# Patient Record
Sex: Female | Born: 1940 | Race: White | Hispanic: No | Marital: Married | Smoking: Never smoker
Health system: Southern US, Community
[De-identification: ages and names within clinical notes are randomized; demographics above are authoritative.]

## PROBLEM LIST (undated history)

## (undated) DIAGNOSIS — M545 Low back pain, unspecified: Secondary | ICD-10-CM

## (undated) DIAGNOSIS — I10 Essential (primary) hypertension: Secondary | ICD-10-CM

## (undated) DIAGNOSIS — IMO0002 Reserved for concepts with insufficient information to code with codable children: Secondary | ICD-10-CM

## (undated) DIAGNOSIS — E0500 Thyrotoxicosis with diffuse goiter without thyrotoxic crisis or storm: Secondary | ICD-10-CM

## (undated) DIAGNOSIS — IMO0001 Reserved for inherently not codable concepts without codable children: Secondary | ICD-10-CM

## (undated) DIAGNOSIS — M810 Age-related osteoporosis without current pathological fracture: Secondary | ICD-10-CM

## (undated) DIAGNOSIS — E559 Vitamin D deficiency, unspecified: Secondary | ICD-10-CM

## (undated) DIAGNOSIS — C55 Malignant neoplasm of uterus, part unspecified: Secondary | ICD-10-CM

## (undated) DIAGNOSIS — M419 Scoliosis, unspecified: Secondary | ICD-10-CM

## (undated) DIAGNOSIS — I509 Heart failure, unspecified: Secondary | ICD-10-CM

## (undated) HISTORY — DX: Essential (primary) hypertension: I10

## (undated) HISTORY — DX: Low back pain, unspecified: M54.50

## (undated) HISTORY — DX: Vitamin D deficiency, unspecified: E55.9

## (undated) HISTORY — DX: Scoliosis, unspecified: M41.9

## (undated) HISTORY — DX: Thyrotoxicosis with diffuse goiter without thyrotoxic crisis or storm: E05.00

## (undated) HISTORY — PX: BUNIONECTOMY: SHX129

## (undated) HISTORY — PX: JOINT REPLACEMENT: SHX530

## (undated) HISTORY — DX: Age-related osteoporosis without current pathological fracture: M81.0

## (undated) HISTORY — DX: Malignant neoplasm of uterus, part unspecified: C55

## (undated) HISTORY — PX: TOTAL KNEE ARTHROPLASTY: SHX125

## (undated) HISTORY — PX: CATARACT EXTRACTION, BILATERAL: SHX1313

---
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
## 2002-08-03 HISTORY — PX: FOOT SURGERY: SHX648

## 2003-04-14 ENCOUNTER — Encounter: Payer: Self-pay | Admitting: Emergency Medicine

## 2003-04-14 ENCOUNTER — Emergency Department (HOSPITAL_COMMUNITY): Admission: EM | Admit: 2003-04-14 | Discharge: 2003-04-14 | Payer: Self-pay | Admitting: Emergency Medicine

## 2007-07-21 ENCOUNTER — Other Ambulatory Visit: Admission: RE | Admit: 2007-07-21 | Discharge: 2007-07-21 | Payer: Self-pay | Admitting: Internal Medicine

## 2007-08-18 ENCOUNTER — Encounter: Admission: RE | Admit: 2007-08-18 | Discharge: 2007-08-18 | Payer: Self-pay | Admitting: Internal Medicine

## 2007-11-08 ENCOUNTER — Ambulatory Visit (HOSPITAL_COMMUNITY): Admission: RE | Admit: 2007-11-08 | Discharge: 2007-11-08 | Payer: Self-pay | Admitting: Family Medicine

## 2008-02-09 ENCOUNTER — Ambulatory Visit (HOSPITAL_BASED_OUTPATIENT_CLINIC_OR_DEPARTMENT_OTHER): Admission: RE | Admit: 2008-02-09 | Discharge: 2008-02-09 | Payer: Self-pay | Admitting: Orthopedic Surgery

## 2008-09-12 ENCOUNTER — Encounter: Admission: RE | Admit: 2008-09-12 | Discharge: 2008-09-12 | Payer: Self-pay | Admitting: Internal Medicine

## 2009-04-12 ENCOUNTER — Inpatient Hospital Stay (HOSPITAL_COMMUNITY): Admission: RE | Admit: 2009-04-12 | Discharge: 2009-04-15 | Payer: Self-pay | Admitting: Orthopedic Surgery

## 2009-10-01 ENCOUNTER — Encounter: Admission: RE | Admit: 2009-10-01 | Discharge: 2009-10-01 | Payer: Self-pay | Admitting: Internal Medicine

## 2010-08-03 DIAGNOSIS — J189 Pneumonia, unspecified organism: Secondary | ICD-10-CM

## 2010-08-03 HISTORY — DX: Pneumonia, unspecified organism: J18.9

## 2010-11-07 LAB — BASIC METABOLIC PANEL
BUN: 6 mg/dL (ref 6–23)
Calcium: 8.2 mg/dL — ABNORMAL LOW (ref 8.4–10.5)
Calcium: 8.2 mg/dL — ABNORMAL LOW (ref 8.4–10.5)
Creatinine, Ser: 0.77 mg/dL (ref 0.4–1.2)
GFR calc Af Amer: >60 mL/min (ref >60)
GFR calc non Af Amer: >60 mL/min (ref >60)
GFR calc non Af Amer: >60 mL/min (ref >60)
Glucose, Bld: 127 mg/dL — ABNORMAL HIGH (ref 70–99)
Sodium: 136 mEq/L (ref 135–145)

## 2010-11-07 LAB — CBC
HCT: 27.2 % — ABNORMAL LOW (ref 36.0–46.0)
Hemoglobin: 10.5 g/dL — ABNORMAL LOW (ref 12.0–15.0)
MCHC: 33.9 g/dL (ref 30.0–36.0)
MCV: 94.5 fL (ref 78.0–100.0)
Platelets: 159 10*3/uL (ref 150–400)
Platelets: 191 10*3/uL (ref 150–400)
Platelets: 202 10*3/uL (ref 150–400)
RBC: 3.24 MIL/uL — ABNORMAL LOW (ref 3.87–5.11)
RDW: 12.9 % (ref 11.5–15.5)
WBC: 11 10*3/uL — ABNORMAL HIGH (ref 4.0–10.5)
WBC: 11.2 10*3/uL — ABNORMAL HIGH (ref 4.0–10.5)
WBC: 8 10*3/uL (ref 4.0–10.5)

## 2010-11-07 LAB — URINALYSIS, ROUTINE W REFLEX MICROSCOPIC
Hgb urine dipstick: NEGATIVE
Specific Gravity, Urine: 1.01 (ref 1.005–1.030)
Urobilinogen, UA: 0.2 mg/dL (ref 0.0–1.0)

## 2010-11-07 LAB — PROTIME-INR
INR: 1.5 (ref 0.00–1.49)
INR: 1.6 — ABNORMAL HIGH (ref 0.00–1.49)
Prothrombin Time: 14.5 seconds (ref 11.6–15.2)
Prothrombin Time: 19 seconds — ABNORMAL HIGH (ref 11.6–15.2)

## 2010-11-07 LAB — COMPREHENSIVE METABOLIC PANEL
AST: 21 U/L (ref 0–37)
Albumin: 3.6 g/dL (ref 3.5–5.2)
Calcium: 9.5 mg/dL (ref 8.4–10.5)
Chloride: 105 mEq/L (ref 96–112)
Creatinine, Ser: 0.79 mg/dL (ref 0.4–1.2)
GFR calc Af Amer: >60 mL/min (ref >60)
Sodium: 142 mEq/L (ref 135–145)

## 2010-11-07 LAB — APTT: aPTT: 27 seconds (ref 24–37)

## 2010-11-07 LAB — ABO/RH: ABO/RH(D): O POS

## 2010-11-07 LAB — TYPE AND SCREEN: Antibody Screen: NEGATIVE

## 2010-11-07 LAB — URINE MICROSCOPIC-ADD ON

## 2010-11-24 ENCOUNTER — Other Ambulatory Visit: Payer: Self-pay | Admitting: Internal Medicine

## 2010-11-24 DIAGNOSIS — Z1231 Encounter for screening mammogram for malignant neoplasm of breast: Secondary | ICD-10-CM

## 2010-12-03 ENCOUNTER — Ambulatory Visit
Admission: RE | Admit: 2010-12-03 | Discharge: 2010-12-03 | Disposition: A | Discharge status: Home / Self Care | Payer: BC Managed Care – PPO | Source: Ambulatory Visit | Attending: Internal Medicine | Admitting: Internal Medicine

## 2010-12-03 DIAGNOSIS — Z1231 Encounter for screening mammogram for malignant neoplasm of breast: Secondary | ICD-10-CM

## 2010-12-16 NOTE — Op Note (Signed)
NAME:  Terri Ortega, Terri Ortega               ACCOUNT NO.:  [id number]   MEDICAL RECORD NO.:  [id number]          PATIENT TYPE:  AMB   LOCATION:  [facility]                         FACILITY:  [facility]   PHYSICIAN:  Deidre Ala, M.D.    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 Degenerative medial meniscus tear, grade 3-4, unstable with flap      mid substance.  2. Degenerative lateral meniscus tear.  3. 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  4. Tight lateral retinaculum.  5. Medial and lateral plicas.   PROCEDURES:  1. Right knee arthroscopy with subtotal medial meniscectomy.  2. Partial lateral meniscectomy.  3. Abrasion and ablation chondroplasty, tricompartmental.  4. Lateral retinacular release.  5. Medial and lateral plica excision.   SURGEON:  1. Charlesetta Shanks, M.D.   ASSISTANT:  Phineas Semen, P.A.   ANESTHESIA:  General with LMA.   CULTURES:  None.   DRAINS:  None.   BLOOD LOSS:  Minimal.   TOURNIQUET TIME:  47 minutes.   PATHOLOGIC FINDINGS AND HISTORY:  [name] was sent for consultation by  Dr. Althea Charon in our office for chronic right knee pain, some catching  and giving way.  She had evidence of lateral degeneration of the patella  on sunrise view.  The MRI showed a horizontal tear of the anterior horn  lateral meniscus, tear of the posterior horn medial meniscus, and  patellar chondromalacia.  We talked about various options for treatment.  She was having mechanical symptoms, so it was elected to proceed with  surgical intervention with arthroscopy.  At surgery, she had a huge  medial plica.  The entire trochlea was involved with grade 2-3 changes.  The entire patella was grade 3-4.  She had a tight lateral retinaculum.  She had a flipped-in flap of a medial meniscus tear that was multiple  tear configuration stellate from front one-third to back with DJD grade  3-4 on the medial tibial plateau and medial femoral condyle.  All of  this was debrided and smoothed.  The ACL was intact.  The lateral  meniscus had marked degenerative fraying and anterior horn tearing with  some horizontal tearing in the mid substance that we debrided back to a  stable rim from front to back and debrided the lateral compartment using  the ablator on one to smooth.  She had a tight lateral retinaculum with  lateral patellar tilt and track, and this was where a lot of her wear  was in the trochlea and posterior patella.  This released nicely with  improved tilt and track.   DESCRIPTION OF PROCEDURE:  With adequate anesthesia obtained using LMA  technique, 1 gram Ancef given IV prophylaxis, the patient was placed in  the supine position.  The right lower extremity was prepped from the  malleoli to the leg-holder in the standard fashion.  After standard  prepping and draping, Esmarch examination was used.  The tourniquet was  let up to 350 mmHg.  Superior and lateral inflow portals were made.  The  knee was insufflated with normal saline with an arthroscopic pump.  Medial and lateral scope portals were then made and the joint was  thoroughly inspected.  I then shaved out the medial plica back to the  sidewall and lysed the medial band.  I then shaved and smoothed with the  ablator on one the trochlea and most of the posterior patella.  I then  exposed the medial meniscus which I probed, flipped in the flipped-out  tear that was flipped over the meniscus back into the joint, and  completed with basket and shaver from various angles meniscectomy from  the anterior one-third to the far posterior horn, leaving some posterior  horn but excising most of the meniscus in the middle one-third using the  ablator on one to smooth as well as  shaving the medial femoral condyle,  medial tibial plateau, and using the ablator on one to smooth.  The  lateral compartment was then smoothed and shaved with basket and shaver.  We then sealed the inner rim lateral meniscus and the anterior horn and  shaved and smoothed with the ablator over the lateral femoral condyle  and lateral tibial plateau.  I then shaved out the lateral synovitis and  did an arthroscopic lateral retinacular release from the vastus  lateralis to the joint line with improvement in tilt and track with  bleeding points cauterized.  The knee was irrigated through the scope.  0.5% Marcaine with morphine was injected in and about the portals.  The  portals were left open.  A bulky sterile compressive dressing was  applied with lateral foam pad for tamponade and an EZ wrap placed.  The  patient then, having tolerated the procedure well, was awakened and  taken to the recovery room in satisfactory condition to be discharged  per outpatient routine, given Percocet for pain, and told to call the  office for a recheck tomorrow.           ______________________________  V. Charlesetta Shanks, M.D.     VEP/MEDQ  D:  02/09/2008  T:  02/09/2008  Job:  [id number]   cc:   Deidre Ala, M.D.  Fax: [phone]   Otho Darner, M.D.  Fax: [phone]

## 2011-04-30 LAB — POCT HEMOGLOBIN-HEMACUE
Hemoglobin: 13.2
Operator id: 268271

## 2012-01-04 ENCOUNTER — Other Ambulatory Visit: Payer: Self-pay | Admitting: Internal Medicine

## 2012-01-04 DIAGNOSIS — Z1231 Encounter for screening mammogram for malignant neoplasm of breast: Secondary | ICD-10-CM

## 2012-01-18 ENCOUNTER — Ambulatory Visit
Admission: RE | Admit: 2012-01-18 | Discharge: 2012-01-18 | Disposition: A | Discharge status: Home / Self Care | Payer: Medicare Other | Source: Ambulatory Visit | Attending: Internal Medicine | Admitting: Internal Medicine

## 2012-01-18 DIAGNOSIS — Z1231 Encounter for screening mammogram for malignant neoplasm of breast: Secondary | ICD-10-CM

## 2012-01-21 ENCOUNTER — Other Ambulatory Visit: Payer: Self-pay | Admitting: Internal Medicine

## 2012-01-21 DIAGNOSIS — R928 Other abnormal and inconclusive findings on diagnostic imaging of breast: Secondary | ICD-10-CM

## 2013-01-16 ENCOUNTER — Other Ambulatory Visit: Payer: Self-pay

## 2013-01-16 DIAGNOSIS — Z1231 Encounter for screening mammogram for malignant neoplasm of breast: Secondary | ICD-10-CM

## 2013-02-21 ENCOUNTER — Ambulatory Visit
Admission: RE | Admit: 2013-02-21 | Discharge: 2013-02-21 | Disposition: A | Discharge status: Home / Self Care | Payer: Medicare Other | Source: Ambulatory Visit

## 2013-02-21 DIAGNOSIS — Z1231 Encounter for screening mammogram for malignant neoplasm of breast: Secondary | ICD-10-CM

## 2013-12-12 ENCOUNTER — Other Ambulatory Visit (HOSPITAL_COMMUNITY): Payer: Self-pay | Admitting: Internal Medicine

## 2013-12-12 ENCOUNTER — Ambulatory Visit (HOSPITAL_COMMUNITY): Payer: Medicare Other | Attending: Cardiology | Admitting: Radiology

## 2013-12-12 DIAGNOSIS — R0602 Shortness of breath: Secondary | ICD-10-CM

## 2013-12-12 DIAGNOSIS — R0609 Other forms of dyspnea: Secondary | ICD-10-CM | POA: Insufficient documentation

## 2013-12-12 DIAGNOSIS — R0989 Other specified symptoms and signs involving the circulatory and respiratory systems: Principal | ICD-10-CM | POA: Insufficient documentation

## 2013-12-12 NOTE — Progress Notes (Signed)
Echocardiogram performed.  

## 2013-12-13 ENCOUNTER — Encounter: Payer: Self-pay | Admitting: Sports Medicine

## 2013-12-13 ENCOUNTER — Ambulatory Visit (INDEPENDENT_AMBULATORY_CARE_PROVIDER_SITE_OTHER): Payer: Medicare Other | Admitting: Sports Medicine

## 2013-12-13 VITALS — BP 124/81

## 2013-12-13 DIAGNOSIS — M5430 Sciatica, unspecified side: Secondary | ICD-10-CM

## 2013-12-13 DIAGNOSIS — M5137 Other intervertebral disc degeneration, lumbosacral region: Secondary | ICD-10-CM | POA: Insufficient documentation

## 2013-12-13 DIAGNOSIS — M5441 Lumbago with sciatica, right side: Secondary | ICD-10-CM

## 2013-12-13 MED ORDER — TRAMADOL HCL 50 MG PO TABS: 50.0000 mg | ORAL_TABLET | Freq: Two times a day (BID) | ORAL | Status: DC

## 2013-12-13 MED ORDER — GABAPENTIN 100 MG PO CAPS: ORAL_CAPSULE | ORAL | Status: DC

## 2013-12-13 NOTE — Assessment & Plan Note (Signed)
Limited stability w walking Suggested tring a cane  Work on chair rehab stretches  Demonstrated to patient Work on easy back motion  I would like to review MRI and spine films but I do not think this is surgical

## 2013-12-13 NOTE — Assessment & Plan Note (Signed)
We will treat pain with tramadol Trial of low dose gabapentin for sciatic sxs Cautions advised  OK to continue some aleve  Reck p 8 wks

## 2013-12-13 NOTE — Progress Notes (Signed)
Patient ID: Terri Ortega, female   DOB: 1941-02-17, 73 y.o.   MRN: [id number]  Patient with 6 to 8 mos of LBP She comes with her daughter on the advice of Dr. Harrington Challenger a family physician friend Radiation into RT leg ant and post down past knee Hx of RT TKR in 2010 Also arthroscopy She has had some significant evaluations and treatment at orthopedics and neurosurgery  Found to have scoliosis Has seen NS and had back injections First CSI did not help much Second 1 helped for 1 month IM CSI helped minimally  Has daily pain into RT leg More along anterior thigh and into buttocks No Hx of DM or other major medical issues  Takes syndthroid  Exam Pleasant older F in NAD BP 124/81  Walks with obvious limp and weakness on left hip flexion and leg extension Weakness to testing of hip flexion and hip abduction on RT Mils quad weakness Good strenth on PF, inversion, great toe motions  Standing has scoliosis in lower thoracic and lumbar region Lateral bend painful to RT/ tight to left Forward flexion is normal Extension is limited Rotation causes pain on RT  No MM spasm noted in low back

## 2013-12-13 NOTE — Patient Instructions (Signed)
I think you need to take tramadol for pain twice daily Primary side effect with medicine might be constipation so take with plenty of water This is for arthritis pain and over the long run is safer than Aleve  OK to use some aleve  For nerve irritation we will start very low dose gabapentin 100 mgm at night for 1 week 200 mgm at night for 2 week Then 300 mgm at night until you see me  This could make you drowsy or felt drunk so be careful if you get up in night  Exercise  Chair stretches  Gym exercise particularly the stationary bike  See me again in 6 weeks

## 2013-12-26 ENCOUNTER — Other Ambulatory Visit: Payer: Self-pay | Admitting: *Deleted

## 2013-12-26 MED ORDER — GABAPENTIN 100 MG PO CAPS: ORAL_CAPSULE | ORAL | Status: DC

## 2013-12-29 ENCOUNTER — Other Ambulatory Visit: Payer: Self-pay | Admitting: Obstetrics & Gynecology

## 2014-01-09 ENCOUNTER — Encounter: Payer: Self-pay | Admitting: *Deleted

## 2014-01-11 ENCOUNTER — Encounter: Payer: Self-pay | Admitting: Cardiology

## 2014-01-11 ENCOUNTER — Ambulatory Visit (INDEPENDENT_AMBULATORY_CARE_PROVIDER_SITE_OTHER): Payer: Medicare Other | Admitting: Cardiology

## 2014-01-11 VITALS — BP 132/70 | HR 64 | Ht 65.0 in | Wt 178.5 lb

## 2014-01-11 DIAGNOSIS — R0600 Dyspnea, unspecified: Secondary | ICD-10-CM

## 2014-01-11 DIAGNOSIS — I10 Essential (primary) hypertension: Secondary | ICD-10-CM

## 2014-01-11 DIAGNOSIS — R0989 Other specified symptoms and signs involving the circulatory and respiratory systems: Secondary | ICD-10-CM

## 2014-01-11 DIAGNOSIS — R609 Edema, unspecified: Secondary | ICD-10-CM

## 2014-01-11 DIAGNOSIS — R0609 Other forms of dyspnea: Secondary | ICD-10-CM

## 2014-01-11 NOTE — Patient Instructions (Signed)
Your physician wants you to follow-up in: [place] DR. HOCHREIN You will receive a reminder letter in the mail two months in advance. If you don't receive a letter, please call our office to schedule the follow-up appointment.  Your physician recommends that you continue on your current medications as directed. Please refer to the Current Medication list given to you today.

## 2014-01-11 NOTE — Progress Notes (Signed)
HPI The patient presents for evaluation of dyspnea and edema. She has no prior cardiac history. This may she did have some chest discomfort. She is somewhat vague in describing this. There was some shortness of breath. As a bandlike discomfort that was mild. She did not describe associated symptoms such as nausea vomiting or diaphoresis. She's not able to reproduce this. She has had some mild increased dyspnea but she's able to do activities such as vacuuming without bringing this on. She had some mild lower externally swelling. She did get a BNP which was slightly elevated. She was sent for an echocardiogram and I reviewed these results. The EF was well-preserved although there was some evidence mild diastolic dysfunction. There was also mild aortic valve thickening with some aortic insufficiency. She does not describe PND or orthopnea. She did not have neck discomfort or arm discomfort. He does remain active typically achieving greater than 5 METS.  Of note the patient was started on diuretic and she did have improvement in symptoms reduced ankle edema and her BNP normalized.  No Known Allergies  Current Outpatient Prescriptions  Medication Sig Dispense Refill  . furosemide (LASIX) 40 MG tablet Take 20 mg by mouth daily.       Marland Kitchen gabapentin (NEURONTIN) 100 MG capsule Take 1 tablet qhs x 1 week then increase to 2 qhs x 1 week then 3 qhs x 1 week  90 capsule  3  . potassium chloride (K-DUR) 10 MEQ tablet Take 10 mEq by mouth every other day.       . raloxifene (EVISTA) 60 MG tablet       . SYNTHROID 112 MCG tablet       . Vitamin D, Ergocalciferol, (DRISDOL) 50000 UNITS CAPS capsule Take 2,000 Units by mouth daily.        No current facility-administered medications for this visit.    Past Medical History  Diagnosis Date  . HTN (hypertension)   . Hypothyroid   . Graves disease   . Osteoporosis   . Uterine cancer   . Scoliosis     Past Surgical History  Procedure Laterality Date  .  Total knee arthroplasty      right  . Bunionectomy      left    No family history on file.  History   Social History  . Marital Status: Married    Spouse Name: N/A    Number of Children: N/A  . Years of Education: N/A   Occupational History  . Not on file.   Social History Main Topics  . Smoking status: Never Smoker   . Smokeless tobacco: Not on file  . Alcohol Use: Not on file  . Drug Use: Not on file  . Sexual Activity: Not on file   Other Topics Concern  . Not on file   Social History Narrative  . No narrative on file     ROS:   Positive for recent GI bleeding. Positive for joint pains. Positive for ankle swelling and low back pain. Otherwise, as stated in the HPI and negative for all other systems.  PHYSICAL EXAM BP 132/70  Pulse 64  Ht 5\' 5"  (1.651 m)  Wt 178 lb 8 oz (80.967 kg)  BMI 29.70 kg/m2  SpO2 97% GENERAL:  Well appearing HEENT:  Pupils equal round and reactive, fundi not visualized, oral mucosa unremarkable NECK:  No jugular venous distention, waveform within normal limits, carotid upstroke brisk and symmetric, no bruits, no thyromegaly LYMPHATICS:  No  cervical, inguinal adenopathy LUNGS:  Clear to auscultation bilaterally BACK:  No CVA tenderness CHEST:  Unremarkable HEART:  PMI not displaced or sustained,S1 and S2 within normal limits, no S3, no S4, no clicks, no rubs, soft apical systolic murmur, no diastolic murmurs ABD:  Flat, positive bowel sounds normal in frequency in pitch, no bruits, no rebound, no guarding, no midline pulsatile mass, no hepatomegaly, no splenomegaly EXT:  2 plus pulses throughout, no edema, no cyanosis no clubbing SKIN:  No rashes no nodules NEURO:  Cranial nerves II through XII grossly intact, motor grossly intact throughout PSYCH:  Cognitively intact, oriented to person place and time  EKG:  Normal sinus rhythm, rate 71, left axis deviation, left anterior fascicular block, poor anterior R wave progression, premature  ectopic complexes, nonspecific diffuse T wave flattening, QTC slightly prolonged. 01/11/2014  ASSESSMENT AND PLAN  DYSPNEA:  There probably is some element of mild diastolic dysfunction. However, her mild valvular disease does not require further treatment. She needs good blood pressure control and we discussed at length salt restriction. I would not see any evidence of an anginal equivalent.  For now she can continue low-dose diuretic. She may be able to back off on this and should reduce his salt.  HTN:  The blood pressure is at target. No change in medications is indicated. We will continue with therapeutic lifestyle changes (TLC).  AORTIC VALVE DISEASE:  This is mild. I would likely repeat an echocardiogram in one year.

## 2014-01-15 ENCOUNTER — Telehealth: Payer: Self-pay | Admitting: Cardiology

## 2014-01-15 NOTE — Telephone Encounter (Signed)
New Message  Terri Ortega with [facility] called states the Pt will have a hysterectomy on june 30th// She says that there was an ECHO completed on May 29 that was normal. Please confirm cardiac clearance if possible for this pt//

## 2014-01-15 NOTE — Telephone Encounter (Signed)
Will forward to Dr Hochrein for review and clearance 

## 2014-01-17 NOTE — Telephone Encounter (Signed)
The patient has a high functional level.  She has no high risk findings or symptoms.  She is not going for a high risk procedure from a cardiovascular standpoint.  Therefore, based on ACC/AHA guidelines, the patient would be at acceptable risk for the planned procedure without further cardiovascular testing.

## 2014-01-18 NOTE — Telephone Encounter (Signed)
leftmessage for Gillian Scarce to return the call.  I need a fax number to send clearance to.

## 2014-01-24 ENCOUNTER — Encounter: Payer: Self-pay | Admitting: Sports Medicine

## 2014-01-24 ENCOUNTER — Ambulatory Visit (INDEPENDENT_AMBULATORY_CARE_PROVIDER_SITE_OTHER): Payer: Medicare Other | Admitting: Sports Medicine

## 2014-01-24 VITALS — BP 109/67 | Ht 64.0 in | Wt 179.6 lb

## 2014-01-24 DIAGNOSIS — M5441 Lumbago with sciatica, right side: Secondary | ICD-10-CM

## 2014-01-24 DIAGNOSIS — M5430 Sciatica, unspecified side: Secondary | ICD-10-CM

## 2014-01-24 MED ORDER — GABAPENTIN 100 MG PO CAPS: 300.0000 mg | ORAL_CAPSULE | Freq: Every day | ORAL | Status: DC

## 2014-01-24 NOTE — Progress Notes (Signed)
CC: Followup low back pain and right-sided sciatica HPI: Patient returns for followup today. She states that her left leg symptoms are less prominent. She is not really getting any radicular type pain down her right leg anymore she is taking the gabapentin 300 mg each bedtime. She did not tolerate the tramadol do to making her feel very sleepy. She continues to complain of pain right in the middle of her back in about L5-S1. She states that this is worse for the first couple minutes after she gets out of a chair and for about 30 minutes in the morning until she really gets moving. She is doing some stretches that she was given by Dr. Oneida Alar previously.  ROS: As above in the HPI. All other systems are stable or negative.  OBJECTIVE: APPEARANCE:  Patient in no acute distress.The patient appeared well nourished and normally developed. HEENT: No scleral icterus. Conjunctiva non-injected Resp: Non labored Skin: No rash MSK:  Low back: - Tenderness to palpation about L5-S1 - Strength equal and symmetric 5 out of 5 - Sensation intact to light touch with the exception of a small area at the superolateral aspect of the right knee but is normal and symmetric above and beyond this small area - She is noted to have some difficulty getting out of the chair and some gait instability with first getting started - Leg length equal on measurement - With gait evaluation and walking she is noted to have a Trendelenburg gait. Pelvis shifts significantly to the right. Left leg appears short  MSK [place]: Not performed   ASSESSMENT: #1. Low back pain, suspect largely axial and arthritic at this time with improved control of radicular features on the gabapentin   PLAN: #1. Heel lift with medial wedge added to left shoe with improvement in Trendelenburg gait #2. Continue gabapentin 300 mg each bedtime #3. Okay to take Aleve. Monitor blood pressure about 3 times per week. See if there are any alternative blood  pressure medications that might be more compliant with the Aleve. Monitor for stomach irritation #4. Try one half tramadol to see if this is less sedating.  Followup one month.

## 2014-01-24 NOTE — Patient Instructions (Addendum)
Thank you for coming in today  1. Try heel lift in left shoe. Thicker side goes to inside of shoe. 2. Continue gabapentin 300 mg at night 3. Okay to use Aleve. Get a blood pressure cuff and monitor 3x per week. Talk to your doctor about whether you need lasix or there might be an alternative so that you could use aleve. Monitor for stomach irritation. 4. Try 1/2 tramadol if you're hurting.  Followup in 1 month

## 2014-01-25 ENCOUNTER — Telehealth: Payer: Self-pay | Admitting: Cardiology

## 2014-01-25 NOTE — Telephone Encounter (Signed)
Gillian Scarce called back with fax # for surgical clearance [phone].

## 2014-01-26 NOTE — Telephone Encounter (Signed)
Faxed as requested

## 2014-01-29 ENCOUNTER — Telehealth: Payer: Self-pay | Admitting: Cardiology

## 2014-01-29 NOTE — Telephone Encounter (Signed)
Will fax to Dr Alycia Rossetti [phone]

## 2014-01-29 NOTE — Telephone Encounter (Signed)
Patient is inquiring about the letter we were going to fax to Dr. Alycia Rossetti in [facility]  Fax # [phone]-- clearing this patient for surgery.  Surgery is scheduled for tomorrow 01/30/14.  Has this been completed?  Please confirm with patient.

## 2014-01-30 DIAGNOSIS — C541 Malignant neoplasm of endometrium: Secondary | ICD-10-CM | POA: Insufficient documentation

## 2014-01-30 HISTORY — PX: ROBOTIC ASSISTED TOTAL HYSTERECTOMY WITH BILATERAL SALPINGO OOPHERECTOMY: SHX6086

## 2014-02-01 ENCOUNTER — Ambulatory Visit: Payer: Medicare Other | Admitting: Gynecologic Oncology

## 2014-02-13 ENCOUNTER — Other Ambulatory Visit: Payer: Self-pay | Admitting: *Deleted

## 2014-02-13 DIAGNOSIS — N950 Postmenopausal bleeding: Secondary | ICD-10-CM

## 2014-02-23 ENCOUNTER — Encounter: Payer: Self-pay | Admitting: Radiation Oncology

## 2014-02-23 NOTE — Progress Notes (Signed)
GYN Location of Tumor / Histology: Grade 1 endometrial cancer  Terri Ortega presented with symptoms of: post menopausal bleeding  Biopsies revealed:  01/30/14    Past/Anticipated interventions by Gyn/Onc surgery, if any: 01/30/14 - ROBOTIC ASSISTED TOTAL HYSTERECTOMY WITH BILATERAL SALPINGO OOPHERECTOMY by Dr. Alycia Rossetti  Past/Anticipated interventions by medical oncology, if any: no  Weight changes, if any: has lost 2 lbs due to nausea  Bowel/Bladder complaints, if any: no bowel issues, reports urinary frequency especially in the mornings.  Usually gets up once per night to urinate.  Nausea/Vomiting, if any:  Has occasional nausea.  Not taking any medication for it.  Pain issues, if any:  Has occasional shooting pain in her lower abdomin and left breast.  SAFETY ISSUES:  Prior radiation? no  Pacemaker/ICD? no  Possible current pregnancy? no  Is the patient on methotrexate? no  Current Complaints / other details:  Patient is here with her daughter.  She has 2 children - 1 deceased.  She is a retired Web designer.

## 2014-02-28 ENCOUNTER — Ambulatory Visit
Admission: RE | Admit: 2014-02-28 | Discharge: 2014-02-28 | Disposition: A | Discharge status: Home / Self Care | Payer: Medicare Other | Source: Ambulatory Visit | Attending: Radiation Oncology | Admitting: Radiation Oncology

## 2014-02-28 ENCOUNTER — Ambulatory Visit: Payer: Medicare Other | Admitting: Sports Medicine

## 2014-02-28 ENCOUNTER — Encounter: Payer: Self-pay | Admitting: Radiation Oncology

## 2014-02-28 VITALS — BP 118/91 | HR 72 | Temp 97.9°F | Ht 64.0 in | Wt 172.3 lb

## 2014-02-28 DIAGNOSIS — Z96659 Presence of unspecified artificial knee joint: Secondary | ICD-10-CM | POA: Insufficient documentation

## 2014-02-28 DIAGNOSIS — M810 Age-related osteoporosis without current pathological fracture: Secondary | ICD-10-CM | POA: Diagnosis not present

## 2014-02-28 DIAGNOSIS — Z90710 Acquired absence of both cervix and uterus: Secondary | ICD-10-CM | POA: Diagnosis not present

## 2014-02-28 DIAGNOSIS — C549 Malignant neoplasm of corpus uteri, unspecified: Secondary | ICD-10-CM | POA: Insufficient documentation

## 2014-02-28 DIAGNOSIS — I10 Essential (primary) hypertension: Secondary | ICD-10-CM | POA: Diagnosis not present

## 2014-02-28 DIAGNOSIS — C541 Malignant neoplasm of endometrium: Secondary | ICD-10-CM

## 2014-02-28 DIAGNOSIS — Z791 Long term (current) use of non-steroidal anti-inflammatories (NSAID): Secondary | ICD-10-CM | POA: Insufficient documentation

## 2014-02-28 DIAGNOSIS — Z7982 Long term (current) use of aspirin: Secondary | ICD-10-CM | POA: Diagnosis not present

## 2014-02-28 DIAGNOSIS — Z510 Encounter for antineoplastic radiation therapy: Secondary | ICD-10-CM | POA: Diagnosis present

## 2014-02-28 DIAGNOSIS — E039 Hypothyroidism, unspecified: Secondary | ICD-10-CM | POA: Insufficient documentation

## 2014-02-28 DIAGNOSIS — M4120 Other idiopathic scoliosis, site unspecified: Secondary | ICD-10-CM | POA: Diagnosis not present

## 2014-02-28 DIAGNOSIS — Z9079 Acquired absence of other genital organ(s): Secondary | ICD-10-CM | POA: Diagnosis not present

## 2014-02-28 NOTE — Progress Notes (Signed)
Please see the Nurse Progress Note in the MD Initial Consult Encounter for this patient. 

## 2014-02-28 NOTE — Progress Notes (Signed)
Radiation Oncology         [phone]) [phone] ________________________________  Initial outpatient Consultation  Name: Terri Ortega MRN: [id number]  Date: 02/28/2014  DOB: 1941/06/09  DP:OEUMPN,TIRWER D, MD  Polite, Ashby Dawes, MD   REFERRING PHYSICIAN: Polite, Ashby Dawes, MD  DIAGNOSIS: Stage IB, grade 1 endometrioid adenocarcinoma  HISTORY OF PRESENT ILLNESS:Terri Ortega is a 73 y.o. female who is who is seen out courtesy of Dr. Alycia Rossetti for an opinion concerning radiation therapy as part of management of patient's recently diagnosed endometrial cancer. Patient presented earlier this year with postmenopausal vaginal bleeding. She was seen by Dr. Dory Horn and curettage revealed endometrial adenocarcinoma. The patient was referred to Dr. Alycia Rossetti. Patient was felt to be a good candidate for robotic laparoscopy, surgical, with total hysterectomy with lymph node sampling and dissection. Due to to scheduling issues this surgery was performed at [facility] on June 5. The patient was found to have a FIGO grade 1 endometrioid adenocarcinoma, tumor was 3.2 cm in greatest dimension. The tumor did extend into the outer half of the myometrium for penetration of 13 mm and wall thickness 20 mm (65%). There was no cervical involvement or serosal involvement. The surgical margins were clear. No lymphovascular space invasion was noted. A total of 14 lymph nodes from the pelvis and periaortic area showed no evidence of metastasis. Patient is been doing well since her surgery. She was presented at the multidisciplinary gynecological oncology conference at [facility] July 8. Recommendations were for adjuvant vaginal brachytherapy. The patient does live in the [place] area and has been referred to our facility for treatment.  PREVIOUS RADIATION THERAPY: No  PAST MEDICAL HISTORY:  has a past medical history of HTN (hypertension); Hypothyroid; Graves disease; Osteoporosis; Uterine cancer; and Scoliosis.     PAST SURGICAL HISTORY: Past Surgical History  Procedure Laterality Date  . Total knee arthroplasty      right  . Bunionectomy      left  . Robotic assisted total hysterectomy with bilateral salpingo oopherectomy  01/30/14    with lymph node biopsy    FAMILY HISTORY: family history is not on file.  SOCIAL HISTORY:  reports that she has never smoked. She does not have any smokeless tobacco history on file.  ALLERGIES: Review of patient's allergies indicates no known allergies.  MEDICATIONS:  Current Outpatient Prescriptions  Medication Sig Dispense Refill  . aspirin EC 81 MG tablet Take 81 mg by mouth daily.       . Cholecalciferol (VITAMIN D3) 2000 UNITS capsule Take by mouth daily.       . furosemide (LASIX) 40 MG tablet Take 20 mg by mouth daily.       Marland Kitchen levothyroxine (SYNTHROID) 112 MCG tablet Take by mouth daily before breakfast.       . naproxen sodium (ANAPROX) 220 MG tablet Take 220 mg by mouth 2 (two) times daily with a meal.      . Omega-3 1000 MG CAPS Take by mouth.      . potassium chloride (K-DUR) 10 MEQ tablet Take 10 mEq by mouth every other day.       . raloxifene (EVISTA) 60 MG tablet daily.        No current facility-administered medications for this encounter.    REVIEW OF SYSTEMS:  A 15 point review of systems is documented in the electronic medical record. This was obtained by the nursing staff. However, I reviewed this with the patient to discuss relevant  findings and make appropriate changes. The patient presented with vaginal bleeding as above. At times this was heavy. She denies any pelvic pain, urination symptoms or bowel complaints. She denies any new problems since her surgery.   PHYSICAL EXAM:  height is 5\' 4"  (1.626 m) and weight is 172 lb 4.8 oz (78.155 kg). Her oral temperature is 97.9 F (36.6 C). Her blood pressure is 118/91 and her pulse is 72.   BP 118/91  Pulse 72  Temp(Src) 97.9 F (36.6 C) (Oral)  Ht 5\' 4"  (1.626 m)  Wt 172 lb 4.8 oz  (78.155 kg)  BMI 29.56 kg/m2  General Appearance:    Alert, cooperative, no distress, appears stated age, accompanied by a daughter on evaluation today   Head:    Normocephalic, without obvious abnormality, atraumatic  Eyes:    PERRL, conjunctiva/corneas clear, EOM's intact,      Ears:    Normal TM's and external ear canals, both ears  Nose:   Nares normal, septum midline, mucosa normal, no drainage    or sinus tenderness  Throat:   Lips, mucosa, and tongue normal; teeth and gums normal  Neck:   Supple, symmetrical, trachea midline, no adenopathy;    thyroid:  no enlargement/tenderness/nodules; no carotid   bruit or JVD  Back:     Symmetric, no curvature, ROM normal, no CVA tenderness  Lungs:     Clear to auscultation bilaterally, respirations unlabored  Chest Wall:    No tenderness or deformity   Heart:    Regular rate and rhythm, S1 and S2 normal, no murmur, rub   or gallop     Abdomen:     Soft, non-tender, bowel sounds active all four quadrants,    no masses, no organomegaly, small scars from recent laparoscopic surgery   Genitalia:   deferred until simulation and planning day      Extremities:   Extremities normal, atraumatic, no cyanosis or edema  Pulses:   2+ and symmetric all extremities  Skin:   Skin color, texture, turgor normal, no rashes or lesions  Lymph nodes:   Cervical, supraclavicular, and axillary nodes normal  Neurologic:   normal strength, sensation and reflexes    throughout     ECOG = 1    1 - Symptomatic but completely ambulatory (Restricted in physically strenuous activity but ambulatory and able to carry out work of a light or sedentary nature. For example, light housework, office work)   LABORATORY DATA:  Lab Results  Component Value Date   WBC 11.2* 04/15/2009   HGB 9.3* 04/15/2009   HCT 27.2* 04/15/2009   MCV 94.2 04/15/2009   PLT 159 04/15/2009   Lab Results  Component Value Date   NA 136 04/14/2009   K 3.5 04/14/2009   CL 103 04/14/2009   CO2  30 04/14/2009   GLUCOSE 127* 04/14/2009   CREATININE 0.74 04/14/2009   CALCIUM 8.2* 04/14/2009      RADIOGRAPHY: No results found.    IMPRESSION:  Stage IB, grade 1 endometrioid adenocarcinoma.  The patient would be a good candidate for vaginal vault brachytherapy. I discussed the treatment course side effects and potential toxicities of radiation therapy in this situation with the patient and her daughter. Patient appears to understand and wishes to proceed with planned course of treatment. The patient and daughter are quite relieved today as their assumption was that the patient had stage III disease requiring more extensive treatment.  PLAN: Simulation and planning, mid-to-late August. The patient is  scheduled for examination by Dr. Alycia Rossetti in early August to ensure adequate healing of the vaginal cuff.  I spent 60 minutes minutes face to face with the patient and more than 50% of that time was spent in counseling and/or coordination of care.   ------------------------------------------------  Blair Promise, PhD, MD

## 2014-03-05 ENCOUNTER — Other Ambulatory Visit: Payer: Self-pay

## 2014-03-05 DIAGNOSIS — Z1231 Encounter for screening mammogram for malignant neoplasm of breast: Secondary | ICD-10-CM

## 2014-03-06 ENCOUNTER — Telehealth: Payer: Self-pay | Admitting: *Deleted

## 2014-03-06 NOTE — Addendum Note (Signed)
Encounter addended by: Jacqulyn Liner, RN on: 03/06/2014  3:25 PM<BR>     Documentation filed: Visit Diagnoses

## 2014-03-06 NOTE — Telephone Encounter (Signed)
CALLED PATIENT TO INFORM OF HDR CASE ON 03-20-14, SPOKE WITH PATIENT AND SHE IS AWARE OF THESE APPTS.

## 2014-03-08 ENCOUNTER — Ambulatory Visit: Payer: Medicare Other | Attending: Gynecologic Oncology | Admitting: Gynecologic Oncology

## 2014-03-08 ENCOUNTER — Encounter: Payer: Self-pay | Admitting: Gynecologic Oncology

## 2014-03-08 VITALS — BP 152/75 | HR 70 | Temp 98.3°F | Resp 16 | Ht 64.0 in | Wt 173.1 lb

## 2014-03-08 DIAGNOSIS — Z9079 Acquired absence of other genital organ(s): Secondary | ICD-10-CM | POA: Insufficient documentation

## 2014-03-08 DIAGNOSIS — Z90710 Acquired absence of both cervix and uterus: Secondary | ICD-10-CM | POA: Diagnosis not present

## 2014-03-08 DIAGNOSIS — Z8542 Personal history of malignant neoplasm of other parts of uterus: Secondary | ICD-10-CM | POA: Diagnosis present

## 2014-03-08 DIAGNOSIS — C549 Malignant neoplasm of corpus uteri, unspecified: Secondary | ICD-10-CM

## 2014-03-08 DIAGNOSIS — C541 Malignant neoplasm of endometrium: Secondary | ICD-10-CM

## 2014-03-08 DIAGNOSIS — M810 Age-related osteoporosis without current pathological fracture: Secondary | ICD-10-CM | POA: Insufficient documentation

## 2014-03-08 DIAGNOSIS — Z79899 Other long term (current) drug therapy: Secondary | ICD-10-CM | POA: Insufficient documentation

## 2014-03-08 DIAGNOSIS — I10 Essential (primary) hypertension: Secondary | ICD-10-CM | POA: Diagnosis not present

## 2014-03-08 DIAGNOSIS — Z7982 Long term (current) use of aspirin: Secondary | ICD-10-CM | POA: Insufficient documentation

## 2014-03-08 DIAGNOSIS — M4120 Other idiopathic scoliosis, site unspecified: Secondary | ICD-10-CM | POA: Diagnosis not present

## 2014-03-08 DIAGNOSIS — E0500 Thyrotoxicosis with diffuse goiter without thyrotoxic crisis or storm: Secondary | ICD-10-CM | POA: Insufficient documentation

## 2014-03-08 DIAGNOSIS — E039 Hypothyroidism, unspecified: Secondary | ICD-10-CM | POA: Diagnosis not present

## 2014-03-08 NOTE — Progress Notes (Signed)
HPI:  Terri Ortega is a 73 y.o. year old No obstetric history on file. initially seen in consultation in June, 2015 for grade 1 endometrial cancer.  She then underwent a robotic assisted total laparoscopic hysterectomy, bilateral salpingo-oophorectomy, pelvic and para-aortic lymphadenectomy (with SLN mapping) on 01/14/42 without complications.  Her postoperative course was uncomplicated.  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  She is seen today for a postoperative check and to discuss her pathology results and treatment plan.  Since discharge from the hospital, she is feeling well.  She has improving appetite, normal bowel and bladder function, and pain controlled with minimal PO medication. She has no other complaints today.  Interval Hx:  She was seen in consultation by Dr Sondra Come on 02/28/14 for consideration of vaginal brachytherapy for adjuvent therapy for high/intermediate risk factors.    Review of systems: Constitutional:  She has no weight gain or weight loss. She has no fever or chills. Eyes: No blurred vision Ears, Nose, Mouth, Throat: No dizziness, headaches or changes in hearing. No mouth sores. Cardiovascular: No chest pain, palpitations or edema. Respiratory:  No shortness of breath, wheezing or cough Gastrointestinal: She has normal bowel movements without diarrhea or constipation. She denies any nausea or vomiting. She denies blood in her stool or heart burn. Genitourinary:  She denies pelvic pain, pelvic pressure or changes in her urinary function. She has no hematuria, dysuria, or incontinence. She has no irregular vaginal bleeding or vaginal discharge Musculoskeletal: Denies muscle weakness or joint pains.  Skin:  She has no skin changes, rashes or itching Neurological:  Denies dizziness or headaches. No neuropathy, no numbness  or tingling. Psychiatric:  She denies depression or anxiety. Hematologic/Lymphatic:   No easy bruising or bleeding  No Known Allergies   Current Outpatient Prescriptions on File Prior to Visit  Medication Sig Dispense Refill  . aspirin EC 81 MG tablet Take 81 mg by mouth daily.       . Cholecalciferol (VITAMIN D3) 2000 UNITS capsule Take by mouth daily.       . furosemide (LASIX) 40 MG tablet Take 20 mg by mouth daily.       Marland Kitchen levothyroxine (SYNTHROID) 112 MCG tablet Take by mouth daily before breakfast.       . naproxen sodium (ANAPROX) 220 MG tablet Take 220 mg by mouth 2 (two) times daily with a meal.      . Omega-3 1000 MG CAPS Take by mouth.      . potassium chloride (K-DUR) 10 MEQ tablet Take 10 mEq by mouth every other day.       . raloxifene (EVISTA) 60 MG tablet daily.        No current facility-administered medications on file prior to visit.     Past Medical History  Diagnosis Date  . HTN (hypertension)   . Hypothyroid   . Graves disease   . Osteoporosis   . Uterine cancer   . Scoliosis    Past Surgical History  Procedure Laterality Date  . Total knee arthroplasty      right  . Bunionectomy      left  . Robotic assisted total hysterectomy with bilateral salpingo oopherectomy  01/30/14    with lymph node biopsy   History   Social History  . Marital Status: Married    Spouse Name: N/A    Number of  Children: 3  . Years of Education: N/A   Occupational History  . administrative assitant    Social History Main Topics  . Smoking status: Never Smoker   . Smokeless tobacco: Not on file  . Alcohol Use: No  . Drug Use: No  . Sexual Activity: Not Currently   Other Topics Concern  . Not on file   Social History Narrative  . No narrative on file   History reviewed. No pertinent family history.  Oncology History   Stage IB grade 1 endometrioid endometrial cancer treated with robotic staging on 01/30/14. Patient met high/intermed risk factors based on  outer half (65%) myometrial invasion in a woman older than 70. Dispositioned to receive adjuvant vaginal brachytherapy to reduce the risk for recurrence at the vaginal cuff.     Endometrial cancer   12/29/2013 Initial Diagnosis Endometrial cancer. Grade 1. Hysteroscopy D&C   01/30/2014 Surgery robotic hyst, bso, pelvic and PA node dissection at [facility] with Dr Nancy Marus     Physical Exam: Blood pressure 152/75, pulse 70, temperature 98.3 F (36.8 C), resp. rate 16, height '5\' 4"'  (1.626 m), weight 173 lb 1.6 oz (78.518 kg). General: Well dressed, well nourished in no apparent distress.   HEENT:  Normocephalic and atraumatic, no lesions.  Extraocular muscles intact. Sclerae anicteric. Pupils equal, round, reactive. No mouth sores or ulcers. Thyroid is normal size, not nodular, midline. Skin:  No lesions or rashes. Breasts:  Soft, symmetric.  No skin or nipple changes.  No palpable LN or masses. Lungs:  Clear to auscultation bilaterally.  No wheezes. Cardiovascular:  Regular rate and rhythm.  No murmurs or rubs. Abdomen:  Soft, nontender, nondistended.  No palpable masses.  No hepatosplenomegaly.  No ascites. Normal bowel sounds.  No hernias.  Incisions are completely healed. Genitourinary: Normal EGBUS  Vaginal cuff intact.  No bleeding or discharge.  No cul de sac fullness. Extremities: No cyanosis, clubbing or edema.  No calf tenderness or erythema. No palpable cords. Psychiatric: Mood and affect are appropriate. Neurological: Awake, alert and oriented x 3. Sensation is intact, no neuropathy.  Musculoskeletal: No pain, normal strength and range of motion.  Assessment:    73 y.o. 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   S/p robotic hysterectomy, BSO, lymphadenectomy on 01/30/14 in [place]. No LVSI, 65% myometrial invasion, negative pelvic washings and negative lymph nodes.   Plan: 1) Pathology reports reviewed today 2) Treatment counseling -  High/intermediate (12%) for recurrence given age, grade, depth of myometrial invasion and LVSI status. Multidisciplinary tumor board recommendation is for adjuvant vaginal brachytherapy which is associated with a significant reduction in risk for recurrence at the vaginal cuff. From a postoperative healing stand point she is cleared to commence vaginal brachytherapy on August 18th as planned. Following that treatment she will require close surveillance with examinations every 3 months x 2 years, then every 6 months for 3 more years, at which time she can return to annual visits.  Discussed signs and symptoms of recurrence including vaginal bleeding or discharge, leg pain or swelling and changes in bowel or bladder habits. She was given the opportunity to ask questions, which were answered to her satisfaction, and she is agreement with the above mentioned plan of care.  3)  Return to clinic in 4 months to see Dr Alycia Rossetti. Donaciano Eva, MD

## 2014-03-08 NOTE — Patient Instructions (Signed)
Follow up with Dr. Sondra Come Follow up with Dr. Alycia Rossetti after completion of radiation.

## 2014-03-09 ENCOUNTER — Encounter: Payer: Self-pay | Admitting: *Deleted

## 2014-03-09 NOTE — Progress Notes (Signed)
[facility] Psychosocial Distress Screening Clinical Social Work  Clinical Social Work was referred by distress screening protocol.  The patient scored a 5 on the Psychosocial Distress Thermometer which indicates moderate distress. Clinical Social Worker contacted patient by phone to assess for distress and other psychosocial needs. Terri Ortega indicated no needs and reports she is doing well.  CSW encouraged patient to contact CSW if needs arise.  ONCBCN DISTRESS SCREENING 02/28/2014  Screening Type Initial Screening  Mark the number that describes how much distress you have been experiencing in the past week 5  Emotional problem type Adjusting to illness  Information Concerns Type Lack of info about treatment;Lack of info about complementary therapy choices  Physical Problem type Nausea/vomiting  Physician notified of physical symptoms Yes    Clinical Social Worker follow up needed: No.  If yes, follow up plan:

## 2014-03-14 ENCOUNTER — Ambulatory Visit
Admission: RE | Admit: 2014-03-14 | Discharge: 2014-03-14 | Disposition: A | Discharge status: Home / Self Care | Payer: Medicare Other | Source: Ambulatory Visit

## 2014-03-14 DIAGNOSIS — Z1231 Encounter for screening mammogram for malignant neoplasm of breast: Secondary | ICD-10-CM

## 2014-03-15 ENCOUNTER — Ambulatory Visit: Payer: Medicare Other | Admitting: Sports Medicine

## 2014-03-16 ENCOUNTER — Telehealth: Payer: Self-pay | Admitting: *Deleted

## 2014-03-16 NOTE — Telephone Encounter (Signed)
Called patient to inform of appts. On 03-20-14 and her additional treatments on the other days, spoke with patient and she is aware of these appts., she is aware of the appt. Time changes.

## 2014-03-19 NOTE — Progress Notes (Signed)
Editor: Jacqulyn Liner, RN (Registered Nurse)      GYN Location of Tumor / Histology: Grade 1 endometrial cancer   Maida Sale Urenda presented with symptoms of: post menopausal bleeding   Biopsies revealed:  01/30/14    Past/Anticipated interventions by Gyn/Onc surgery, if any: 01/30/14 - ROBOTIC ASSISTED TOTAL HYSTERECTOMY WITH BILATERAL SALPINGO OOPHERECTOMY by Dr. Alycia Rossetti   Past/Anticipated interventions by medical oncology, if any: no   Weight changes, if any: no - reports a good appetitie  Bowel/Bladder complaints, if any: no bowel issues, reports urinary frequency especially in the mornings. Usually gets up once per night to urinate.   Nausea/Vomiting, if any: no  Pain issues, if any: has occasional discomfort in her lower abdomen  SAFETY ISSUES:  Prior radiation? no  Pacemaker/ICD? no  Possible current pregnancy? no  Is the patient on methotrexate? No  Current Complaints / other details:  She has 2 children - 1 deceased. She is a retired Web designer. Patient's heart rate is irregular.  When first taken it was 41, then 72 and 60 manually.  She denies feeling light headed.

## 2014-03-20 ENCOUNTER — Ambulatory Visit
Admission: RE | Admit: 2014-03-20 | Discharge: 2014-03-20 | Disposition: A | Discharge status: Home / Self Care | Payer: Medicare Other | Source: Ambulatory Visit | Attending: Radiation Oncology | Admitting: Radiation Oncology

## 2014-03-20 ENCOUNTER — Encounter: Payer: Self-pay | Admitting: Radiation Oncology

## 2014-03-20 VITALS — BP 152/48 | HR 60 | Temp 98.3°F | Ht 64.0 in | Wt 173.2 lb

## 2014-03-20 VITALS — HR 80

## 2014-03-20 DIAGNOSIS — Z4889 Encounter for other specified surgical aftercare: Secondary | ICD-10-CM | POA: Diagnosis present

## 2014-03-20 DIAGNOSIS — C541 Malignant neoplasm of endometrium: Secondary | ICD-10-CM

## 2014-03-20 DIAGNOSIS — C549 Malignant neoplasm of corpus uteri, unspecified: Secondary | ICD-10-CM | POA: Insufficient documentation

## 2014-03-20 DIAGNOSIS — Z510 Encounter for antineoplastic radiation therapy: Secondary | ICD-10-CM | POA: Diagnosis not present

## 2014-03-20 NOTE — Progress Notes (Signed)
   Department of Radiation Oncology  Phone:  [phone] Fax:        [phone]  High-dose-rate brachytherapy procedure note   After planning was complete the patient was transferred to the high dose rate suite. She was placed in the dorsal lithotomy position. The vaginal cylinder was reinserted without difficulty. This was affixed to the CT/MR stabilization plate to prevent slippage.  Verification simulation  A fiducial marker was placed within the vaginal cylinder. An AP and lateral film was obtained in the treatment position. This was compared to planning films earlier in the day documenting good position of the vaginal cylinder for treatment.   High-dose rate brachytherapy treatment  The vaginal cylinder was affixed to the remote afterloading unit by catheter system. Patient then proceeded to undergo her first high-dose-rate treatment directed at the proximal vagina. Patient was prescribed a dose of 6 grays to be delivered to the mucosal surface. This was achieved using 1 channel with multiple dwell positions. Total treatment time was 313.45 seconds. Patient tolerated the procedure well. After completion of her therapy a radiation survey was performed documenting return of the iridium source into the gamma med safe.  Blair Promise,  M.D.

## 2014-03-20 NOTE — Progress Notes (Signed)
Please see the Nurse Progress Note in the MD Initial Consult Encounter for this patient. 

## 2014-03-20 NOTE — Progress Notes (Signed)
   Department of Radiation Oncology  Phone:  [phone] Fax:        [phone]  Vaginal brachtherapy procedure note  The patient was taken to the nurse's station and placed in the dorsolithotomy position on an examination table. A pelvic exam was performed showing no mucosal lesions within the vaginal vault. The vaginal cuff was well-healed and intact. No pelvic masses were noted on bimanual examination.  The patient proceeded to undergo fitting for her vaginal cylinder. The optimal diameter to distend the vaginal vault without undue discomfort with a 3.0 cm diameter cylinder. The patient tolerated the procedure well.   Simple treatment device note  The patient had construction of her custom vaginal cylinder for her high-dose-rate treatment. She will be treated with three 3.0 diameter rings placed proximally within the vaginal vault. More distally will be three 2.5 cm diameter rings.  Blair Promise, MD

## 2014-03-20 NOTE — Progress Notes (Signed)
  Radiation Oncology         [phone] ________________________________  Name: Terri Ortega MRN: [id number]  Date: 03/20/2014  DOB: 12/02/1940  SIMULATION AND TREATMENT PLANNING NOTE  DIAGNOSIS: Stage IB, grade 1 endometrioid adenocarcinoma  NARRATIVE:  The patient was brought to the [place] suite.  Identity was confirmed.  All relevant records and images related to the planned course of therapy were reviewed.  The patient freely provided informed written consent to proceed with treatment after reviewing the details related to the planned course of therapy. The consent form was witnessed and verified by the simulation staff.  Then, the patient was set-up in a stable reproducible  supine position for radiation therapy.  the patient's custom vaginal cylinder was inserted. This was affixed to the CT/MR stabilization plate to prevent slippage.  CT images were obtained.    The CT images were loaded into the planning software.  Then the target and avoidance structures were contoured.  Treatment planning then occurred.  The radiation prescription was entered and confirmed.   I have requested : This treatment constitutes a Special Treatment Procedure for the following reason: Brachytherapy - High dose Rate  requiring special monitoring for increased toxicities of treatment including daily imaging..  I have ordered:dose calc  PLAN:  The patient will receive 30 Gy in 5 fractions directed at the proximal vagina. The patient's prescription will be to the mucosal surface, 6 gray per fraction. Patient will be treated with iridium 192 as the high-dose-rate source. A 3.0 cm diameter cylinder we used for the patient's treatment. Treatment length of 4 cm will be prescribed.  ________________________________  -----------------------------------  Blair Promise, PhD, MD

## 2014-03-26 ENCOUNTER — Telehealth: Payer: Self-pay | Admitting: *Deleted

## 2014-03-26 NOTE — Telephone Encounter (Signed)
CALLED PATIENT TO REMIND OF HDR TX. FOR 03-27-14, SPOKE WITH PATIENT AND SHE IS AWARE OF THIS APPT.

## 2014-03-27 ENCOUNTER — Ambulatory Visit
Admission: RE | Admit: 2014-03-27 | Discharge: 2014-03-27 | Disposition: A | Discharge status: Home / Self Care | Payer: Medicare Other | Source: Ambulatory Visit | Attending: Radiation Oncology | Admitting: Radiation Oncology

## 2014-03-27 DIAGNOSIS — C541 Malignant neoplasm of endometrium: Secondary | ICD-10-CM

## 2014-03-27 DIAGNOSIS — Z510 Encounter for antineoplastic radiation therapy: Secondary | ICD-10-CM | POA: Diagnosis not present

## 2014-03-27 NOTE — Progress Notes (Signed)
      Department of Radiation Oncology  Phone: [phone]  Fax: [phone]   Vaginal brachtherapy procedure note   The patient was  placed in the dorsolithotomy position on the HDR table. A pelvic exam was performed showing no mucosal lesions within the vaginal vault. The vaginal cuff was well-healed and intact. No pelvic masses were noted on bimanual examination.   The patient proceeded to undergo placement of her vaginal cylinder. The optimal diameter to distend the vaginal vault without undue discomfort with a 3.0 cm diameter cylinder. The patient tolerated the procedure well.   Simple treatment device note   The patient had construction of her custom vaginal cylinder for her high-dose-rate treatment. She will be treated with three 3.0 diameter rings placed proximally within the vaginal vault. More distally will be three 2.5 cm diameter rings.   High-dose-rate brachytherapy procedure note   Verification simulation   A fiducial marker was placed within the vaginal cylinder. An AP and lateral film was obtained in the treatment position. This was compared to planning films earlier  documenting good position of the vaginal cylinder for treatment.   High-dose rate brachytherapy treatment   The vaginal cylinder was affixed to the remote afterloading unit by catheter system. Patient then proceeded to undergo her second high-dose-rate treatment directed at the proximal vagina. Patient was prescribed a dose of 6 grays to be delivered to the mucosal surface. This was achieved using 1 channel with multiple dwell positions. Total treatment time was 334.7 seconds. Patient tolerated the procedure well. After completion of her therapy a radiation survey was performed documenting return of the iridium source into the gamma med safe.   Blair Promise, M.D.

## 2014-03-28 ENCOUNTER — Ambulatory Visit: Payer: Medicare Other | Admitting: Gynecologic Oncology

## 2014-04-03 ENCOUNTER — Encounter: Payer: Medicare Other | Admitting: Radiation Oncology

## 2014-04-03 ENCOUNTER — Ambulatory Visit: Payer: Medicare Other | Admitting: Radiation Oncology

## 2014-04-04 ENCOUNTER — Telehealth: Payer: Self-pay | Admitting: *Deleted

## 2014-04-04 NOTE — Telephone Encounter (Signed)
CALLED PATIENT TO REMIND OF HDR [place] 04-05-14 @ 8 AM, SPOKE WITH PATIENT AND SHE IS AWARE OF THIS Lane.

## 2014-04-05 ENCOUNTER — Ambulatory Visit
Admission: RE | Admit: 2014-04-05 | Discharge: 2014-04-05 | Disposition: A | Discharge status: Home / Self Care | Payer: Medicare Other | Source: Ambulatory Visit | Attending: Radiation Oncology | Admitting: Radiation Oncology

## 2014-04-05 ENCOUNTER — Encounter: Payer: Medicare Other | Admitting: Radiation Oncology

## 2014-04-05 DIAGNOSIS — C541 Malignant neoplasm of endometrium: Secondary | ICD-10-CM

## 2014-04-05 DIAGNOSIS — Z510 Encounter for antineoplastic radiation therapy: Secondary | ICD-10-CM | POA: Diagnosis not present

## 2014-04-05 NOTE — Progress Notes (Signed)
      Department of Radiation Oncology  Phone: [phone]  Fax: [phone]   04/05/2014  Vaginal brachtherapy procedure note   The patient was placed in the dorsolithotomy position on the HDR table. A pelvic exam was performed showing no mucosal lesions within the vaginal vault. The vaginal cuff was well-healed and intact. No pelvic masses were noted on bimanual examination.  The patient proceeded to undergo placement of her vaginal cylinder. The optimal diameter to distend the vaginal vault without undue discomfort with a 3.0 cm diameter cylinder. The patient tolerated the procedure well.   Simple treatment device note   The patient had construction of her custom vaginal cylinder for her high-dose-rate treatment. She will be treated with three 3.0 diameter rings placed proximally within the vaginal vault. More distally will be three 2.5 cm diameter rings.   High-dose-rate brachytherapy procedure note   Verification simulation   A fiducial marker was placed within the vaginal cylinder. An AP and lateral film was obtained in the treatment position. This was compared to planning films earlier documenting good position of the vaginal cylinder for treatment.   High-dose rate brachytherapy treatment   The vaginal cylinder was affixed to the remote afterloading unit by catheter system. Patient then proceeded to undergo her third high-dose-rate treatment directed at the proximal vagina. Patient was prescribed a dose of 6 Gy to be delivered to the mucosal surface. This was achieved using 1 channel with 9 dwell positions. Total treatment time was 364.2 seconds. Patient tolerated the procedure well. After completion of her therapy a radiation survey was performed documenting return of the iridium source into the gamma med safe.   Blair Promise, M.D.

## 2014-04-10 ENCOUNTER — Encounter: Payer: Self-pay | Admitting: Radiation Oncology

## 2014-04-11 ENCOUNTER — Telehealth: Payer: Self-pay | Admitting: *Deleted

## 2014-04-11 NOTE — Telephone Encounter (Signed)
CALLED PATIENT TO REMIND OF HDR TX. FOR 04-12-14 @ 8:30 AM, SPOKE WITH PATIENT AND SHE IS AWARE OF THIS APPT.

## 2014-04-12 ENCOUNTER — Ambulatory Visit
Admission: RE | Admit: 2014-04-12 | Discharge: 2014-04-12 | Disposition: A | Discharge status: Home / Self Care | Payer: Medicare Other | Source: Ambulatory Visit | Attending: Radiation Oncology | Admitting: Radiation Oncology

## 2014-04-12 DIAGNOSIS — C541 Malignant neoplasm of endometrium: Secondary | ICD-10-CM

## 2014-04-12 DIAGNOSIS — Z510 Encounter for antineoplastic radiation therapy: Secondary | ICD-10-CM | POA: Diagnosis not present

## 2014-04-12 NOTE — Progress Notes (Signed)
     Department of Radiation Oncology  Phone: [phone]  Fax: [phone]   04/12/2014   Vaginal brachtherapy procedure note   The patient was placed in the dorsolithotomy position on the HDR table. A pelvic exam was performed showing no mucosal lesions within the vaginal vault. The vaginal cuff was well-healed and intact. No pelvic masses were noted on bimanual examination.  The patient proceeded to undergo placement of her vaginal cylinder. The optimal diameter to distend the vaginal vault without undue discomfort with a 3.0 cm diameter cylinder. The patient tolerated the procedure well.    Simple treatment device note  The patient had construction of her custom vaginal cylinder for her high-dose-rate treatment. She will be treated with three 3.0 diameter rings placed proximally within the vaginal vault. More distally will be three 2.5 cm diameter rings.   High-dose-rate brachytherapy procedure note   Verification simulation   A fiducial marker was placed within the vaginal cylinder. An AP and lateral film was obtained in the treatment position. This was compared to planning films earlier documenting good position of the vaginal cylinder for treatment.   High-dose rate brachytherapy treatment   The vaginal cylinder was affixed to the remote afterloading unit by catheter system. Patient then proceeded to undergo her fourth high-dose-rate treatment directed at the proximal vagina. Patient was prescribed a dose of 6 Gy to be delivered to the mucosal surface. This was achieved using 1 channel with 9 dwell positions. Total treatment time was 388.8 seconds. Patient tolerated the procedure well. After completion of her therapy a radiation survey was performed documenting return of the iridium source into the gamma med safe.

## 2014-04-16 ENCOUNTER — Telehealth: Payer: Self-pay | Admitting: *Deleted

## 2014-04-16 NOTE — Telephone Encounter (Signed)
Called patient to remind of HDR Tx. For 915-15@ 8:30 am, spoke with patient and she is aware of this appt.

## 2014-04-17 ENCOUNTER — Ambulatory Visit
Admission: RE | Admit: 2014-04-17 | Discharge: 2014-04-17 | Disposition: A | Discharge status: Home / Self Care | Payer: Medicare Other | Source: Ambulatory Visit | Attending: Radiation Oncology | Admitting: Radiation Oncology

## 2014-04-17 DIAGNOSIS — C541 Malignant neoplasm of endometrium: Secondary | ICD-10-CM

## 2014-04-17 DIAGNOSIS — Z510 Encounter for antineoplastic radiation therapy: Secondary | ICD-10-CM | POA: Diagnosis not present

## 2014-04-17 NOTE — Progress Notes (Signed)
     Department of Radiation Oncology  Phone: [phone]  Fax: [phone]   04/17/2014   Endometrial cancer - Primary ICD-9-CM: 182.0 ICD-10-CM: C54.1   Vaginal brachtherapy procedure note  The patient was placed in the dorsolithotomy position on the HDR table. A pelvic exam was performed showing no mucosal lesions within the vaginal vault. The vaginal cuff was well-healed and intact. No pelvic masses were noted on bimanual examination.  The patient proceeded to undergo placement of her vaginal cylinder. The optimal diameter to distend the vaginal vault without undue discomfort with a 3.0 cm diameter cylinder. The patient tolerated the procedure well.   Simple treatment device note    The patient had construction of her custom vaginal cylinder for her high-dose-rate treatment. She will be treated with three 3.0 diameter rings placed proximally within the vaginal vault. More distally will be three 2.5 cm diameter rings.   High-dose-rate brachytherapy procedure note   Verification simulation   A fiducial marker was placed within the vaginal cylinder. An AP and lateral film was obtained in the treatment position. This was compared to planning films earlier documenting good position of the vaginal cylinder for treatment.   High-dose rate brachytherapy treatment   The vaginal cylinder was affixed to the remote afterloading unit by catheter system. Patient then proceeded to undergo her fifth high-dose-rate treatment directed at the proximal vagina. Patient was prescribed a dose of 6 Gy to be delivered to the mucosal surface. This was achieved using 1 channel with 9 dwell positions. Total treatment time was 407.8 seconds. Patient tolerated the procedure well. After completion of her therapy a radiation survey was performed documenting return of the iridium source into the gamma med safe.

## 2014-05-07 ENCOUNTER — Other Ambulatory Visit: Payer: Self-pay | Admitting: Neurological Surgery

## 2014-05-07 DIAGNOSIS — M4120 Other idiopathic scoliosis, site unspecified: Secondary | ICD-10-CM

## 2014-05-10 ENCOUNTER — Encounter: Payer: Self-pay | Admitting: Radiation Oncology

## 2014-05-10 NOTE — Progress Notes (Signed)
  Radiation Oncology         ([phone]) [phone] ________________________________  Name: Terri Ortega MRN: [id number]  Date: 05/10/2014  DOB: September 27, 1940  End of Treatment Note   Diagnosis:    Stage IB, grade 1 endometrioid adenocarcinoma     Indication for treatment:  Postop to reduce chances for vaginal cuff recurrence       Radiation treatment dates:   August 18, August 25, September 3, September 10, September 15  Site/dose:   Proximal vagina, 30 gray in 5 fractions  Beams/energy:   The patient was treated with intracavitary brachytherapy treatments using iridium 192 as the high-dose-rate source, a 3 cm diameter cylinder was used to deliver the patient's treatment, a 4 cm treatment length was prescribed. 6 Pearline Cables was prescribed to the vaginal mucosal surface.  Narrative: The patient tolerated radiation treatment relatively well.   She had minimal vaginal discomfort with the treatment.  Plan: The patient has completed radiation treatment. The patient will return to radiation oncology clinic for routine followup in one month. I advised them to call or return sooner if they have any questions or concerns related to their recovery or treatment.  -----------------------------------  Blair Promise, PhD, MD

## 2014-05-16 ENCOUNTER — Ambulatory Visit
Admission: RE | Admit: 2014-05-16 | Discharge: 2014-05-16 | Disposition: A | Discharge status: Home / Self Care | Payer: Medicare Other | Source: Ambulatory Visit | Attending: Neurological Surgery | Admitting: Neurological Surgery

## 2014-05-16 VITALS — BP 133/69 | HR 64

## 2014-05-16 DIAGNOSIS — M4120 Other idiopathic scoliosis, site unspecified: Secondary | ICD-10-CM

## 2014-05-16 DIAGNOSIS — M5441 Lumbago with sciatica, right side: Secondary | ICD-10-CM

## 2014-05-16 DIAGNOSIS — M5137 Other intervertebral disc degeneration, lumbosacral region: Secondary | ICD-10-CM

## 2014-05-16 MED ORDER — DIAZEPAM 5 MG PO TABS
5.0000 mg | ORAL_TABLET | Freq: Once | ORAL | Status: AC
  Administered 2014-05-16: 5 mg via ORAL

## 2014-05-16 MED ORDER — IOHEXOL 180 MG/ML  SOLN
17.0000 mL | Freq: Once | INTRAMUSCULAR | Status: AC | PRN
  Administered 2014-05-16: 17 mL via INTRATHECAL

## 2014-05-16 NOTE — Discharge Instructions (Signed)

## 2014-05-28 ENCOUNTER — Telehealth: Payer: Self-pay | Admitting: *Deleted

## 2014-05-28 ENCOUNTER — Ambulatory Visit
Admission: RE | Admit: 2014-05-28 | Discharge: 2014-05-28 | Disposition: A | Discharge status: Home / Self Care | Payer: Medicare Other | Source: Ambulatory Visit | Attending: Radiation Oncology | Admitting: Radiation Oncology

## 2014-05-28 ENCOUNTER — Encounter: Payer: Self-pay | Admitting: Oncology

## 2014-05-28 VITALS — BP 153/62 | HR 76 | Temp 97.7°F | Resp 20 | Ht 64.0 in | Wt 170.6 lb

## 2014-05-28 DIAGNOSIS — C541 Malignant neoplasm of endometrium: Secondary | ICD-10-CM

## 2014-05-28 HISTORY — DX: Reserved for inherently not codable concepts without codable children: IMO0001

## 2014-05-28 HISTORY — DX: Reserved for concepts with insufficient information to code with codable children: IMO0002

## 2014-05-28 NOTE — Progress Notes (Signed)
  Radiation Oncology         (336) [phone] ________________________________  Name: Terri Ortega MRN: [id number]  Date: 05/28/2014  DOB: 1941-03-01  Follow-Up Visit Note  CC: Kandice Hams, MD  Sherol Dade., MD    ICD-9-CM ICD-10-CM  1. Endometrial cancer 182.0 C54.1    Diagnosis:   Stage IB, grade 1 endometrioid adenocarcinoma   Interval Since Last Radiation:  6  months  Narrative:  The patient returns today for routine follow-up.  She is doing well without complaints. She denies any discomfort or vaginal bleeding after her radiation treatment. The patient denies any urinary symptoms or bowel complaints.                              ALLERGIES:  has No Known Allergies.  Meds: Current Outpatient Prescriptions  Medication Sig Dispense Refill  . aspirin EC 81 MG tablet Take 81 mg by mouth daily.       . Cholecalciferol (VITAMIN D3) 2000 UNITS capsule Take by mouth daily.       . furosemide (LASIX) 40 MG tablet Take 20 mg by mouth daily.       Marland Kitchen levothyroxine (SYNTHROID) 112 MCG tablet Take by mouth daily before breakfast.       . naproxen sodium (ANAPROX) 220 MG tablet Take 220 mg by mouth 2 (two) times daily with a meal.      . Omega-3 1000 MG CAPS Take by mouth.      . potassium chloride (K-DUR) 10 MEQ tablet Take 10 mEq by mouth every other day.       . raloxifene (EVISTA) 60 MG tablet daily.        No current facility-administered medications for this encounter.    Physical Findings: The patient is in no acute distress. Patient is alert and oriented.  height is 5\' 4"  (1.626 m) and weight is 170 lb 9.6 oz (77.384 kg). Her oral temperature is 97.7 F (36.5 C). Her blood pressure is 153/62 and her pulse is 76. Her respiration is 20. .  The lungs are clear. The heart has a regular rhythm and rate. The abdomen is soft and nontender with normal bowel sounds. Pap smear is not performed in light of recent completion of treatment.  Lab Findings: Lab Results  Component  Value Date   WBC 11.2* 04/15/2009   HGB 9.3* 04/15/2009   HCT 27.2* 04/15/2009   MCV 94.2 04/15/2009   PLT 159 04/15/2009      Impression:  The patient is doing well at this time without any side effects after her radiation treatment.  Plan:  Routine followup in April 2016.   Patient will be seen by gynecologic oncology in late December or early January for her first Pap smear and pelvic exam after completion of treatment.  Today the patient was given a vaginal dilator and instructions on its use.  ____________________________________ Blair Promise, MD

## 2014-05-28 NOTE — Progress Notes (Signed)
Terri Ortega here for follow up.  She reports occasional pains in her pelvic area.  She reports urinary frequency in the mornings after taking lasix.  She denies dysuria, constipation, diarrhea and rectal/vaginal bleeding.  She reports fatigue that is improving.  She reports trouble walking/pain in her right knee after sitting for long periods.  She says this is not a new issue.

## 2014-05-28 NOTE — Progress Notes (Signed)
Terri Ortega was given a size medium vaginal dilator and Surgilube.  She was instructed to apply the Surgilube to the dilator and to use it for 10 minutes three times a week (Monday, Wednesday, Friday) and to wash it with soap and water afterwards.  [name] verbalized understanding.  Also called Gyn Oncology regarding an appointment for January.  [name] said she would call [name] for an appointment.

## 2014-05-28 NOTE — Telephone Encounter (Signed)
Santiago Glad, RN in rad onc called stating pt needs to be seen by Dr. Denman George in January and was wondering if we could call pt with appt. Scheduled pt to see Dr. Denman George 08/28/14 at 1:15pm. Pt agreeable to come then for appt.

## 2014-07-19 ENCOUNTER — Telehealth: Payer: Self-pay | Admitting: *Deleted

## 2014-07-19 NOTE — Telephone Encounter (Signed)
Pt scheduled on Tuesday 08/28/14 in error. Pt rescheduled to 08/31/14 at 1:15pm. Pt agreeable to this appt time. Gave her our phone number to call us back if for any reason she cannot make this appt.

## 2014-08-28 ENCOUNTER — Ambulatory Visit: Payer: Medicare Other | Admitting: Gynecologic Oncology

## 2014-08-31 ENCOUNTER — Ambulatory Visit: Payer: Medicare Other | Attending: Gynecologic Oncology | Admitting: Gynecologic Oncology

## 2014-08-31 ENCOUNTER — Encounter: Payer: Self-pay | Admitting: Gynecologic Oncology

## 2014-08-31 VITALS — BP 146/79 | HR 69 | Temp 98.1°F | Resp 18 | Ht 64.0 in | Wt 169.6 lb

## 2014-08-31 DIAGNOSIS — M810 Age-related osteoporosis without current pathological fracture: Secondary | ICD-10-CM | POA: Insufficient documentation

## 2014-08-31 DIAGNOSIS — Z7982 Long term (current) use of aspirin: Secondary | ICD-10-CM | POA: Diagnosis not present

## 2014-08-31 DIAGNOSIS — I10 Essential (primary) hypertension: Secondary | ICD-10-CM | POA: Diagnosis not present

## 2014-08-31 DIAGNOSIS — C541 Malignant neoplasm of endometrium: Secondary | ICD-10-CM | POA: Diagnosis present

## 2014-08-31 DIAGNOSIS — Z923 Personal history of irradiation: Secondary | ICD-10-CM | POA: Diagnosis not present

## 2014-08-31 DIAGNOSIS — Z8542 Personal history of malignant neoplasm of other parts of uterus: Secondary | ICD-10-CM

## 2014-08-31 DIAGNOSIS — Z90710 Acquired absence of both cervix and uterus: Secondary | ICD-10-CM | POA: Insufficient documentation

## 2014-08-31 DIAGNOSIS — Z79899 Other long term (current) drug therapy: Secondary | ICD-10-CM | POA: Diagnosis not present

## 2014-08-31 DIAGNOSIS — E039 Hypothyroidism, unspecified: Secondary | ICD-10-CM | POA: Diagnosis not present

## 2014-08-31 DIAGNOSIS — M419 Scoliosis, unspecified: Secondary | ICD-10-CM | POA: Insufficient documentation

## 2014-08-31 DIAGNOSIS — E0500 Thyrotoxicosis with diffuse goiter without thyrotoxic crisis or storm: Secondary | ICD-10-CM | POA: Insufficient documentation

## 2014-08-31 NOTE — Progress Notes (Signed)
HPI:  Terri Ortega is a 74 y.o. year old woman initially seen in consultation in June, 2015 for grade 1 endometrial cancer.  She then underwent a robotic assisted total laparoscopic hysterectomy, bilateral salpingo-oophorectomy, pelvic and para-aortic lymphadenectomy (with SLN mapping) on 4/74/25 without complications.  Her postoperative course was uncomplicated.  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 She was dispositioned to receive adjuvant vaginal break E therapy which she completed in November of 2015.  Interval Hx:  She is seen today for a followup visit. She denies symptoms concerning for recurrence (including vaginal bleeding, pelvic or abdominal pains, new lower extremity edema, cough, change in bladder or bowel habit, or weight loss). She is sexually active and denies difficulty or discomfort with this. She is continuing to use her vaginal dilator.   Review of systems: Constitutional:  She has no weight gain or weight loss. She has no fever or chills. Eyes: No blurred vision Ears, Nose, Mouth, Throat: No dizziness, headaches or changes in hearing. No mouth sores. Cardiovascular: No chest pain, palpitations or edema. Respiratory:  No shortness of breath, wheezing or cough Gastrointestinal: She has normal bowel movements without diarrhea or constipation. She denies any nausea or vomiting. She denies blood in her stool or heart burn. Genitourinary:  She denies pelvic pain, pelvic pressure or changes in her urinary function. She has no hematuria, dysuria, or incontinence. She has no irregular vaginal bleeding or vaginal discharge Musculoskeletal: Denies muscle weakness or joint pains.  Skin:  She has no skin changes, rashes or itching Neurological:  Denies dizziness or headaches. No neuropathy, no numbness or tingling. Psychiatric:  She  denies depression or anxiety. Hematologic/Lymphatic:   No easy bruising or bleeding  No Known Allergies   Current Outpatient Prescriptions on File Prior to Visit  Medication Sig Dispense Refill  . aspirin EC 81 MG tablet Take 81 mg by mouth daily.     . Cholecalciferol (VITAMIN D3) 2000 UNITS capsule Take by mouth daily.     Marland Kitchen levothyroxine (SYNTHROID) 112 MCG tablet Take by mouth daily before breakfast.     . naproxen sodium (ANAPROX) 220 MG tablet Take 220 mg by mouth 2 (two) times daily with a meal.    . Omega-3 1000 MG CAPS Take by mouth.    . potassium chloride (K-DUR) 10 MEQ tablet Take 10 mEq by mouth every other day.     . raloxifene (EVISTA) 60 MG tablet daily.      No current facility-administered medications on file prior to visit.     Past Medical History  Diagnosis Date  . HTN (hypertension)   . Hypothyroid   . Graves disease   . Osteoporosis   . Uterine cancer   . Scoliosis   . Radiation 03/20/14, 03/27/14, 04/05/14, 04/12/14, 04/17/14    bracytherapy to proximal vagina 30 gray   Past Surgical History  Procedure Laterality Date  . Total knee arthroplasty      right  . Bunionectomy      left  . Robotic assisted total hysterectomy with bilateral salpingo oopherectomy  01/30/14    with lymph node biopsy   History   Social History  . Marital Status: Married    Spouse Name: N/A    Number of Children: 3  . Years of Education: N/A   Occupational History  . administrative assitant    Social History Main  Topics  . Smoking status: Never Smoker   . Smokeless tobacco: Not on file  . Alcohol Use: No  . Drug Use: No  . Sexual Activity: Not Currently   Other Topics Concern  . Not on file   Social History Narrative   History reviewed. No pertinent family history.  Oncology History   Stage IB grade 1 endometrioid endometrial cancer treated with robotic staging on 01/30/14. Patient met high/intermed risk factors based on outer half (65%) myometrial invasion in a  woman older than 70. Dispositioned to receive adjuvant vaginal brachytherapy to reduce the risk for recurrence at the vaginal cuff.     Endometrial cancer   12/29/2013 Initial Diagnosis Endometrial cancer. Grade 1. Hysteroscopy D&C   01/30/2014 Surgery robotic hyst, bso, pelvic and PA node dissection at [facility] with Dr Nancy Marus     Physical Exam: Blood pressure 146/79, pulse 69, temperature 98.1 F (36.7 C), temperature source Oral, resp. rate 18, height '5\' 4"'  (1.626 m), weight 169 lb 9.6 oz (76.93 kg). General: Well dressed, well nourished in no apparent distress.   HEENT:  Normocephalic and atraumatic, no lesions.  Extraocular muscles intact. Sclerae anicteric. Pupils equal, round, reactive. No mouth sores or ulcers. Thyroid is normal size, not nodular, midline. Skin:  No lesions or rashes. Breasts:  Soft, symmetric.  No skin or nipple changes.  No palpable LN or masses. Lungs:  Clear to auscultation bilaterally.  No wheezes. Cardiovascular:  Regular rate and rhythm.  No murmurs or rubs. Abdomen:  Soft, nontender, nondistended.  No palpable masses.  No hepatosplenomegaly.  No ascites. Normal bowel sounds.  No hernias.  Incisions are completely healed. Genitourinary: Normal EGBUS  Vaginal cuff intact.  No bleeding or discharge.  No cul de sac fullness. Mild radiation changes to mucosa. Extremities: No cyanosis, clubbing or edema.  No calf tenderness or erythema. No palpable cords. Psychiatric: Mood and affect are appropriate. Neurological: Awake, alert and oriented x 3. Sensation is intact, no neuropathy.  Musculoskeletal: No pain, normal strength and range of motion.  Assessment:    74 y.o. 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   S/p robotic hysterectomy, BSO, lymphadenectomy on 01/30/14 in [place]. No LVSI, 65% myometrial invasion, negative pelvic washings and negative lymph nodes. S/p adjuvant vaginal brachytherapy in November 2015. No evidence of  recurrence on surveillance examination.   Plan: 1) Disease counseling - We reviewed symptoms concerning for recurrence (including vaginal bleeding, pelvic or abdominal pains, new lower extremity edema, cough, change in bladder or bowel habit, or weight loss) and the patient was informed to contact us if she notes these symptoms in between her scheduled surveillance visits.  3)  Return to clinic in 3 months to see Dr Dwana Curd and in 6 months to see me. Donaciano Eva, MD

## 2014-08-31 NOTE — Patient Instructions (Addendum)
Followup with Dr. Sondra Come in 3 months.  Followup with Dr. Denman George in 6 months (July 2016). Please call us in May to schedule this appointment.

## 2014-09-06 ENCOUNTER — Encounter: Payer: Self-pay | Admitting: Gynecologic Oncology

## 2014-10-17 ENCOUNTER — Other Ambulatory Visit: Payer: Self-pay | Admitting: Gastroenterology

## 2014-10-30 ENCOUNTER — Encounter (HOSPITAL_COMMUNITY): Payer: Self-pay | Admitting: Emergency Medicine

## 2014-10-30 ENCOUNTER — Observation Stay (HOSPITAL_COMMUNITY)
Admission: EM | Admit: 2014-10-30 | Discharge: 2014-10-31 | Disposition: A | Discharge status: Home / Self Care | Payer: Medicare Other | Attending: Internal Medicine | Admitting: Internal Medicine

## 2014-10-30 DIAGNOSIS — Z79899 Other long term (current) drug therapy: Secondary | ICD-10-CM | POA: Insufficient documentation

## 2014-10-30 DIAGNOSIS — I10 Essential (primary) hypertension: Secondary | ICD-10-CM | POA: Diagnosis not present

## 2014-10-30 DIAGNOSIS — I5032 Chronic diastolic (congestive) heart failure: Secondary | ICD-10-CM | POA: Diagnosis not present

## 2014-10-30 DIAGNOSIS — C541 Malignant neoplasm of endometrium: Secondary | ICD-10-CM | POA: Diagnosis not present

## 2014-10-30 DIAGNOSIS — Z923 Personal history of irradiation: Secondary | ICD-10-CM | POA: Diagnosis not present

## 2014-10-30 DIAGNOSIS — M51379 Other intervertebral disc degeneration, lumbosacral region without mention of lumbar back pain or lower extremity pain: Secondary | ICD-10-CM | POA: Diagnosis present

## 2014-10-30 DIAGNOSIS — M5137 Other intervertebral disc degeneration, lumbosacral region: Secondary | ICD-10-CM | POA: Diagnosis not present

## 2014-10-30 DIAGNOSIS — R079 Chest pain, unspecified: Principal | ICD-10-CM | POA: Diagnosis present

## 2014-10-30 DIAGNOSIS — E039 Hypothyroidism, unspecified: Secondary | ICD-10-CM | POA: Diagnosis not present

## 2014-10-30 DIAGNOSIS — D72829 Elevated white blood cell count, unspecified: Secondary | ICD-10-CM | POA: Diagnosis present

## 2014-10-30 DIAGNOSIS — M419 Scoliosis, unspecified: Secondary | ICD-10-CM | POA: Insufficient documentation

## 2014-10-30 DIAGNOSIS — M810 Age-related osteoporosis without current pathological fracture: Secondary | ICD-10-CM | POA: Insufficient documentation

## 2014-10-30 DIAGNOSIS — R059 Cough, unspecified: Secondary | ICD-10-CM

## 2014-10-30 DIAGNOSIS — R05 Cough: Secondary | ICD-10-CM

## 2014-10-30 DIAGNOSIS — Z8542 Personal history of malignant neoplasm of other parts of uterus: Secondary | ICD-10-CM | POA: Diagnosis not present

## 2014-10-30 DIAGNOSIS — K219 Gastro-esophageal reflux disease without esophagitis: Secondary | ICD-10-CM | POA: Diagnosis not present

## 2014-10-30 DIAGNOSIS — R609 Edema, unspecified: Secondary | ICD-10-CM

## 2014-10-30 DIAGNOSIS — R0600 Dyspnea, unspecified: Secondary | ICD-10-CM

## 2014-10-30 DIAGNOSIS — Z7982 Long term (current) use of aspirin: Secondary | ICD-10-CM | POA: Diagnosis not present

## 2014-10-30 DIAGNOSIS — I5030 Unspecified diastolic (congestive) heart failure: Secondary | ICD-10-CM

## 2014-10-30 HISTORY — DX: Heart failure, unspecified: I50.9

## 2014-10-30 LAB — CBC
HEMATOCRIT: 38.1 % (ref 36.0–46.0)
HEMOGLOBIN: 13 g/dL (ref 12.0–15.0)
MCH: 31.3 pg (ref 26.0–34.0)
MCHC: 34.1 g/dL (ref 30.0–36.0)
MCV: 91.6 fL (ref 78.0–100.0)
PLATELETS: 175 10*3/uL (ref 150–400)
RBC: 4.16 MIL/uL (ref 3.87–5.11)
RDW: 12.7 % (ref 11.5–15.5)
WBC: 13.3 10*3/uL — AB (ref 4.0–10.5)

## 2014-10-30 LAB — BASIC METABOLIC PANEL
Anion gap: 11 (ref 5–15)
BUN: 23 mg/dL (ref 6–23)
CALCIUM: 9.2 mg/dL (ref 8.4–10.5)
CO2: 24 mmol/L (ref 19–32)
CREATININE: 0.85 mg/dL (ref 0.50–1.10)
Chloride: 105 mmol/L (ref 96–112)
GFR calc Af Amer: 76 mL/min — ABNORMAL LOW (ref >90)
GFR calc non Af Amer: 66 mL/min — ABNORMAL LOW (ref >90)
Glucose, Bld: 119 mg/dL — ABNORMAL HIGH (ref 70–99)
POTASSIUM: 3.8 mmol/L (ref 3.5–5.1)
SODIUM: 140 mmol/L (ref 135–145)

## 2014-10-30 LAB — BRAIN NATRIURETIC PEPTIDE: B NATRIURETIC PEPTIDE 5: 308.5 pg/mL — AB (ref 0.0–100.0)

## 2014-10-30 LAB — I-STAT TROPONIN, ED: Troponin i, poc: 0 ng/mL (ref 0.00–0.08)

## 2014-10-30 NOTE — ED Provider Notes (Signed)
CSN: [id number]     Arrival date & time 10/30/14  1948 History   First MD Initiated Contact with Patient 10/30/14 2014     Chief Complaint  Patient presents with  . Chest Pain  . Irregular Heart Beat     (Consider location/radiation/quality/duration/timing/severity/associated sxs/prior Treatment) HPI [place] of anterior chest pain immediately inferior to left breast onset 11 AM today. Symptoms accompanied by shortness of breath nausea sweatiness and 2 episodes of vomiting. She is asymptomatic since treatment with aspirin 324 mg, Zofran intravenously and normal saline intravenous bolus administered by EMS. Nothing made symptoms better or worse. She's not had pain similar to this in the past. Patient had prehospital 12-lead ECGs consistent with sinus bradycardia in the 40s Past Medical History  Diagnosis Date  . HTN (hypertension)   . Hypothyroid   . Graves disease   . Osteoporosis   . Uterine cancer   . Scoliosis   . Radiation 03/20/14, 03/27/14, 04/05/14, 04/12/14, 04/17/14    bracytherapy to proximal vagina 30 gray   Past Surgical History  Procedure Laterality Date  . Total knee arthroplasty      right  . Bunionectomy      left  . Robotic assisted total hysterectomy with bilateral salpingo oopherectomy  01/30/14    with lymph node biopsy   History reviewed. No pertinent family history. History  Substance Use Topics  . Smoking status: Never Smoker   . Smokeless tobacco: Not on file  . Alcohol Use: No   OB History    No data available     Review of Systems  Respiratory: Positive for shortness of breath.   Cardiovascular: Positive for chest pain.  Gastrointestinal: Positive for nausea and vomiting.  All other systems reviewed and are negative.     Allergies  Review of patient's allergies indicates no known allergies.  Home Medications   Prior to Admission medications   Medication Sig Start Date End Date Taking? Authorizing Provider  aspirin EC 81 MG tablet Take 81  mg by mouth daily.    Yes Historical Provider, MD  Cholecalciferol (VITAMIN D3) 2000 UNITS capsule Take by mouth daily.    Yes Historical Provider, MD  furosemide (LASIX) 20 MG tablet Take 20 mg by mouth daily.  08/13/14  Yes Historical Provider, MD  levothyroxine (SYNTHROID) 112 MCG tablet Take by mouth daily before breakfast.  10/02/13  Yes Historical Provider, MD  naproxen sodium (ANAPROX) 220 MG tablet Take 220 mg by mouth 2 (two) times daily with a meal.   Yes Historical Provider, MD  Omega-3 1000 MG CAPS Take by mouth.   Yes Historical Provider, MD  potassium chloride (K-DUR) 10 MEQ tablet Take 10 mEq by mouth every other day.  12/08/13  Yes Historical Provider, MD  raloxifene (EVISTA) 60 MG tablet daily.  10/18/13  Yes Historical Provider, MD   BP 116/45 mmHg  Pulse 83  Temp(Src) 97.8 F (36.6 C) (Oral)  Resp 15  Ht 5\' 4"  (1.626 m)  Wt 170 lb (77.111 kg)  BMI 29.17 kg/m2  SpO2 98% Physical Exam  Constitutional: She appears well-developed and well-nourished.  HENT:  Head: Normocephalic and atraumatic.  Eyes: Conjunctivae are normal. Pupils are equal, round, and reactive to light.  Neck: Neck supple. No tracheal deviation present. No thyromegaly present.  Cardiovascular: Normal rate and regular rhythm.   No murmur heard. Pulmonary/Chest: Effort normal and breath sounds normal.  Abdominal: Soft. Bowel sounds are normal. She exhibits no distension. There is no tenderness.  Musculoskeletal:  Normal range of motion. She exhibits no edema or tenderness.  Neurological: She is alert. Coordination normal.  Skin: Skin is warm and dry. No rash noted.  Psychiatric: She has a normal mood and affect.  Nursing note and vitals reviewed.   ED Course  Procedures (including critical care time) Labs Review Labs Reviewed  [place], ED    Imaging Review No results found.   EKG Interpretation None     ED ECG REPORT   Date:  10/30/2014  Rate: 65  Rhythm: normal sinus rhythm  QRS Axis: left  Intervals: normal  ST/T Wave abnormalities: nonspecific T wave changes  Conduction Disutrbances:nonspecific intraventricular conduction delay  Narrative Interpretation:   Old EKG Reviewed: unchanged  I have personally reviewed the EKG tracing and agree with the computerized printout as noted.  11:30 PM patient remains asymptomatic. Results for orders placed or performed during the hospital encounter of 10/30/14  CBC  Result Value Ref Range   WBC 13.3 (H) 4.0 - 10.5 K/uL   RBC 4.16 3.87 - 5.11 MIL/uL   Hemoglobin 13.0 12.0 - 15.0 g/dL   HCT 38.1 36.0 - 46.0 %   MCV 91.6 78.0 - 100.0 fL   MCH 31.3 26.0 - 34.0 pg   MCHC 34.1 30.0 - 36.0 g/dL   RDW 12.7 11.5 - 15.5 %   Platelets 175 150 - 400 K/uL  BNP (order ONLY if patient complains of dyspnea/SOB AND you have documented it for THIS visit)  Result Value Ref Range   B Natriuretic Peptide 308.5 (H) 0.0 - 100.0 pg/mL  Basic metabolic panel  Result Value Ref Range   Sodium 140 135 - 145 mmol/L   Potassium 3.8 3.5 - 5.1 mmol/L   Chloride 105 96 - 112 mmol/L   CO2 24 19 - 32 mmol/L   Glucose, Bld 119 (H) 70 - 99 mg/dL   BUN 23 6 - 23 mg/dL   Creatinine, Ser 0.85 0.50 - 1.10 mg/dL   Calcium 9.2 8.4 - 10.5 mg/dL   GFR calc non Af Amer 66 (L) >90 mL/min   GFR calc Af Amer 76 (L) >90 mL/min   Anion gap 11 5 - 15  I-stat troponin, ED (not at [facility])  Result Value Ref Range   Troponin i, poc 0.00 0.00 - 0.08 ng/mL   Comment 3           No results found.  MDM  Heart score equals 5 based on history, EKG criteria, age, cardiac risk factors Spoke with Dr.NIU plan 23 hour observation telemetry Final diagnoses:  None   Diagnosis chest pain     Orlie Dakin, MD 10/30/14 [id number]

## 2014-10-30 NOTE — ED Notes (Signed)
MD at bedside. 

## 2014-10-30 NOTE — ED Notes (Signed)
Pt arrives via [facility], reports chest discomfort for most of the day that radiates into her back. Upon EMS arrival, pts heart rate was very irregular in the 60's and 70's then dropped to the mid 40's when placed on their stretcher. Pt reports some nausea and sob with 2 episodes of vomiting. Pt given 324 mg asa, 4mg  zofran and 341ml normal saline. Alert, oriented x4.

## 2014-10-30 NOTE — H&P (Signed)
[facility] History and Physical  TREASE BREMNER ZTI:[id number] DOB: Mar 25, 1941 DOA: 10/30/2014  Referring physician: ED physician PCP: Kandice Hams, MD  Specialists:   Chief Complaint: Chest pain   HPI: Terri Ortega is a 74 y.o. female With past medical history of hypertension, hypothyroidism, endometrial cance 2015  (post status of radiation and surgery), diastolic congestive heart failure (EF of 60-65% with grade 2 diastolic dysfunction), who presents with the chest pain.  Patient reports that her chest pain started at about 11 AM. It is located in the substernal area, pressure-like, mild, persistent, nonradiating. It is associated with shortness of breath, diaphoresis and nausea. She vomited twice without blood in vomitus. She reports having chronic mild dry cough, which has not changed. Patient is not taking ACEI. Never smoked. She has bilateral leg edema. Patient denies fever, chills, fatigue, headaches, abdominal pain, diarrhea, constipation, dysuria, urgency, frequency, hematuria, skin rashes. No unilateral weakness, numbness or tingling sensations. No vision change or hearing loss.  In ED, patient was found to have leukocytosis with every BC 13.3, temperature normal, no tachycardia, electrolytes okay, BNP is 308, negative troponin. EKG showed old left axis deviation, occasional PVC and nonspecific T-wave flattening. Patient is admitted to inpatient for further evaluation and treatment.  Review of Systems: As presented in the history of presenting illness, rest negative.  Where does patient live?  At home Can patient participate in ADLs? little  Allergy: No Known Allergies  Past Medical History  Diagnosis Date  . HTN (hypertension)   . Hypothyroid   . Graves disease   . Osteoporosis   . Uterine cancer   . Scoliosis   . Radiation 03/20/14, 03/27/14, 04/05/14, 04/12/14, 04/17/14    bracytherapy to proximal vagina 30 gray  . CHF (congestive heart failure)     Past  Surgical History  Procedure Laterality Date  . Total knee arthroplasty      right  . Bunionectomy      left  . Robotic assisted total hysterectomy with bilateral salpingo oopherectomy  01/30/14    with lymph node biopsy    Social History:  reports that she has never smoked. She does not have any smokeless tobacco history on file. She reports that she does not drink alcohol or use illicit drugs.  Family History:  Family History  Problem Relation Age of Onset  . Heart disease Mother      Prior to Admission medications   Medication Sig Start Date End Date Taking? Authorizing Provider  aspirin EC 81 MG tablet Take 81 mg by mouth daily.    Yes Historical Provider, MD  Cholecalciferol (VITAMIN D3) 2000 UNITS capsule Take by mouth daily.    Yes Historical Provider, MD  furosemide (LASIX) 20 MG tablet Take 20 mg by mouth daily.  08/13/14  Yes Historical Provider, MD  levothyroxine (SYNTHROID) 112 MCG tablet Take by mouth daily before breakfast.  10/02/13  Yes Historical Provider, MD  naproxen sodium (ANAPROX) 220 MG tablet Take 220 mg by mouth 2 (two) times daily with a meal.   Yes Historical Provider, MD  Omega-3 1000 MG CAPS Take by mouth.   Yes Historical Provider, MD  potassium chloride (K-DUR) 10 MEQ tablet Take 10 mEq by mouth every other day.  12/08/13  Yes Historical Provider, MD  raloxifene (EVISTA) 60 MG tablet daily.  10/18/13  Yes Historical Provider, MD    Physical Exam: Filed Vitals:   10/30/14 2300 10/30/14 2315 10/30/14 2330 10/31/14 0106  BP: 151/67 113/91 129/62 136/60  Pulse: 81 77 80 82  Temp:    98.4 F (36.9 C)  TempSrc:    Oral  Resp: 18 11 16 16   Height:      Weight:      SpO2: 97% 95% 94% 96%   General: Not in acute distress HEENT:       Eyes: PERRL, EOMI, no scleral icterus       ENT: No discharge from the ears and nose, no pharynx injection, no tonsillar enlargement.        Neck: No JVD, no bruit, no mass felt. Cardiac: S1/S2, RRR, No murmurs, No gallops  or rubs Pulm: Good air movement bilaterally. Clear to auscultation bilaterally. No rales, wheezing, rhonchi or rubs. Abd: Soft, nondistended, nontender, no rebound pain, no organomegaly, BS present Ext: 1+pitting leg edema bilaterally. 2+DP/PT pulse bilaterally Musculoskeletal: No joint deformities, erythema, or stiffness, ROM full Skin: No rashes.  Neuro: Alert and oriented X3, cranial nerves II-XII grossly intact, muscle strength 5/5 in all extremeties, sensation to light touch intact. B Psych: Patient is not psychotic, no suicidal or hemocidal ideation.  Labs on Admission:  Basic Metabolic Panel:  Recent Labs Lab 10/30/14 2027  NA 140  K 3.8  CL 105  CO2 24  GLUCOSE 119*  BUN 23  CREATININE 0.85  CALCIUM 9.2   Liver Function Tests: No results for input(s): AST, ALT, ALKPHOS, BILITOT, PROT, ALBUMIN in the last 168 hours. No results for input(s): LIPASE, AMYLASE in the last 168 hours. No results for input(s): AMMONIA in the last 168 hours. CBC:  Recent Labs Lab 10/30/14 2027  WBC 13.3*  HGB 13.0  HCT 38.1  MCV 91.6  PLT 175   Cardiac Enzymes: No results for input(s): CKTOTAL, CKMB, CKMBINDEX, TROPONINI in the last 168 hours.  BNP (last 3 results)  Recent Labs  10/30/14 2027  BNP 308.5*    ProBNP (last 3 results) No results for input(s): PROBNP in the last 8760 hours.  CBG: No results for input(s): GLUCAP in the last 168 hours.  Radiological Exams on Admission: No results found.  EKG: Independently reviewed. EKG showed old left axis deviation, occasional PVC and nonspecific T-wave flattening.  Assessment/Plan Principal Problem:   Chest pain Active Problems:   Degeneration of lumbar or lumbosacral intervertebral disc   Endometrial cancer   Diastolic congestive heart failure   Hypothyroidism   Chest pain at rest   Leukocytosis  Chest pain: Patient's chest pain is very atypical. Currently her chest pain has almost resolved. Will admit for chest  pain rule out. Patient is taking raloxifene, which slightly increased risk of DVT or pulmonary embolism, but currently patient does not any shortness of breath and no signs of DVT, making PE unlikely to diagnosis. - will admit to Tele bed  - cycle CE q6 x3 and repeat her EKG in the am  - Nitroglycerin, Morphine, and aspirin, lipitor  - Risk factor stratification: will check FLP,TSH and A1C  - 2d echo  Diastolic congestive heart failure: 2-D echo on 12/12/13 showed EF 62 seat 5% with grade 2 diastolic dysfunction. Patient is on Lasix 20 mg daily at home. She is mildly fluid fluid overloaded with 1+ pitting leg edema on admission. BNP 308. -Will increase Lasix dose from 20 mg to 40 mg daily -Continue aspirin  Hypothyroidism: No TSH on record -Continue Synthroid -Check TSH  Leukocytosis: Unclear etiology. Patient has mild chronic dry cough. -Chest x-ray  -follow-up blood culture and urinalysis  History of endometrial cancer: Post  status of surgery and radiation therapy 2015. Patient has been followed up with Dr. Garnette Scheuermann at [facility]. Last seen 3 months ago.  -follow-up with oncologist.   DVT ppx: SQ Heparin         Code Status: Full code Family Communication:  Yes, patient's   daught    at bed side Disposition Plan: Admit to inpatient   Date of Service 10/31/2014    Ivor Costa [facility] Pager [phone]  If 7PM-7AM, please contact night-coverage [email or web address] Password [email or web address] 10/31/2014, 1:49 AM

## 2014-10-31 ENCOUNTER — Encounter (HOSPITAL_COMMUNITY): Payer: Self-pay | Admitting: Internal Medicine

## 2014-10-31 ENCOUNTER — Observation Stay (HOSPITAL_COMMUNITY): Payer: Medicare Other

## 2014-10-31 DIAGNOSIS — I10 Essential (primary) hypertension: Secondary | ICD-10-CM | POA: Diagnosis not present

## 2014-10-31 DIAGNOSIS — R0600 Dyspnea, unspecified: Secondary | ICD-10-CM | POA: Diagnosis not present

## 2014-10-31 DIAGNOSIS — C541 Malignant neoplasm of endometrium: Secondary | ICD-10-CM

## 2014-10-31 DIAGNOSIS — E038 Other specified hypothyroidism: Secondary | ICD-10-CM

## 2014-10-31 DIAGNOSIS — D72829 Elevated white blood cell count, unspecified: Secondary | ICD-10-CM

## 2014-10-31 DIAGNOSIS — I5032 Chronic diastolic (congestive) heart failure: Secondary | ICD-10-CM

## 2014-10-31 DIAGNOSIS — R609 Edema, unspecified: Secondary | ICD-10-CM | POA: Diagnosis not present

## 2014-10-31 DIAGNOSIS — R079 Chest pain, unspecified: Principal | ICD-10-CM

## 2014-10-31 DIAGNOSIS — K219 Gastro-esophageal reflux disease without esophagitis: Secondary | ICD-10-CM

## 2014-10-31 LAB — COMPREHENSIVE METABOLIC PANEL
ALBUMIN: 2.9 g/dL — AB (ref 3.5–5.2)
ALT: 24 U/L (ref 0–35)
AST: 26 U/L (ref 0–37)
Alkaline Phosphatase: 50 U/L (ref 39–117)
Anion gap: 8 (ref 5–15)
BUN: 17 mg/dL (ref 6–23)
CALCIUM: 8.9 mg/dL (ref 8.4–10.5)
CO2: 27 mmol/L (ref 19–32)
Chloride: 107 mmol/L (ref 96–112)
Creatinine, Ser: 0.73 mg/dL (ref 0.50–1.10)
GFR calc Af Amer: >90 mL/min (ref >90)
GFR calc non Af Amer: 82 mL/min — ABNORMAL LOW (ref >90)
Glucose, Bld: 94 mg/dL (ref 70–99)
POTASSIUM: 3.6 mmol/L (ref 3.5–5.1)
Sodium: 142 mmol/L (ref 135–145)
TOTAL PROTEIN: 5 g/dL — AB (ref 6.0–8.3)
Total Bilirubin: 1.4 mg/dL — ABNORMAL HIGH (ref 0.3–1.2)

## 2014-10-31 LAB — TROPONIN I: Troponin I: <0.03 ng/mL (ref <0.031)

## 2014-10-31 LAB — PROTIME-INR
INR: 1.13 (ref 0.00–1.49)
PROTHROMBIN TIME: 14.6 s (ref 11.6–15.2)

## 2014-10-31 LAB — GLUCOSE, CAPILLARY: GLUCOSE-CAPILLARY: 98 mg/dL (ref 70–99)

## 2014-10-31 LAB — URINALYSIS, ROUTINE W REFLEX MICROSCOPIC
Bilirubin Urine: NEGATIVE
GLUCOSE, UA: NEGATIVE mg/dL
HGB URINE DIPSTICK: NEGATIVE
KETONES UR: NEGATIVE mg/dL
Leukocytes, UA: NEGATIVE
Nitrite: NEGATIVE
PH: 6.5 (ref 5.0–8.0)
PROTEIN: NEGATIVE mg/dL
SPECIFIC GRAVITY, URINE: 1.021 (ref 1.005–1.030)
Urobilinogen, UA: 0.2 mg/dL (ref 0.0–1.0)

## 2014-10-31 LAB — CBC
HEMATOCRIT: 36.4 % (ref 36.0–46.0)
Hemoglobin: 12 g/dL (ref 12.0–15.0)
MCH: 30.5 pg (ref 26.0–34.0)
MCHC: 33 g/dL (ref 30.0–36.0)
MCV: 92.6 fL (ref 78.0–100.0)
PLATELETS: 183 10*3/uL (ref 150–400)
RBC: 3.93 MIL/uL (ref 3.87–5.11)
RDW: 12.7 % (ref 11.5–15.5)
WBC: 8.1 10*3/uL (ref 4.0–10.5)

## 2014-10-31 LAB — D-DIMER, QUANTITATIVE: D-Dimer, Quant: 0.48 ug/mL-FEU (ref 0.00–0.48)

## 2014-10-31 LAB — LIPID PANEL
CHOL/HDL RATIO: 2.8 ratio
CHOLESTEROL: 147 mg/dL (ref 0–200)
HDL: 52 mg/dL (ref >39)
LDL CALC: 87 mg/dL (ref 0–99)
Triglycerides: 38 mg/dL (ref <150)
VLDL: 8 mg/dL (ref 0–40)

## 2014-10-31 LAB — MRSA PCR SCREENING: MRSA by PCR: NEGATIVE

## 2014-10-31 LAB — TSH: TSH: 0.383 u[IU]/mL (ref 0.350–4.500)

## 2014-10-31 LAB — MAGNESIUM: Magnesium: 1.9 mg/dL (ref 1.5–2.5)

## 2014-10-31 MED ORDER — FUROSEMIDE 40 MG PO TABS
40.0000 mg | ORAL_TABLET | Freq: Every day | ORAL | Status: DC
Start: 2014-10-31 — End: 2014-10-31
  Administered 2014-10-31: 40 mg via ORAL
  Filled 2014-10-31: qty 1

## 2014-10-31 MED ORDER — ACETAMINOPHEN 325 MG PO TABS: 650.0000 mg | ORAL_TABLET | Freq: Four times a day (QID) | ORAL | Status: DC | PRN

## 2014-10-31 MED ORDER — DIFLUPREDNATE 0.05 % OP EMUL
1.0000 [drp] | Freq: Four times a day (QID) | OPHTHALMIC | Status: DC
  Administered 2014-10-31 (×3): 1 [drp] via OPHTHALMIC

## 2014-10-31 MED ORDER — NAPROXEN 250 MG PO TABS
220.0000 mg | ORAL_TABLET | Freq: Two times a day (BID) | ORAL | Status: DC
  Filled 2014-10-31 (×4): qty 1

## 2014-10-31 MED ORDER — OMEPRAZOLE MAGNESIUM 20 MG PO TBEC: 20.0000 mg | DELAYED_RELEASE_TABLET | Freq: Every day | ORAL | Status: DC

## 2014-10-31 MED ORDER — DIFLUPREDNATE 0.05 % OP EMUL: 1.0000 [drp] | Freq: Four times a day (QID) | OPHTHALMIC | Status: DC

## 2014-10-31 MED ORDER — LEVOTHYROXINE SODIUM 112 MCG PO TABS
112.0000 ug | ORAL_TABLET | Freq: Every day | ORAL | Status: DC
  Administered 2014-10-31: 112 ug via ORAL
  Filled 2014-10-31 (×2): qty 1

## 2014-10-31 MED ORDER — NITROGLYCERIN 0.4 MG SL SUBL: 0.4000 mg | SUBLINGUAL_TABLET | SUBLINGUAL | Status: DC | PRN

## 2014-10-31 MED ORDER — MOXIFLOXACIN HCL 0.5 % OP SOLN
1.0000 [drp] | Freq: Four times a day (QID) | OPHTHALMIC | Status: DC
  Administered 2014-10-31 (×3): 1 [drp] via OPHTHALMIC

## 2014-10-31 MED ORDER — VITAMIN D 1000 UNITS PO TABS
2000.0000 [IU] | ORAL_TABLET | Freq: Every day | ORAL | Status: DC
  Administered 2014-10-31: 2000 [IU] via ORAL
  Filled 2014-10-31: qty 2

## 2014-10-31 MED ORDER — MORPHINE SULFATE 2 MG/ML IJ SOLN: 2.0000 mg | INTRAMUSCULAR | Status: DC | PRN

## 2014-10-31 MED ORDER — ACETAMINOPHEN 650 MG RE SUPP: 650.0000 mg | Freq: Four times a day (QID) | RECTAL | Status: DC | PRN

## 2014-10-31 MED ORDER — ASPIRIN EC 81 MG PO TBEC
81.0000 mg | DELAYED_RELEASE_TABLET | Freq: Every day | ORAL | Status: DC
  Filled 2014-10-31: qty 1

## 2014-10-31 MED ORDER — ALUM & MAG HYDROXIDE-SIMETH 200-200-20 MG/5ML PO SUSP: 30.0000 mL | Freq: Four times a day (QID) | ORAL | Status: DC | PRN

## 2014-10-31 MED ORDER — OMEGA-3-ACID ETHYL ESTERS 1 G PO CAPS
1.0000 | ORAL_CAPSULE | Freq: Every day | ORAL | Status: DC
  Administered 2014-10-31: 1 g via ORAL
  Filled 2014-10-31: qty 1

## 2014-10-31 MED ORDER — SODIUM CHLORIDE 0.9 % IJ SOLN
3.0000 mL | Freq: Two times a day (BID) | INTRAMUSCULAR | Status: DC
  Administered 2014-10-31 (×2): 3 mL via INTRAVENOUS

## 2014-10-31 MED ORDER — HEPARIN SODIUM (PORCINE) 5000 UNIT/ML IJ SOLN
5000.0000 [IU] | Freq: Three times a day (TID) | INTRAMUSCULAR | Status: DC
  Administered 2014-10-31 (×2): 5000 [IU] via SUBCUTANEOUS
  Filled 2014-10-31 (×4): qty 1

## 2014-10-31 MED ORDER — PANTOPRAZOLE SODIUM 40 MG PO TBEC
40.0000 mg | DELAYED_RELEASE_TABLET | Freq: Every day | ORAL | Status: DC
  Administered 2014-10-31: 40 mg via ORAL
  Filled 2014-10-31: qty 1

## 2014-10-31 MED ORDER — MOXIFLOXACIN HCL 0.5 % OP SOLN
1.0000 [drp] | Freq: Four times a day (QID) | OPHTHALMIC | Status: DC
  Filled 2014-10-31: qty 3

## 2014-10-31 MED ORDER — RALOXIFENE HCL 60 MG PO TABS
60.0000 mg | ORAL_TABLET | Freq: Every day | ORAL | Status: DC
Start: 2014-10-31 — End: 2014-10-31
  Administered 2014-10-31: 60 mg via ORAL
  Filled 2014-10-31: qty 1

## 2014-10-31 MED ORDER — ATORVASTATIN CALCIUM 20 MG PO TABS
20.0000 mg | ORAL_TABLET | Freq: Every day | ORAL | Status: DC
  Filled 2014-10-31: qty 1

## 2014-10-31 NOTE — Progress Notes (Signed)
Bilateral lower extremity venous duplex completed:  No evidence of DVT, superficial thrombosis, or Baker's cyst.   

## 2014-10-31 NOTE — Progress Notes (Signed)
  Echocardiogram 2D Echocardiogram has been performed.  Terri Ortega 10/31/2014, 4:33 PM

## 2014-10-31 NOTE — Progress Notes (Signed)
Pt arrived to unit. VSS. Assessment charted in EPIC. RN will cont to monitor and follow orders.

## 2014-10-31 NOTE — Progress Notes (Signed)
Report received from Chelsea,RN

## 2014-10-31 NOTE — Progress Notes (Signed)
Pt discharge education and instructions completed with pt and spouse at bedside. All voices understanding and denies any questions. Pt IV and telemetry removed. Pt discharge home with spouse to transport her home. Pt to pick up electronically sent prescription from preferred pharmacy on file. Pt transported off unit via wheelchair with spouse and belongings to the side. Francis Gaines Lazer Wollard RN.

## 2014-10-31 NOTE — Progress Notes (Signed)
UR completed 

## 2014-10-31 NOTE — Discharge Summary (Signed)
Discharge Summary  Terri Ortega HUD:[id number] DOB: October 01, 1940  PCP: Kandice Hams, MD  Admit date: 10/30/2014 Discharge date: 10/31/2014  Time spent: >83mins  Recommendations for Outpatient Follow-up:  1. F/u with PMD in one week for post hospital follow up 2. F/u with cardiology in three weeks to discuss outpatient stress test. F/u on echocardiogram result.  Discharge Diagnoses:  Active Hospital Problems   Diagnosis Date Noted  . Chest pain 10/30/2014  . Leukocytosis 10/31/2014  . Diastolic congestive heart failure 10/30/2014  . Hypothyroidism 10/30/2014  . Chest pain at rest 10/30/2014  . Endometrial cancer 01/30/2014  . Degeneration of lumbar or lumbosacral intervertebral disc 12/13/2013    Resolved Hospital Problems   Diagnosis Date Noted Date Resolved  No resolved problems to display.    Discharge Condition: stable  Diet recommendation: heart healthy  Filed Weights   10/30/14 2004 10/31/14 0500  Weight: 77.111 kg (170 lb) 77.111 kg (170 lb)    History of present illness:  Terri Ortega is a 74 y.o. female With past medical history of hypertension, hypothyroidism, endometrial cance 2015 (post status of radiation and surgery), diastolic congestive heart failure (EF of 60-65% with grade 2 diastolic dysfunction), who presents with the chest pain.  Patient reports that her chest pain started at about 11 AM. It is located in the substernal area, pressure-like, mild, persistent, nonradiating. It is associated with shortness of breath, diaphoresis and nausea. She vomited twice without blood in vomitus. She reports having chronic mild dry cough, which has not changed. Patient is not taking ACEI. Never smoked. She has bilateral leg edema. Patient denies fever, chills, fatigue, headaches, abdominal pain, diarrhea, constipation, dysuria, urgency, frequency, hematuria, skin rashes. No unilateral weakness, numbness or tingling sensations. No vision change or hearing  loss.  In ED, patient was found to have leukocytosis with every BC 13.3, temperature normal, no tachycardia, electrolytes okay, BNP is 308, negative troponin. EKG showed old left axis deviation, occasional PVC and nonspecific T-wave flattening. Patient is admitted to inpatient for further evaluation and treatment.  Hospital Course:  Principal Problem:   Chest pain Active Problems:   Degeneration of lumbar or lumbosacral intervertebral disc   Endometrial cancer   Diastolic congestive heart failure   Hypothyroidism   Chest pain at rest   Leukocytosis Chest pain: Patient's chest pain is very atypical. Currently her chest pain has almost resolved. From acid reflux?  -Patient is taking raloxifene, which slightly increased risk of DVT or pulmonary embolism, but currently patient does not any shortness of breath and no signs of DVT, making PE unlikely to diagnosis. D-dimer wnl. Negative lower extremity venous duplex. - Tele sinus rhythm/ occasionally pvc's,  - cycle CE q6 x3 negative repeat her EKG in the am no interval changes -lipid panel /tsh wnl,  - 2d echo result pending, patient would like to be discharged home, instructed patient to close follow up with cardiology, discuss outpatient stress test, she should return to ED if chest pain returns. Patient expressed understanding.  Diastolic congestive heart failure: 2-D echo on 12/12/13 showed EF 62 seat 5% with grade 2 diastolic dysfunction. Patient is on Lasix 20 mg daily at home. She is mildly fluid fluid overloaded with 1+ pitting leg edema on admission. BNP 308. -received higher dose of lasix in the hospital 40 mg daily -Continue aspirin, continue home dose lasix at discharge.  Hypothyroidism:  -Continue Synthroid -TSH wnl.  Leukocytosis:  Resolved, from stress? Mild dehydration?  -Chest x-ray no acute findings, ua unremarkable  History of endometrial cancer: Post status of surgery and radiation therapy 2015. Patient has been  followed up with Dr. Garnette Scheuermann at [facility]. Last seen 3 months ago. -follow-up with oncologist.  Acid reflux, with chronic night time cough and hoarseness. omeprazole trial for two weeks.   Code Status: Full code Family Communication: Yes, patient's daught at bed side  Procedures:  Echocardiogram, result pending  Consultations:  none  Discharge Exam: BP 149/55 mmHg  Pulse 70  Temp(Src) 98.2 F (36.8 C) (Oral)  Resp 18  Ht 5\' 4"  (1.626 m)  Wt 77.111 kg (170 lb)  BMI 29.17 kg/m2  SpO2 97%   General: Not in acute distress HEENT:  Eyes: PERRL, EOMI, no scleral icterus  ENT: No discharge from the ears and nose, no pharynx injection, no tonsillar enlargement.   Neck: No JVD, no bruit, no mass felt. Cardiac: S1/S2, RRR, No murmurs, No gallops or rubs Pulm: Good air movement bilaterally. Clear to auscultation bilaterally. No rales, wheezing, rhonchi or rubs. Abd: Soft, nondistended, nontender, no rebound pain, no organomegaly, BS present Ext: 1+pitting leg edema bilaterally. 2+DP/PT pulse bilaterally Musculoskeletal: No joint deformities, erythema, or stiffness, ROM full Skin: No rashes.  Neuro: Alert and oriented X3, cranial nerves II-XII grossly intact, muscle strength 5/5 in all extremeties, sensation to light touch intact. B Psych: Patient is not psychotic, no suicidal or hemocidal ideation.   Discharge Instructions You were cared for by a hospitalist during your hospital stay. If you have any questions about your discharge medications or the care you received while you were in the hospital after you are discharged, you can call the unit and asked to speak with the hospitalist on call if the hospitalist that took care of you is not available. Once you are discharged, your primary care physician will handle any further medical issues. Please note that NO REFILLS for any discharge medications will be authorized once you are discharged, as it is imperative that  you return to your primary care physician (or establish a relationship with a primary care physician if you do not have one) for your aftercare needs so that they can reassess your need for medications and monitor your lab values.  Discharge Instructions    Diet - low sodium heart healthy    Complete by:  As directed      Increase activity slowly    Complete by:  As directed             Medication List    STOP taking these medications        naproxen sodium 220 MG tablet  Commonly known as:  ANAPROX      TAKE these medications        aspirin EC 81 MG tablet  Take 81 mg by mouth daily.     Difluprednate 0.05 % Emul  Apply 1 drop to eye 4 (four) times daily. Apply 1 drop to right eye four times a day     furosemide 20 MG tablet  Commonly known as:  LASIX  Take 20 mg by mouth daily.     moxifloxacin 0.5 % ophthalmic solution  Commonly known as:  VIGAMOX  Place 1 drop into the right eye 4 (four) times daily.     Omega-3 1000 MG Caps  Take by mouth.     omeprazole 20 MG tablet  Commonly known as:  PRILOSEC OTC  Take 1 tablet (20 mg total) by mouth daily.     potassium chloride 10 MEQ tablet  Commonly known as:  K-DUR  Take 10 mEq by mouth every other day.     raloxifene 60 MG tablet  Commonly known as:  EVISTA  daily.     SYNTHROID 112 MCG tablet  Generic drug:  levothyroxine  Take by mouth daily before breakfast.     Vitamin D3 2000 UNITS capsule  Take by mouth daily.       No Known Allergies     Follow-up Information    Follow up with Minus Breeding, MD In 3 weeks.   Specialty:  Cardiology   Why:  outpatient stress test for chest pain   Contact information:   [address] [address] [phone]       Follow up with Kandice Hams, MD In 1 week.   Specialty:  Internal Medicine   Why:  post hospital follow up, repeat cbc/cmp in 1week.   Contact information:   [address] [place]  [address] [phone]        The results of significant diagnostics from this hospitalization (including imaging, microbiology, ancillary and laboratory) are listed below for reference.    Significant Diagnostic Studies: Dg Chest 2 View  10/31/2014   CLINICAL DATA:  Chest pain and cough with history of CHF, nonsmoker.  EXAM: CHEST  2 VIEW  COMPARISON:  None.  FINDINGS: The lungs are mildly hyperinflated. The interstitial markings are coarse especially in the lower lobes. The cardiac silhouette is mildly enlarged. The pulmonary vascularity is normal. There is mild tortuosity of the descending thoracic aorta. There is no pleural effusion or pneumothorax. The bony thorax exhibits no acute abnormality. There is mild multilevel degenerative disc disease of the thoracic spine.  IMPRESSION: 1. COPD.  Bibasilar subsegmental atelectasis is suspected. 2. Cardiomegaly without evidence of pulmonary edema.   Electronically Signed   By: David  Martinique   On: 10/31/2014 08:04    Microbiology: Recent Results (from the past 240 hour(s))  MRSA PCR Screening     Status: None   Collection Time: 10/31/14  1:38 AM  Result Value Ref Range Status   MRSA by PCR NEGATIVE NEGATIVE Final    Comment:        The GeneXpert MRSA Assay (FDA approved for NASAL specimens only), is one component of a comprehensive MRSA colonization surveillance program. It is not intended to diagnose MRSA infection nor to guide or monitor treatment for MRSA infections.      Labs: Basic Metabolic Panel:  Recent Labs Lab 10/30/14 2027 10/31/14 0659 10/31/14 1416  NA 140 142  --   K 3.8 3.6  --   CL 105 107  --   CO2 24 27  --   GLUCOSE 119* 94  --   BUN 23 17  --   CREATININE 0.85 0.73  --   CALCIUM 9.2 8.9  --   MG  --   --  1.9   Liver Function Tests:  Recent Labs Lab 10/31/14 0659  AST 26  ALT 24  ALKPHOS 50  BILITOT 1.4*  PROT 5.0*  ALBUMIN 2.9*   No results for input(s): LIPASE, AMYLASE in the last 168  hours. No results for input(s): AMMONIA in the last 168 hours. CBC:  Recent Labs Lab 10/30/14 2027 10/31/14 0659  WBC 13.3* 8.1  HGB 13.0 12.0  HCT 38.1 36.4  MCV 91.6 92.6  PLT 175 183   Cardiac Enzymes:  Recent Labs Lab 10/31/14 0235 10/31/14 0659 10/31/14 1416  TROPONINI <  0.03 <0.03 <0.03   BNP: BNP (last 3 results)  Recent Labs  10/30/14 2027  BNP 308.5*    ProBNP (last 3 results) No results for input(s): PROBNP in the last 8760 hours.  CBG:  Recent Labs Lab 10/31/14 0614  GLUCAP 98       Signed:  Jovi Zavadil MD, PhD  [facility] 10/31/2014, 6:38 PM

## 2014-11-01 LAB — HEMOGLOBIN A1C
Hgb A1c MFr Bld: 5.5 % (ref 4.8–5.6)
Mean Plasma Glucose: 111 mg/dL

## 2014-11-06 LAB — CULTURE, BLOOD (ROUTINE X 2)
Culture: NO GROWTH
Culture: NO GROWTH

## 2014-11-08 ENCOUNTER — Other Ambulatory Visit (HOSPITAL_COMMUNITY)
Admission: RE | Admit: 2014-11-08 | Discharge: 2014-11-08 | Disposition: A | Discharge status: Home / Self Care | Payer: Medicare Other | Source: Ambulatory Visit | Attending: Radiation Oncology | Admitting: Radiation Oncology

## 2014-11-08 ENCOUNTER — Encounter: Payer: Self-pay | Admitting: Radiation Oncology

## 2014-11-08 ENCOUNTER — Ambulatory Visit
Admission: RE | Admit: 2014-11-08 | Discharge: 2014-11-08 | Disposition: A | Discharge status: Home / Self Care | Payer: Medicare Other | Source: Ambulatory Visit | Attending: Radiation Oncology | Admitting: Radiation Oncology

## 2014-11-08 VITALS — BP 122/66 | HR 62 | Temp 97.9°F | Resp 20 | Wt 172.9 lb

## 2014-11-08 DIAGNOSIS — C541 Malignant neoplasm of endometrium: Secondary | ICD-10-CM

## 2014-11-08 DIAGNOSIS — Z01411 Encounter for gynecological examination (general) (routine) with abnormal findings: Secondary | ICD-10-CM | POA: Diagnosis present

## 2014-11-08 NOTE — Progress Notes (Signed)
Radiation Oncology         ([phone]) [phone] ________________________________  Name: Terri Ortega MRN: [id number]  Date: 11/08/2014  DOB: January 04, 1941  Follow-Up Visit Note  CC: Kandice Hams, MD  Nancy Marus, MD    ICD-9-CM ICD-10-CM   1. 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 Patient met high/intermed risk factors based on outer half (65%) myometrial invasion in a woman older than 70. Dispositioned to receive adjuvant vaginal brachytherapy to reduce the risk for recurrence at the vaginal cuff.     Endometrial cancer   12/29/2013 Initial Diagnosis Endometrial cancer. Grade 1. Hysteroscopy D&C   01/30/2014 Surgery robotic hyst, bso, pelvic and PA node dissection at [facility] with Dr Nancy Marus    Interval Since Last Radiation:  7  months  Narrative:  The patient returns today for routine follow-up.  Patient is doing well at this time concerning her gynecologic malignancy. She did experience some chest pain a few weeks ago and presented to the emergency room. According to patient she was diagnosed with GERD. Patient will follow-up with her primary care physician the next several days concerning this issue. She denies any further chest pain. Patient denies any vaginal bleeding pelvic pain hematuria or rectal bleeding. She has noticed occasional problems with rectal urgency but no incontinence. Patient continues to be sexually active.  She is using her dilator approximately twice per month                              ALLERGIES:  has No Known Allergies.  Meds: Current Outpatient Prescriptions  Medication Sig Dispense Refill  . aspirin EC 81 MG tablet Take 81 mg by mouth daily.     . Cholecalciferol (VITAMIN D3) 2000 UNITS capsule Take by mouth daily.     . Difluprednate 0.05 % EMUL Apply 1 drop to eye 4 (four)  times daily. Apply 1 drop to right eye four times a day    . furosemide (LASIX) 20 MG tablet Take 20 mg by mouth daily.     Marland Kitchen levothyroxine (SYNTHROID) 112 MCG tablet Take by mouth daily before breakfast.     . moxifloxacin (VIGAMOX) 0.5 % ophthalmic solution Place 1 drop into the right eye 4 (four) times daily.    . naproxen sodium (ANAPROX) 220 MG tablet Take 220 mg by mouth as needed.    . Omega-3 1000 MG CAPS Take 1,000 capsules by mouth daily.     Marland Kitchen omeprazole (PRILOSEC OTC) 20 MG tablet Take 1 tablet (20 mg total) by mouth daily. 14 tablet 0  . potassium chloride (K-DUR) 10 MEQ tablet Take 10 mEq by mouth every other day.     . raloxifene (EVISTA) 60 MG tablet daily.      No current facility-administered medications for this encounter.    Physical Findings: The patient is in no acute distress. Patient is alert and oriented.  weight is 172 lb 14.4 oz (78.427 kg). Her oral temperature is 97.9 F (36.6 C). Her blood pressure is 122/66 and her pulse is 62. Her respiration is 20 and oxygen saturation is 100%. .  No palpable subclavicular or axillary adenopathy. The lungs are clear to auscultation. The heart has a regular rhythm and rate. The abdomen is soft and nontender with normal  bowel sounds. No inguinal adenopathy is appreciated. On pelvic examination the external genitalia are unremarkable. A speculum exam is performed. There are no mucosal lesions noted in the vaginal vault. A Pap smear was obtained of the proximal vagina. On bimanual and rectovaginal examination there no pelvic masses appreciated. Rectal sphincter tone is good    Lab Findings: Lab Results  Component Value Date   WBC 8.1 10/31/2014   HGB 12.0 10/31/2014   HCT 36.4 10/31/2014   MCV 92.6 10/31/2014   PLT 183 10/31/2014    Radiographic Findings: Dg Chest 2 View  10/31/2014   CLINICAL DATA:  Chest pain and cough with history of CHF, nonsmoker.  EXAM: CHEST  2 VIEW  COMPARISON:  None.  FINDINGS: The lungs are  mildly hyperinflated. The interstitial markings are coarse especially in the lower lobes. The cardiac silhouette is mildly enlarged. The pulmonary vascularity is normal. There is mild tortuosity of the descending thoracic aorta. There is no pleural effusion or pneumothorax. The bony thorax exhibits no acute abnormality. There is mild multilevel degenerative disc disease of the thoracic spine.  IMPRESSION: 1. COPD.  Bibasilar subsegmental atelectasis is suspected. 2. Cardiomegaly without evidence of pulmonary edema.   Electronically Signed   By: David  Martinique   On: 10/31/2014 08:04    Impression:  No evidence of recurrence on clinical exam today, Pap smear pending  Plan:  Routine follow-up in radiation oncology in 6 months. In the interim the patient will be seen by Dr. Denman George  ____________________________________ Blair Promise, MD

## 2014-11-08 NOTE — Progress Notes (Signed)
Pap specimen collected by MD and sent specimen to lab, d/c patient home with 6 month f/u appt card 11:36 AM

## 2014-11-08 NOTE — Progress Notes (Signed)
Follow up s/p rad txs endometrial adenocarcinoma 03/20/14; 03/27/14; 04/05/14; 04/12/14; 04/17/14/30Gy proximal vagina  Patient denies any vaginal discharge or bleeding, uses her dilator once or twice a month only stated, last Pap by Dr. Nori Riis at [facility] she believes late April or May 2015 Was hospitalized 10/30/14-10/31/14 for chest pain,c/o low back pain an 8/10 scale , takes Aleve prn, appetite good, energy level  Fair  Regular bowel movements  11:12 AM

## 2014-11-09 DIAGNOSIS — I519 Heart disease, unspecified: Secondary | ICD-10-CM | POA: Insufficient documentation

## 2014-11-12 LAB — CYTOLOGY - PAP

## 2014-11-14 ENCOUNTER — Telehealth: Payer: Self-pay | Admitting: Oncology

## 2014-11-14 NOTE — Telephone Encounter (Signed)
Called Terri Ortega and advised her that her pap smear was good per Dr. Sondra Come.  Terri Ortega verbalized understanding.

## 2014-11-16 ENCOUNTER — Encounter: Payer: Self-pay | Admitting: Cardiology

## 2014-11-16 ENCOUNTER — Ambulatory Visit (INDEPENDENT_AMBULATORY_CARE_PROVIDER_SITE_OTHER): Payer: Medicare Other | Admitting: Cardiology

## 2014-11-16 VITALS — BP 130/70 | HR 60 | Ht 64.0 in | Wt 173.6 lb

## 2014-11-16 DIAGNOSIS — R0602 Shortness of breath: Secondary | ICD-10-CM | POA: Diagnosis not present

## 2014-11-16 NOTE — Patient Instructions (Signed)
Your physician wants you to follow-up in: 6 Months You will receive a reminder letter in the mail two months in advance. If you don't receive a letter, please call our office to schedule the follow-up appointment.  Your physician has requested that you have en exercise stress myoview. For further information please visit [email or web address]. Please follow instruction sheet, as given.

## 2014-11-16 NOTE — Progress Notes (Signed)
HPI The patient presents for evaluation of a mildly reduced ejection fraction. I had seen her in the past for evaluation of dyspnea. She was hospitalized in March and I reviewed these hospital records. She had some pain in her upper back. She had some shortness of breath. The etiology of this was not clear. He was discharged before she had the results of an echocardiogram. I have reviewed this and it demonstrated that her ejection fraction which had been 60-65% was thought to be globally reduced at 45%. Since getting out of the hospital she's felt well. Her medications were changed so she apparently got a slightly higher dose of diuretic in the hospital.   Of note she did have a mildly elevated BNP in the hospital. She's had no new shortness of breath since getting home and has had no PND or orthopnea. She's not having any chest pressure, neck or arm discomfort. She has some rare palpitations at night but no presyncope or syncope.  No Known Allergies  Current Outpatient Prescriptions  Medication Sig Dispense Refill  . aspirin EC 81 MG tablet Take 81 mg by mouth daily.     . Cholecalciferol (VITAMIN D3) 2000 UNITS capsule Take by mouth daily.     . Difluprednate 0.05 % EMUL Apply 1 drop to eye daily. Apply 1 drop to right eye daily    . furosemide (LASIX) 20 MG tablet Take 20 mg by mouth daily.     Marland Kitchen levothyroxine (SYNTHROID) 112 MCG tablet Take by mouth daily before breakfast.     . moxifloxacin (VIGAMOX) 0.5 % ophthalmic solution Place 1 drop into the right eye 4 (four) times daily.    . Omega-3 1000 MG CAPS Take 1,000 capsules by mouth daily.     . potassium chloride (K-DUR) 10 MEQ tablet Take 10 mEq by mouth every other day.     . raloxifene (EVISTA) 60 MG tablet daily.      No current facility-administered medications for this visit.    Past Medical History  Diagnosis Date  . HTN (hypertension)   . Hypothyroid   . Graves disease   . Osteoporosis   . Uterine cancer   . Scoliosis     . Radiation 03/20/14, 03/27/14, 04/05/14, 04/12/14, 04/17/14    bracytherapy to proximal vagina 30 gray  . CHF (congestive heart failure)     Past Surgical History  Procedure Laterality Date  . Total knee arthroplasty      right  . Bunionectomy      left  . Robotic assisted total hysterectomy with bilateral salpingo oopherectomy  01/30/14    with lymph node biopsy    ROS:    Otherwise, as stated in the HPI and negative for all other systems.  PHYSICAL EXAM BP 130/70 mmHg  Pulse 60  Ht 5\' 4"  (1.626 m)  Wt 173 lb 9.6 oz (78.744 kg)  BMI 29.78 kg/m2 GENERAL:  Well appearing HEENT:  Pupils equal round and reactive, fundi not visualized, oral mucosa unremarkable NECK:  No jugular venous distention, waveform within normal limits, carotid upstroke brisk and symmetric, no bruits, no thyromegaly LYMPHATICS:  No cervical, inguinal adenopathy LUNGS:  Clear to auscultation bilaterally BACK:  No CVA tenderness CHEST:  Unremarkable HEART:  PMI not displaced or sustained,S1 and S2 within normal limits, no S3, no S4, no clicks, no rubs, soft apical systolic murmur, no diastolic murmurs ABD:  Flat, positive bowel sounds normal in frequency in pitch, no bruits, no rebound, no guarding, no midline  pulsatile mass, no hepatomegaly, no splenomegaly EXT:  2 plus pulses throughout, no edema, no cyanosis no clubbing SKIN:  No rashes no nodules NEURO:  Cranial nerves II through XII grossly intact, motor grossly intact throughout PSYCH:  Cognitively intact, oriented to person place and time  EKG:  Normal sinus rhythm, rate 71, left axis deviation, left anterior fascicular block, poor anterior R wave progression, premature ectopic complexes, nonspecific diffuse T wave flattening, QTC slightly prolonged. 11/16/2014  ASSESSMENT AND PLAN  CARDIOMYOPATHY:  She had a mildly reduced ejection fraction on recent echo along with chest pain as described. Therefore, I will bring her back for an exercise Myoview to  exclude obstructive coronary disease. She will otherwise remain on the meds as listed.  PALPITATIONS:  She has some occasional palpitations and does have atrial ectopy on EKG but this is not particularly symptomatic. She will continue the meds as listed.  HTN:  The blood pressure is at target. No change in medications is indicated. We will continue with therapeutic lifestyle changes (TLC).  AORTIC VALVE DISEASE:  This is mild on the recent echo.  No change in therapy is indicated.

## 2014-11-27 ENCOUNTER — Encounter (HOSPITAL_COMMUNITY): Payer: Medicare Other

## 2014-12-05 ENCOUNTER — Telehealth (HOSPITAL_COMMUNITY): Payer: Self-pay

## 2014-12-05 NOTE — Telephone Encounter (Signed)
Encounter complete. 

## 2014-12-07 ENCOUNTER — Ambulatory Visit (HOSPITAL_COMMUNITY)
Admission: RE | Admit: 2014-12-07 | Discharge: 2014-12-07 | Disposition: A | Discharge status: Home / Self Care | Payer: Medicare Other | Source: Ambulatory Visit | Attending: Cardiology | Admitting: Cardiology

## 2014-12-07 DIAGNOSIS — R0602 Shortness of breath: Secondary | ICD-10-CM | POA: Diagnosis present

## 2014-12-07 MED ORDER — TECHNETIUM TC 99M SESTAMIBI GENERIC - CARDIOLITE
9.9000 | Freq: Once | INTRAVENOUS | Status: AC | PRN
  Administered 2014-12-07: 10 via INTRAVENOUS

## 2014-12-07 MED ORDER — TECHNETIUM TC 99M SESTAMIBI GENERIC - CARDIOLITE
31.5000 | Freq: Once | INTRAVENOUS | Status: AC | PRN
  Administered 2014-12-07: 32 via INTRAVENOUS

## 2014-12-11 ENCOUNTER — Encounter (HOSPITAL_COMMUNITY): Payer: Self-pay | Admitting: *Deleted

## 2014-12-12 LAB — MYOCARDIAL PERFUSION IMAGING
CHL CUP NUCLEAR SDS: 3
CHL CUP NUCLEAR SSS: 7
CHL CUP STRESS STAGE 1 DBP: 85 mmHg
CHL CUP STRESS STAGE 1 GRADE: 0 %
CHL CUP STRESS STAGE 1 SBP: 146 mmHg
CHL CUP STRESS STAGE 2 SPEED: 1 mph
CHL CUP STRESS STAGE 3 GRADE: 0.1 %
CHL CUP STRESS STAGE 4 GRADE: 10 %
CHL CUP STRESS STAGE 5 SPEED: 2.5 mph
CHL CUP STRESS STAGE 6 SPEED: 2.5 mph
CHL CUP STRESS STAGE 7 DBP: 79 mmHg
CHL CUP STRESS STAGE 7 GRADE: 0 %
CHL CUP STRESS STAGE 7 HR: 114 {beats}/min
CHL CUP STRESS STAGE 7 SPEED: 0 mph
CHL CUP STRESS STAGE 8 DBP: 83 mmHg
CHL CUP STRESS STAGE 8 GRADE: 0 %
CSEPED: 6 min
CSEPHR: 87 %
CSEPPHR: 127 {beats}/min
CSEPPMHR: 86 %
Estimated workload: 7 METS
Exercise duration (sec): 0 s
LV sys vol: 28 mL
LVDIAVOL: 88 mL
MPHR: 146 {beats}/min
NUC STRESS EF: 68 %
RPE: 25600
Rest HR: 55 {beats}/min
SRS: 4
Stage 1 HR: 65 {beats}/min
Stage 1 Speed: 0 mph
Stage 2 Grade: 0 %
Stage 2 HR: 71 {beats}/min
Stage 3 HR: 72 {beats}/min
Stage 3 Speed: 1 mph
Stage 4 HR: 102 {beats}/min
Stage 4 Speed: 1.7 mph
Stage 5 DBP: 93 mmHg
Stage 5 Grade: 12 %
Stage 5 HR: 127 {beats}/min
Stage 5 SBP: 115 mmHg
Stage 6 Grade: 12.1 %
Stage 6 HR: 127 {beats}/min
Stage 7 SBP: 100 mmHg
Stage 8 HR: 76 {beats}/min
Stage 8 SBP: 153 mmHg
Stage 8 Speed: 0 mph
TID: 0.94

## 2014-12-18 ENCOUNTER — Other Ambulatory Visit: Payer: Self-pay | Admitting: Gastroenterology

## 2014-12-19 ENCOUNTER — Encounter (HOSPITAL_COMMUNITY): Payer: Self-pay | Admitting: *Deleted

## 2014-12-20 ENCOUNTER — Ambulatory Visit: Payer: Medicare Other | Admitting: Cardiology

## 2014-12-23 NOTE — Anesthesia Preprocedure Evaluation (Addendum)
Anesthesia Evaluation  Patient identified by MRN, date of birth, ID band Patient awake    Reviewed: Allergy & Precautions, NPO status , Patient's Chart, lab work & pertinent test results, reviewed documented beta blocker date and time   Airway Mallampati: II   Neck ROM: Full    Dental  (+) Teeth Intact, Dental Advisory Given   Pulmonary  breath sounds clear to auscultation        Cardiovascular hypertension, Pt. on medications Rhythm:Regular  ECHO 10/2014 EF 45% diffuse hypokin, Stress 10/2014 EF 60% and no ischemia, EKG with trigeminy and RBB   Neuro/Psych negative neurological ROS  negative psych ROS   GI/Hepatic negative GI ROS, Neg liver ROS,   Endo/Other  Hypothyroidism   Renal/GU negative Renal ROS     Musculoskeletal   Abdominal (+)  Abdomen: soft.    Peds  Hematology   Anesthesia Other Findings   Reproductive/Obstetrics                           Anesthesia Physical Anesthesia Plan  ASA: III  Anesthesia Plan: MAC   Post-op Pain Management:    Induction: Intravenous  Airway Management Planned: Nasal Cannula  Additional Equipment:   Intra-op Plan:   Post-operative Plan:   Informed Consent: I have reviewed the patients History and Physical, chart, labs and discussed the procedure including the risks, benefits and alternatives for the proposed anesthesia with the patient or authorized representative who has indicated his/her understanding and acceptance.     Plan Discussed with:   Anesthesia Plan Comments:         Anesthesia Quick Evaluation

## 2014-12-25 ENCOUNTER — Ambulatory Visit (HOSPITAL_COMMUNITY): Payer: Medicare Other | Admitting: Anesthesiology

## 2014-12-25 ENCOUNTER — Encounter (HOSPITAL_COMMUNITY): Payer: Self-pay | Admitting: *Deleted

## 2014-12-25 ENCOUNTER — Ambulatory Visit (HOSPITAL_COMMUNITY)
Admission: RE | Admit: 2014-12-25 | Discharge: 2014-12-25 | Disposition: A | Discharge status: Home / Self Care | Payer: Medicare Other | Source: Ambulatory Visit | Attending: Gastroenterology | Admitting: Gastroenterology

## 2014-12-25 ENCOUNTER — Encounter (HOSPITAL_COMMUNITY)
Admission: RE | Disposition: A | Discharge status: Home / Self Care | Payer: Self-pay | Source: Ambulatory Visit | Attending: Gastroenterology

## 2014-12-25 DIAGNOSIS — K5730 Diverticulosis of large intestine without perforation or abscess without bleeding: Secondary | ICD-10-CM | POA: Diagnosis not present

## 2014-12-25 DIAGNOSIS — I10 Essential (primary) hypertension: Secondary | ICD-10-CM | POA: Diagnosis not present

## 2014-12-25 DIAGNOSIS — Z1211 Encounter for screening for malignant neoplasm of colon: Secondary | ICD-10-CM | POA: Diagnosis present

## 2014-12-25 DIAGNOSIS — M810 Age-related osteoporosis without current pathological fracture: Secondary | ICD-10-CM | POA: Diagnosis not present

## 2014-12-25 DIAGNOSIS — Z79899 Other long term (current) drug therapy: Secondary | ICD-10-CM | POA: Insufficient documentation

## 2014-12-25 DIAGNOSIS — E039 Hypothyroidism, unspecified: Secondary | ICD-10-CM | POA: Diagnosis not present

## 2014-12-25 DIAGNOSIS — D124 Benign neoplasm of descending colon: Secondary | ICD-10-CM | POA: Insufficient documentation

## 2014-12-25 DIAGNOSIS — Z8542 Personal history of malignant neoplasm of other parts of uterus: Secondary | ICD-10-CM | POA: Diagnosis not present

## 2014-12-25 HISTORY — PX: COLONOSCOPY WITH PROPOFOL: SHX5780

## 2014-12-25 HISTORY — DX: Reserved for inherently not codable concepts without codable children: IMO0001

## 2014-12-25 SURGERY — COLONOSCOPY WITH PROPOFOL
Anesthesia: Monitor Anesthesia Care

## 2014-12-25 MED ORDER — SODIUM CHLORIDE 0.9 % IV SOLN: INTRAVENOUS | Status: DC

## 2014-12-25 MED ORDER — PROPOFOL 10 MG/ML IV BOLUS
INTRAVENOUS | Status: AC
Start: 2014-12-25 — End: 2014-12-25
  Filled 2014-12-25: qty 20

## 2014-12-25 MED ORDER — PROMETHAZINE HCL 25 MG/ML IJ SOLN: 6.2500 mg | INTRAMUSCULAR | Status: DC | PRN

## 2014-12-25 MED ORDER — LACTATED RINGERS IV SOLN
INTRAVENOUS | Status: DC
  Administered 2014-12-25: 1000 mL via INTRAVENOUS

## 2014-12-25 MED ORDER — MEPERIDINE HCL 100 MG/ML IJ SOLN: 6.2500 mg | INTRAMUSCULAR | Status: DC | PRN

## 2014-12-25 MED ORDER — PROPOFOL 10 MG/ML IV BOLUS
INTRAVENOUS | Status: DC | PRN
  Administered 2014-12-25: 50 mg via INTRAVENOUS
  Administered 2014-12-25 (×2): 100 mg via INTRAVENOUS
  Administered 2014-12-25: 50 mg via INTRAVENOUS

## 2014-12-25 MED ORDER — PROPOFOL 10 MG/ML IV BOLUS
INTRAVENOUS | Status: AC
  Filled 2014-12-25: qty 20

## 2014-12-25 SURGICAL SUPPLY — 21 items

## 2014-12-25 NOTE — Discharge Instructions (Signed)

## 2014-12-25 NOTE — H&P (Signed)
  Procedure: Screening colonoscopy. A normal screening colonoscopy performed on 12/26/2004  History: The patient is a 74 year old female born 06-08-1941. She is scheduled to undergo a repeat screening colonoscopy today.  Past medical history: Hypertension. Hypothyroidism. Graves disease post-iodine ablation therapy. Osteoporosis. Hysterectomy for uterine cancer performed in June 2015. Diastolic heart dysfunction. Left foot surgery. Right knee replacement surgery. Total abdominal hysterectomy. Bilateral salpingo-oophorectomy.  Medication allergies: Percocet causes nausea  Exam: The patient is alert and lying comfortably on the endoscopy stretcher. Abdomen is soft and nontender to palpation. Lungs are clear to auscultation. Cardiac exam reveals a regular rhythm.  Plan: Proceed with screening colonoscopy

## 2014-12-25 NOTE — Anesthesia Postprocedure Evaluation (Signed)
  Anesthesia Post-op Note  Patient: Terri Ortega  Procedure(s) Performed: Procedure(s): COLONOSCOPY WITH PROPOFOL (N/A)  Patient Location: PACU  Anesthesia Type:MAC  Level of Consciousness: awake and alert   Airway and Oxygen Therapy: Patient Spontanous Breathing  Post-op Pain: none  Post-op Assessment: Post-op Vital signs reviewed, Patient's Cardiovascular Status Stable, Respiratory Function Stable, Patent Airway and No signs of Nausea or vomiting  Post-op Vital Signs: Reviewed and stable  Last Vitals:  Filed Vitals:   12/25/14 1235  BP: 144/62  Pulse: 61  Temp: 36.1 C  Resp: 17    Complications: No apparent anesthesia complications

## 2014-12-25 NOTE — Op Note (Signed)
Procedure: Screening colonoscopy. Normal screening colonoscopy performed on 12/26/2004  Endoscopist: Earle Gell  Premedication: Propofol administered by anesthesia  Procedure: The patient was placed in the left lateral decubitus position. Anal inspection and digital rectal exam were normal. The Pentax pediatric colonoscope was introduced into the rectum and advanced to the cecum. A normal-appearing appendiceal orifice was identified. A normal-appearing ileocecal valve was intubated and the terminal ileum inspected. Colonic preparation for the exam today was good. Withdrawal time was 13 minutes  Rectum. Normal. Retroflexed view of the distal rectum was normal  Sigmoid colon and descending colon. Left colonic diverticulosis. From the proximal descending colon, a 2 mm sessile polyp was removed with the cold biopsy forceps  Splenic flexure. Normal  Transverse colon. Normal  Hepatic flexure. Normal  Ascending colon. Normal  Cecum and ileocecal valve. Normal  Terminal ileum. Normal  Assessment: A diminutive polyp was removed from the proximal descending colon with the cold biopsy forceps. Otherwise normal colonoscopy.

## 2014-12-25 NOTE — Transfer of Care (Signed)
Immediate Anesthesia Transfer of Care Note  Patient: Terri Ortega  Procedure(s) Performed: Procedure(s): COLONOSCOPY WITH PROPOFOL (N/A)  Patient Location: PACU  Anesthesia Type:MAC  Level of Consciousness:  sedated, patient cooperative and responds to stimulation  Airway & Oxygen Therapy:Patient Spontanous Breathing and Patient connected to face mask oxgen  Post-op Assessment:  Report given to PACU RN and Post -op Vital signs reviewed and stable  Post vital signs:  Reviewed and stable  Last Vitals:  Filed Vitals:   12/25/14 1235  BP: 144/62  Pulse: 61  Temp: 36.1 C  Resp: 17    Complications: No apparent anesthesia complications

## 2014-12-26 ENCOUNTER — Encounter (HOSPITAL_COMMUNITY): Payer: Self-pay | Admitting: Gastroenterology

## 2015-01-11 ENCOUNTER — Ambulatory Visit: Payer: Medicare Other | Admitting: Cardiology

## 2015-02-01 ENCOUNTER — Ambulatory Visit (INDEPENDENT_AMBULATORY_CARE_PROVIDER_SITE_OTHER): Payer: Medicare Other | Admitting: Podiatry

## 2015-02-01 DIAGNOSIS — Q828 Other specified congenital malformations of skin: Secondary | ICD-10-CM

## 2015-02-01 NOTE — Progress Notes (Signed)
Patient ID: Terri Ortega, female   DOB: 1941-05-13, 74 y.o.   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  She returns for continued care of her lesion.Objective: Review of past medical history, medications, social history and allergies were performed.  Vascular: Dorsalis pedis and posterior tibial pulses were palpable B/L, capillary refill was  WNL B/L, temperature gradient was WNL B/L   Skin:  No signs of symptoms of infection or ulcers on both feet.  Porokeratosis left forefoot.  Nails: appear healthy with no signs of mycosis or infections  Sensory: Semmes Weinstein monifilament WNL   Orthopedic: Orthopedic evaluation demonstrates all joints distal t ankle have full ROM without crepitus, muscle power WNL B/LPorokeratosis left foot.  P.  Debridement of porokeratosis

## 2015-03-25 ENCOUNTER — Other Ambulatory Visit: Payer: Self-pay

## 2015-03-25 DIAGNOSIS — Z1231 Encounter for screening mammogram for malignant neoplasm of breast: Secondary | ICD-10-CM

## 2015-04-03 ENCOUNTER — Ambulatory Visit
Admission: RE | Admit: 2015-04-03 | Discharge: 2015-04-03 | Disposition: A | Discharge status: Home / Self Care | Payer: Medicare Other | Source: Ambulatory Visit

## 2015-04-03 DIAGNOSIS — Z1231 Encounter for screening mammogram for malignant neoplasm of breast: Secondary | ICD-10-CM

## 2015-04-05 ENCOUNTER — Encounter: Payer: Self-pay | Admitting: Gynecologic Oncology

## 2015-04-05 ENCOUNTER — Ambulatory Visit: Payer: Medicare Other | Attending: Gynecologic Oncology | Admitting: Gynecologic Oncology

## 2015-04-05 VITALS — BP 141/80 | HR 67 | Temp 97.8°F | Resp 18 | Ht 64.0 in | Wt 173.6 lb

## 2015-04-05 DIAGNOSIS — C541 Malignant neoplasm of endometrium: Secondary | ICD-10-CM | POA: Insufficient documentation

## 2015-04-05 NOTE — Progress Notes (Signed)
GYN ONC FOLLOWUP NOTE  Assessment:    74 y.o. 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   S/p robotic hysterectomy, BSO, lymphadenectomy on 01/30/14 in [place]. No LVSI, 65% myometrial invasion, negative pelvic washings and negative lymph nodes. S/p adjuvant vaginal brachytherapy in November 2015. No evidence of recurrence on surveillance examination.   Plan: 1) Disease counseling - We reviewed symptoms concerning for recurrence (including vaginal bleeding, pelvic or abdominal pains, new lower extremity edema, cough, change in bladder or bowel habit, or weight loss) and the patient was informed to contact us if she notes these symptoms in between her scheduled surveillance visits.  3)  Return to clinic in 3 months to see Dr Dwana Curd and in 6 months to see me.   HPI:  Terri Ortega is a 74 y.o. year old woman initially seen in consultation in June, 2015 for grade 1 endometrial cancer.  She then underwent a robotic assisted total laparoscopic hysterectomy, bilateral salpingo-oophorectomy, pelvic and para-aortic lymphadenectomy (with SLN mapping) on 5/45/62 without complications.  Her postoperative course was uncomplicated.  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 She was dispositioned to receive adjuvant vaginal break E therapy which she completed in November of 2015.  Interval Hx:  She is seen today for a followup visit. She denies symptoms concerning for recurrence (including vaginal bleeding, pelvic or abdominal pains, new lower extremity edema, cough, change in bladder or bowel habit, or weight loss). She is sexually active and denies difficulty or discomfort with this. She is continuing to use her vaginal dilator.  She last saw Dr Sondra Come in April, 2016 with a normal pap at that time, and had a colonoscopy in May, 2016 with  benign polyps removed.   Review of systems: Constitutional:  She has no weight gain or weight loss. She has no fever or chills. Eyes: No blurred vision Ears, Nose, Mouth, Throat: No dizziness, headaches or changes in hearing. No mouth sores. Cardiovascular: No chest pain, palpitations or edema. Respiratory:  No shortness of breath, wheezing or cough Gastrointestinal: She has normal bowel movements without diarrhea or constipation. She denies any nausea or vomiting. She denies blood in her stool or heart burn. Genitourinary:  She denies pelvic pain, pelvic pressure or changes in her urinary function. She has no hematuria, dysuria, or incontinence. She has no irregular vaginal bleeding or vaginal discharge Musculoskeletal: Denies muscle weakness or joint pains.  Skin:  She has no skin changes, rashes or itching Neurological:  Denies dizziness or headaches. No neuropathy, no numbness or tingling. Psychiatric:  She denies depression or anxiety. Hematologic/Lymphatic:   No easy bruising or bleeding  No Known Allergies   Current Outpatient Prescriptions on File Prior to Visit  Medication Sig Dispense Refill  . aspirin EC 81 MG tablet Take 81 mg by mouth at bedtime.     . Cholecalciferol (VITAMIN D3) 2000 UNITS capsule Take 2,000 Units by mouth every morning.     . furosemide (LASIX) 20 MG tablet Take 20 mg by mouth every morning.     Marland Kitchen levothyroxine (SYNTHROID) 112 MCG tablet Take 112 mcg by mouth daily before breakfast.     . naproxen sodium (ANAPROX) 220 MG tablet Take 440 mg by mouth 2 (two) times daily as needed (pain.).    Marland Kitchen Omega-3 1000 MG CAPS Take 2,000 capsules by mouth every morning.     Marland Kitchen  potassium chloride (K-DUR) 10 MEQ tablet Take 10 mEq by mouth every other day.     . raloxifene (EVISTA) 60 MG tablet Take 60 mg by mouth every morning.     . Ranitidine HCl (ZANTAC PO) Take 1 tablet by mouth every morning.     No current facility-administered medications on file prior to visit.      Past Medical History  Diagnosis Date  . HTN (hypertension)   . Hypothyroid   . Graves disease   . Osteoporosis   . Scoliosis   . Radiation 03/20/14, 03/27/14, 04/05/14, 04/12/14, 04/17/14    bracytherapy to proximal vagina 30 gray  . CHF (congestive heart failure)   . Shortness of breath dyspnea     x1 episode -visit [facility] -stress test 12-07-14 negative  . Uterine cancer     surgery and radiation x 5 treatments-last 9'15   Past Surgical History  Procedure Laterality Date  . Total knee arthroplasty      right  . Bunionectomy      left  . Robotic assisted total hysterectomy with bilateral salpingo oopherectomy  01/30/14    with lymph node biopsy  . Cataract extraction, bilateral Bilateral     last done 11-21-14  . Joint replacement      RTKA  . Colonoscopy with propofol N/A 12/25/2014    Procedure: COLONOSCOPY WITH PROPOFOL;  Surgeon: Garlan Fair, MD;  Location: [facility];  Service: Endoscopy;  Laterality: N/A;   Social History   Social History  . Marital Status: Married    Spouse Name: N/A  . Number of Children: 3  . Years of Education: N/A   Occupational History  . administrative assitant    Social History Main Topics  . Smoking status: Never Smoker   . Smokeless tobacco: Not on file  . Alcohol Use: No  . Drug Use: No  . Sexual Activity: Not Currently   Other Topics Concern  . Not on file   Social History Narrative   Family History  Problem Relation Age of Onset  . Heart disease Mother     Oncology History   Stage IB grade 1 endometrioid endometrial cancer treated with robotic staging on 01/30/14. Patient met high/intermed risk factors based on outer half (65%) myometrial invasion in a woman older than 70. Dispositioned to receive adjuvant vaginal brachytherapy to reduce the risk for recurrence at the vaginal cuff.     Endometrial cancer   12/29/2013 Initial Diagnosis Endometrial cancer. Grade 1. Hysteroscopy D&C   01/30/2014 Surgery robotic hyst, bso,  pelvic and PA node dissection at [facility] with Dr Nancy Marus     Physical Exam: Blood pressure 141/80, pulse 67, temperature 97.8 F (36.6 C), temperature source Oral, resp. rate 18, height 5' 4" (1.626 m), weight 173 lb 9.6 oz (78.744 kg), SpO2 98 %. General: Well dressed, well nourished in no apparent distress.   HEENT:  Normocephalic and atraumatic, no lesions.  Extraocular muscles intact. Sclerae anicteric. Pupils equal, round, reactive. No mouth sores or ulcers. Thyroid is normal size, not nodular, midline. Skin:  No lesions or rashes. Breasts:  deferred Lungs:  deferred Cardiovascular:  deferred Abdomen:  Soft, nontender, nondistended.  No palpable masses.  No hepatosplenomegaly.  No ascites. Normal bowel sounds.  No hernias.  Incisions are completely healed. Genitourinary: Normal EGBUS  Vaginal cuff intact.  No bleeding or discharge.  No cul de sac fullness. Mild radiation changes to mucosa. Extremities: No cyanosis, clubbing or edema.  No calf  tenderness or erythema. No palpable cords. Psychiatric: Mood and affect are appropriate. Neurological: Awake, alert and oriented x 3. Sensation is intact, no neuropathy.  Musculoskeletal: No pain, normal strength and range of motion.  Donaciano Eva, MD

## 2015-04-05 NOTE — Patient Instructions (Addendum)
Followup with Dr. Sondra Come in December and Dr. Denman George in March 2017. Please call our office in January to schedule this appointment or sooner with any additional questions or concerns.

## 2015-05-09 ENCOUNTER — Ambulatory Visit: Payer: Medicare Other | Admitting: Radiation Oncology

## 2015-05-16 ENCOUNTER — Telehealth: Payer: Self-pay | Admitting: Oncology

## 2015-05-16 NOTE — Telephone Encounter (Signed)
[name] left a message asking when her next follow up with Dr. Sondra Come will be.  Called her back and notified her that her next appointment is for 07/25/15 at 10 am.

## 2015-05-23 ENCOUNTER — Ambulatory Visit: Payer: Medicare Other | Admitting: Radiation Oncology

## 2015-07-25 ENCOUNTER — Ambulatory Visit: Payer: Medicare Other | Admitting: Radiation Oncology

## 2015-08-21 ENCOUNTER — Ambulatory Visit
Admission: RE | Admit: 2015-08-21 | Discharge: 2015-08-21 | Disposition: A | Discharge status: Home / Self Care | Payer: Medicare Other | Source: Ambulatory Visit | Attending: Radiation Oncology | Admitting: Radiation Oncology

## 2015-08-21 ENCOUNTER — Ambulatory Visit: Payer: Medicare Other | Admitting: Radiation Oncology

## 2015-08-21 ENCOUNTER — Encounter: Payer: Self-pay | Admitting: Radiation Oncology

## 2015-08-21 VITALS — BP 137/60 | HR 73 | Temp 99.0°F | Resp 16 | Ht 64.0 in | Wt 180.0 lb

## 2015-08-21 DIAGNOSIS — C541 Malignant neoplasm of endometrium: Secondary | ICD-10-CM

## 2015-08-21 NOTE — Progress Notes (Addendum)
Terri Ortega here for follow up.  She continues to have pain in her right leg.  She denies having bladder issues other than urinating frequently from lasix.  She reports having occasional urgency with bowel movements.  She denies having any vaginal/rectal bleeding or discharge.  She reports having fatigue.  She reports occasionally using her vaginal dilator.  BP 137/60 mmHg  Pulse 73  Temp(Src) 99 F (37.2 C) (Oral)  Resp 16  Ht 5\' 4"  (1.626 m)  Wt 180 lb (81.647 kg)  BMI 30.88 kg/m2   Wt Readings from Last 3 Encounters:  08/21/15 180 lb (81.647 kg)  04/05/15 173 lb 9.6 oz (78.744 kg)  12/25/14 168 lb (76.204 kg)

## 2015-08-21 NOTE — Progress Notes (Signed)
Radiation Oncology         [phone] ________________________________  Name: Terri Ortega MRN: [id number]  Date: 08/21/2015  DOB: 1940-11-03  Follow-Up Visit Note  CC: Kandice Hams, MD  Nancy Marus, MD    ICD-9-CM ICD-10-CM   1. Endometrial cancer (HCC) 182.0 C54.1     Diagnosis:  Stage IB, grade 1 endometrioid adenocarcinoma   Oncology History   Stage IB grade 1 endometrioid endometrial cancer treated with robotic staging on 01/30/14. Patient met high/intermed risk factors based on outer half (65%) myometrial invasion in a woman older than 70. Dispositioned to receive adjuvant vaginal brachytherapy to reduce the risk for recurrence at the vaginal cuff.     Endometrial cancer ([place])   12/29/2013 Initial Diagnosis Endometrial cancer. Grade 1. Hysteroscopy D&C   01/30/2014 Surgery robotic hyst, bso, pelvic and PA node dissection at [facility] with Dr Nancy Marus      Interval Since Last Radiation:  One year and 4 months  Narrative:  The patient returns today for routine follow-up.  She is doing well and without complaints. She has noticed some bowel urgency since completion of her vaginal brachytherapy but no other issues. He continues to use her vaginal dilator although infrequently. She is sexually active and denies any pain or vaginal bleeding after intercourse. She denies any diarrhea or rectal bleeding. She denies any hematuria.                              ALLERGIES:  has No Known Allergies.  Meds: Current Outpatient Prescriptions  Medication Sig Dispense Refill  . aspirin EC 81 MG tablet Take 81 mg by mouth at bedtime.     . Cholecalciferol (VITAMIN D3) 2000 UNITS capsule Take 2,000 Units by mouth every morning.     . furosemide (LASIX) 20 MG tablet Take 20 mg by mouth every morning.     Marland Kitchen levothyroxine (SYNTHROID) 112 MCG tablet Take 112 mcg by mouth daily before breakfast.     . naproxen sodium (ANAPROX) 220 MG tablet Take 440 mg by mouth 2 (two) times  daily as needed (pain.).    Marland Kitchen Omega-3 1000 MG CAPS Take 2,000 capsules by mouth every morning.     . potassium chloride (K-DUR) 10 MEQ tablet Take 10 mEq by mouth every other day.     . raloxifene (EVISTA) 60 MG tablet Take 60 mg by mouth every morning.     . Ranitidine HCl (ZANTAC PO) Take 1 tablet by mouth every morning.     No current facility-administered medications for this encounter.    Physical Findings: The patient is in no acute distress. Patient is alert and oriented.  height is '5\' 4"'  (1.626 m) and weight is 180 lb (81.647 kg). Her oral temperature is 99 F (37.2 C). Her blood pressure is 137/60 and her pulse is 73. Her respiration is 16. Marland Kitchen  No palpable subclavicular or axillary adenopathy. The lungs are clear to auscultation. The heart has a regular rhythm and rate. The abdomen is soft and nontender with normal bowel sounds. No inguinal adenopathy is appreciated. On pelvic examination the external genitalia are unremarkable. Speculum exam is performed. No mucosal lesions are noted in the vaginal vault. On bimanual and rectovaginal examination there no pelvic masses appreciated  Lab Findings: Lab Results  Component Value Date   WBC 8.1 10/31/2014   HGB 12.0 10/31/2014   HCT 36.4 10/31/2014   MCV 92.6  10/31/2014   PLT 183 10/31/2014    Radiographic Findings: No results found.  Impression:  No evidence of recurrence on clinical exam today  Plan:  Routine follow-up in 6 months. In the interim the patient will be seen by Dr. Denman George  ____________________________________ Gery Pray, MD

## 2015-09-19 NOTE — Progress Notes (Signed)
HPI The patient presents for evaluation of a mildly reduced ejection fraction. I had seen her in the past for evaluation of dyspnea. She was hospitalized in March of last year and I reviewed these hospital records. She had some pain in her upper back. She had some shortness of breath. The etiology of this was not clear. She was discharged before she had the results of an echocardiogram. I have reviewed this and it demonstrated that her ejection fraction which had been 60-65% previously was thought to be globally reduced at 45%.  However follow-up stress perfusion study demonstrated no ischemia in the EF was said to be 65%. She returns for follow-up. Since then she has been somewhat limited because of back discomfort. However, she still does some exercising at the [facility]. The patient denies any new symptoms such as chest discomfort, neck or arm discomfort. There has been no new shortness of breath, PND or orthopnea. There have been no reported palpitations, presyncope or syncope.  No Known Allergies  Current Outpatient Prescriptions  Medication Sig Dispense Refill  . aspirin EC 81 MG tablet Take 81 mg by mouth at bedtime.     . Cholecalciferol (VITAMIN D3) 2000 UNITS capsule Take 2,000 Units by mouth every morning.     . furosemide (LASIX) 20 MG tablet Take 20 mg by mouth every morning.     Marland Kitchen levothyroxine (SYNTHROID) 112 MCG tablet Take 112 mcg by mouth daily before breakfast.     . Omega-3 1000 MG CAPS Take 2,000 capsules by mouth every morning.     . potassium chloride (K-DUR) 10 MEQ tablet Take 10 mEq by mouth every other day.     . raloxifene (EVISTA) 60 MG tablet Take 60 mg by mouth every morning.     . Ranitidine HCl (ZANTAC PO) Take 1 tablet by mouth every morning.     No current facility-administered medications for this visit.    Past Medical History  Diagnosis Date  . HTN (hypertension)   . Hypothyroid   . Graves disease   . Osteoporosis   . Scoliosis   . Radiation 03/20/14,  03/27/14, 04/05/14, 04/12/14, 04/17/14    bracytherapy to proximal vagina 30 gray  . CHF (congestive heart failure) ([place])   . Shortness of breath dyspnea     x1 episode -visit ERD -stress test 12-07-14 negative  . Uterine cancer ([place])     surgery and radiation x 5 treatments-last 9'15    Past Surgical History  Procedure Laterality Date  . Total knee arthroplasty      right  . Bunionectomy      left  . Robotic assisted total hysterectomy with bilateral salpingo oopherectomy  01/30/14    with lymph node biopsy  . Cataract extraction, bilateral Bilateral     last done 11-21-14  . Joint replacement      RTKA  . Colonoscopy with propofol N/A 12/25/2014    Procedure: COLONOSCOPY WITH PROPOFOL;  Surgeon: Garlan Fair, MD;  Location: [facility];  Service: Endoscopy;  Laterality: N/A;    ROS:    Otherwise, as stated in the HPI and negative for all other systems.  PHYSICAL EXAM BP 120/72 mmHg  Pulse 66  Ht 5\' 4"  (1.626 m)  Wt 180 lb 3 oz (81.733 kg)  BMI 30.91 kg/m2 GENERAL:  Well appearing HEENT:  Pupils equal round and reactive, fundi not visualized, oral mucosa unremarkable NECK:  No jugular venous distention, waveform within normal limits, carotid upstroke brisk and symmetric, no  bruits, no thyromegaly LYMPHATICS:  No cervical, inguinal adenopathy LUNGS:  Clear to auscultation bilaterally BACK:  No CVA tenderness CHEST:  Unremarkable HEART:  PMI not displaced or sustained,S1 and S2 within normal limits, no S3, no S4, no clicks, no rubs, soft apical systolic murmur, no diastolic murmurs ABD:  Flat, positive bowel sounds normal in frequency in pitch, no bruits, no rebound, no guarding, no midline pulsatile mass, no hepatomegaly, no splenomegaly EXT:  2 plus pulses throughout, no edema, no cyanosis no clubbing SKIN:  No rashes no nodules NEURO:  Cranial nerves II through XII grossly intact, motor grossly intact throughout PSYCH:  Cognitively intact, oriented to person place and  time  EKG:  Normal sinus rhythm, rate 66, left axis deviation, left anterior fascicular block, poor anterior R wave progression, premature ectopic complexes, inferolateralT wave inversions slightly more pronounced than previous. 09-29-2015  ASSESSMENT AND PLAN  CARDIOMYOPATHY:  She had a mildly reduced ejection fraction on echo last year with a slightly abnormal EKG.  She had a negative perfusion study with normal EF however.  I am going to repeat an echocardiogram.   She has no symptoms suggestive of ischemia.  PALPITATIONS:  She is no longer bothered by this.  No change in therapy is indicated.  HTN:  The blood pressure is at target. No change in medications is indicated. We will continue with therapeutic lifestyle changes (TLC).  AORTIC VALVE DISEASE:  This is mild on the recent echo.  This will be followed with the echo as above.    Coronary artery was a follow-up

## 2015-09-20 ENCOUNTER — Encounter: Payer: Self-pay | Admitting: Cardiology

## 2015-09-20 ENCOUNTER — Ambulatory Visit (INDEPENDENT_AMBULATORY_CARE_PROVIDER_SITE_OTHER): Payer: Medicare Other | Admitting: Cardiology

## 2015-09-20 VITALS — BP 120/72 | HR 66 | Ht 64.0 in | Wt 180.2 lb

## 2015-09-20 DIAGNOSIS — I429 Cardiomyopathy, unspecified: Secondary | ICD-10-CM | POA: Diagnosis not present

## 2015-09-20 NOTE — Patient Instructions (Signed)
Medication Instructions:   NO CHANGE  Testing/Procedures:  Your physician has requested that you have an echocardiogram. Echocardiography is a painless test that uses sound waves to create images of your heart. It provides your doctor with information about the size and shape of your heart and how well your heart's chambers and valves are working. This procedure takes approximately one hour. There are no restrictions for this procedure.    Follow-Up:  Your physician wants you to follow-up in: [place] will receive a reminder letter in the mail two months in advance. If you don't receive a letter, please call our office to schedule the follow-up appointment.   If you need a refill on your cardiac medications before your next appointment, please call your pharmacy.

## 2015-10-02 ENCOUNTER — Encounter: Payer: Self-pay | Admitting: Podiatry

## 2015-10-02 ENCOUNTER — Ambulatory Visit (INDEPENDENT_AMBULATORY_CARE_PROVIDER_SITE_OTHER): Payer: Medicare Other | Admitting: Podiatry

## 2015-10-02 DIAGNOSIS — Q828 Other specified congenital malformations of skin: Secondary | ICD-10-CM | POA: Diagnosis not present

## 2015-10-02 NOTE — Progress Notes (Signed)
Patient ID: GAL CHMIEL, female   DOB: [birth year], 75 y.o.   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  She returns for continued care of her lesion.Objective: Review of past medical history, medications, social history and allergies were performed.  Vascular: Dorsalis pedis and posterior tibial pulses were palpable B/L, capillary refill was  WNL B/L, temperature gradient was WNL B/L   Skin:  No signs of symptoms of infection or ulcers on both feet.  Porokeratosis left forefoot.  Nails: appear healthy with no signs of mycosis or infections  Sensory: Semmes Weinstein monifilament WNL   Orthopedic: Orthopedic evaluation demonstrates all joints distal t ankle have full ROM without crepitus, muscle power WNL B/LPorokeratosis left foot.  P.  Debridement of porokeratosis.  RTC prn   Gardiner Barefoot DPM

## 2015-10-24 ENCOUNTER — Ambulatory Visit (HOSPITAL_COMMUNITY): Payer: Medicare Other | Attending: Cardiology

## 2015-10-24 ENCOUNTER — Other Ambulatory Visit: Payer: Self-pay

## 2015-10-24 DIAGNOSIS — I071 Rheumatic tricuspid insufficiency: Secondary | ICD-10-CM | POA: Diagnosis not present

## 2015-10-24 DIAGNOSIS — Z8249 Family history of ischemic heart disease and other diseases of the circulatory system: Secondary | ICD-10-CM | POA: Diagnosis not present

## 2015-10-24 DIAGNOSIS — I110 Hypertensive heart disease with heart failure: Secondary | ICD-10-CM | POA: Diagnosis not present

## 2015-10-24 DIAGNOSIS — E0500 Thyrotoxicosis with diffuse goiter without thyrotoxic crisis or storm: Secondary | ICD-10-CM | POA: Diagnosis not present

## 2015-10-24 DIAGNOSIS — I509 Heart failure, unspecified: Secondary | ICD-10-CM | POA: Diagnosis not present

## 2015-10-24 DIAGNOSIS — I351 Nonrheumatic aortic (valve) insufficiency: Secondary | ICD-10-CM | POA: Insufficient documentation

## 2015-10-24 DIAGNOSIS — I429 Cardiomyopathy, unspecified: Secondary | ICD-10-CM | POA: Insufficient documentation

## 2015-11-22 ENCOUNTER — Encounter: Payer: Self-pay | Admitting: Gynecologic Oncology

## 2015-11-22 ENCOUNTER — Ambulatory Visit: Payer: Medicare Other | Attending: Gynecologic Oncology | Admitting: Gynecologic Oncology

## 2015-11-22 VITALS — BP 147/61 | HR 71 | Temp 98.5°F | Resp 18 | Wt 183.6 lb

## 2015-11-22 DIAGNOSIS — R0602 Shortness of breath: Secondary | ICD-10-CM | POA: Insufficient documentation

## 2015-11-22 DIAGNOSIS — Z9889 Other specified postprocedural states: Secondary | ICD-10-CM | POA: Diagnosis not present

## 2015-11-22 DIAGNOSIS — I509 Heart failure, unspecified: Secondary | ICD-10-CM | POA: Diagnosis not present

## 2015-11-22 DIAGNOSIS — E039 Hypothyroidism, unspecified: Secondary | ICD-10-CM | POA: Diagnosis not present

## 2015-11-22 DIAGNOSIS — Z7982 Long term (current) use of aspirin: Secondary | ICD-10-CM | POA: Insufficient documentation

## 2015-11-22 DIAGNOSIS — Z90710 Acquired absence of both cervix and uterus: Secondary | ICD-10-CM | POA: Diagnosis not present

## 2015-11-22 DIAGNOSIS — M419 Scoliosis, unspecified: Secondary | ICD-10-CM | POA: Diagnosis not present

## 2015-11-22 DIAGNOSIS — I10 Essential (primary) hypertension: Secondary | ICD-10-CM | POA: Diagnosis not present

## 2015-11-22 DIAGNOSIS — Z96659 Presence of unspecified artificial knee joint: Secondary | ICD-10-CM | POA: Insufficient documentation

## 2015-11-22 DIAGNOSIS — C541 Malignant neoplasm of endometrium: Secondary | ICD-10-CM

## 2015-11-22 DIAGNOSIS — Z8542 Personal history of malignant neoplasm of other parts of uterus: Secondary | ICD-10-CM | POA: Diagnosis not present

## 2015-11-22 DIAGNOSIS — Z79899 Other long term (current) drug therapy: Secondary | ICD-10-CM | POA: Diagnosis not present

## 2015-11-22 DIAGNOSIS — E0500 Thyrotoxicosis with diffuse goiter without thyrotoxic crisis or storm: Secondary | ICD-10-CM | POA: Insufficient documentation

## 2015-11-22 NOTE — Patient Instructions (Signed)
Plan to follow up in six months or sooner if needed.  You will be seeing Dr. Sondra Come in July.  Please call for any questions or concerns.

## 2015-11-22 NOTE — Progress Notes (Signed)
GYN ONC FOLLOWUP NOTE  Assessment:    75 y.o. 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   S/p robotic hysterectomy, BSO, lymphadenectomy on 01/30/14 in [place]. No LVSI, 65% myometrial invasion, negative pelvic washings and negative lymph nodes. S/p adjuvant vaginal brachytherapy in November 2015. No evidence of recurrence on surveillance examination.  Plan: 1) Disease counseling - We reviewed symptoms concerning for recurrence (including vaginal bleeding, pelvic or abdominal pains, new lower extremity edema, cough, change in bladder or bowel habit, or weight loss) and the patient was informed to contact us if she notes these symptoms in between her scheduled surveillance visits.  2)  Return to clinic in 3 months to see Dr Dwana Curd and in 6 months to see me.   HPI:  Terri Ortega is a 75 y.o. year old woman initially seen in consultation in June, 2015 for grade 1 endometrial cancer.  She then underwent a robotic assisted total laparoscopic hysterectomy, bilateral salpingo-oophorectomy, pelvic and para-aortic lymphadenectomy (with SLN mapping) on 8/41/32 without complications.  Her postoperative course was uncomplicated.  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 She was dispositioned to receive adjuvant vaginal break E therapy which she completed in November of 2015.  Interval Hx:  She is seen today for a followup visit. She denies symptoms concerning for recurrence (including vaginal bleeding, pelvic or abdominal pains, new lower extremity edema, cough, change in bladder or bowel habit, or weight loss). She is sexually active and denies difficulty or discomfort with this. She is continuing to use her vaginal dilator.  She had a colonoscopy in May, 2016 with benign polyps removed.   Review of systems: Constitutional:  She has  no weight gain or weight loss. She has no fever or chills. Eyes: No blurred vision Ears, Nose, Mouth, Throat: + allergy symptoms bothersome and refractory to claritin Cardiovascular: No chest pain, palpitations or edema. Respiratory:  No shortness of breath, wheezing or cough Gastrointestinal: She has normal bowel movements without diarrhea or constipation. She denies any nausea or vomiting. She denies blood in her stool or heart burn. Genitourinary:  She denies pelvic pain, pelvic pressure or changes in her urinary function. She has no hematuria, dysuria, or incontinence. She has no irregular vaginal bleeding or vaginal discharge Musculoskeletal: Denies muscle weakness or joint pains.  Skin:  She has no skin changes, rashes or itching Neurological:  Denies dizziness or headaches. No neuropathy, no numbness or tingling. Psychiatric:  She denies depression or anxiety. Hematologic/Lymphatic:   No easy bruising or bleeding  No Known Allergies   Current Outpatient Prescriptions on File Prior to Visit  Medication Sig Dispense Refill  . aspirin EC 81 MG tablet Take 81 mg by mouth at bedtime.     . Cholecalciferol (VITAMIN D3) 2000 UNITS capsule Take 2,000 Units by mouth every morning.     . furosemide (LASIX) 20 MG tablet Take 20 mg by mouth every morning.     Marland Kitchen levothyroxine (SYNTHROID) 112 MCG tablet Take 112 mcg by mouth daily before breakfast.     . Omega-3 1000 MG CAPS Take 2,000 capsules by mouth every morning.     . potassium chloride (K-DUR) 10 MEQ tablet Take 10 mEq by mouth every other day.     . raloxifene (EVISTA) 60 MG tablet Take 60 mg by mouth every morning.     . Ranitidine  HCl (ZANTAC PO) Take 1 tablet by mouth every morning.     No current facility-administered medications on file prior to visit.     Past Medical History  Diagnosis Date  . HTN (hypertension)   . Hypothyroid   . Graves disease   . Osteoporosis   . Scoliosis   . Radiation 03/20/14, 03/27/14, 04/05/14,  04/12/14, 04/17/14    bracytherapy to proximal vagina 30 gray  . CHF (congestive heart failure) ([place])   . Shortness of breath dyspnea     x1 episode -visit ERD -stress test 12-07-14 negative  . Uterine cancer ([place])     surgery and radiation x 5 treatments-last 9'15   Past Surgical History  Procedure Laterality Date  . Total knee arthroplasty      right  . Bunionectomy      left  . Robotic assisted total hysterectomy with bilateral salpingo oopherectomy  01/30/14    with lymph node biopsy  . Cataract extraction, bilateral Bilateral     last done 11-21-14  . Joint replacement      RTKA  . Colonoscopy with propofol N/A 12/25/2014    Procedure: COLONOSCOPY WITH PROPOFOL;  Surgeon: Garlan Fair, MD;  Location: [facility];  Service: Endoscopy;  Laterality: N/A;   Social History   Social History  . Marital Status: Married    Spouse Name: N/A  . Number of Children: 3  . Years of Education: N/A   Occupational History  . administrative assitant    Social History Main Topics  . Smoking status: Never Smoker   . Smokeless tobacco: Not on file  . Alcohol Use: No  . Drug Use: No  . Sexual Activity: Not Currently   Other Topics Concern  . Not on file   Social History Narrative   Family History  Problem Relation Age of Onset  . Heart disease Mother     Oncology History   Stage IB grade 1 endometrioid endometrial cancer treated with robotic staging on 01/30/14. Patient met high/intermed risk factors based on outer half (65%) myometrial invasion in a woman older than 70. Dispositioned to receive adjuvant vaginal brachytherapy to reduce the risk for recurrence at the vaginal cuff.     Endometrial cancer ([place])   12/29/2013 Initial Diagnosis Endometrial cancer. Grade 1. Hysteroscopy D&C   01/30/2014 Surgery robotic hyst, bso, pelvic and PA node dissection at [facility] with Dr Nancy Marus     Physical Exam: Blood pressure 147/61, pulse 71, temperature 98.5 F (36.9 C),  temperature source Oral, resp. rate 18, weight 183 lb 9.6 oz (83.28 kg), SpO2 98 %. General: Well dressed, well nourished in no apparent distress.   HEENT:  Normocephalic and atraumatic, no lesions.  Extraocular muscles intact. Sclerae anicteric. Pupils equal, round, reactive. No mouth sores or ulcers. Thyroid is normal size, not nodular, midline. Skin:  No lesions or rashes. Breasts:  deferred Lungs:  deferred Cardiovascular:  deferred Abdomen:  Soft, nontender, nondistended.  No palpable masses.  No hepatosplenomegaly.  No ascites. Normal bowel sounds.  No hernias.  Incisions are completely healed. Genitourinary: Normal EGBUS  Vaginal cuff intact.  No bleeding or discharge.  No cul de sac fullness. Mild radiation changes to mucosa. Extremities: No cyanosis, clubbing or edema.  No calf tenderness or erythema. No palpable cords. Psychiatric: Mood and affect are appropriate. Neurological: Awake, alert and oriented x 3. Sensation is intact, no neuropathy.  Musculoskeletal: No pain, normal strength and range of motion.  Donaciano Eva, MD

## 2016-02-13 ENCOUNTER — Ambulatory Visit: Payer: Medicare Other | Admitting: Radiation Oncology

## 2016-02-20 ENCOUNTER — Ambulatory Visit: Payer: Medicare Other | Admitting: Radiation Oncology

## 2016-03-05 ENCOUNTER — Other Ambulatory Visit (HOSPITAL_COMMUNITY)
Admission: RE | Admit: 2016-03-05 | Discharge: 2016-03-05 | Disposition: A | Discharge status: Home / Self Care | Payer: Medicare Other | Source: Ambulatory Visit | Attending: Radiation Oncology | Admitting: Radiation Oncology

## 2016-03-05 ENCOUNTER — Encounter: Payer: Self-pay | Admitting: Radiation Oncology

## 2016-03-05 ENCOUNTER — Ambulatory Visit
Admission: RE | Admit: 2016-03-05 | Discharge: 2016-03-05 | Disposition: A | Discharge status: Home / Self Care | Payer: Medicare Other | Source: Ambulatory Visit | Attending: Radiation Oncology | Admitting: Radiation Oncology

## 2016-03-05 VITALS — BP 145/66 | HR 73 | Temp 98.0°F | Ht 64.0 in | Wt 178.8 lb

## 2016-03-05 DIAGNOSIS — Z7982 Long term (current) use of aspirin: Secondary | ICD-10-CM | POA: Insufficient documentation

## 2016-03-05 DIAGNOSIS — C541 Malignant neoplasm of endometrium: Secondary | ICD-10-CM | POA: Insufficient documentation

## 2016-03-05 DIAGNOSIS — Z124 Encounter for screening for malignant neoplasm of cervix: Secondary | ICD-10-CM | POA: Insufficient documentation

## 2016-03-05 DIAGNOSIS — Z79899 Other long term (current) drug therapy: Secondary | ICD-10-CM | POA: Diagnosis not present

## 2016-03-05 NOTE — Progress Notes (Signed)
Terri Ortega here for follow up.  She denies pain.  She does report having problems with her lower back that is affecting her gait.  She is very unsteady when walking.  She reports having urinary incontinence which she thinks is due to taking lasix.  She denies having bowel issues or vaginal/rectal bleeding.  She reports her energy level is low.  She reports she is using her vaginal dilator occasionally.  BP (!) 145/66 (BP Location: Right Arm, Patient Position: Sitting)   Pulse 73   Temp 98 F (36.7 C) (Oral)   Ht 5\' 4"  (1.626 m)   Wt 178 lb 12.8 oz (81.1 kg)   SpO2 99%   BMI 30.69 kg/m    Wt Readings from Last 3 Encounters:  03/05/16 178 lb 12.8 oz (81.1 kg)  11/22/15 183 lb 9.6 oz (83.3 kg)  09/20/15 180 lb 3 oz (81.7 kg)

## 2016-03-05 NOTE — Progress Notes (Signed)
Radiation Oncology         [phone] ________________________________  Name: Terri Ortega MRN: [id number]  Date: 03/05/2016  DOB: [birth year]  Follow-Up Visit Note  CC: Kandice Hams, MD  Nancy Marus, MD    ICD-9-CM ICD-10-CM   1. Endometrial cancer ([place]) 182.0 C54.1 Cytology - PAP    Diagnosis:  Stage IB, grade 1 endometrioid adenocarcinoma   Oncology History   Stage IB grade 1 endometrioid endometrial cancer treated with robotic staging on 01/30/14. Patient met high/intermed risk factors based on outer half (65%) myometrial invasion in a woman older than 70. Dispositioned to receive adjuvant vaginal brachytherapy to reduce the risk for recurrence at the vaginal cuff.     Endometrial cancer ([place])   12/29/2013 Initial Diagnosis    Endometrial cancer. Grade 1. Hysteroscopy D&C     01/30/2014 Surgery    robotic hyst, bso, pelvic and PA node dissection at [facility] with Dr Nancy Marus     Interval Since Last Radiation:  One year and 11 months  August 18, August 25, September 3, September 10, April 17, 2014: Proximal vagina, 30 gray in 5 fractions. The patient was treated with intracavitary brachytherapy treatments using iridium 192 as the high-dose-rate source, a 3 cm diameter cylinder was used to deliver the patient's treatment, a 4 cm treatment length was prescribed. 6 Pearline Cables was prescribed to the vaginal mucosal surface.  Narrative:  The patient returns today for routine follow-up. She saw Dr. Denman George on 11/22/15 who noted no evidence of recurrence on clinical exam. The patient's last Pap smear was on 11/08/14; negative for intraepithelial lesions or malignancy.  She denies pain.  She does report having problems with her lower back that is affecting her gait. She is very unsteady when walking. She denies falling. She saw Dr. Ronnald Ramp, neurosurgery, for this. She reports having urinary incontinence which she thinks is due to taking lasix.  She denies having bowel issues or  vaginal/rectal bleeding or discharge.  She reports her energy level is low.  She reports she is using her vaginal dilator occasionally.  ALLERGIES:  has No Known Allergies.  Meds: Current Outpatient Prescriptions  Medication Sig Dispense Refill  . aspirin EC 81 MG tablet Take 81 mg by mouth at bedtime.     . Cholecalciferol (VITAMIN D3) 2000 UNITS capsule Take 2,000 Units by mouth every morning.     . furosemide (LASIX) 20 MG tablet Take 20 mg by mouth every morning.     Marland Kitchen levothyroxine (SYNTHROID) 112 MCG tablet Take 112 mcg by mouth daily before breakfast.     . Omega-3 1000 MG CAPS Take 2,000 capsules by mouth every morning.     . potassium chloride (K-DUR) 10 MEQ tablet Take 10 mEq by mouth every other day.     . raloxifene (EVISTA) 60 MG tablet Take 60 mg by mouth every morning.     . Ranitidine HCl (ZANTAC PO) Take 1 tablet by mouth every morning.     No current facility-administered medications for this encounter.     Physical Findings: The patient is in no acute distress. Patient is alert and oriented.  height is _0  (1.626 m) and weight is 178 lb 12.8 oz (81.1 kg). Her oral temperature is 98 F (36.7 C). Her blood pressure is 145/66 (abnormal) and her pulse is 73. Her oxygen saturation is 99%. . No palpable subclavicular or axillary adenopathy. The lungs are clear to auscultation. The heart has a regular rhythm and rate.  The abdomen is soft and nontender with normal bowel sounds. No inguinal adenopathy is appreciated.  On pelvic examination the external genitalia were unremarkable. A speculum exam was performed. There are no mucosal lesions noted in the vaginal vault. A Pap smear was obtained of the proximal vagina. On bimanual and rectovaginal examination there were no pelvic masses appreciated.  Lab Findings: Lab Results  Component Value Date   WBC 8.1 10/31/2014   HGB 12.0 10/31/2014   HCT 36.4 10/31/2014   MCV 92.6 10/31/2014   PLT 183 10/31/2014    Radiographic  Findings: No results found.  Impression:  No evidence of recurrence on clinical exam today. Pap smear pending.  Plan: The patient is scheduled to see Dr. Denman George on 05/25/16 and the patient will follow up with radiation oncology 6 months from now.  ____________________________________ -----------------------------------  Blair Promise, PhD, MD  This document serves as a record of services personally performed by Gery Pray, MD. It was created on his behalf by Darcus Austin, a trained medical scribe. The creation of this record is based on the scribe's personal observations and the provider's statements to them. This document has been checked and approved by the attending provider.

## 2016-03-09 LAB — CYTOLOGY - PAP

## 2016-03-26 ENCOUNTER — Ambulatory Visit (INDEPENDENT_AMBULATORY_CARE_PROVIDER_SITE_OTHER): Payer: Medicare Other | Admitting: Podiatry

## 2016-03-26 ENCOUNTER — Encounter: Payer: Self-pay | Admitting: Podiatry

## 2016-03-26 VITALS — BP 153/74 | HR 70 | Resp 14

## 2016-03-26 DIAGNOSIS — M722 Plantar fascial fibromatosis: Secondary | ICD-10-CM

## 2016-03-26 NOTE — Progress Notes (Signed)
This patient presents the office to discuss the orthotics that she purchased through a. She says she has foot pain with ankle swelling as well as knee and hip problems. She was reading one of the magazines that she received from the doctor's office, which showed a pair of orthotics that can be worn. The magazines also stated all the benefits that she would receive from these orthotics. She says that she has worn the units orthotics and her back pain is severe and she is preparing to be evaluated by the orthopedic doctor. She presents questioning why the orthotics were ineffective   GENERAL APPEARANCE: Alert, conversant. Appropriately groomed. No acute distress.  VASCULAR: Pedal pulses are  palpable at  [facility] and PT bilateral.  Capillary refill time is immediate to all digits,  Normal temperature gradient.    NEUROLOGIC: sensation is normal to 5.07 monofilament at 5/5 sites bilateral.  Light touch is intact bilateral, Muscle strength normal.  MUSCULOSKELETAL: acceptable muscle strength, tone and stability bilateral.  Intrinsic muscluature intact bilateral.  Rectus appearance of foot and digits noted left foot.  HAV 1st MPJ  Right foot.  Arthritic midfoot left foot. History of pain through arch.  DERMATOLOGIC: skin color, texture, and turgor are within normal limits.  No preulcerative lesions or ulcers  are seen, no interdigital maceration noted.  No open lesions present.  Digital nails are asymptomatic. No drainage noted.  Plantar fascitis.  ROV.  Patient brought with her 2 pairs of orthotics for me to evaluate.   Th e bronze pair  I found to be very ineffective. The black pair does maintain its arch but is very thin. I was not pleased with either but I did tell her that the smaller black one would be beneficial in dress shoes.  I did tell her that she might benefit from power steps that we have in the office in the proceeded to show her the power steps and she is going to wear these in her shoes. Return to  the office when necessary   Gardiner Barefoot DPM   Plantar fascitis.

## 2016-04-14 NOTE — Addendum Note (Signed)
Encounter addended by: Karen R Hess, RN on: 04/14/2016  4:34 PM<BR>    Actions taken: Charge Capture section accepted

## 2016-05-25 ENCOUNTER — Encounter: Payer: Self-pay | Admitting: Gynecologic Oncology

## 2016-05-25 ENCOUNTER — Ambulatory Visit: Payer: Medicare Other | Attending: Gynecologic Oncology | Admitting: Gynecologic Oncology

## 2016-05-25 DIAGNOSIS — Z9841 Cataract extraction status, right eye: Secondary | ICD-10-CM | POA: Insufficient documentation

## 2016-05-25 DIAGNOSIS — I509 Heart failure, unspecified: Secondary | ICD-10-CM | POA: Insufficient documentation

## 2016-05-25 DIAGNOSIS — Z90710 Acquired absence of both cervix and uterus: Secondary | ICD-10-CM | POA: Insufficient documentation

## 2016-05-25 DIAGNOSIS — Z90722 Acquired absence of ovaries, bilateral: Secondary | ICD-10-CM | POA: Insufficient documentation

## 2016-05-25 DIAGNOSIS — E0500 Thyrotoxicosis with diffuse goiter without thyrotoxic crisis or storm: Secondary | ICD-10-CM | POA: Insufficient documentation

## 2016-05-25 DIAGNOSIS — I110 Hypertensive heart disease with heart failure: Secondary | ICD-10-CM | POA: Insufficient documentation

## 2016-05-25 DIAGNOSIS — Z8542 Personal history of malignant neoplasm of other parts of uterus: Secondary | ICD-10-CM

## 2016-05-25 DIAGNOSIS — E039 Hypothyroidism, unspecified: Secondary | ICD-10-CM | POA: Insufficient documentation

## 2016-05-25 DIAGNOSIS — Z96651 Presence of right artificial knee joint: Secondary | ICD-10-CM | POA: Diagnosis not present

## 2016-05-25 DIAGNOSIS — M419 Scoliosis, unspecified: Secondary | ICD-10-CM | POA: Insufficient documentation

## 2016-05-25 DIAGNOSIS — Z7982 Long term (current) use of aspirin: Secondary | ICD-10-CM | POA: Diagnosis not present

## 2016-05-25 DIAGNOSIS — Z8249 Family history of ischemic heart disease and other diseases of the circulatory system: Secondary | ICD-10-CM | POA: Insufficient documentation

## 2016-05-25 DIAGNOSIS — M810 Age-related osteoporosis without current pathological fracture: Secondary | ICD-10-CM | POA: Insufficient documentation

## 2016-05-25 DIAGNOSIS — C541 Malignant neoplasm of endometrium: Secondary | ICD-10-CM

## 2016-05-25 DIAGNOSIS — Z9842 Cataract extraction status, left eye: Secondary | ICD-10-CM | POA: Insufficient documentation

## 2016-05-25 NOTE — Progress Notes (Signed)
GYN ONC FOLLOWUP NOTE  Assessment:    75 y.o. 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   S/p robotic hysterectomy, BSO, lymphadenectomy on 01/30/14 in [place]. No LVSI, 65% myometrial invasion, negative pelvic washings and negative lymph nodes. S/p adjuvant vaginal brachytherapy in November 2015. No evidence of recurrence on surveillance examination.  Plan: 1) Disease counseling - We reviewed symptoms concerning for recurrence (including vaginal bleeding, pelvic or abdominal pains, new lower extremity edema, cough, change in bladder or bowel habit, or weight loss) and the patient was informed to contact us if she notes these symptoms in between her scheduled surveillance visits.  2)  Return to clinic in 3 months to see Dr Dwana Curd and in 9 months to see me (August 2018).   HPI:  Terri Ortega is a 75 y.o. year old woman initially seen in consultation in June, 2015 for grade 1 endometrial cancer.  She then underwent a robotic assisted total laparoscopic hysterectomy, bilateral salpingo-oophorectomy, pelvic and para-aortic lymphadenectomy (with SLN mapping) on 08/04/70 without complications.  Her postoperative course was uncomplicated.  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 She was dispositioned to receive adjuvant vaginal break E therapy which she completed in November of 2015.  Interval Hx:  She is seen today for a followup visit. She denies symptoms concerning for recurrence (including vaginal bleeding, pelvic or abdominal pains, new lower extremity edema, cough, change in bladder or bowel habit, or weight loss). She is sexually active and denies difficulty or discomfort with this. She is continuing to use her vaginal dilator.  She had a colonoscopy in May, 2016 with benign polyps removed.  She reports an episode of SOB,  back pain, chest discomfort and emesis last night. This has completely resolved but was unexplained.   Review of systems: Constitutional:  She has no weight gain or weight loss. She has no fever or chills. Eyes: No blurred vision Ears, Nose, Mouth, Throat: no nasal drip Cardiovascular: + chest and upper back pain and SOB Respiratory:  + shortness of breath, No wheezing or cough Gastrointestinal: She has normal bowel movements without diarrhea or constipation. She denies any nausea or vomiting. She denies blood in her stool or heart burn. Genitourinary:  She denies pelvic pain, pelvic pressure or changes in her urinary function. She has no hematuria, dysuria, or incontinence. She has no irregular vaginal bleeding or vaginal discharge Musculoskeletal: Denies muscle weakness or joint pains.  Skin:  She has no skin changes, rashes or itching Neurological:  Denies dizziness or headaches. No neuropathy, no numbness or tingling. Psychiatric:  She denies depression or anxiety. Hematologic/Lymphatic:   No easy bruising or bleeding  No Known Allergies   Current Outpatient Prescriptions on File Prior to Visit  Medication Sig Dispense Refill  . aspirin EC 81 MG tablet Take 81 mg by mouth at bedtime.     . Cholecalciferol (VITAMIN D3) 2000 UNITS capsule Take 2,000 Units by mouth every morning.     . furosemide (LASIX) 20 MG tablet Take 20 mg by mouth every morning.     Marland Kitchen levothyroxine (SYNTHROID) 112 MCG tablet Take 112 mcg by mouth daily before breakfast.     . Omega-3 1000 MG CAPS Take 2,000 capsules by mouth every morning.     . potassium chloride (K-DUR) 10 MEQ tablet Take 10 mEq by mouth every other day.     Marland Kitchen  raloxifene (EVISTA) 60 MG tablet Take 60 mg by mouth every morning.     . Ranitidine HCl (ZANTAC PO) Take 1 tablet by mouth every morning.     No current facility-administered medications on file prior to visit.      Past Medical History:  Diagnosis Date  . CHF (congestive heart  failure) ([place])   . Graves disease   . HTN (hypertension)   . Hypothyroid   . Osteoporosis   . Radiation 03/20/14, 03/27/14, 04/05/14, 04/12/14, 04/17/14   bracytherapy to proximal vagina 30 gray  . Scoliosis   . Shortness of breath dyspnea    x1 episode -visit ERD -stress test 12-07-14 negative  . Uterine cancer ([place])    surgery and radiation x 5 treatments-last 9'15   Past Surgical History:  Procedure Laterality Date  . BUNIONECTOMY     left  . CATARACT EXTRACTION, BILATERAL Bilateral    last done 11-21-14  . COLONOSCOPY WITH PROPOFOL N/A 12/25/2014   Procedure: COLONOSCOPY WITH PROPOFOL;  Surgeon: Garlan Fair, MD;  Location: [facility] END[facility]COPY;  Service: Endoscopy;  Laterality: N/A;  . JOINT REPLACEMENT     RTKA  . ROBOTIC ASSISTED TOTAL HYSTERECTOMY WITH BILATERAL SALPINGO OOPHERECTOMY  01/30/14   with lymph node biopsy  . TOTAL KNEE ARTHROPLASTY     right   Social History   Social History  . Marital status: Married    Spouse name: N/A  . Number of children: 3  . Years of education: N/A   Occupational History  . administrative assitant    Social History Main Topics  . Smoking status: Never Smoker  . Smokeless tobacco: Never Used  . Alcohol use No  . Drug use: No  . Sexual activity: Not Currently   Other Topics Concern  . Not on file   Social History Narrative  . No narrative on file   Family History  Problem Relation Age of Onset  . Heart disease Mother     Oncology History   Stage IB grade 1 endometrioid endometrial cancer treated with robotic staging on 01/30/14. Patient met high/intermed risk factors based on outer half (65%) myometrial invasion in a woman older than 70. Dispositioned to receive adjuvant vaginal brachytherapy to reduce the risk for recurrence at the vaginal cuff.     Endometrial cancer ([place])   12/29/2013 Initial Diagnosis    Endometrial cancer. Grade 1. Hysteroscopy D&C      01/30/2014 Surgery    robotic hyst, bso, pelvic and PA node  dissection at [facility] with Dr Nancy Marus        Physical Exam: There were no vitals taken for this visit. General: Well dressed, well nourished in no apparent distress.   HEENT:  Normocephalic and atraumatic, no lesions.  Extraocular muscles intact. Sclerae anicteric. Pupils equal, round, reactive. No mouth sores or ulcers. Thyroid is normal size, not nodular, midline. Skin:  No lesions or rashes. Breasts:  deferred Lungs:  deferred Cardiovascular:  deferred Abdomen:  Soft, nontender, nondistended.  No palpable masses.  No hepatosplenomegaly.  No ascites. Normal bowel sounds.  No hernias.  Incisions are completely healed. Genitourinary: Normal EGBUS  Vaginal cuff intact.  No bleeding or discharge.  No cul de sac fullness. Mild radiation changes to mucosa. Extremities: No cyanosis, clubbing or edema.  No calf tenderness or erythema. No palpable cords. Psychiatric: Mood and affect are appropriate. Neurological: Awake, alert and oriented x 3. Sensation is intact, no neuropathy.  Musculoskeletal: No pain, normal strength and  range of motion.  Donaciano Eva, MD

## 2016-05-25 NOTE — Patient Instructions (Signed)
Plan to follow up in August 2018 or sooner if needed.  Please seek medical care for your current symptoms.  Please call our office for any questions or concerns.

## 2016-06-05 ENCOUNTER — Telehealth: Payer: Self-pay | Admitting: Cardiology

## 2016-06-05 NOTE — Telephone Encounter (Signed)
Closed encounter °

## 2016-06-16 ENCOUNTER — Other Ambulatory Visit: Payer: Self-pay | Admitting: Internal Medicine

## 2016-06-16 DIAGNOSIS — Z1231 Encounter for screening mammogram for malignant neoplasm of breast: Secondary | ICD-10-CM

## 2016-06-28 NOTE — Progress Notes (Signed)
HPI The patient presents for evaluation of a mildly reduced ejection fraction. I had seen her in the past for evaluation of dyspnea. She was hospitalized in March of last year with back pain and SOB.  Echocardiogram demonstrated that her ejection fraction which had been 60-65% previously was thought to be globally reduced at 45%.  However follow-up stress perfusion study demonstrated no ischemia in the EF was said to be 65%.  I repeated an echo when I saw her in March.  It was essentially normal.   She returns for follow up.  She did have an episode about 6-8 weeks ago of chest burning. This became back pain and eventually she had nausea and vomiting. This is similar to what she had in March of last year when she had her negative stress perfusion study. Since then she's had no other symptoms. She's been exercising 5 days a week going to the [facility]. She's lost about 14 pounds. The patient denies any new symptoms such as chest discomfort, neck or arm discomfort. There has been no new shortness of breath, PND or orthopnea. There have been no reported palpitations, presyncope or syncope.  No Known Allergies  Current Outpatient Prescriptions  Medication Sig Dispense Refill  . aspirin EC 81 MG tablet Take 81 mg by mouth at bedtime.     . Cholecalciferol (VITAMIN D3) 2000 UNITS capsule Take 2,000 Units by mouth every morning.     . furosemide (LASIX) 20 MG tablet Take 20 mg by mouth every morning.     Marland Kitchen levothyroxine (SYNTHROID) 112 MCG tablet Take 112 mcg by mouth daily before breakfast.     . Omega-3 1000 MG CAPS Take 2,000 capsules by mouth every morning.     . potassium chloride (K-DUR) 10 MEQ tablet Take 10 mEq by mouth every other day.     . raloxifene (EVISTA) 60 MG tablet Take 60 mg by mouth every morning.     . Ranitidine HCl (ZANTAC PO) Take 1 tablet by mouth every morning.     No current facility-administered medications for this visit.     Past Medical History:  Diagnosis Date  . CHF  (congestive heart failure) ([place])   . Graves disease   . HTN (hypertension)   . Hypothyroid   . Osteoporosis   . Radiation 03/20/14, 03/27/14, 04/05/14, 04/12/14, 04/17/14   bracytherapy to proximal vagina 30 gray  . Scoliosis   . Shortness of breath dyspnea    x1 episode -visit ERD -stress test 12-07-14 negative  . Uterine cancer ([place])    surgery and radiation x 5 treatments-last 9'15    Past Surgical History:  Procedure Laterality Date  . BUNIONECTOMY     left  . CATARACT EXTRACTION, BILATERAL Bilateral    last done 11-21-14  . COLONOSCOPY WITH PROPOFOL N/A 12/25/2014   Procedure: COLONOSCOPY WITH PROPOFOL;  Surgeon: Garlan Fair, MD;  Location: [facility];  Service: Endoscopy;  Laterality: N/A;  . JOINT REPLACEMENT     RTKA  . ROBOTIC ASSISTED TOTAL HYSTERECTOMY WITH BILATERAL SALPINGO OOPHERECTOMY  01/30/14   with lymph node biopsy  . TOTAL KNEE ARTHROPLASTY     right    ROS:     Otherwise, as stated in the HPI and negative for all other systems.  PHYSICAL EXAM BP (!) 142/70   Pulse 62   Ht 5\' 4"  (1.626 m)   Wt 165 lb 6.4 oz (75 kg)   BMI 28.39 kg/m  GENERAL:  Well appearing NECK:  No jugular venous distention, waveform within normal limits, carotid upstroke brisk and symmetric, no bruits, no thyromegaly LUNGS:  Clear to auscultation bilaterally BACK:  No CVA tenderness CHEST:  Unremarkable HEART:  PMI not displaced or sustained,S1 and S2 within normal limits, no S3, no S4, no clicks, no rubs, soft apical systolic murmur, no diastolic murmurs ABD:  Flat, positive bowel sounds normal in frequency in pitch, no bruits, no rebound, no guarding, no midline pulsatile mass, no hepatomegaly, no splenomegaly EXT:  2 plus pulses throughout, no edema, no cyanosis no clubbing  ASSESSMENT AND PLAN  CARDIOMYOPATHY:  her ejection fraction is normal. No further cardiac workup is suggested.   PALPITATIONS:  She is no longer bothered by this.  No change in therapy is  indicated.  HTN:  The blood pressure is Mildly elevated. No change in medicShe had one episode of this. Marland Kitchenations is indicated. We will continue with therapeutic lifestyle changes (TLC).  AORTIC VALVE DISEASE:  This is mild on the recent echo.  This will be followed with the echo as above.      CHEST PAIN:  We discussed this at length. This was similar to the symptoms she had last year when she had a negative cardiac workup. This may well be related to something GI. She was told she had GERD in the past. She's had some shortness of breath and wheezing that could be reflux. I've encouraged her to see a gastroenterologist

## 2016-06-29 ENCOUNTER — Ambulatory Visit (INDEPENDENT_AMBULATORY_CARE_PROVIDER_SITE_OTHER): Payer: Medicare Other | Admitting: Cardiology

## 2016-06-29 ENCOUNTER — Encounter: Payer: Self-pay | Admitting: Cardiology

## 2016-06-29 VITALS — BP 142/70 | HR 62 | Ht 64.0 in | Wt 165.4 lb

## 2016-06-29 DIAGNOSIS — R079 Chest pain, unspecified: Secondary | ICD-10-CM

## 2016-06-29 DIAGNOSIS — R05 Cough: Secondary | ICD-10-CM

## 2016-06-29 DIAGNOSIS — R059 Cough, unspecified: Secondary | ICD-10-CM

## 2016-06-29 NOTE — Patient Instructions (Signed)
Medication Instructions:  Continue current medications  Labwork: None Ordered  Testing/Procedures: None Ordered  Follow-Up: Your physician wants you to follow-up in: 1 Year. You will receive a reminder letter in the mail two months in advance. If you don't receive a letter, please call our office to schedule the follow-up appointment.   Any Other Special Instructions Will Be Listed Below (If Applicable).        Happy Holidays   If you need a refill on your cardiac medications before your next appointment, please call your pharmacy.

## 2016-07-21 ENCOUNTER — Ambulatory Visit
Admission: RE | Admit: 2016-07-21 | Discharge: 2016-07-21 | Disposition: A | Discharge status: Home / Self Care | Payer: Medicare Other | Source: Ambulatory Visit | Attending: Internal Medicine | Admitting: Internal Medicine

## 2016-07-21 DIAGNOSIS — Z1231 Encounter for screening mammogram for malignant neoplasm of breast: Secondary | ICD-10-CM

## 2016-09-10 ENCOUNTER — Encounter: Payer: Self-pay | Admitting: Radiation Oncology

## 2016-09-10 ENCOUNTER — Telehealth: Payer: Self-pay | Admitting: Oncology

## 2016-09-10 ENCOUNTER — Ambulatory Visit
Admission: RE | Admit: 2016-09-10 | Discharge: 2016-09-10 | Disposition: A | Discharge status: Home / Self Care | Payer: Medicare Other | Source: Ambulatory Visit | Attending: Radiation Oncology | Admitting: Radiation Oncology

## 2016-09-10 VITALS — BP 130/64 | HR 63 | Temp 98.1°F | Ht 64.0 in | Wt 163.6 lb

## 2016-09-10 DIAGNOSIS — Z90710 Acquired absence of both cervix and uterus: Secondary | ICD-10-CM | POA: Insufficient documentation

## 2016-09-10 DIAGNOSIS — Z90722 Acquired absence of ovaries, bilateral: Secondary | ICD-10-CM | POA: Insufficient documentation

## 2016-09-10 DIAGNOSIS — Z79899 Other long term (current) drug therapy: Secondary | ICD-10-CM | POA: Insufficient documentation

## 2016-09-10 DIAGNOSIS — Z79810 Long term (current) use of selective estrogen receptor modulators (SERMs): Secondary | ICD-10-CM | POA: Diagnosis not present

## 2016-09-10 DIAGNOSIS — Z923 Personal history of irradiation: Secondary | ICD-10-CM | POA: Insufficient documentation

## 2016-09-10 DIAGNOSIS — Z7982 Long term (current) use of aspirin: Secondary | ICD-10-CM | POA: Insufficient documentation

## 2016-09-10 DIAGNOSIS — Z8542 Personal history of malignant neoplasm of other parts of uterus: Secondary | ICD-10-CM | POA: Diagnosis not present

## 2016-09-10 DIAGNOSIS — C541 Malignant neoplasm of endometrium: Secondary | ICD-10-CM

## 2016-09-10 DIAGNOSIS — Z08 Encounter for follow-up examination after completed treatment for malignant neoplasm: Secondary | ICD-10-CM | POA: Insufficient documentation

## 2016-09-10 NOTE — Progress Notes (Signed)
Terri Ortega is here for follow up.  She denies having pain.  She reports having a stomach virus with vomiting and diarrhea since Tuesday.  She denies having any bladder issues or vaginal bleeding.  She reports her energy level is good.  She is using a vaginal dilator.  BP 130/64 (BP Location: Right Arm, Patient Position: Sitting)   Pulse 63   Temp 98.1 F (36.7 C) (Oral)   Ht 5\' 4"  (1.626 m)   Wt 163 lb 9.6 oz (74.2 kg)   SpO2 100%   BMI 28.08 kg/m    Wt Readings from Last 3 Encounters:  09/10/16 163 lb 9.6 oz (74.2 kg)  06/29/16 165 lb 6.4 oz (75 kg)  03/05/16 178 lb 12.8 oz (81.1 kg)

## 2016-09-10 NOTE — Telephone Encounter (Signed)
Called and left a message regarding patient's 9 am follow up with Dr. Sondra Come.  Requested a return call.

## 2016-09-10 NOTE — Progress Notes (Signed)
Radiation Oncology         [phone] ________________________________  Name: Terri Ortega MRN: [id number]  Date: 09/10/2016  DOB: Feb 26, 1941  Follow-Up Visit Note  CC: Kandice Hams, MD  Nancy Marus, MD    ICD-9-CM ICD-10-CM   1. Endometrial cancer (HCC) 182.0 C54.1     Diagnosis:  Stage IB, grade 1 endometrioid adenocarcinoma   Oncology History   Stage IB grade 1 endometrioid endometrial cancer treated with robotic staging on 01/30/14. Patient met high/intermed risk factors based on outer half (65%) myometrial invasion in a woman older than 70. Dispositioned to receive adjuvant vaginal brachytherapy to reduce the risk for recurrence at the vaginal cuff.     Endometrial cancer ([place])   12/29/2013 Initial Diagnosis    Endometrial cancer. Grade 1. Hysteroscopy D&C      01/30/2014 Surgery    robotic hyst, bso, pelvic and PA node dissection at [facility] with Dr Nancy Marus      Interval Since Last Radiation:  2 years 5 months  03/20/14 - 04/17/14 : Proximal vagina treated to 30 Gy in 5 fractions. The patient was treated with intracavitary brachytherapy treatments using iridium 192 as the high-dose-rate source, a 3 cm diameter cylinder was used to deliver the patient's treatment, a 4 cm treatment length was prescribed. 6 Gy was prescribed to the vaginal mucosal surface.  Narrative:  The patient returns today for routine follow-up.   On review of systems, the patient denies having any pain. She reports having a stomach virus with vomiting and diarrhea since Tuesday 09/08/16. She denies any bowel.bladder issues at this time. She denies vaginal bleeding. The patient reports her energy level is good. She is using a vaginal dilator as directed at this time.  ALLERGIES:  has No Known Allergies.  Meds: Current Outpatient Prescriptions  Medication Sig Dispense Refill  . aspirin EC 81 MG tablet Take 81 mg by mouth at bedtime.     . Cholecalciferol (VITAMIN D3) 2000 UNITS  capsule Take 2,000 Units by mouth every morning.     . furosemide (LASIX) 20 MG tablet Take 20 mg by mouth every morning.     Marland Kitchen levothyroxine (SYNTHROID) 112 MCG tablet Take 112 mcg by mouth daily before breakfast.     . Multiple Vitamins-Minerals (WOMENS MULTIVITAMIN) TABS Take by mouth.    . Omega-3 1000 MG CAPS Take 2,000 capsules by mouth every morning.     . potassium chloride (K-DUR) 10 MEQ tablet Take 10 mEq by mouth every other day.     . raloxifene (EVISTA) 60 MG tablet Take 60 mg by mouth every morning.     . Ranitidine HCl (ZANTAC PO) Take 1 tablet by mouth every morning.     No current facility-administered medications for this encounter.     Physical Findings: The patient is in no acute distress. Patient is alert and oriented.  height is '5\' 4"'  (1.626 m) and weight is 163 lb 9.6 oz (74.2 kg). Her oral temperature is 98.1 F (36.7 C). Her blood pressure is 130/64 and her pulse is 63. Her oxygen saturation is 100%.  No palpable subclavicular or axillary adenopathy. The lungs are clear to auscultation. The heart has a regular rhythm and rate. The abdomen is soft and nontender with normal bowel sounds. No inguinal adenopathy is appreciated.  On pelvic examination the external genitalia were unremarkable. A speculum exam was performed. There are no mucosal lesions noted in the vaginal vault. On bimanual and rectovaginal examination there  were no pelvic masses appreciated.  Lab Findings: Lab Results  Component Value Date   WBC 8.1 10/31/2014   HGB 12.0 10/31/2014   HCT 36.4 10/31/2014   MCV 92.6 10/31/2014   PLT 183 10/31/2014    Radiographic Findings: No results found.  Impression:  No evidence of recurrence on clinical exam today.   Plan: The patient will follow up with Dr. Denman George in 6 months and will follow up with me in 1 year.  ____________________________________   Blair Promise, PhD, MD  This document serves as a record of services personally performed by Gery Pray, MD. It was created on his behalf by Maryla Morrow, a trained medical scribe. The creation of this record is based on the scribe's personal observations and the provider's statements to them. This document has been checked and approved by the attending provider.

## 2016-09-18 NOTE — Addendum Note (Signed)
Encounter addended by: Jacqulyn Liner, RN on: 09/18/2016 10:24 AM<BR>    Actions taken: Charge Capture section accepted

## 2016-11-20 ENCOUNTER — Encounter: Payer: Self-pay | Admitting: Podiatry

## 2016-11-20 ENCOUNTER — Ambulatory Visit (INDEPENDENT_AMBULATORY_CARE_PROVIDER_SITE_OTHER): Payer: Medicare Other | Admitting: Podiatry

## 2016-11-20 DIAGNOSIS — M722 Plantar fascial fibromatosis: Secondary | ICD-10-CM

## 2016-11-20 DIAGNOSIS — Q828 Other specified congenital malformations of skin: Secondary | ICD-10-CM | POA: Diagnosis not present

## 2016-11-20 NOTE — Progress Notes (Signed)
This patient presents to the office for painful calluses left forefoot.  She has been diagnosed and treated for porokeratosis.  She says her foot is painful walking and wearing her shoes.  She also hgas chronic plantar fascitis. She presents for preventative foot care services.   GENERAL APPEARANCE: Alert, conversant. Appropriately groomed. No acute distress.  VASCULAR: Pedal pulses are  palpable at  [facility] and PT bilateral.  Capillary refill time is immediate to all digits,  Normal temperature gradient.    NEUROLOGIC: sensation is normal to 5.07 monofilament at 5/5 sites bilateral.  Light touch is intact bilateral, Muscle strength normal.  MUSCULOSKELETAL: acceptable muscle strength, tone and stability bilateral.  Intrinsic muscluature intact bilateral.  Rectus appearance of foot and digits noted left foot.  HAV 1st MPJ  Right foot.  Arthritic midfoot left foot. History of pain through arch.  DERMATOLOGIC: skin color, texture, and turgor are within normal limits.  Porokeratosis sub 2,3 left foot.  Porokeratosis left foot.  Plantar fascitis B/L  ROV  Debride porokeratosis  Discussed future orthotic therapy.     Gardiner Barefoot DPM

## 2017-03-17 ENCOUNTER — Ambulatory Visit: Payer: Medicare Other | Attending: Gynecologic Oncology | Admitting: Gynecologic Oncology

## 2017-03-17 ENCOUNTER — Encounter: Payer: Self-pay | Admitting: Gynecologic Oncology

## 2017-03-17 VITALS — BP 163/64 | HR 67 | Temp 98.2°F | Resp 18 | Wt 168.9 lb

## 2017-03-17 DIAGNOSIS — M810 Age-related osteoporosis without current pathological fracture: Secondary | ICD-10-CM | POA: Diagnosis not present

## 2017-03-17 DIAGNOSIS — C541 Malignant neoplasm of endometrium: Secondary | ICD-10-CM

## 2017-03-17 DIAGNOSIS — E0500 Thyrotoxicosis with diffuse goiter without thyrotoxic crisis or storm: Secondary | ICD-10-CM | POA: Insufficient documentation

## 2017-03-17 DIAGNOSIS — Z8542 Personal history of malignant neoplasm of other parts of uterus: Secondary | ICD-10-CM | POA: Insufficient documentation

## 2017-03-17 DIAGNOSIS — I509 Heart failure, unspecified: Secondary | ICD-10-CM | POA: Insufficient documentation

## 2017-03-17 DIAGNOSIS — Z90710 Acquired absence of both cervix and uterus: Secondary | ICD-10-CM | POA: Diagnosis not present

## 2017-03-17 DIAGNOSIS — Z08 Encounter for follow-up examination after completed treatment for malignant neoplasm: Secondary | ICD-10-CM | POA: Diagnosis not present

## 2017-03-17 DIAGNOSIS — E039 Hypothyroidism, unspecified: Secondary | ICD-10-CM | POA: Insufficient documentation

## 2017-03-17 DIAGNOSIS — I110 Hypertensive heart disease with heart failure: Secondary | ICD-10-CM | POA: Diagnosis not present

## 2017-03-17 DIAGNOSIS — M419 Scoliosis, unspecified: Secondary | ICD-10-CM | POA: Diagnosis not present

## 2017-03-17 NOTE — Progress Notes (Signed)
GYN ONC FOLLOWUP NOTE  Assessment:    76 y.o. 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   S/p robotic hysterectomy, BSO, lymphadenectomy on 01/30/14 in [place]. No LVSI, 65% myometrial invasion, negative pelvic washings and negative lymph nodes. S/p adjuvant vaginal brachytherapy in November 2015. No evidence of recurrence on surveillance examination.  Plan: 1) Disease counseling - We reviewed symptoms concerning for recurrence (including vaginal bleeding, pelvic or abdominal pains, new lower extremity edema, cough, change in bladder or bowel habit, or weight loss) and the patient was informed to contact us if she notes these symptoms in between her scheduled surveillance visits.  2)  Return to clinic in 3 months to see Dr Dwana Curd and in 9 months to see me (August 2019).   HPI:  Terri Ortega is a 76 y.o. year old woman initially seen in consultation in June, 2015 for grade 1 endometrial cancer.  She then underwent a robotic assisted total laparoscopic hysterectomy, bilateral salpingo-oophorectomy, pelvic and para-aortic lymphadenectomy (with SLN mapping) on 0/99/83 without complications.  Her postoperative course was uncomplicated.  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 She was dispositioned to receive adjuvant vaginal break E therapy which she completed in November of 2015.  Interval Hx:  She is seen today for a followup visit. She denies symptoms concerning for recurrence (including vaginal bleeding, pelvic or abdominal pains, new lower extremity edema, cough, change in bladder or bowel habit, or weight loss). She is sexually active and denies difficulty or discomfort with this. She is continuing to use her vaginal dilator.  She had a colonoscopy in May, 2016 with benign polyps removed.  No complaints or concerns. She is  sexually active.  Review of systems: Constitutional:  She has no weight gain or weight loss. She has no fever or chills. Eyes: No blurred vision Ears, Nose, Mouth, Throat: no nasal drip Cardiovascular: + chest and upper back pain and SOB Respiratory:  + shortness of breath, No wheezing or cough Gastrointestinal: She has normal bowel movements without diarrhea or constipation. She denies any nausea or vomiting. She denies blood in her stool or heart burn. Genitourinary:  She denies pelvic pain, pelvic pressure or changes in her urinary function. She has no hematuria, dysuria, or incontinence. She has no irregular vaginal bleeding or vaginal discharge Musculoskeletal: Denies muscle weakness or joint pains.  Skin:  She has no skin changes, rashes or itching Neurological:  Denies dizziness or headaches. No neuropathy, no numbness or tingling. Psychiatric:  She denies depression or anxiety. Hematologic/Lymphatic:   No easy bruising or bleeding  No Known Allergies   Current Outpatient Prescriptions on File Prior to Visit  Medication Sig Dispense Refill  . aspirin EC 81 MG tablet Take 81 mg by mouth at bedtime.     . Cholecalciferol (VITAMIN D3) 2000 UNITS capsule Take 2,000 Units by mouth every morning.     . furosemide (LASIX) 20 MG tablet Take 20 mg by mouth every morning.     Marland Kitchen levothyroxine (SYNTHROID) 112 MCG tablet Take 112 mcg by mouth daily before breakfast.     . Multiple Vitamins-Minerals (WOMENS MULTIVITAMIN) TABS Take by mouth.    . Omega-3 1000 MG CAPS Take 2,000 capsules by mouth every morning.     . potassium chloride (K-DUR) 10 MEQ tablet Take 10 mEq by mouth every other day.     Marland Kitchen  raloxifene (EVISTA) 60 MG tablet Take 60 mg by mouth every morning.     . Ranitidine HCl (ZANTAC PO) Take 1 tablet by mouth every morning.     No current facility-administered medications on file prior to visit.      Past Medical History:  Diagnosis Date  . CHF (congestive heart failure)  ([place])   . Graves disease   . HTN (hypertension)   . Hypothyroid   . Osteoporosis   . Radiation 03/20/14, 03/27/14, 04/05/14, 04/12/14, 04/17/14   bracytherapy to proximal vagina 30 gray  . Scoliosis   . Shortness of breath dyspnea    x1 episode -visit ERD -stress test 12-07-14 negative  . Uterine cancer ([place])    surgery and radiation x 5 treatments-last 9'15   Past Surgical History:  Procedure Laterality Date  . BUNIONECTOMY     left  . CATARACT EXTRACTION, BILATERAL Bilateral    last done 11-21-14  . COLONOSCOPY WITH PROPOFOL N/A 12/25/2014   Procedure: COLONOSCOPY WITH PROPOFOL;  Surgeon: Garlan Fair, MD;  Location: [facility];  Service: Endoscopy;  Laterality: N/A;  . JOINT REPLACEMENT     RTKA  . ROBOTIC ASSISTED TOTAL HYSTERECTOMY WITH BILATERAL SALPINGO OOPHERECTOMY  01/30/14   with lymph node biopsy  . TOTAL KNEE ARTHROPLASTY     right   Social History   Social History  . Marital status: Married    Spouse name: N/A  . Number of children: 3  . Years of education: N/A   Occupational History  . administrative assitant    Social History Main Topics  . Smoking status: Never Smoker  . Smokeless tobacco: Never Used  . Alcohol use No  . Drug use: No  . Sexual activity: Not Currently   Other Topics Concern  . Not on file   Social History Narrative  . No narrative on file   Family History  Problem Relation Age of Onset  . Heart disease Mother     Oncology History   Stage IB grade 1 endometrioid endometrial cancer treated with robotic staging on 01/30/14. Patient met high/intermed risk factors based on outer half (65%) myometrial invasion in a woman older than 70. Dispositioned to receive adjuvant vaginal brachytherapy to reduce the risk for recurrence at the vaginal cuff.     Endometrial cancer ([place])   12/29/2013 Initial Diagnosis    Endometrial cancer. Grade 1. Hysteroscopy D&C      01/30/2014 Surgery    robotic hyst, bso, pelvic and PA node dissection at  [facility] with Dr Nancy Marus        Physical Exam: Blood pressure (!) 163/64, pulse 67, temperature 98.2 F (36.8 C), resp. rate 18, weight 168 lb 14.4 oz (76.6 kg), SpO2 100 %. General: Well dressed, well nourished in no apparent distress.   HEENT:  Normocephalic and atraumatic, no lesions.  Extraocular muscles intact. Sclerae anicteric. Pupils equal, round, reactive. No mouth sores or ulcers. Thyroid is normal size, not nodular, midline. Skin:  No lesions or rashes. Breasts:  deferred Lungs:  deferred Cardiovascular:  deferred Abdomen:  Soft, nontender, nondistended.  No palpable masses.  No hepatosplenomegaly.  No ascites. Normal bowel sounds.  No hernias.  Incisions are completely healed. Genitourinary: Normal EGBUS  Vaginal cuff intact.  No bleeding or discharge.  No cul de sac fullness. Mild radiation changes to mucosa. Extremities: No cyanosis, clubbing or edema.  No calf tenderness or erythema. No palpable cords. Psychiatric: Mood and affect are appropriate. Neurological: Awake, alert  and oriented x 3. Sensation is intact, no neuropathy.  Musculoskeletal: No pain, normal strength and range of motion.  Donaciano Eva, MD

## 2017-03-17 NOTE — Patient Instructions (Signed)
Please call our office in May 2019 to schedule your next follow up with Dr. Denman George, which will be due in August 2019.

## 2017-07-22 ENCOUNTER — Ambulatory Visit: Payer: Medicare Other | Admitting: Podiatry

## 2017-07-22 ENCOUNTER — Encounter: Payer: Self-pay | Admitting: Podiatry

## 2017-07-22 DIAGNOSIS — Q828 Other specified congenital malformations of skin: Secondary | ICD-10-CM

## 2017-07-22 DIAGNOSIS — M2042 Other hammer toe(s) (acquired), left foot: Secondary | ICD-10-CM

## 2017-09-07 ENCOUNTER — Other Ambulatory Visit: Payer: Self-pay | Admitting: Internal Medicine

## 2017-09-07 DIAGNOSIS — Z139 Encounter for screening, unspecified: Secondary | ICD-10-CM

## 2017-09-16 ENCOUNTER — Ambulatory Visit: Payer: Self-pay | Admitting: Radiation Oncology

## 2017-09-23 ENCOUNTER — Other Ambulatory Visit: Payer: Self-pay

## 2017-09-23 ENCOUNTER — Encounter: Payer: Self-pay | Admitting: Radiation Oncology

## 2017-09-23 ENCOUNTER — Ambulatory Visit
Admission: RE | Admit: 2017-09-23 | Discharge: 2017-09-23 | Disposition: A | Discharge status: Home / Self Care | Payer: Medicare Other | Source: Ambulatory Visit | Attending: Radiation Oncology | Admitting: Radiation Oncology

## 2017-09-23 VITALS — BP 153/76 | HR 68 | Temp 98.5°F | Ht 64.0 in | Wt 173.2 lb

## 2017-09-23 DIAGNOSIS — Z79899 Other long term (current) drug therapy: Secondary | ICD-10-CM | POA: Insufficient documentation

## 2017-09-23 DIAGNOSIS — Z7982 Long term (current) use of aspirin: Secondary | ICD-10-CM | POA: Insufficient documentation

## 2017-09-23 DIAGNOSIS — Z79890 Hormone replacement therapy (postmenopausal): Secondary | ICD-10-CM | POA: Insufficient documentation

## 2017-09-23 DIAGNOSIS — C541 Malignant neoplasm of endometrium: Secondary | ICD-10-CM | POA: Diagnosis present

## 2017-09-23 NOTE — Progress Notes (Signed)
Radiation Oncology         [phone] ________________________________  Name: Terri Ortega MRN: [id number]  Date: 09/23/2017  DOB: 01/08/1941  Follow-Up Visit Note  CC: Polite, Ronald, MD  Gehrig, Paola, MD    ICD-10-CM   1. Endometrial cancer (HCC) C54.1     Diagnosis:  Stage IB, grade 1 endometrioid adenocarcinoma   Interval Since Last Radiation:  3 years 5 months  03/20/14 - 04/17/14 : Proximal vagina treated to 30 Gy in 5 fractions. The patient was treated with intracavitary brachytherapy treatments using iridium 192 as the high-dose-rate source, a 3 cm diameter cylinder was used to deliver the patient's treatment, a 4 cm treatment length was prescribed. 6 Gy was prescribed to the vaginal mucosal surface.  Oncology History   Stage IB grade 1 endometrioid endometrial cancer treated with robotic staging on 01/30/14. Patient met high/intermed risk factors based on outer half (65%) myometrial invasion in a woman older than 70. Dispositioned to receive adjuvant vaginal brachytherapy to reduce the risk for recurrence at the vaginal cuff.     Endometrial cancer (HCC)   12/29/2013 Initial Diagnosis    Endometrial cancer. Grade 1. Hysteroscopy D&C      01/30/2014 Surgery    robotic hyst, bso, pelvic and PA node dissection at [facility] with Dr Paola Gehrig       Narrative:  The patient returns today for routine follow-up. She is doing well overall. She is scheduled for a routine screening mammogram on tomorrow, 09/24/2017. She is still using the vaginal dilator every 2-3 months and she is still sexually active at this time.   On review of systems, the patient denies cough, abdominal pain, pelvic pain, BLE swelling, vaginal bleeding, post-coital bleeding, rectal bleeding, and any other symptoms.    ALLERGIES:  has No Known Allergies.  Meds: Current Outpatient Medications  Medication Sig Dispense Refill  . Cholecalciferol (VITAMIN D3) 2000 UNITS capsule Take 2,000 Units  by mouth every morning.     . furosemide (LASIX) 20 MG tablet Take 20 mg by mouth every morning.     . levothyroxine (SYNTHROID) 112 MCG tablet Take 112 mcg by mouth daily before breakfast.     . Multiple Vitamins-Minerals (WOMENS MULTIVITAMIN) TABS Take by mouth.    . Omega-3 1000 MG CAPS Take 2,000 capsules by mouth every morning.     . potassium chloride (K-DUR) 10 MEQ tablet Take 10 mEq by mouth every other day.     . raloxifene (EVISTA) 60 MG tablet Take 60 mg by mouth every morning.     . Ranitidine HCl (ZANTAC PO) Take 1 tablet by mouth every morning.    . aspirin EC 81 MG tablet Take 81 mg by mouth at bedtime.      No current facility-administered medications for this encounter.     Physical Findings: The patient is in no acute distress. Patient is alert and oriented.  height is 5' 4" (1.626 m) and weight is 173 lb 3.2 oz (78.6 kg). Her oral temperature is 98.5 F (36.9 C). Her blood pressure is 153/76 (abnormal) and her pulse is 68. Her oxygen saturation is 97%.   Lungs are clear to auscultation bilaterally. Heart has regular rate and rhythm. No palpable cervical, supraclavicular, or axillary adenopathy. Abdomen soft, non-tender, normal bowel sounds.   On pelvic examination the external genitalia were unremarkable. A speculum exam was performed. There are no mucosal lesions noted in the vaginal vault. Mild radiation changes noted in the proximal   vagina. On bimanual and rectovaginal examination there were no pelvic masses appreciated.   Lab Findings: Lab Results  Component Value Date   WBC 8.1 10/31/2014   HGB 12.0 10/31/2014   HCT 36.4 10/31/2014   MCV 92.6 10/31/2014   PLT 183 10/31/2014    Radiographic Findings: No results found.  Impression:  No evidence of recurrence on clinical exam today.   Plan: Routine follow up in Radiation Oncology in 1 year. The patient will follow up with Dr. Rossi in 6 months.   ____________________________________    D. , PhD,  MD  This document serves as a record of services personally performed by  , MD. It was created on his behalf by Soijett Blue, a trained medical scribe. The creation of this record is based on the scribe's personal observations and the provider's statements to them. This document has been checked and approved by the attending provider.  

## 2017-09-23 NOTE — Progress Notes (Signed)
Terri Ortega is here for follow up.  She denies having any pain.  She reports having incontinence of urine which she thinks is from taking Lasix.  She denies having any bowel issues or vaginal bleeding or discharge.  She reports having a poor energy level.  She is using a vaginal dilator occasionally.  BP (!) 153/76 (BP Location: Right Arm, Patient Position: Sitting)   Pulse 68   Temp 98.5 F (36.9 C) (Oral)   Ht 5\' 4"  (1.626 m)   Wt 173 lb 3.2 oz (78.6 kg)   SpO2 97%   BMI 29.73 kg/m    Wt Readings from Last 3 Encounters:  09/23/17 173 lb 3.2 oz (78.6 kg)  03/17/17 168 lb 14.4 oz (76.6 kg)  09/10/16 163 lb 9.6 oz (74.2 kg)

## 2017-09-24 ENCOUNTER — Ambulatory Visit
Admission: RE | Admit: 2017-09-24 | Discharge: 2017-09-24 | Disposition: A | Discharge status: Home / Self Care | Payer: Medicare Other | Source: Ambulatory Visit | Attending: Internal Medicine | Admitting: Internal Medicine

## 2017-09-24 DIAGNOSIS — Z139 Encounter for screening, unspecified: Secondary | ICD-10-CM

## 2018-02-17 ENCOUNTER — Telehealth: Payer: Self-pay | Admitting: *Deleted

## 2018-02-17 NOTE — Telephone Encounter (Signed)
Called and spoke with the patient. Scheduled the follow up appt for September 13th at 11am. Patient to arrive at 10:45am

## 2018-02-18 ENCOUNTER — Telehealth: Payer: Self-pay | Admitting: *Deleted

## 2018-02-18 NOTE — Telephone Encounter (Signed)
Returned patient's call about her appointment with Dr. Denman George. I left patient a message that her appointment with Dr. Denman George is scheduled for September 13th, 2019 at 11am.

## 2018-03-28 ENCOUNTER — Ambulatory Visit: Payer: Medicare Other | Admitting: Podiatry

## 2018-03-28 ENCOUNTER — Encounter: Payer: Self-pay | Admitting: Podiatry

## 2018-03-28 DIAGNOSIS — M25559 Pain in unspecified hip: Secondary | ICD-10-CM | POA: Insufficient documentation

## 2018-03-28 DIAGNOSIS — E663 Overweight: Secondary | ICD-10-CM | POA: Insufficient documentation

## 2018-03-28 DIAGNOSIS — Q828 Other specified congenital malformations of skin: Secondary | ICD-10-CM

## 2018-03-28 DIAGNOSIS — C55 Malignant neoplasm of uterus, part unspecified: Secondary | ICD-10-CM | POA: Insufficient documentation

## 2018-03-28 DIAGNOSIS — I10 Essential (primary) hypertension: Secondary | ICD-10-CM | POA: Insufficient documentation

## 2018-03-30 NOTE — Progress Notes (Signed)
Subjective:   Patient ID: Terri Ortega, female   DOB: 77 y.o.   MRN: [id number]   HPI Patient presents with inflamed area underneath the left foot with no history of injury to this and not sure what may have caused it but it feels like she is walking on a rock   ROS      Objective:  Physical Exam  Neurovascular status intact with several month history of inflammation pain plantar aspect left foot     Assessment:  Inflammatory capsulitis plantar left localized in nature     Plan:  H&P x-ray reviewed and careful injection administered 3 mg Kenalog 5 mg Xylocaine and debrided plantar lesion to reduce pressure.  Reappoint as symptoms indicate  X-rays were negative for fracture or bony impingement injury

## 2018-04-15 ENCOUNTER — Inpatient Hospital Stay: Payer: Medicare Other | Attending: Gynecologic Oncology | Admitting: Gynecologic Oncology

## 2018-04-15 ENCOUNTER — Encounter: Payer: Self-pay | Admitting: Gynecologic Oncology

## 2018-04-15 VITALS — BP 162/59 | HR 62 | Temp 98.0°F | Resp 18 | Ht 64.0 in | Wt 176.3 lb

## 2018-04-15 DIAGNOSIS — Z8542 Personal history of malignant neoplasm of other parts of uterus: Secondary | ICD-10-CM | POA: Diagnosis not present

## 2018-04-15 DIAGNOSIS — Z90722 Acquired absence of ovaries, bilateral: Secondary | ICD-10-CM | POA: Diagnosis not present

## 2018-04-15 DIAGNOSIS — Z08 Encounter for follow-up examination after completed treatment for malignant neoplasm: Secondary | ICD-10-CM | POA: Diagnosis present

## 2018-04-15 DIAGNOSIS — Z923 Personal history of irradiation: Secondary | ICD-10-CM | POA: Diagnosis not present

## 2018-04-15 DIAGNOSIS — Z90710 Acquired absence of both cervix and uterus: Secondary | ICD-10-CM | POA: Diagnosis not present

## 2018-04-15 DIAGNOSIS — C541 Malignant neoplasm of endometrium: Secondary | ICD-10-CM

## 2018-04-15 NOTE — Patient Instructions (Signed)
Please notify Dr Denman George at phone number [phone] if you notice vaginal bleeding, new pelvic or abdominal pains, bloating, feeling full easy, or a change in bladder or bowel function.   Please return to see Dr Sondra Come in March, 2020.  Please contact Dr Serita Grit office in the summer of 2020 at the above phone number and request to see her in September.

## 2018-04-15 NOTE — Progress Notes (Signed)
GYN ONC FOLLOWUP NOTE  Assessment:    77 y.o. 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   S/p robotic hysterectomy, BSO, lymphadenectomy on 01/30/14 in [place]. No LVSI, 65% myometrial invasion, negative pelvic washings and negative lymph nodes. S/p adjuvant vaginal brachytherapy in November 2015. No evidence of recurrence on surveillance examination.  Plan: 1) Disease counseling - We reviewed symptoms concerning for recurrence (including vaginal bleeding, pelvic or abdominal pains, new lower extremity edema, cough, change in bladder or bowel habit, or weight loss) and the patient was informed to contact us if she notes these symptoms in between her scheduled surveillance visits.  2)  Return to clinic in 6 months to see Dr Dwana Curd and in 12 months to see me (September 2020).   HPI:  Terri Ortega is a 77 y.o. year old woman initially seen in consultation in June, 2015 for grade 1 endometrial cancer.  She then underwent a robotic assisted total laparoscopic hysterectomy, bilateral salpingo-oophorectomy, pelvic and para-aortic lymphadenectomy (with SLN mapping) on 1/61/09 without complications.  Her postoperative course was uncomplicated.  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 She was dispositioned to receive adjuvant vaginal break E therapy which she completed in November of 2015.  Interval Hx:  She is seen today for a followup visit. She denies symptoms concerning for recurrence (including vaginal bleeding, pelvic or abdominal pains, new lower extremity edema, cough, change in bladder or bowel habit, or weight loss). She is sexually active and denies difficulty or discomfort with this. She is continuing to use her vaginal dilator.  She had a colonoscopy in May, 2016 with benign polyps removed.  No complaints or  concerns. She is sexually active.  Review of systems: Constitutional:  She has no weight gain or weight loss. She has no fever or chills. Eyes: No blurred vision Ears, Nose, Mouth, Throat: no nasal drip Cardiovascular: stable chest and upper back pain and SOB Respiratory:  No shortness of breath, No wheezing or cough Gastrointestinal: She has normal bowel movements without diarrhea or constipation. She denies any nausea or vomiting. She denies blood in her stool or heart burn. Genitourinary:  She denies pelvic pain, pelvic pressure or changes in her urinary function. She has no hematuria, dysuria, or incontinence. She has no irregular vaginal bleeding or vaginal discharge Musculoskeletal: Denies muscle weakness or joint pains.  Skin:  She has no skin changes, rashes or itching Neurological:  Denies dizziness or headaches. No neuropathy, no numbness or tingling. Psychiatric:  She denies depression or anxiety. Hematologic/Lymphatic:   No easy bruising or bleeding  No Known Allergies   Current Outpatient Medications on File Prior to Visit  Medication Sig Dispense Refill  . Cholecalciferol (VITAMIN D3) 2000 UNITS capsule Take 2,000 Units by mouth every morning.     . furosemide (LASIX) 20 MG tablet Take 20 mg by mouth every morning.     Marland Kitchen levothyroxine (SYNTHROID) 88 MCG tablet Take by mouth daily before breakfast.     . Multiple Vitamins-Minerals (WOMENS MULTIVITAMIN) TABS Take by mouth.    . Omega-3 1000 MG CAPS Take 2,000 capsules by mouth every morning.     . potassium chloride (K-DUR) 10 MEQ tablet Take 10 mEq by mouth every other day.     . raloxifene (EVISTA) 60 MG tablet Take 60 mg by mouth every morning.     Marland Kitchen  Ranitidine HCl (ZANTAC PO) Take 1 tablet by mouth every morning.     No current facility-administered medications on file prior to visit.      Past Medical History:  Diagnosis Date  . CHF (congestive heart failure) ([place])   . Graves disease   . HTN (hypertension)   .  Hypothyroid   . Osteoporosis   . Radiation 03/20/14, 03/27/14, 04/05/14, 04/12/14, 04/17/14   bracytherapy to proximal vagina 30 gray  . Scoliosis   . Shortness of breath dyspnea    x1 episode -visit ERD -stress test 12-07-14 negative  . Uterine cancer ([place])    surgery and radiation x 5 treatments-last 9'15   Past Surgical History:  Procedure Laterality Date  . BUNIONECTOMY     left  . CATARACT EXTRACTION, BILATERAL Bilateral    last done 11-21-14  . COLONOSCOPY WITH PROPOFOL N/A 12/25/2014   Procedure: COLONOSCOPY WITH PROPOFOL;  Surgeon: Garlan Fair, MD;  Location: [facility];  Service: Endoscopy;  Laterality: N/A;  . JOINT REPLACEMENT     RTKA  . ROBOTIC ASSISTED TOTAL HYSTERECTOMY WITH BILATERAL SALPINGO OOPHERECTOMY  01/30/14   with lymph node biopsy  . TOTAL KNEE ARTHROPLASTY     right   Social History   Socioeconomic History  . Marital status: Married    Spouse name: Not on file  . Number of children: 3  . Years of education: Not on file  . Highest education level: Not on file  Occupational History  . Occupation: Chartered certified accountant  Social Needs  . Financial resource strain: Not on file  . Food insecurity:    Worry: Not on file    Inability: Not on file  . Transportation needs:    Medical: Not on file    Non-medical: Not on file  Tobacco Use  . Smoking status: Never Smoker  . Smokeless tobacco: Never Used  Substance and Sexual Activity  . Alcohol use: No  . Drug use: No  . Sexual activity: Not Currently  Lifestyle  . Physical activity:    Days per week: Not on file    Minutes per session: Not on file  . Stress: Not on file  Relationships  . Social connections:    Talks on phone: Not on file    Gets together: Not on file    Attends religious service: Not on file    Active member of club or organization: Not on file    Attends meetings of clubs or organizations: Not on file    Relationship status: Not on file  . Intimate partner violence:    Fear  of current or ex partner: Not on file    Emotionally abused: Not on file    Physically abused: Not on file    Forced sexual activity: Not on file  Other Topics Concern  . Not on file  Social History Narrative  . Not on file   Family History  Problem Relation Age of Onset  . Heart disease Mother     Oncology History   Stage IB grade 1 endometrioid endometrial cancer treated with robotic staging on 01/30/14. Patient met high/intermed risk factors based on outer half (65%) myometrial invasion in a woman older than 70. Dispositioned to receive adjuvant vaginal brachytherapy to reduce the risk for recurrence at the vaginal cuff.     Endometrial cancer ([place])   12/29/2013 Initial Diagnosis    Endometrial cancer. Grade 1. Hysteroscopy D&C    01/30/2014 Surgery    robotic hyst, bso,  pelvic and PA node dissection at [facility] with Dr Nancy Marus      Physical Exam: Blood pressure (!) 162/59, pulse 62, temperature 98 F (36.7 C), temperature source Oral, resp. rate 18, height '5\' 4"'  (1.626 m), weight 176 lb 4.8 oz (80 kg), SpO2 98 %. General: Well dressed, well nourished in no apparent distress.   HEENT:  Normocephalic and atraumatic, no lesions.  Extraocular muscles intact. Sclerae anicteric. Pupils equal, round, reactive. No mouth sores or ulcers. Thyroid is normal size, not nodular, midline. Skin:  No lesions or rashes. Breasts:  deferred Lungs:  deferred Cardiovascular:  deferred Abdomen:  Soft, nontender, nondistended.  No palpable masses.  No hepatosplenomegaly.  No ascites. Normal bowel sounds.  No hernias.  Incisions are completely healed. Genitourinary: Normal EGBUS  Vaginal cuff intact.  No bleeding or discharge.  No cul de sac fullness. Mild radiation changes to mucosa. Extremities: No cyanosis, clubbing or edema.  No calf tenderness or erythema. No palpable cords. Psychiatric: Mood and affect are appropriate. Neurological: Awake, alert and oriented x 3. Sensation is  intact, no neuropathy.  Musculoskeletal: No pain, normal strength and range of motion.  Thereasa Solo, MD

## 2018-09-16 ENCOUNTER — Other Ambulatory Visit (HOSPITAL_COMMUNITY): Payer: Self-pay | Admitting: Orthopedic Surgery

## 2018-09-16 ENCOUNTER — Other Ambulatory Visit: Payer: Self-pay | Admitting: Orthopedic Surgery

## 2018-09-16 DIAGNOSIS — Z96651 Presence of right artificial knee joint: Secondary | ICD-10-CM

## 2018-09-22 ENCOUNTER — Encounter: Payer: Self-pay | Admitting: Radiation Oncology

## 2018-09-22 ENCOUNTER — Other Ambulatory Visit: Payer: Self-pay

## 2018-09-22 ENCOUNTER — Ambulatory Visit
Admission: RE | Admit: 2018-09-22 | Discharge: 2018-09-22 | Disposition: A | Discharge status: Home / Self Care | Payer: Medicare Other | Source: Ambulatory Visit | Attending: Radiation Oncology | Admitting: Radiation Oncology

## 2018-09-22 VITALS — BP 169/56 | HR 67 | Temp 98.6°F | Resp 20 | Ht 64.0 in | Wt 174.5 lb

## 2018-09-22 DIAGNOSIS — Z923 Personal history of irradiation: Secondary | ICD-10-CM | POA: Diagnosis not present

## 2018-09-22 DIAGNOSIS — Z79899 Other long term (current) drug therapy: Secondary | ICD-10-CM | POA: Insufficient documentation

## 2018-09-22 DIAGNOSIS — Z7982 Long term (current) use of aspirin: Secondary | ICD-10-CM | POA: Diagnosis not present

## 2018-09-22 DIAGNOSIS — C541 Malignant neoplasm of endometrium: Secondary | ICD-10-CM | POA: Diagnosis present

## 2018-09-22 NOTE — Progress Notes (Signed)
Radiation Oncology         [phone] ________________________________  Name: Terri Ortega MRN: [id number]  Date: 09/22/2018  DOB: 1940-12-28  Follow-Up Visit Note  CC: [name], MD  Nancy Marus, MD    ICD-10-CM   1. Endometrial cancer [facility]) C54.1     Diagnosis:   78 y.o. female with Stage IB, grade 1 endometrioid adenocarcinoma  Interval Since Last Radiation:  4 years 4 months  Radiation treatment dates:   August 18, August 25, September 3, September 10, September 15 of 2015 Site/dose:   Proximal vagina / 30 gray in 5 fractions  Narrative:  The patient returns today for routine follow-up. She denies any new medical issues. She denies any pain. She denies dysuria or hematuria. She denies vaginal bleeding or discharge. She denies rectal bleeding, diarrhea, or constipation. She reports rhinorrhea today.         ALLERGIES:  has No Known Allergies.  Meds: Current Outpatient Medications  Medication Sig Dispense Refill  . amoxicillin (AMOXIL) 500 MG capsule For dental appts    . aspirin-sod bicarb-citric acid (ALKA-SELTZER) 325 MG TBEF tablet Take 325 mg by mouth at bedtime as needed.    . Cholecalciferol (VITAMIN D3) 2000 UNITS capsule Take 2,000 Units by mouth every morning.     . furosemide (LASIX) 20 MG tablet Take 20 mg by mouth every morning.     Marland Kitchen levothyroxine (SYNTHROID) 88 MCG tablet Take by mouth daily before breakfast.     . Multiple Vitamins-Minerals (WOMENS MULTIVITAMIN) TABS Take by mouth.    . Omega-3 1000 MG CAPS Take 2,000 capsules by mouth every morning.     . potassium chloride (K-DUR) 10 MEQ tablet Take 10 mEq by mouth every other day.     . raloxifene (EVISTA) 60 MG tablet Take 60 mg by mouth every morning.      No current facility-administered medications for this encounter.     Physical Findings: The patient is in no acute distress. Patient is alert and oriented.  height is 5\' 4"  (1.626 m) and weight is 174 lb 8 oz (79.2 kg). Her oral  temperature is 98.6 F (37 C). Her blood pressure is 169/56 (abnormal) and her pulse is 67. Her respiration is 20 and oxygen saturation is 98%.   Lungs are clear to auscultation bilaterally. Heart has regular rate and rhythm. No palpable cervical, supraclavicular, or axillary adenopathy. Abdomen soft, non-tender, normal bowel sounds.  On pelvic examination the external genitalia were unremarkable. A speculum exam was performed. There are no mucosal lesions noted in the vaginal vault. Vaginal cuff intact.  On bimanual examination there were no pelvic masses appreciated.  Lab Findings: Lab Results  Component Value Date   WBC 8.1 10/31/2014   HGB 12.0 10/31/2014   HCT 36.4 10/31/2014   MCV 92.6 10/31/2014   PLT 183 10/31/2014    Radiographic Findings: No results found.  Impression:  Stage IB, grade 1 endometrioid adenocarcinoma. No evidence of recurrence on clinical exam.  Plan:  PRN follow-up in radiation oncology. Patient will follow up with Dr. Denman George in the fall which will be 5 years out from her treatment. The patient will then likely return to her gynecologist, Dr. Dory Horn, or other staff in his office if he is retired for routine GYN follow-up.  ____________________________________  Blair Promise, PhD, MD  This document serves as a record of services personally performed by Gery Pray, MD. It was created on his behalf by Rae Lips, a  trained medical scribe. The creation of this record is based on the scribe's personal observations and the provider's statements to them. This document has been checked and approved by the attending provider.

## 2018-09-22 NOTE — Progress Notes (Signed)
Pt presents today for f/u with Dr. Sondra Come. Pt is unaccompanied. Pt denies c/o pain. Pt denies dysuria/hematuria. Pt denies vaginal bleeding/discharge. Pt denies rectal bleeding, diarrhea/constipation.   BP (!) 169/56 (BP Location: Left Arm, Patient Position: Sitting)   Pulse 67   Temp 98.6 F (37 C) (Oral)   Resp 20   Ht 5\' 4"  (1.626 m)   Wt 174 lb 8 oz (79.2 kg)   SpO2 98%   BMI 29.95 kg/m   Wt Readings from Last 3 Encounters:  09/22/18 174 lb 8 oz (79.2 kg)  04/15/18 176 lb 4.8 oz (80 kg)  09/23/17 173 lb 3.2 oz (78.6 kg)   Loma Sousa, RN BSN

## 2018-09-26 ENCOUNTER — Encounter (HOSPITAL_COMMUNITY)
Admission: RE | Admit: 2018-09-26 | Discharge: 2018-09-26 | Disposition: A | Discharge status: Home / Self Care | Payer: Medicare Other | Source: Ambulatory Visit | Attending: Orthopedic Surgery | Admitting: Orthopedic Surgery

## 2018-09-26 ENCOUNTER — Ambulatory Visit (HOSPITAL_COMMUNITY)
Admission: RE | Admit: 2018-09-26 | Discharge: 2018-09-26 | Disposition: A | Discharge status: Home / Self Care | Payer: Medicare Other | Source: Ambulatory Visit | Attending: Orthopedic Surgery | Admitting: Orthopedic Surgery

## 2018-09-26 DIAGNOSIS — Z96651 Presence of right artificial knee joint: Secondary | ICD-10-CM | POA: Diagnosis present

## 2018-09-26 MED ORDER — TECHNETIUM TC 99M MEDRONATE IV KIT
20.2000 | PACK | Freq: Once | INTRAVENOUS | Status: AC | PRN
  Administered 2018-09-26: 20.2 via INTRAVENOUS

## 2018-10-10 ENCOUNTER — Other Ambulatory Visit: Payer: Self-pay | Admitting: Internal Medicine

## 2018-10-10 DIAGNOSIS — Z1231 Encounter for screening mammogram for malignant neoplasm of breast: Secondary | ICD-10-CM

## 2018-10-11 ENCOUNTER — Telehealth: Payer: Self-pay

## 2018-10-11 NOTE — Telephone Encounter (Signed)
Spoke with pt and schedule her an appt with Jory Sims, NP on 11/07/18 at 10 am. Pt verbalized understanding and thanked me for the call. Will fax over clearance notes to requesting surgeon's office.

## 2018-10-11 NOTE — Telephone Encounter (Signed)
   [facility] Pre-operative Risk Assessment    Request for surgical clearance:  1. What type of surgery is being performed? Right total knee revision  2. When is this surgery scheduled? 01/04/19  3. What type of clearance is required (medical clearance vs. Pharmacy clearance to hold med vs. Both)? Both  4. Are there any medications that need to be held prior to surgery and how long? Aspirin  5. Practice name and name of physician performing surgery? [facility]  Dr.Aluisio   6. What is your office phone number [phone]   7.   What is your office fax number [phone]  8.   Anesthesia type Choice   Kathyrn Lass 10/11/2018, 8:41 AM  _________________________________________________________________   (provider comments below)

## 2018-10-11 NOTE — Telephone Encounter (Signed)
   Primary Cardiologist:James Hochrein, MD  Chart reviewed as part of pre-operative protocol coverage. Because of Terri Ortega's past medical history and time since last visit, he/she will require a follow-up visit in order to better assess preoperative cardiovascular risk.  Pt has not been seen since 06/2016.  Pre-op covering staff: - Please schedule appointment and call patient to inform them. - Please contact requesting surgeon's office via preferred method (i.e, phone, fax) to inform them of need for appointment prior to surgery.  If applicable, this message will also be routed to pharmacy pool and/or primary cardiologist for input on holding anticoagulant/antiplatelet agent as requested below so that this information is available at time of patient's appointment.   Cecilie Kicks, NP  10/11/2018, 11:12 AM

## 2018-11-02 ENCOUNTER — Telehealth: Payer: Self-pay

## 2018-11-02 NOTE — Telephone Encounter (Signed)
Tried to call pt no VM- pt has appt next week this needs to be rescheduled procedure was rescheduled to June.

## 2018-11-02 NOTE — Telephone Encounter (Signed)
PT WOULD LIKE TO RESCHEDULE D/T CORONA VIRUS STATES NO NEW ISSUES NO COVID ISSUES/TRAVELING

## 2018-11-07 ENCOUNTER — Ambulatory Visit: Payer: Medicare Other | Admitting: Adult Health

## 2018-11-09 ENCOUNTER — Ambulatory Visit: Payer: Medicare Other

## 2018-11-09 NOTE — Telephone Encounter (Signed)
BP NO  scale  YES  MyChart  email [email or web address]  TRIED TO CALL PT I DID NOT NOTICE THAT THIS WAS FOR PRE-OP CLEARANCE FOR HER KNEE SURGERY I DO NOT SEE THAT CLEARANCE WAS REQUESTED BY DR ALUSIO's OFFICE. THIS APPT NEEDS TO BE SCHEDULED CLOSER TO June 3,2020 TO SEE IF WE ARE DOING PRE-OP APPTS AT THAT TIME.

## 2018-11-25 NOTE — Telephone Encounter (Signed)
Spoke w/ pt and scheduled her w/ Dr Percival Spanish on May 19th at 1:40 pm.  She needs extensive knee surgery, scheduled for 06/03 w/ Dr Maureen Ralphs.   Will route this to [place], she was not able to write down the appt, unable to leave voice mail.   **She needs a letter sent w/ appt info or another call to make sure she writes down the date/time.  Rosaria Ferries, PA-C 11/25/2018 4:54 PM Beeper [phone]

## 2018-11-30 NOTE — Telephone Encounter (Signed)
Pt is aware of appt for May 19th, info send to her Mychart, pt is aware we will call her 15 mins before appt time

## 2018-12-13 NOTE — H&P (Signed)
TOTAL KNEE REVISION ADMISSION H&P  Patient is being admitted for right revision total knee arthroplasty.  Subjective:  Chief Complaint:right knee pain.  HPI: Terri Ortega, 78 y.o. female, has a history of pain and functional disability in the right knee(s) due to arthritis and patient has failed non-surgical conservative treatments for greater than 12 weeks to include supervised PT with diminished ADL's post treatment and activity modification. The indications for the revision of the total knee arthroplasty are loosening of one or more components. Onset of symptoms was gradual starting 5 years ago with gradually worsening course since that time.  Prior procedures on the right knee(s) include arthroplasty.  Patient currently rates pain in the right knee(s) at 8 out of 10 with activity. There is worsening of pain with activity and weight bearing, pain that interferes with activities of daily living and instability.  Bone scan of the right knee shows increased uptake along the femoral and tibial aspects of the medial part of the knee. This condition presents safety issues increasing the risk of falls. There is no current active infection.  Patient Active Problem List   Diagnosis Date Noted  . Uterine cancer ([place]) 03/28/2018  . Arthralgia of hip or thigh 03/28/2018  . Benign essential HTN 03/28/2018  . Overweight 03/28/2018  . Heart disease 11/09/2014  . Leukocytosis 10/31/2014  . Diastolic congestive heart failure ([place]) 10/30/2014  . Hypothyroidism 10/30/2014  . Chest pain at rest 10/30/2014  . Chest pain 10/30/2014  . Endometrial cancer ([place]) 01/30/2014  . Dyspnea 01/11/2014  . Edema 01/11/2014  . Low back pain with right-sided sciatica 12/13/2013  . Degeneration of lumbar or lumbosacral intervertebral disc 12/13/2013  . Sciatica 12/13/2013   Past Medical History:  Diagnosis Date  . CHF (congestive heart failure) ([place])   . Graves disease   . HTN (hypertension)   . Osteoporosis   .  Radiation 03/20/14, 03/27/14, 04/05/14, 04/12/14, 04/17/14   bracytherapy to proximal vagina 30 gray  . Scoliosis   . Shortness of breath dyspnea    x1 episode -visit [facility] -stress test 12-07-14 negative  . Uterine cancer ([place])    surgery and radiation x 5 treatments-last 9'15    Past Surgical History:  Procedure Laterality Date  . BUNIONECTOMY     left  . CATARACT EXTRACTION, BILATERAL Bilateral    last done 11-21-14  . COLONOSCOPY WITH PROPOFOL N/A 12/25/2014   Procedure: COLONOSCOPY WITH PROPOFOL;  Surgeon: Garlan Fair, MD;  Location: [facility];  Service: Endoscopy;  Laterality: N/A;  . JOINT REPLACEMENT     RTKA  . ROBOTIC ASSISTED TOTAL HYSTERECTOMY WITH BILATERAL SALPINGO OOPHERECTOMY  01/30/14   with lymph node biopsy  . TOTAL KNEE ARTHROPLASTY     right    No current facility-administered medications for this encounter.    Current Outpatient Medications  Medication Sig Dispense Refill Last Dose  . amoxicillin (AMOXIL) 500 MG capsule For dental appts   Taking  . aspirin-sod bicarb-citric acid (ALKA-SELTZER) 325 MG TBEF tablet Take 325 mg by mouth at bedtime as needed.   Taking  . Cholecalciferol (VITAMIN D3) 2000 UNITS capsule Take 2,000 Units by mouth every morning.    Taking  . furosemide (LASIX) 20 MG tablet Take 20 mg by mouth every morning.    Taking  . levothyroxine (SYNTHROID) 88 MCG tablet Take by mouth daily before breakfast.    Taking  . Multiple Vitamins-Minerals (WOMENS MULTIVITAMIN) TABS Take by mouth.   Taking  . Omega-3 1000 MG  CAPS Take 2,000 capsules by mouth every morning.    Taking  . potassium chloride (K-DUR) 10 MEQ tablet Take 10 mEq by mouth every other day.    Taking  . raloxifene (EVISTA) 60 MG tablet Take 60 mg by mouth every morning.    Taking   No Known Allergies  Social History   Tobacco Use  . Smoking status: Never Smoker  . Smokeless tobacco: Never Used  Substance Use Topics  . Alcohol use: No    Family History  Problem Relation Age of  Onset  . Heart disease Mother       Review of Systems  Constitutional: Negative for chills and fever.  HENT: Negative for congestion, sore throat and tinnitus.   Eyes: Negative for double vision, photophobia and pain.  Respiratory: Negative for cough, shortness of breath and wheezing.   Cardiovascular: Negative for chest pain, palpitations and orthopnea.  Gastrointestinal: Negative for heartburn, nausea and vomiting.  Genitourinary: Negative for dysuria, frequency and urgency.  Musculoskeletal: Positive for joint pain.  Neurological: Negative for dizziness, weakness and headaches.     Objective:  Physical Exam  Well nourished and well developed.  General: Alert and oriented x3, cooperative and pleasant, no acute distress.  Head: normocephalic, atraumatic, neck supple.  Eyes: EOMI.  Respiratory: breath sounds clear in all fields, no wheezing, rales, or rhonchi. Cardiovascular: Regular rate and rhythm, no murmurs, gallops or rubs.  Abdomen: non-tender to palpation and soft, normoactive bowel sounds. Musculoskeletal:  Right Knee Exam:  No effusion.  Slight varus alignment while standing - this was noticed on her last visit, but there has been no significant change from her initial post-op alignment. Range of motion is 0-125 degrees.  Slight pseudolaxity when correcting her to neutral alignment - no gross instability.  Calves soft and nontender. Motor function intact in LE. Strength 5/5 LE bilaterally. Neuro: Distal pulses 2+. Sensation to light touch intact in LE.  Vital signs in last 24 hours: Blood pressure: 156/94 mmHg Pulse: 64 bpm  Labs:  Estimated body mass index is 29.95 kg/m as calculated from the following:   Height as of 09/22/18: 5\' 4"  (1.626 m).   Weight as of 09/22/18: 79.2 kg.  Imaging Review Bone scan of the right knee shows increased uptake along the femoral and tibial aspects of the medial part of the knee   Assessment/Plan:  End stage arthritis,  right knee(s) with failed previous arthroplasty.   The patient history, physical examination, clinical judgment of the provider and imaging studies are consistent with end stage degenerative joint disease of the right knee(s), previous total knee arthroplasty. Revision total knee arthroplasty is deemed medically necessary. The treatment options including medical management, injection therapy, arthroscopy and revision arthroplasty were discussed at length. The risks and benefits of revision total knee arthroplasty were presented and reviewed. The risks due to aseptic loosening, infection, stiffness, patella tracking problems, thromboembolic complications and other imponderables were discussed. The patient acknowledged the explanation, agreed to proceed with the plan and consent was signed. Patient is being admitted for inpatient treatment for surgery, pain control, PT, OT, prophylactic antibiotics, VTE prophylaxis, progressive ambulation and ADL's and discharge planning.The patient is planning to be discharged home.   Therapy Plans: outpatient therapy at [facility] Disposition: Home with daughter and husband Planned DVT Prophylaxis: Xarelto 10 mg daily (hx uterine CA) DME needed: [facility] PCP: Dr. Delfina Redwood TXA: IV Allergies: NKDA Anesthesia Concerns: None BMI: 29.1  - Patient was instructed on what medications to stop prior to  surgery. - Follow-up visit in 2 weeks with Dr. Wynelle Link - Begin physical therapy following surgery - Pre-operative lab work as pre-surgical testing - Prescriptions will be provided in hospital at time of discharge  Theresa Duty, PA-C Orthopedic Surgery [facility] [place]

## 2018-12-19 ENCOUNTER — Telehealth: Payer: Self-pay | Admitting: Cardiology

## 2018-12-19 NOTE — Progress Notes (Signed)
Virtual Visit via Video Note   This visit type was conducted due to national recommendations for restrictions regarding the COVID-19 Pandemic (e.g. social distancing) in an effort to limit this patient's exposure and mitigate transmission in our community.  Due to her co-morbid illnesses, this patient is at least at moderate risk for complications without adequate follow up.  This format is felt to be most appropriate for this patient at this time.  All issues noted in this document were discussed and addressed.  A limited physical exam was performed with this format.  Please refer to the patient's chart for her consent to telehealth for [facility].   Date:  12/20/2018   ID:  Terri Ortega, DOB Oct 02, 1940, MRN [id number]  Patient Location: Home Provider Location: Home  PCP:  [name], MD  Cardiologist:  Minus Breeding, MD  Electrophysiologist:  None   Evaluation Performed:  Follow-Up Visit  Chief Complaint:  Chest pain  History of Present Illness:    Terri Ortega is a 78 y.o. female who presents for follow up of a mildly reduced ejection fraction. I had seen her in the past for evaluation of dyspnea. She was hospitalized in March of  2016 with back pain and SOB.  Echocardiogram demonstrated that her ejection fraction which had been 60-65% previously was thought to be globally reduced at 45%.  However follow-up stress perfusion study demonstrated no ischemia in the EF was said to be 65%.  I repeated an echo when I saw her in March 2017.  It was essentially normal.     She is going to have a knee replaced.  Because of this she is referred for preop clearance.  She continues to get episodic chest discomfort and on careful discussion it was very similar to what she has had previously.  She will occasionally have a burning kind of misery in her upper abdomen.  The last one was about 2 to 3 weeks ago.  She will take an Alka-Seltzer and it will go away.  She feels a heaviness and  has to undo her bra.  She does not describe associated nausea vomiting or diaphoresis.  She does not have any shortness of breath, PND or orthopnea.  I evaluated this couple of times in the past and is a very similar description to what she had when she had a negative stress test in the past.  She is not describing any weight gain or swelling.  She is limited by knee pain but she is able to do walking through the grocery store and other chores and not bring on any of the symptoms.   The patient does not have symptoms concerning for COVID-19 infection (fever, chills, cough, or new shortness of breath).    Past Medical History:  Diagnosis Date  . Graves disease   . HTN (hypertension)   . Osteoporosis   . Radiation 03/20/14, 03/27/14, 04/05/14, 04/12/14, 04/17/14   bracytherapy to proximal vagina 30 gray  . Scoliosis   . Uterine cancer ([place])    surgery and radiation x 5 treatments-last 9'15   Past Surgical History:  Procedure Laterality Date  . BUNIONECTOMY     left  . CATARACT EXTRACTION, BILATERAL Bilateral    last done 11-21-14  . COLONOSCOPY WITH PROPOFOL N/A 12/25/2014   Procedure: COLONOSCOPY WITH PROPOFOL;  Surgeon: Garlan Fair, MD;  Location: [facility];  Service: Endoscopy;  Laterality: N/A;  . JOINT REPLACEMENT     RTKA  . ROBOTIC ASSISTED  TOTAL HYSTERECTOMY WITH BILATERAL SALPINGO OOPHERECTOMY  01/30/14   with lymph node biopsy  . TOTAL KNEE ARTHROPLASTY     right     Current Meds  Medication Sig  . amoxicillin (AMOXIL) 500 MG capsule For dental appts  . BIOTIN PO Take 5,000 mcg by mouth daily.  . Cholecalciferol (VITAMIN D3) 2000 UNITS capsule Take 2,000 Units by mouth every morning.   . furosemide (LASIX) 20 MG tablet Take 20 mg by mouth every morning.   Marland Kitchen levothyroxine (SYNTHROID) 88 MCG tablet Take by mouth daily before breakfast.   . Multiple Vitamins-Minerals (WOMENS MULTIVITAMIN) TABS Take by mouth.  Marland Kitchen omeprazole (PRILOSEC) 20 MG capsule Take 20 mg by mouth  daily.  . potassium chloride (K-DUR) 10 MEQ tablet Take 10 mEq by mouth every other day.   . raloxifene (EVISTA) 60 MG tablet Take 60 mg by mouth every morning.   Marland Kitchen SODIUM BICARBONATE PO Take 1,100 mg by mouth daily.  . [DISCONTINUED] aspirin-sod bicarb-citric acid (ALKA-SELTZER) 325 MG TBEF tablet Take 325 mg by mouth at bedtime as needed.     Allergies:   Patient has no known allergies.   Social History   Tobacco Use  . Smoking status: Never Smoker  . Smokeless tobacco: Never Used  Substance Use Topics  . Alcohol use: No  . Drug use: No     Family Hx: The patient's family history includes Heart disease (age of onset: 13) in her mother.  ROS:   Please see the history of present illness.    As stated in the HPI and negative for all other systems.   Prior CV studies:   The following studies were reviewed today:  Echo  Labs/Other Tests and Data Reviewed:    EKG:  No ECG reviewed.  Recent Labs: No results found for requested labs within last 8760 hours.   Recent Lipid Panel Lab Results  Component Value Date/Time   CHOL 147 10/31/2014 02:41 AM   TRIG 38 10/31/2014 02:41 AM   HDL 52 10/31/2014 02:41 AM   CHOLHDL 2.8 10/31/2014 02:41 AM   LDLCALC 87 10/31/2014 02:41 AM    Wt Readings from Last 3 Encounters:  12/20/18 175 lb 3.2 oz (79.5 kg)  09/22/18 174 lb 8 oz (79.2 kg)  04/15/18 176 lb 4.8 oz (80 kg)     Objective:    Vital Signs:  BP 133/75   Pulse 70   Ht 5\' 4"  (1.626 m)   Wt 175 lb 3.2 oz (79.5 kg)   BMI 30.07 kg/m    VITAL SIGNS:  reviewed GEN:  no acute distress RESPIRATORY:  normal respiratory effort, symmetric expansion NEURO:  alert and oriented x 3, no obvious focal deficit PSYCH:  normal affect      PREOP:    I need to look at her EKG when she goes to preop.  But pending this result and if it is unremarkable I would not suggest that she would need further cardiac work-up.  Chest pain is very atypical.  It is unchanged from previous.   CARDIOMYOPATHY:   Her ejection fraction was improved at the last visit and there would be no reason historically to think it was worse.  HTN:  The blood pressure is controlled.  She will continue the meds as listed.  AORTIC VALVE DISEASE:  This is mild.  This can be followed clinically.  CHEST PAIN:     We had another long discussion about this.  This is atypical.  It is  the same as what she had previously.  It is felt to be related to reflux.  No change in therapy or further testing is indicated.  COVID-19 Education: The signs and symptoms of COVID-19 were discussed with the patient and how to seek care for testing (follow up with PCP or arrange E-visit).  The importance of social distancing was discussed today.  Time:   Today, I have spent 27 minutes with the patient with telehealth technology discussing the above problems.     Medication Adjustments/Labs and Tests Ordered: Current medicines are reviewed at length with the patient today.  Concerns regarding medicines are outlined above.   Tests Ordered: No orders of the defined types were placed in this encounter.   Medication Changes: No orders of the defined types were placed in this encounter.   Disposition:  Follow up with me in one year.   Signed, Minus Breeding, MD  12/20/2018 2:51 PM    [facility]

## 2018-12-20 ENCOUNTER — Encounter: Payer: Self-pay | Admitting: Cardiology

## 2018-12-20 ENCOUNTER — Telehealth (INDEPENDENT_AMBULATORY_CARE_PROVIDER_SITE_OTHER): Payer: Medicare Other | Admitting: Cardiology

## 2018-12-20 VITALS — BP 133/75 | HR 70 | Ht 64.0 in | Wt 175.2 lb

## 2018-12-20 DIAGNOSIS — Z01810 Encounter for preprocedural cardiovascular examination: Secondary | ICD-10-CM | POA: Diagnosis not present

## 2018-12-20 DIAGNOSIS — I350 Nonrheumatic aortic (valve) stenosis: Secondary | ICD-10-CM

## 2018-12-20 DIAGNOSIS — I10 Essential (primary) hypertension: Secondary | ICD-10-CM

## 2018-12-20 DIAGNOSIS — R072 Precordial pain: Secondary | ICD-10-CM

## 2018-12-20 NOTE — Patient Instructions (Signed)

## 2018-12-22 ENCOUNTER — Ambulatory Visit
Admission: RE | Admit: 2018-12-22 | Discharge: 2018-12-22 | Disposition: A | Discharge status: Home / Self Care | Payer: Medicare Other | Source: Ambulatory Visit | Attending: Internal Medicine | Admitting: Internal Medicine

## 2018-12-22 ENCOUNTER — Other Ambulatory Visit: Payer: Self-pay

## 2018-12-22 DIAGNOSIS — Z1231 Encounter for screening mammogram for malignant neoplasm of breast: Secondary | ICD-10-CM

## 2018-12-29 NOTE — Progress Notes (Signed)
SPOKE W/Pt _     SCREENING SYMPTOMS OF COVID 19:   COUGH--No  RUNNY NOSE--- No  SORE THROAT---No  NASAL CONGESTION----No  SNEEZING----No  SHORTNESS OF BREATH---No  DIFFICULTY BREATHING---No  TEMP >100.0 -----No  UNEXPLAINED BODY ACHES------No  CHILLS --------No   HEADACHES ---------No  LOSS OF SMELL/ TASTE --------No    HAVE YOU OR ANY FAMILY MEMBER TRAVELLED PAST 14 DAYS OUT OF THE   COUNTY---No STATE----No COUNTRY---No-  HAVE YOU OR ANY FAMILY MEMBER BEEN EXPOSED TO ANYONE WITH COVID 19? No    

## 2018-12-29 NOTE — Progress Notes (Signed)
12-20-18 (Epic) Televisit with Cardiologist

## 2018-12-29 NOTE — Patient Instructions (Addendum)
Terri Ortega  12/29/2018   Your procedure is scheduled on: 01-04-19    Report to [facility] Main  Entrance    Report to Admitting at 7:30 AM   YOU NEED TO HAVE A COVID 19 TEST ON 01-02-19 at 10:00AM , THIS TEST MUST BE DONE BEFORE SURGERY, COME TO [place] ENTRANCE BETWEEN THE HOURS OF 900 AM AND 300 PM ON YOUR COVID TEST DATE. ONCE YOU COMPLETE THE COVID 19 TEST, YOUR QUARANTINE WILL BEGIN PER YOUR HANDOUT INSTRUCTIONS   Call this number if you have problems the morning of surgery [phone]    Remember: Do not eat food or drink liquids :After Midnight.    BRUSH YOUR TEETH MORNING OF SURGERY AND RINSE YOUR MOUTH OUT, NO CHEWING GUM CANDY OR MINTS.     Take these medicines the morning of surgery with A SIP OF WATER: Levothyroxine (Synthroid), Omeprazole-Sodium Bicarbonate (Zegrid)                                You may not have any metal on your body including hair pins and              piercings     Do not wear jewelry, make-up, lotions, powders or perfumes, deodorant              Do not wear nail polish.  Do not shave  48 hours prior to surgery.     Do not bring valuables to the hospital. [place].  Contacts, dentures or bridgework may not be worn into surgery.    Special Instructions: N/A              Please read over the following fact sheets you were given: _____________________________________________________________________             [facility] - Preparing for Surgery Before surgery, you can play an important role.  Because skin is not sterile, your skin needs to be as free of germs as possible.  You can reduce the number of germs on your skin by washing with CHG (chlorahexidine gluconate) soap before surgery.  CHG is an antiseptic cleaner which kills germs and bonds with the skin to continue killing germs even after washing. Please DO NOT use if you have an  allergy to CHG or antibacterial soaps.  If your skin becomes reddened/irritated stop using the CHG and inform your nurse when you arrive at Short Stay. Do not shave (including legs and underarms) for at least 48 hours prior to the first CHG shower.  You may shave your face/neck. Please follow these instructions carefully:  1.  Shower with CHG Soap the night before surgery and the  morning of Surgery.  2.  If you choose to wash your hair, wash your hair first as usual with your  normal  shampoo.  3.  After you shampoo, rinse your hair and body thoroughly to remove the  shampoo.                           4.  Use CHG as you would any other liquid soap.  You can apply chg directly  to the skin and wash  Gently with a scrungie or clean washcloth.  5.  Apply the CHG Soap to your body ONLY FROM THE NECK DOWN.   Do not use on face/ open                           Wound or open sores. Avoid contact with eyes, ears mouth and genitals (private parts).                       Wash face,  Genitals (private parts) with your normal soap.             6.  Wash thoroughly, paying special attention to the area where your surgery  will be performed.  7.  Thoroughly rinse your body with warm water from the neck down.  8.  DO NOT shower/wash with your normal soap after using and rinsing off  the CHG Soap.                9.  Pat yourself dry with a clean towel.            10.  Wear clean pajamas.            11.  Place clean sheets on your bed the night of your first shower and do not  sleep with pets. Day of Surgery : Do not apply any lotions/deodorants the morning of surgery.  Please wear clean clothes to the hospital/surgery center.  FAILURE TO FOLLOW THESE INSTRUCTIONS MAY RESULT IN THE CANCELLATION OF YOUR SURGERY PATIENT SIGNATURE_________________________________  NURSE  SIGNATURE__________________________________  ________________________________________________________________________   Adam Phenix  An incentive spirometer is a tool that can help keep your lungs clear and active. This tool measures how well you are filling your lungs with each breath. Taking long deep breaths may help reverse or decrease the chance of developing breathing (pulmonary) problems (especially infection) following:  A long period of time when you are unable to move or be active. BEFORE THE PROCEDURE   If the spirometer includes an indicator to show your best effort, your nurse or respiratory therapist will set it to a desired goal.  If possible, sit up straight or lean slightly forward. Try not to slouch.  Hold the incentive spirometer in an upright position. INSTRUCTIONS FOR USE  1. Sit on the edge of your bed if possible, or sit up as far as you can in bed or on a chair. 2. Hold the incentive spirometer in an upright position. 3. Breathe out normally. 4. Place the mouthpiece in your mouth and seal your lips tightly around it. 5. Breathe in slowly and as deeply as possible, raising the piston or the ball toward the top of the column. 6. Hold your breath for 3-5 seconds or for as long as possible. Allow the piston or ball to fall to the bottom of the column. 7. Remove the mouthpiece from your mouth and breathe out normally. 8. Rest for a few seconds and repeat Steps 1 through 7 at least 10 times every 1-2 hours when you are awake. Take your time and take a few normal breaths between deep breaths. 9. The spirometer may include an indicator to show your best effort. Use the indicator as a goal to work toward during each repetition. 10. After each set of 10 deep breaths, practice coughing to be sure your lungs are clear. If you have an incision (the cut made at the time of surgery),  support your incision when coughing by placing a pillow or rolled up towels firmly  against it. Once you are able to get out of bed, walk around indoors and cough well. You may stop using the incentive spirometer when instructed by your caregiver.  RISKS AND COMPLICATIONS  Take your time so you do not get dizzy or light-headed.  If you are in pain, you may need to take or ask for pain medication before doing incentive spirometry. It is harder to take a deep breath if you are having pain. AFTER USE  Rest and breathe slowly and easily.  It can be helpful to keep track of a log of your progress. Your caregiver can provide you with a simple table to help with this. If you are using the spirometer at home, follow these instructions: [place] IF:   You are having difficultly using the spirometer.  You have trouble using the spirometer as often as instructed.  Your pain medication is not giving enough relief while using the spirometer.  You develop fever of 100.5 F (38.1 C) or higher. SEEK IMMEDIATE MEDICAL CARE IF:   You cough up bloody sputum that had not been present before.  You develop fever of 102 F (38.9 C) or greater.  You develop worsening pain at or near the incision site. MAKE SURE YOU:   Understand these instructions.  Will watch your condition.  Will get help right away if you are not doing well or get worse. Document Released: 11/30/2006 Document Revised: 10/12/2011 Document Reviewed: 01/31/2007 ExitCare Patient Information 2014 ExitCare, [place].   ________________________________________________________________________  WHAT IS A BLOOD TRANSFUSION? Blood Transfusion Information  A transfusion is the replacement of blood or some of its parts. Blood is made up of multiple cells which provide different functions.  Red blood cells carry oxygen and are used for blood loss replacement.  White blood cells fight against infection.  Platelets control bleeding.  Plasma helps clot blood.  Other blood products are available for  specialized needs, such as hemophilia or other clotting disorders. BEFORE THE TRANSFUSION  Who gives blood for transfusions?   Healthy volunteers who are fully evaluated to make sure their blood is safe. This is blood bank blood. Transfusion therapy is the safest it has ever been in the practice of medicine. Before blood is taken from a donor, a complete history is taken to make sure that person has no history of diseases nor engages in risky social behavior (examples are intravenous drug use or sexual activity with multiple partners). The donor's travel history is screened to minimize risk of transmitting infections, such as malaria. The donated blood is tested for signs of infectious diseases, such as HIV and hepatitis. The blood is then tested to be sure it is compatible with you in order to minimize the chance of a transfusion reaction. If you or a relative donates blood, this is often done in anticipation of surgery and is not appropriate for emergency situations. It takes many days to process the donated blood. RISKS AND COMPLICATIONS Although transfusion therapy is very safe and saves many lives, the main dangers of transfusion include:   Getting an infectious disease.  Developing a transfusion reaction. This is an allergic reaction to something in the blood you were given. Every precaution is taken to prevent this. The decision to have a blood transfusion has been considered carefully by your caregiver before blood is given. Blood is not given unless the benefits outweigh the risks. AFTER THE TRANSFUSION  Right after receiving a blood transfusion, you will usually feel much better and more energetic. This is especially true if your red blood cells have gotten low (anemic). The transfusion raises the level of the red blood cells which carry oxygen, and this usually causes an energy increase.  The nurse administering the transfusion will monitor you carefully for complications. HOME CARE  INSTRUCTIONS  No special instructions are needed after a transfusion. You may find your energy is better. Speak with your caregiver about any limitations on activity for underlying diseases you may have. SEEK MEDICAL CARE IF:   Your condition is not improving after your transfusion.  You develop redness or irritation at the intravenous (IV) site. SEEK IMMEDIATE MEDICAL CARE IF:  Any of the following symptoms occur over the next 12 hours:  Shaking chills.  You have a temperature by mouth above 102 F (38.9 C), not controlled by medicine.  Chest, back, or muscle pain.  People around you feel you are not acting correctly or are confused.  Shortness of breath or difficulty breathing.  Dizziness and fainting.  You get a rash or develop hives.  You have a decrease in urine output.  Your urine turns a dark color or changes to pink, red, or brown. Any of the following symptoms occur over the next 10 days:  You have a temperature by mouth above 102 F (38.9 C), not controlled by medicine.  Shortness of breath.  Weakness after normal activity.  The white part of the eye turns yellow (jaundice).  You have a decrease in the amount of urine or are urinating less often.  Your urine turns a dark color or changes to pink, red, or brown. Document Released: 07/17/2000 Document Revised: 10/12/2011 Document Reviewed: 03/05/2008 [facility] Patient Information 2014 [place], [place].  _______________________________________________________________________

## 2018-12-30 ENCOUNTER — Ambulatory Visit: Payer: Medicare Other

## 2018-12-30 ENCOUNTER — Other Ambulatory Visit: Payer: Self-pay

## 2018-12-30 ENCOUNTER — Encounter (HOSPITAL_COMMUNITY)
Admission: RE | Admit: 2018-12-30 | Discharge: 2018-12-30 | Disposition: A | Discharge status: Home / Self Care | Payer: Medicare Other | Source: Ambulatory Visit | Attending: Orthopedic Surgery | Admitting: Orthopedic Surgery

## 2018-12-30 ENCOUNTER — Encounter (HOSPITAL_COMMUNITY): Payer: Self-pay

## 2018-12-30 DIAGNOSIS — Z01818 Encounter for other preprocedural examination: Secondary | ICD-10-CM | POA: Insufficient documentation

## 2018-12-30 LAB — PROTIME-INR
INR: 1.1 (ref 0.8–1.2)
Prothrombin Time: 14 seconds (ref 11.4–15.2)

## 2018-12-30 LAB — COMPREHENSIVE METABOLIC PANEL
ALT: 15 U/L (ref 0–44)
AST: 22 U/L (ref 15–41)
Albumin: 3.8 g/dL (ref 3.5–5.0)
Alkaline Phosphatase: 53 U/L (ref 38–126)
Anion gap: 5 (ref 5–15)
BUN: 23 mg/dL (ref 8–23)
CO2: 28 mmol/L (ref 22–32)
Calcium: 9 mg/dL (ref 8.9–10.3)
Chloride: 107 mmol/L (ref 98–111)
Creatinine, Ser: 0.81 mg/dL (ref 0.44–1.00)
GFR calc Af Amer: >60 mL/min (ref >60)
GFR calc non Af Amer: >60 mL/min (ref >60)
Glucose, Bld: 102 mg/dL — ABNORMAL HIGH (ref 70–99)
Potassium: 3.7 mmol/L (ref 3.5–5.1)
Sodium: 140 mmol/L (ref 135–145)
Total Bilirubin: 0.9 mg/dL (ref 0.3–1.2)
Total Protein: 7 g/dL (ref 6.5–8.1)

## 2018-12-30 LAB — CBC
HCT: 40.7 % (ref 36.0–46.0)
Hemoglobin: 13.3 g/dL (ref 12.0–15.0)
MCH: 32 pg (ref 26.0–34.0)
MCHC: 32.7 g/dL (ref 30.0–36.0)
MCV: 97.8 fL (ref 80.0–100.0)
Platelets: 200 10*3/uL (ref 150–400)
RBC: 4.16 MIL/uL (ref 3.87–5.11)
RDW: 12.7 % (ref 11.5–15.5)
WBC: 7.7 10*3/uL (ref 4.0–10.5)
nRBC: 0 % (ref 0.0–0.2)

## 2018-12-30 LAB — SURGICAL PCR SCREEN
MRSA, PCR: NEGATIVE
Staphylococcus aureus: NEGATIVE

## 2018-12-30 LAB — APTT: aPTT: 33 seconds (ref 24–36)

## 2019-01-02 ENCOUNTER — Other Ambulatory Visit (HOSPITAL_COMMUNITY)
Admission: RE | Admit: 2019-01-02 | Discharge: 2019-01-02 | Disposition: A | Discharge status: Home / Self Care | Payer: Medicare Other | Source: Ambulatory Visit | Attending: Orthopedic Surgery | Admitting: Orthopedic Surgery

## 2019-01-02 DIAGNOSIS — Z01818 Encounter for other preprocedural examination: Secondary | ICD-10-CM | POA: Diagnosis not present

## 2019-01-02 NOTE — Progress Notes (Signed)
Anesthesia Chart Review   Case:  [id number] Date/Time:  01/04/19 0945   Procedure:  TOTAL KNEE REVISION (Right ) - 148min   Anesthesia type:  Choice   Pre-op diagnosis:  failed right total knee arthroplasty   Location:  [facility] ROOM 09 / [facility]S   Surgeon:  Gaynelle Arabian, MD      DISCUSSION: 78 yo never smoker with h/o HTN, Graves disease, uterine cancer s/p hysterectomy and radiation 2015, failed right total knee arthroplasty scheduled for above procedure 01/04/2019 with Dr. Gaynelle Arabian.   Pt last seen by cardiologist, Dr. Minus Breeding, 12/20/18 via telemedicine.  Per OV note, "I need to look at her EKG when she goes to preop.  But pending this result and if it is unremarkable I would not suggest that she would need further cardiac work-up.  Chest pain is very atypical.  It is unchanged from previous." No acute findings on EKG.   Anticipate pt can proceed with planned procedure barring acute status change.  VS: BP (!) 162/60   Pulse 66   Temp 36.8 C (Oral)   Resp 18   Ht 5\' 4"  (1.626 m)   Wt 78 kg   SpO2 98%   BMI 29.52 kg/m   PROVIDERS: [name], MD is PCP   Minus Breeding, MD is Cardiologist  LABS: Labs reviewed: Acceptable for surgery. (all labs ordered are listed, but only abnormal results are displayed)  Labs Reviewed  COMPREHENSIVE METABOLIC PANEL - Abnormal; Notable for the following components:      Result Value   Glucose, Bld 102 (*)    All other components within normal limits  SURGICAL PCR SCREEN  APTT  CBC  PROTIME-INR  TYPE AND SCREEN     IMAGES:   EKG: 12/30/2018 Rate 63 bpm Normal sinus rhythm Left anterior hemiblock Left ventricular hypertrophy ST & T wave abnormality, consider lateral ischemia Since previous tracing 10/31/14 First degree heart block and Premature ventricular complexes NO LONGER PRESENT  CV: Echo 10/24/2015 Study Conclusions  - Left ventricle: The cavity size was normal. Wall thickness was   normal. Systolic function  was normal. The estimated ejection   fraction was in the range of 55% to 60%. Wall motion was normal;   there were no regional wall motion abnormalities. Doppler   parameters are consistent with abnormal left ventricular   relaxation (grade 1 diastolic dysfunction). - Aortic valve: There was mild regurgitation. - Pulmonary arteries: Systolic pressure was mildly increased. PA   peak pressure: 39 mm Hg (S).  Impressions:  - Normal LV systolic function; grade 1 diastolic dysfunction; mild   AI and TR; mildly elevated pulmonary pressure.  Myocardial Perfusion 12/07/2014  LOW RISK Study with Normal EF and small apical thinning artifact.  Hypertensive Response to Exercise with mild-moderately reduced exercise tolerance. Past Medical History:  Diagnosis Date  . Graves disease   . HTN (hypertension)   . Osteoporosis   . Radiation 03/20/14, 03/27/14, 04/05/14, 04/12/14, 04/17/14   bracytherapy to proximal vagina 30 gray  . Scoliosis   . Uterine cancer ([place])    surgery and radiation x 5 treatments-last 9'15    Past Surgical History:  Procedure Laterality Date  . BUNIONECTOMY     left  . CATARACT EXTRACTION, BILATERAL Bilateral    last done 11-21-14  . COLONOSCOPY WITH PROPOFOL N/A 12/25/2014   Procedure: COLONOSCOPY WITH PROPOFOL;  Surgeon: Garlan Fair, MD;  Location: [facility] ENDOSCOPY;  Service: Endoscopy;  Laterality: N/A;  . JOINT REPLACEMENT  RTKA  . ROBOTIC ASSISTED TOTAL HYSTERECTOMY WITH BILATERAL SALPINGO OOPHERECTOMY  01/30/14   with lymph node biopsy  . TOTAL KNEE ARTHROPLASTY     right    MEDICATIONS: . amoxicillin (AMOXIL) 500 MG capsule  . Biotin 5 MG CAPS  . Cholecalciferol (VITAMIN D3) 2000 UNITS capsule  . diphenhydrAMINE (BENADRYL) 25 MG tablet  . furosemide (LASIX) 20 MG tablet  . levothyroxine (SYNTHROID) 88 MCG tablet  . Multiple Vitamins-Minerals (WOMENS MULTIVITAMIN) TABS  . naproxen sodium (ALEVE) 220 MG tablet  . omeprazole (PRILOSEC) 20 MG capsule  .  Omeprazole-Sodium Bicarbonate (ZEGERID) 20-1100 MG CAPS capsule  . potassium chloride (K-DUR) 10 MEQ tablet  . raloxifene (EVISTA) 60 MG tablet   No current facility-administered medications for this encounter.    Maia Plan [facility] Pre-Surgical Testing [phone] 01/02/19 2:25 PM

## 2019-01-03 MED ORDER — BUPIVACAINE LIPOSOME 1.3 % IJ SUSP
20.0000 mL | Freq: Once | INTRAMUSCULAR | Status: DC
  Filled 2019-01-03: qty 20

## 2019-01-03 NOTE — Progress Notes (Signed)
SPOKE W/  Patient      SCREENING SYMPTOMS OF COVID 19:   COUGH--NO  RUNNY NOSE---NO   SORE THROAT---NO  NASAL CONGESTION----NO  SNEEZING----NO  SHORTNESS OF BREATH---NO  DIFFICULTY BREATHING---NO  TEMP >100.0 -----NO  UNEXPLAINED BODY ACHES------NO  CHILLS -------- NO  HEADACHES ---------NO  LOSS OF SMELL/ TASTE --------NO    HAVE YOU OR ANY FAMILY MEMBER TRAVELLED PAST 14 DAYS OUT OF THE   COUNTY---NO STATE----NO COUNTRY----NO  HAVE YOU OR ANY FAMILY MEMBER BEEN EXPOSED TO ANYONE WITH COVID 19? NO

## 2019-01-03 NOTE — Anesthesia Preprocedure Evaluation (Addendum)
Anesthesia Evaluation  Patient identified by MRN, date of birth, ID band Patient awake    Reviewed: Allergy & Precautions, NPO status , Patient's Chart, lab work & pertinent test results  History of Anesthesia Complications Negative for: history of anesthetic complications  Airway Mallampati: II  TM Distance: >3 FB Neck ROM: Full    Dental no notable dental hx.    Pulmonary neg pulmonary ROS,    Pulmonary exam normal        Cardiovascular hypertension, Pt. on medications +CHF  Normal cardiovascular exam  TTE 2017: EF 11-91%, grade 1 diastolic dysfunction, mild AVR, PASP 73mmHg   Neuro/Psych Levoscoliosis    GI/Hepatic Neg liver ROS, GERD  Controlled,  Endo/Other  Hypothyroidism   Renal/GU negative Renal ROS     Musculoskeletal  (+) Arthritis ,   Abdominal   Peds  Hematology negative hematology ROS (+)   Anesthesia Other Findings Day of surgery medications reviewed with the patient.  Reproductive/Obstetrics                            Anesthesia Physical Anesthesia Plan  ASA: II  Anesthesia Plan: Spinal   Post-op Pain Management:  Regional for Post-op pain   Induction:   PONV Risk Score and Plan: 3 and Treatment may vary due to age or medical condition, Ondansetron, Propofol infusion and Midazolam  Airway Management Planned: Natural Airway and Simple Face Mask  Additional Equipment:   Intra-op Plan:   Post-operative Plan:   Informed Consent: I have reviewed the patients History and Physical, chart, labs and discussed the procedure including the risks, benefits and alternatives for the proposed anesthesia with the patient or authorized representative who has indicated his/her understanding and acceptance.     Dental advisory given  Plan Discussed with: CRNA  Anesthesia Plan Comments:        Anesthesia Quick Evaluation

## 2019-01-04 ENCOUNTER — Encounter (HOSPITAL_COMMUNITY): Payer: Self-pay | Admitting: General Practice

## 2019-01-04 ENCOUNTER — Other Ambulatory Visit: Payer: Self-pay

## 2019-01-04 ENCOUNTER — Inpatient Hospital Stay (HOSPITAL_COMMUNITY): Payer: Medicare Other | Admitting: Anesthesiology

## 2019-01-04 ENCOUNTER — Inpatient Hospital Stay (HOSPITAL_COMMUNITY): Payer: Medicare Other | Admitting: Physician Assistant

## 2019-01-04 ENCOUNTER — Inpatient Hospital Stay (HOSPITAL_COMMUNITY)
Admission: RE | Admit: 2019-01-04 | Discharge: 2019-01-06 | DRG: 467 | Disposition: A | Discharge status: Home / Self Care | Payer: Medicare Other | Source: Other Acute Inpatient Hospital | Attending: Orthopedic Surgery | Admitting: Orthopedic Surgery

## 2019-01-04 ENCOUNTER — Encounter (HOSPITAL_COMMUNITY)
Admission: RE | Disposition: A | Discharge status: Home / Self Care | Payer: Self-pay | Source: Other Acute Inpatient Hospital | Attending: Orthopedic Surgery

## 2019-01-04 DIAGNOSIS — Z20828 Contact with and (suspected) exposure to other viral communicable diseases: Secondary | ICD-10-CM | POA: Diagnosis present

## 2019-01-04 DIAGNOSIS — Z8249 Family history of ischemic heart disease and other diseases of the circulatory system: Secondary | ICD-10-CM

## 2019-01-04 DIAGNOSIS — Z90710 Acquired absence of both cervix and uterus: Secondary | ICD-10-CM

## 2019-01-04 DIAGNOSIS — T84018A Broken internal joint prosthesis, other site, initial encounter: Secondary | ICD-10-CM

## 2019-01-04 DIAGNOSIS — K219 Gastro-esophageal reflux disease without esophagitis: Secondary | ICD-10-CM | POA: Diagnosis present

## 2019-01-04 DIAGNOSIS — T84032A Mechanical loosening of internal right knee prosthetic joint, initial encounter: Principal | ICD-10-CM | POA: Diagnosis present

## 2019-01-04 DIAGNOSIS — I5032 Chronic diastolic (congestive) heart failure: Secondary | ICD-10-CM | POA: Diagnosis present

## 2019-01-04 DIAGNOSIS — Z79899 Other long term (current) drug therapy: Secondary | ICD-10-CM

## 2019-01-04 DIAGNOSIS — I110 Hypertensive heart disease with heart failure: Secondary | ICD-10-CM | POA: Diagnosis present

## 2019-01-04 DIAGNOSIS — Y792 Prosthetic and other implants, materials and accessory orthopedic devices associated with adverse incidents: Secondary | ICD-10-CM | POA: Diagnosis present

## 2019-01-04 DIAGNOSIS — Z923 Personal history of irradiation: Secondary | ICD-10-CM | POA: Diagnosis not present

## 2019-01-04 DIAGNOSIS — Z96659 Presence of unspecified artificial knee joint: Secondary | ICD-10-CM

## 2019-01-04 DIAGNOSIS — E039 Hypothyroidism, unspecified: Secondary | ICD-10-CM | POA: Diagnosis present

## 2019-01-04 DIAGNOSIS — Z8542 Personal history of malignant neoplasm of other parts of uterus: Secondary | ICD-10-CM | POA: Diagnosis not present

## 2019-01-04 DIAGNOSIS — M810 Age-related osteoporosis without current pathological fracture: Secondary | ICD-10-CM | POA: Diagnosis present

## 2019-01-04 DIAGNOSIS — Z79890 Hormone replacement therapy (postmenopausal): Secondary | ICD-10-CM | POA: Diagnosis not present

## 2019-01-04 DIAGNOSIS — T84012A Broken internal right knee prosthesis, initial encounter: Secondary | ICD-10-CM

## 2019-01-04 DIAGNOSIS — E663 Overweight: Secondary | ICD-10-CM | POA: Diagnosis present

## 2019-01-04 DIAGNOSIS — Z6829 Body mass index (BMI) 29.0-29.9, adult: Secondary | ICD-10-CM | POA: Diagnosis not present

## 2019-01-04 HISTORY — PX: TOTAL KNEE REVISION: SHX996

## 2019-01-04 LAB — TYPE AND SCREEN
ABO/RH(D): O POS
Antibody Screen: NEGATIVE

## 2019-01-04 LAB — NOVEL CORONAVIRUS, NAA (HOSP ORDER, SEND-OUT TO REF LAB; TAT 18-24 HRS): SARS-CoV-2, NAA: NOT DETECTED

## 2019-01-04 SURGERY — TOTAL KNEE REVISION
Anesthesia: Spinal | Site: Knee | Laterality: Right

## 2019-01-04 MED ORDER — MEPERIDINE HCL 50 MG/ML IJ SOLN
6.2500 mg | INTRAMUSCULAR | Status: DC | PRN
  Administered 2019-01-04: 6.25 mg via INTRAVENOUS

## 2019-01-04 MED ORDER — SODIUM CHLORIDE (PF) 0.9 % IJ SOLN
INTRAMUSCULAR | Status: AC
  Filled 2019-01-04: qty 50

## 2019-01-04 MED ORDER — METHOCARBAMOL 500 MG IVPB - SIMPLE MED
500.0000 mg | Freq: Four times a day (QID) | INTRAVENOUS | Status: DC | PRN
  Filled 2019-01-04: qty 50

## 2019-01-04 MED ORDER — FENTANYL CITRATE (PF) 100 MCG/2ML IJ SOLN
50.0000 ug | INTRAMUSCULAR | Status: DC
  Administered 2019-01-04: 50 ug via INTRAVENOUS
  Filled 2019-01-04: qty 2

## 2019-01-04 MED ORDER — LACTATED RINGERS IV SOLN
INTRAVENOUS | Status: DC
  Administered 2019-01-04 (×2): via INTRAVENOUS

## 2019-01-04 MED ORDER — ACETAMINOPHEN 10 MG/ML IV SOLN: 1000.0000 mg | Freq: Once | INTRAVENOUS | Status: DC | PRN

## 2019-01-04 MED ORDER — RIVAROXABAN 10 MG PO TABS
10.0000 mg | ORAL_TABLET | Freq: Every day | ORAL | Status: DC
  Administered 2019-01-05 – 2019-01-06 (×2): 10 mg via ORAL
  Filled 2019-01-04 (×2): qty 1

## 2019-01-04 MED ORDER — LEVOTHYROXINE SODIUM 88 MCG PO TABS
88.0000 ug | ORAL_TABLET | Freq: Every day | ORAL | Status: DC
  Administered 2019-01-05 – 2019-01-06 (×2): 88 ug via ORAL
  Filled 2019-01-04 (×2): qty 1

## 2019-01-04 MED ORDER — BUPIVACAINE LIPOSOME 1.3 % IJ SUSP
INTRAMUSCULAR | Status: DC | PRN
  Administered 2019-01-04: 20 mL

## 2019-01-04 MED ORDER — PANTOPRAZOLE SODIUM 40 MG PO TBEC
40.0000 mg | DELAYED_RELEASE_TABLET | Freq: Every day | ORAL | Status: DC
  Administered 2019-01-05 – 2019-01-06 (×2): 40 mg via ORAL
  Filled 2019-01-04 (×2): qty 1

## 2019-01-04 MED ORDER — FUROSEMIDE 20 MG PO TABS
20.0000 mg | ORAL_TABLET | Freq: Every morning | ORAL | Status: DC
  Administered 2019-01-05 – 2019-01-06 (×2): 20 mg via ORAL
  Filled 2019-01-04 (×2): qty 1

## 2019-01-04 MED ORDER — CEFAZOLIN SODIUM-DEXTROSE 2-4 GM/100ML-% IV SOLN
2.0000 g | INTRAVENOUS | Status: AC
  Administered 2019-01-04: 2 g via INTRAVENOUS
  Filled 2019-01-04: qty 100

## 2019-01-04 MED ORDER — ONDANSETRON HCL 4 MG/2ML IJ SOLN
INTRAMUSCULAR | Status: AC
  Filled 2019-01-04: qty 2

## 2019-01-04 MED ORDER — DEXAMETHASONE SODIUM PHOSPHATE 10 MG/ML IJ SOLN
INTRAMUSCULAR | Status: AC
  Filled 2019-01-04: qty 1

## 2019-01-04 MED ORDER — PROMETHAZINE HCL 25 MG/ML IJ SOLN: 6.2500 mg | INTRAMUSCULAR | Status: DC | PRN

## 2019-01-04 MED ORDER — MORPHINE SULFATE (PF) 2 MG/ML IV SOLN: 1.0000 mg | INTRAVENOUS | Status: DC | PRN

## 2019-01-04 MED ORDER — PHENOL 1.4 % MT LIQD: 1.0000 | OROMUCOSAL | Status: DC | PRN

## 2019-01-04 MED ORDER — DEXAMETHASONE SODIUM PHOSPHATE 10 MG/ML IJ SOLN
8.0000 mg | Freq: Once | INTRAMUSCULAR | Status: AC
  Administered 2019-01-04: 10 mg via INTRAVENOUS

## 2019-01-04 MED ORDER — MIDAZOLAM HCL 2 MG/2ML IJ SOLN
1.0000 mg | INTRAMUSCULAR | Status: DC
  Administered 2019-01-04: 1 mg via INTRAVENOUS
  Filled 2019-01-04: qty 2

## 2019-01-04 MED ORDER — PROPOFOL 10 MG/ML IV BOLUS
INTRAVENOUS | Status: DC | PRN
  Administered 2019-01-04 (×2): 20 mg via INTRAVENOUS

## 2019-01-04 MED ORDER — ONDANSETRON HCL 4 MG PO TABS
4.0000 mg | ORAL_TABLET | Freq: Four times a day (QID) | ORAL | Status: DC | PRN
  Administered 2019-01-05: 4 mg via ORAL
  Filled 2019-01-04: qty 1

## 2019-01-04 MED ORDER — METOCLOPRAMIDE HCL 5 MG/ML IJ SOLN: 5.0000 mg | Freq: Three times a day (TID) | INTRAMUSCULAR | Status: DC | PRN

## 2019-01-04 MED ORDER — ACETAMINOPHEN 500 MG PO TABS
1000.0000 mg | ORAL_TABLET | Freq: Four times a day (QID) | ORAL | Status: AC
  Administered 2019-01-04 – 2019-01-05 (×3): 1000 mg via ORAL
  Filled 2019-01-04 (×4): qty 2

## 2019-01-04 MED ORDER — SODIUM CHLORIDE 0.9 % IV SOLN
INTRAVENOUS | Status: DC
  Administered 2019-01-04 – 2019-01-05 (×2): via INTRAVENOUS

## 2019-01-04 MED ORDER — METHOCARBAMOL 500 MG PO TABS
500.0000 mg | ORAL_TABLET | Freq: Four times a day (QID) | ORAL | Status: DC | PRN
  Administered 2019-01-05 – 2019-01-06 (×4): 500 mg via ORAL
  Filled 2019-01-04 (×4): qty 1

## 2019-01-04 MED ORDER — LIDOCAINE 2% (20 MG/ML) 5 ML SYRINGE
INTRAMUSCULAR | Status: AC
  Filled 2019-01-04: qty 5

## 2019-01-04 MED ORDER — MEPERIDINE HCL 50 MG/ML IJ SOLN
INTRAMUSCULAR | Status: AC
  Filled 2019-01-04: qty 1

## 2019-01-04 MED ORDER — SODIUM CHLORIDE (PF) 0.9 % IJ SOLN
INTRAMUSCULAR | Status: DC | PRN
  Administered 2019-01-04: 60 mL

## 2019-01-04 MED ORDER — BUPIVACAINE-EPINEPHRINE (PF) 0.5% -1:200000 IJ SOLN
INTRAMUSCULAR | Status: DC | PRN
  Administered 2019-01-04: 15 mL via PERINEURAL

## 2019-01-04 MED ORDER — OXYCODONE HCL 5 MG/5ML PO SOLN: 5.0000 mg | Freq: Once | ORAL | Status: DC | PRN

## 2019-01-04 MED ORDER — CHLORHEXIDINE GLUCONATE 4 % EX LIQD: 60.0000 mL | Freq: Once | CUTANEOUS | Status: DC

## 2019-01-04 MED ORDER — FENTANYL CITRATE (PF) 100 MCG/2ML IJ SOLN
25.0000 ug | INTRAMUSCULAR | Status: DC | PRN
  Administered 2019-01-04 (×2): 50 ug via INTRAVENOUS

## 2019-01-04 MED ORDER — DOCUSATE SODIUM 100 MG PO CAPS
100.0000 mg | ORAL_CAPSULE | Freq: Two times a day (BID) | ORAL | Status: DC
  Administered 2019-01-04 – 2019-01-06 (×4): 100 mg via ORAL
  Filled 2019-01-04 (×4): qty 1

## 2019-01-04 MED ORDER — 0.9 % SODIUM CHLORIDE (POUR BTL) OPTIME
TOPICAL | Status: DC | PRN
  Administered 2019-01-04: 1000 mL

## 2019-01-04 MED ORDER — STERILE WATER FOR IRRIGATION IR SOLN
Status: DC | PRN
  Administered 2019-01-04: 2000 mL

## 2019-01-04 MED ORDER — CEFAZOLIN SODIUM-DEXTROSE 2-4 GM/100ML-% IV SOLN
2.0000 g | Freq: Four times a day (QID) | INTRAVENOUS | Status: AC
  Administered 2019-01-04 (×2): 2 g via INTRAVENOUS
  Filled 2019-01-04 (×2): qty 100

## 2019-01-04 MED ORDER — PROPOFOL 500 MG/50ML IV EMUL
INTRAVENOUS | Status: DC | PRN
  Administered 2019-01-04: 75 ug/kg/min via INTRAVENOUS

## 2019-01-04 MED ORDER — TRAMADOL HCL 50 MG PO TABS
50.0000 mg | ORAL_TABLET | Freq: Four times a day (QID) | ORAL | Status: DC | PRN
  Administered 2019-01-05: 50 mg via ORAL
  Administered 2019-01-05 (×2): 100 mg via ORAL
  Administered 2019-01-05: 50 mg via ORAL
  Administered 2019-01-06: 100 mg via ORAL
  Filled 2019-01-04: qty 2
  Filled 2019-01-04: qty 1
  Filled 2019-01-04 (×2): qty 2
  Filled 2019-01-04: qty 1
  Filled 2019-01-04: qty 2

## 2019-01-04 MED ORDER — PROPOFOL 10 MG/ML IV BOLUS
INTRAVENOUS | Status: AC
  Filled 2019-01-04: qty 60

## 2019-01-04 MED ORDER — TRANEXAMIC ACID-NACL 1000-0.7 MG/100ML-% IV SOLN
1000.0000 mg | Freq: Once | INTRAVENOUS | Status: AC
  Administered 2019-01-04: 16:00:00 1000 mg via INTRAVENOUS
  Filled 2019-01-04: qty 100

## 2019-01-04 MED ORDER — OXYCODONE HCL 5 MG PO TABS
5.0000 mg | ORAL_TABLET | ORAL | Status: DC | PRN
  Administered 2019-01-04 – 2019-01-05 (×2): 5 mg via ORAL
  Filled 2019-01-04 (×3): qty 1

## 2019-01-04 MED ORDER — POLYETHYLENE GLYCOL 3350 17 G PO PACK: 17.0000 g | PACK | Freq: Every day | ORAL | Status: DC | PRN

## 2019-01-04 MED ORDER — SODIUM CHLORIDE (PF) 0.9 % IJ SOLN
INTRAMUSCULAR | Status: AC
  Filled 2019-01-04: qty 10

## 2019-01-04 MED ORDER — LIDOCAINE HCL (CARDIAC) PF 100 MG/5ML IV SOSY
PREFILLED_SYRINGE | INTRAVENOUS | Status: DC | PRN
  Administered 2019-01-04: 80 mg via INTRAVENOUS

## 2019-01-04 MED ORDER — DIPHENHYDRAMINE HCL 12.5 MG/5ML PO ELIX: 12.5000 mg | ORAL_SOLUTION | ORAL | Status: DC | PRN

## 2019-01-04 MED ORDER — METOCLOPRAMIDE HCL 5 MG PO TABS: 5.0000 mg | ORAL_TABLET | Freq: Three times a day (TID) | ORAL | Status: DC | PRN

## 2019-01-04 MED ORDER — SODIUM CHLORIDE 0.9 % IR SOLN
Status: DC | PRN
  Administered 2019-01-04: 1000 mL

## 2019-01-04 MED ORDER — FENTANYL CITRATE (PF) 100 MCG/2ML IJ SOLN
INTRAMUSCULAR | Status: AC
  Filled 2019-01-04: qty 2

## 2019-01-04 MED ORDER — ONDANSETRON HCL 4 MG/2ML IJ SOLN
INTRAMUSCULAR | Status: DC | PRN
  Administered 2019-01-04: 4 mg via INTRAVENOUS

## 2019-01-04 MED ORDER — OXYCODONE HCL 5 MG PO TABS: 5.0000 mg | ORAL_TABLET | Freq: Once | ORAL | Status: DC | PRN

## 2019-01-04 MED ORDER — ONDANSETRON HCL 4 MG/2ML IJ SOLN
4.0000 mg | Freq: Four times a day (QID) | INTRAMUSCULAR | Status: DC | PRN
  Administered 2019-01-04: 4 mg via INTRAVENOUS
  Filled 2019-01-04: qty 2

## 2019-01-04 MED ORDER — TRANEXAMIC ACID-NACL 1000-0.7 MG/100ML-% IV SOLN
1000.0000 mg | INTRAVENOUS | Status: AC
  Administered 2019-01-04: 1000 mg via INTRAVENOUS
  Filled 2019-01-04: qty 100

## 2019-01-04 MED ORDER — PROPOFOL 10 MG/ML IV BOLUS
INTRAVENOUS | Status: AC
  Filled 2019-01-04: qty 20

## 2019-01-04 MED ORDER — FLEET ENEMA 7-19 GM/118ML RE ENEM: 1.0000 | ENEMA | Freq: Once | RECTAL | Status: DC | PRN

## 2019-01-04 MED ORDER — BUPIVACAINE IN DEXTROSE 0.75-8.25 % IT SOLN
INTRATHECAL | Status: DC | PRN
  Administered 2019-01-04: 1.6 mL via INTRATHECAL

## 2019-01-04 MED ORDER — MENTHOL 3 MG MT LOZG: 1.0000 | LOZENGE | OROMUCOSAL | Status: DC | PRN

## 2019-01-04 MED ORDER — ACETAMINOPHEN 10 MG/ML IV SOLN
1000.0000 mg | Freq: Four times a day (QID) | INTRAVENOUS | Status: DC
  Administered 2019-01-04: 1000 mg via INTRAVENOUS
  Filled 2019-01-04: qty 100

## 2019-01-04 MED ORDER — DEXAMETHASONE SODIUM PHOSPHATE 10 MG/ML IJ SOLN
10.0000 mg | Freq: Once | INTRAMUSCULAR | Status: DC
  Filled 2019-01-04: qty 1

## 2019-01-04 MED ORDER — RALOXIFENE HCL 60 MG PO TABS
60.0000 mg | ORAL_TABLET | Freq: Every day | ORAL | Status: DC
  Administered 2019-01-05 – 2019-01-06 (×2): 60 mg via ORAL
  Filled 2019-01-04 (×2): qty 1

## 2019-01-04 MED ORDER — BISACODYL 10 MG RE SUPP: 10.0000 mg | Freq: Every day | RECTAL | Status: DC | PRN

## 2019-01-04 SURGICAL SUPPLY — 67 items
ADAPTER BOLT FEMORAL +2/-2 (Knees) ×2 IMPLANT
BAG DECANTER FOR FLEXI CONT (MISCELLANEOUS) IMPLANT
BAG ZIPLOCK 12X15 (MISCELLANEOUS) IMPLANT
BANDAGE ACE 6X5 VEL STRL LF (GAUZE/BANDAGES/DRESSINGS) ×4 IMPLANT
BLADE SAG 18X100X1.27 (BLADE) ×2 IMPLANT
BLADE SAW SGTL 11.0X1.19X90.0M (BLADE) ×2 IMPLANT
BLADE SURG SZ10 CARB STEEL (BLADE) ×4 IMPLANT
BONE CEMENT GENTAMICIN (Cement) ×6 IMPLANT
CEMENT BONE GENTAMICIN 40 (Cement) ×3 IMPLANT
CEMENT RESTRICTOR DEPUY SZ 4 (Cement) ×2 IMPLANT
CLOTH BEACON ORANGE TIMEOUT ST (SAFETY) IMPLANT
COMP FEM CEM RT SZ3 (Orthopedic Implant) ×2 IMPLANT
COMPONENT FEM CEM RT SZ3 (Orthopedic Implant) ×1 IMPLANT
COVER SURGICAL LIGHT HANDLE (MISCELLANEOUS) ×2 IMPLANT
COVER WAND RF STERILE (DRAPES) IMPLANT
CUFF TOURN SGL QUICK 34 (TOURNIQUET CUFF) ×1
CUFF TRNQT CYL 34X4.125X (TOURNIQUET CUFF) ×1 IMPLANT
DECANTER SPIKE VIAL GLASS SM (MISCELLANEOUS) ×2 IMPLANT
DISTAL WEDGE PFC 4MM RIGHT (Knees) ×2 IMPLANT
DRAPE U-SHAPE 47X51 STRL (DRAPES) ×2 IMPLANT
DRSG ADAPTIC 3X8 NADH LF (GAUZE/BANDAGES/DRESSINGS) ×2 IMPLANT
DRSG PAD ABDOMINAL 8X10 ST (GAUZE/BANDAGES/DRESSINGS) ×2 IMPLANT
DURAPREP 26ML APPLICATOR (WOUND CARE) ×2 IMPLANT
ELECT REM PT RETURN 15FT ADLT (MISCELLANEOUS) ×2 IMPLANT
EVACUATOR 1/8 PVC DRAIN (DRAIN) ×2 IMPLANT
FEMORAL ADAPTER (Orthopedic Implant) ×2 IMPLANT
GAUZE SPONGE 4X4 12PLY STRL (GAUZE/BANDAGES/DRESSINGS) ×2 IMPLANT
GLOVE BIO SURGEON STRL SZ7 (GLOVE) ×4 IMPLANT
GLOVE BIO SURGEON STRL SZ8 (GLOVE) ×2 IMPLANT
GLOVE BIOGEL PI IND STRL 7.0 (GLOVE) ×2 IMPLANT
GLOVE BIOGEL PI IND STRL 8 (GLOVE) ×1 IMPLANT
GLOVE BIOGEL PI INDICATOR 7.0 (GLOVE) ×2
GLOVE BIOGEL PI INDICATOR 8 (GLOVE) ×1
GOWN STRL REUS W/TWL LRG LVL3 (GOWN DISPOSABLE) ×6 IMPLANT
HANDPIECE INTERPULSE COAX TIP (DISPOSABLE) ×1
HOLDER FOLEY CATH W/STRAP (MISCELLANEOUS) ×2 IMPLANT
IMMOBILIZER KNEE 20 (SOFTGOODS) ×4
IMMOBILIZER KNEE 20 THIGH 36 (SOFTGOODS) ×2 IMPLANT
INSERT TIBIAL TC3 15.0 (Knees) ×2 IMPLANT
KIT TURNOVER KIT A (KITS) IMPLANT
MANIFOLD NEPTUNE II (INSTRUMENTS) ×2 IMPLANT
NS IRRIG 1000ML POUR BTL (IV SOLUTION) ×2 IMPLANT
PACK TOTAL KNEE CUSTOM (KITS) ×2 IMPLANT
PADDING CAST COTTON 6X4 STRL (CAST SUPPLIES) ×4 IMPLANT
PIN STEINMAN FIXATION KNEE (PIN) ×2 IMPLANT
PROTECTOR NERVE ULNAR (MISCELLANEOUS) ×2 IMPLANT
SET HNDPC FAN SPRY TIP SCT (DISPOSABLE) ×1 IMPLANT
STEM FLUTED UNIV REV 75X20 (Stem) ×2 IMPLANT
STEM TIBIA PFC 13X30MM (Stem) ×2 IMPLANT
STRIP CLOSURE SKIN 1/2X4 (GAUZE/BANDAGES/DRESSINGS) ×4 IMPLANT
SUT MNCRL AB 4-0 PS2 18 (SUTURE) ×2 IMPLANT
SUT STRATAFIX 0 PDS 27 VIOLET (SUTURE) ×2
SUT VIC AB 2-0 CT1 27 (SUTURE) ×3
SUT VIC AB 2-0 CT1 TAPERPNT 27 (SUTURE) ×3 IMPLANT
SUTURE STRATFX 0 PDS 27 VIOLET (SUTURE) ×1 IMPLANT
SWAB COLLECTION DEVICE MRSA (MISCELLANEOUS) IMPLANT
SWAB CULTURE ESWAB REG 1ML (MISCELLANEOUS) IMPLANT
SYR 50ML LL SCALE MARK (SYRINGE) ×4 IMPLANT
TOWER CARTRIDGE SMART MIX (DISPOSABLE) ×2 IMPLANT
TRAY FOLEY MTR SLVR 16FR STAT (SET/KITS/TRAYS/PACK) IMPLANT
TRAY REVISION SZ 2.5 (Knees) ×2 IMPLANT
TRAY SLEEVE CEM ML (Knees) ×2 IMPLANT
TUBE KAMVAC SUCTION (TUBING) IMPLANT
WATER STERILE IRR 1000ML POUR (IV SOLUTION) ×2 IMPLANT
WEDGE DISTAL PFC RIGHT 4MM (Knees) ×1 IMPLANT
WEDGE STEP SZ.5 5MM (Knees) ×4 IMPLANT
WRAP KNEE MAXI GEL POST OP (GAUZE/BANDAGES/DRESSINGS) ×2 IMPLANT

## 2019-01-04 NOTE — Interval H&P Note (Signed)
History and Physical Interval Note:  01/04/2019 8:15 AM  Terri Ortega  has presented today for surgery, with the diagnosis of failed right total knee arthroplasty.  The various methods of treatment have been discussed with the patient and family. After consideration of risks, benefits and other options for treatment, the patient has consented to  Procedure(s) with comments: TOTAL KNEE REVISION (Right) - 129min as a surgical intervention.  The patient's history has been reviewed, patient examined, no change in status, stable for surgery.  I have reviewed the patient's chart and labs.  Questions were answered to the patient's satisfaction.     Pilar Plate Josean Lycan

## 2019-01-04 NOTE — Progress Notes (Signed)
Assisted Dr. Doloris Hall with Right Knee adductor canal block. Side rails up, monitors on throughout procedure. See vital signs in flow sheet. Tolerated Procedure well.

## 2019-01-04 NOTE — Evaluation (Signed)
Physical Therapy Evaluation Patient Details Name: Terri Ortega MRN: [id number] DOB: 1940-12-02 Today's Date: 01/04/2019   History of Present Illness  R TKA revision; PMH of HTN, endometrial cancer  Clinical Impression  Pt is s/p TKA revision resulting in the deficits listed below (see PT Problem List). Supine to sit with mod assist, +2 mod assist sit to stand, pt took a few lateral steps with RW at edge of bed. Activity tolerance limited by nausea and lethargy, pt assisted back to supine.  Pt will benefit from skilled PT to increase their independence and safety with mobility to allow discharge to the venue listed below.      Follow Up Recommendations Follow surgeon's recommendation for DC plan and follow-up therapies    Equipment Recommendations  Rolling walker with 5" wheels;3in1 (PT)    Recommendations for Other Services       Precautions / Restrictions Precautions Precautions: Fall;Knee Restrictions Weight Bearing Restrictions: No Other Position/Activity Restrictions: WBAT      Mobility  Bed Mobility Overal bed mobility: Needs Assistance Bed Mobility: Supine to Sit;Sit to Supine     Supine to sit: Mod assist Sit to supine: +2 for physical assistance;Mod assist   General bed mobility comments: assist to advance/support RLE and to raise trunk; assist for trunk and LEs into bed  Transfers Overall transfer level: Needs assistance Equipment used: Rolling walker (2 wheeled) Transfers: Sit to/from Stand Sit to Stand: Mod assist;+2 physical assistance;+2 safety/equipment;From elevated surface         General transfer comment: VCs hand placement, mod A to rise/steady, pt took several sidesteps towards head of bed with RW; pt nauseous and lethargic, so assisted back to bed. BP 181/95, HR 87, SaO2 92%, RN aware.  Ambulation/Gait                Stairs            Wheelchair Mobility    Modified Rankin (Stroke Patients Only)       Balance Overall  balance assessment: Needs assistance   Sitting balance-Leahy Scale: Fair     Standing balance support: Bilateral upper extremity supported Standing balance-Leahy Scale: Poor                               Pertinent Vitals/Pain Pain Assessment: 0-10 Pain Score: 6  Pain Location: R knee Pain Descriptors / Indicators: Sore Pain Intervention(s): Limited activity within patient's tolerance;Monitored during session;Ice applied(pt was not premedicated 2* lethargy)    Home Living Family/patient expects to be discharged to:: (P) Private residence Living Arrangements: (P) Spouse/significant other Available Help at Discharge: (P) Family;Available 24 hours/day   Home Access: (P) Stairs to enter Entrance Stairs-Rails: (P) Right;Left;Can reach both Entrance Stairs-Number of Steps: (P) 4 Home Layout: (P) One level Home Equipment: (P) Cane - single point      Prior Function Level of Independence: (P) Independent               Hand Dominance        Extremity/Trunk Assessment   Upper Extremity Assessment Upper Extremity Assessment: Overall WFL for tasks assessed    Lower Extremity Assessment Lower Extremity Assessment: RLE deficits/detail RLE Deficits / Details: SLR -3/5, knee AAROM 5-35* limited by pain RLE Sensation: WNL    Cervical / Trunk Assessment Cervical / Trunk Assessment: Normal  Communication   Communication: (P) No difficulties  Cognition Arousal/Alertness: Lethargic;Suspect due to medications Behavior During Therapy: [facility] for  tasks assessed/performed Overall Cognitive Status: Within Functional Limits for tasks assessed                                        General Comments      Exercises Total Joint Exercises Ankle Circles/Pumps: AROM;10 reps;Supine Heel Slides: AAROM;Right;5 reps;Supine Straight Leg Raises: AAROM;Right;5 reps;Supine   Assessment/Plan    PT Assessment Patient needs continued PT services  PT Problem List  Decreased range of motion;Decreased activity tolerance;Decreased mobility;Pain       PT Treatment Interventions Gait training;Functional mobility training;Therapeutic activities;Therapeutic exercise;Patient/family education    PT Goals (Current goals can be found in the Care Plan section)  Acute Rehab PT Goals Patient Stated Goal: to be able to walk, play with grandkid PT Goal Formulation: With patient Time For Goal Achievement: 01/11/19 Potential to Achieve Goals: Good    Frequency 7X/week   Barriers to discharge        Co-evaluation               AM-PAC PT "6 Clicks" Mobility  Outcome Measure Help needed turning from your back to your side while in a flat bed without using bedrails?: A Lot Help needed moving from lying on your back to sitting on the side of a flat bed without using bedrails?: A Lot Help needed moving to and from a bed to a chair (including a wheelchair)?: A Lot Help needed standing up from a chair using your arms (e.g., wheelchair or bedside chair)?: A Lot Help needed to walk in hospital room?: Total Help needed climbing 3-5 steps with a railing? : Total 6 Click Score: 10    End of Session Equipment Utilized During Treatment: Gait belt Activity Tolerance: Patient tolerated treatment well Patient left: in bed;with call bell/phone within reach;with bed alarm set Nurse Communication: Mobility status PT Visit Diagnosis: Difficulty in walking, not elsewhere classified (R26.2);Pain;Muscle weakness (generalized) (M62.81) Pain - Right/Left: Right Pain - part of body: Knee    Time: 4818-5631 PT Time Calculation (min) (ACUTE ONLY): 31 min   Charges:   PT Evaluation $PT Eval Moderate Complexity: 1 Mod PT Treatments $Therapeutic Activity: 8-22 mins        Blondell Reveal Kistler PT 01/04/2019  Acute Rehabilitation Services Pager [phone] Office [phone]

## 2019-01-04 NOTE — Progress Notes (Signed)
Dr Lessie Dings aware of SBP in pacu in the 160"s and higher.  No orders received.  May tx to room

## 2019-01-04 NOTE — Anesthesia Procedure Notes (Signed)
Spinal  Patient location during procedure: OR End time: 01/04/2019 9:44 AM Staffing Resident/CRNA: Caryl Pina T, CRNA Performed: resident/CRNA  Preanesthetic Checklist Completed: patient identified, site marked, surgical consent, pre-op evaluation, timeout performed, IV checked, risks and benefits discussed and monitors and equipment checked Spinal Block Patient position: sitting Prep: DuraPrep Patient monitoring: heart rate, cardiac monitor, continuous pulse ox and blood pressure Approach: midline Location: L4-5 Injection technique: single-shot Needle Needle type: [place]  Needle gauge: 22 G Needle length: 9 cm Assessment Sensory level: T6 Additional Notes Expiration date of kit checked and confirmed. Patient tolerated procedure well, without complications.

## 2019-01-04 NOTE — Transfer of Care (Signed)
Immediate Anesthesia Transfer of Care Note  Patient: Terri Ortega  Procedure(s) Performed: TOTAL KNEE REVISION (Right Knee)  Patient Location: PACU  Anesthesia Type:Spinal  Level of Consciousness: awake, alert , oriented and patient cooperative  Airway & Oxygen Therapy: Patient Spontanous Breathing and Patient connected to face mask oxygen  Post-op Assessment: Report given to RN, Post -op Vital signs reviewed and stable and Patient moving all extremities  Post vital signs: Reviewed and stable  Last Vitals:  Vitals Value Taken Time  BP    Temp    Pulse    Resp    SpO2      Last Pain:  Vitals:   01/04/19 0804  TempSrc:   PainSc: 0-No pain         Complications: No apparent anesthesia complications

## 2019-01-04 NOTE — Anesthesia Postprocedure Evaluation (Signed)
Anesthesia Post Note  Patient: Terri Ortega  Procedure(s) Performed: TOTAL KNEE REVISION (Right Knee)     Patient location during evaluation: PACU Anesthesia Type: Spinal Level of consciousness: awake and alert Pain management: pain level controlled Vital Signs Assessment: post-procedure vital signs reviewed and stable Respiratory status: spontaneous breathing, nonlabored ventilation and respiratory function stable Cardiovascular status: blood pressure returned to baseline and stable Postop Assessment: no apparent nausea or vomiting and spinal receding Anesthetic complications: no    Last Vitals:  Vitals:   01/04/19 1330 01/04/19 1345  BP: (!) 168/90 (!) 166/84  Pulse: 75 78  Resp: (!) 25 13  Temp:    SpO2: 100% 100%    Last Pain:  Vitals:   01/04/19 1300  TempSrc:   PainSc: [name]

## 2019-01-04 NOTE — Brief Op Note (Signed)
01/04/2019  11:19 AM  PATIENT:  Terri Ortega  78 y.o. female  PRE-OPERATIVE DIAGNOSIS:  failed right total knee arthroplasty  POST-OPERATIVE DIAGNOSIS:  failed right total knee arthroplasty  PROCEDURE:  Procedure(s) with comments: TOTAL KNEE REVISION (Right) - 150min with block  SURGEON:  Surgeon(s) and Role:    Gaynelle Arabian, MD - Primary  PHYSICIAN ASSISTANT:   ASSISTANTS: Theresa Duty, PA-C   ANESTHESIA:   Adductor canal block and Spinal  EBL:  100 mL   BLOOD ADMINISTERED:none  DRAINS: (Medium) Hemovact drain(s) in the right knee with  Suction Open   LOCAL MEDICATIONS USED:  OTHER Exparel  COUNTS:  YES  TOURNIQUET:   Total Tourniquet Time Documented: Thigh (Right) - 64 minutes Total: Thigh (Right) - 64 minutes   DICTATION: .Other Dictation: Dictation Number [id number]  PLAN OF CARE: Admit to inpatient   PATIENT DISPOSITION:  PACU - hemodynamically stable.

## 2019-01-04 NOTE — Anesthesia Procedure Notes (Signed)
Anesthesia Regional Block: Adductor canal block   Pre-Anesthetic Checklist: ,, timeout performed, Correct Patient, Correct Site, Correct Laterality, Correct Procedure, Correct Position, site marked, Risks and benefits discussed, pre-op evaluation,  At surgeon's request and post-op pain management  Laterality: Right  Prep: Maximum Sterile Barrier Precautions used, chloraprep       Needles:  Injection technique: Single-shot  Needle Type: Echogenic Stimulator Needle     Needle Length: 9cm  Needle Gauge: 22     Additional Needles:   Procedures:,,,, ultrasound used (permanent image in chart),,,,  Narrative:  Start time: 01/04/2019 9:09 AM End time: 01/04/2019 9:11 AM Injection made incrementally with aspirations every 5 mL.  Performed by: Personally  Anesthesiologist: Brennan Bailey, MD  Additional Notes: Risks, benefits, and alternative discussed. Patient gave consent for procedure. Patient prepped and draped in sterile fashion. Sedation administered, patient remains easily responsive to voice. Relevant anatomy identified with ultrasound guidance. Local anesthetic given in 5cc increments with no signs or symptoms of intravascular injection. No pain or paraesthesias with injection. Patient monitored throughout procedure with signs of LAST or immediate complications. Tolerated well. Ultrasound image placed in chart.  Tawny Asal, MD

## 2019-01-04 NOTE — Discharge Instructions (Addendum)
Dr. Gaynelle Arabian Total Joint Specialist [facility] [address], [place], [place] [address] [phone]  TOTAL KNEE REVISION POSTOPERATIVE DIRECTIONS  Knee Rehabilitation, Guidelines Following Surgery  Results after knee surgery are often greatly improved when you follow the exercise, range of motion and muscle strengthening exercises prescribed by your doctor. Safety measures are also important to protect the knee from further injury. Any time any of these exercises cause you to have increased pain or swelling in your knee joint, decrease the amount until you are comfortable again and slowly increase them. If you have problems or questions, call your caregiver or physical therapist for advice.   HOME CARE INSTRUCTIONS   Remove items at home which could result in a fall. This includes throw rugs or furniture in walking pathways.   ICE to the affected knee every three hours for 30 minutes at a time and then as needed for pain and swelling.  Continue to use ice on the knee for pain and swelling from surgery. You may notice swelling that will progress down to the foot and ankle.  This is normal after surgery.  Elevate the leg when you are not up walking on it.    Continue to use the breathing machine which will help keep your temperature down.  It is common for your temperature to cycle up and down following surgery, especially at night when you are not up moving around and exerting yourself.  The breathing machine keeps your lungs expanded and your temperature down.  Do not place pillow under knee, focus on keeping the knee straight while resting  DIET You may resume your previous home diet once your are discharged from the hospital.  DRESSING / WOUND CARE / SHOWERING You may change your dressing 3-5 days after surgery.  Then change the dressing every day with sterile gauze.  Please use good hand washing techniques before changing the dressing.  Do not use any lotions or  creams on the incision until instructed by your surgeon. You may start showering once you are discharged home but do not submerge the incision under water. Just pat the incision dry and apply a dry gauze dressing on daily. Change the surgical dressing daily and reapply a dry dressing each time.  ACTIVITY Walk with your walker as instructed. Use walker as long as suggested by your caregivers. Avoid periods of inactivity such as sitting longer than an hour when not asleep. This helps prevent blood clots.  You may resume a sexual relationship in one month or when given the OK by your doctor.  You may return to work once you are cleared by your doctor.  Do not drive a car for 6 weeks or until released by you surgeon.  Do not drive while taking narcotics.  WEIGHT BEARING Weight bearing as tolerated with assist device (walker, cane, etc) as directed, use it as long as suggested by your surgeon or therapist, typically at least 4-6 weeks.  POSTOPERATIVE CONSTIPATION PROTOCOL Constipation - defined medically as fewer than three stools per week and severe constipation as less than one stool per week.  One of the most common issues patients have following surgery is constipation.  Even if you have a regular bowel pattern at home, your normal regimen is likely to be disrupted due to multiple reasons following surgery.  Combination of anesthesia, postoperative narcotics, change in appetite and fluid intake all can affect your bowels.  In order to avoid complications following surgery, here are some  recommendations in order to help you during your recovery period. ° °Colace (docusate) - Pick up an over-the-counter form of Colace or another stool softener and take twice a day as long as you are requiring postoperative pain medications.  Take with a full glass of water daily.  If you experience loose stools or diarrhea, hold the colace until you stool forms back up.  If your symptoms do not get better within 1  week or if they get worse, check with your doctor. ° °Dulcolax (bisacodyl) - Pick up over-the-counter and take as directed by the product packaging as needed to assist with the movement of your bowels.  Take with a full glass of water.  Use this product as needed if not relieved by Colace only.  ° °MiraLax (polyethylene glycol) - Pick up over-the-counter to have on hand.  MiraLax is a solution that will increase the amount of water in your bowels to assist with bowel movements.  Take as directed and can mix with a glass of water, juice, soda, coffee, or tea.  Take if you go more than two days without a movement. °Do not use MiraLax more than once per day. Call your doctor if you are still constipated or irregular after using this medication for 7 days in a row. ° °If you continue to have problems with postoperative constipation, please contact the office for further assistance and recommendations.  If you experience "the worst abdominal pain ever" or develop nausea or vomiting, please contact the office immediatly for further recommendations for treatment. ° °ITCHING °If you experience itching with your medications, try taking only a single pain pill, or even half a pain pill at a time.  You can also use Benadryl over the counter for itching or also to help with sleep.  ° °TED HOSE STOCKINGS °Wear the elastic stockings on both legs for three weeks following surgery during the day but you may remove then at night for sleeping. ° °MEDICATIONS °See your medication summary on the “After Visit Summary” that the nursing staff will review with you prior to discharge.  You may have some home medications which will be placed on hold until you complete the course of blood thinner medication.  It is important for you to complete the blood thinner medication as prescribed by your surgeon.  Continue your approved medications as instructed at time of discharge. ° °PRECAUTIONS °If you experience chest pain or shortness of breath -  call 911 immediately for transfer to the hospital emergency department.  °If you develop a fever greater that 101 F, purulent drainage from wound, increased redness or drainage from wound, foul odor from the wound/dressing, or calf pain - CONTACT YOUR SURGEON.   °                                                °FOLLOW-UP APPOINTMENTS °Make sure you keep all of your appointments after your operation with your surgeon and caregivers. You should call the office at the above phone number and make an appointment for approximately two weeks after the date of your surgery or on the date instructed by your surgeon outlined in the "After Visit Summary". ° °RANGE OF MOTION AND STRENGTHENING EXERCISES  °Rehabilitation of the knee is important following a knee injury or an operation. After just a few days of immobilization, the muscles of   the thigh which control the knee become weakened and shrink (atrophy). Knee exercises are designed to build up the tone and strength of the thigh muscles and to improve knee motion. Often times heat used for twenty to thirty minutes before working out will loosen up your tissues and help with improving the range of motion but do not use heat for the first two weeks following surgery. These exercises can be done on a training (exercise) mat, on the floor, on a table or on a bed. Use what ever works the best and is most comfortable for you Knee exercises include:   Leg Lifts - While your knee is still immobilized in a splint or cast, you can do straight leg raises. Lift the leg to 60 degrees, hold for 3 sec, and slowly lower the leg. Repeat 10-20 times 2-3 times daily. Perform this exercise against resistance later as your knee gets better.   Quad and Hamstring Sets - Tighten up the muscle on the front of the thigh (Quad) and hold for 5-10 sec. Repeat this 10-20 times hourly. Hamstring sets are done by pushing the foot backward against an object and holding for 5-10 sec. Repeat as with quad  sets.   Leg Slides: Lying on your back, slowly slide your foot toward your buttocks, bending your knee up off the floor (only go as far as is comfortable). Then slowly slide your foot back down until your leg is flat on the floor again.  Angel Wings: Lying on your back spread your legs to the side as far apart as you can without causing discomfort.  A rehabilitation program following serious knee injuries can speed recovery and prevent re-injury in the future due to weakened muscles. Contact your doctor or a physical therapist for more information on knee rehabilitation.   IF YOU ARE TRANSFERRED TO A SKILLED REHAB FACILITY If the patient is transferred to a skilled rehab facility following release from the hospital, a list of the current medications will be sent to the facility for the patient to continue.  When discharged from the skilled rehab facility, please have the facility set up the patient's [place] prior to being released. Also, the skilled facility will be responsible for providing the patient with their medications at time of release from the facility to include their pain medication, the muscle relaxants, and their blood thinner medication. If the patient is still at the rehab facility at time of the two week follow up appointment, the skilled rehab facility will also need to assist the patient in arranging follow up appointment in our office and any transportation needs.  MAKE SURE YOU:   Understand these instructions.   Get help right away if you are not doing well or get worse.    Pick up stool softner and laxative for home use following surgery while on pain medications. Do not submerge incision under water. Please use good hand washing techniques while changing dressing each day. May shower starting three days after surgery. Please use a clean towel to pat the incision dry following showers. Continue to use ice for pain and swelling after surgery. Do not  use any lotions or creams on the incision until instructed by your surgeon.   Information on my medicine - XARELTO (Rivaroxaban)  Why was Xarelto prescribed for you? Xarelto was prescribed for you to reduce the risk of blood clots forming after orthopedic surgery. The medical term for these abnormal blood clots is venous thromboembolism (VTE).  What do you need to know about xarelto® ? °Take your Xarelto® ONCE DAILY at the same time every day. °You may take it either with or without food. ° °If you have difficulty swallowing the tablet whole, you may crush it and mix in applesauce just prior to taking your dose. ° °Take Xarelto® exactly as prescribed by your doctor and DO NOT stop taking Xarelto® without talking to the doctor who prescribed the medication.  Stopping without other VTE prevention medication to take the place of Xarelto® may increase your risk of developing a clot. ° °After discharge, you should have regular check-up appointments with your healthcare provider that is prescribing your Xarelto®.   ° °What do you do if you miss a dose? °If you miss a dose, take it as soon as you remember on the same day then continue your regularly scheduled once daily regimen the next day. Do not take two doses of Xarelto® on the same day.  ° °Important Safety Information °A possible side effect of Xarelto® is bleeding. You should call your healthcare provider right away if you experience any of the following: °? Bleeding from an injury or your nose that does not stop. °? Unusual colored urine (red or dark brown) or unusual colored stools (red or black). °? Unusual bruising for unknown reasons. °? A serious fall or if you hit your head (even if there is no bleeding). ° °Some medicines may interact with Xarelto® and might increase your risk of bleeding while on Xarelto®. To help avoid this, consult your healthcare provider or pharmacist prior to using any new prescription or non-prescription medications,  including herbals, vitamins, non-steroidal anti-inflammatory drugs (NSAIDs) and supplements. ° °This website has more information on Xarelto®: www.xarelto.com. ° ° °

## 2019-01-05 ENCOUNTER — Encounter (HOSPITAL_COMMUNITY): Payer: Self-pay | Admitting: Orthopedic Surgery

## 2019-01-05 LAB — CBC
HCT: 33.1 % — ABNORMAL LOW (ref 36.0–46.0)
Hemoglobin: 10.6 g/dL — ABNORMAL LOW (ref 12.0–15.0)
MCH: 31 pg (ref 26.0–34.0)
MCHC: 32 g/dL (ref 30.0–36.0)
MCV: 96.8 fL (ref 80.0–100.0)
Platelets: 160 10*3/uL (ref 150–400)
RBC: 3.42 MIL/uL — ABNORMAL LOW (ref 3.87–5.11)
RDW: 12.4 % (ref 11.5–15.5)
WBC: 14 10*3/uL — ABNORMAL HIGH (ref 4.0–10.5)
nRBC: 0 % (ref 0.0–0.2)

## 2019-01-05 LAB — BASIC METABOLIC PANEL
Anion gap: 6 (ref 5–15)
BUN: 16 mg/dL (ref 8–23)
CO2: 24 mmol/L (ref 22–32)
Calcium: 8.5 mg/dL — ABNORMAL LOW (ref 8.9–10.3)
Chloride: 106 mmol/L (ref 98–111)
Creatinine, Ser: 0.81 mg/dL (ref 0.44–1.00)
GFR calc Af Amer: >60 mL/min (ref >60)
GFR calc non Af Amer: >60 mL/min (ref >60)
Glucose, Bld: 156 mg/dL — ABNORMAL HIGH (ref 70–99)
Potassium: 4.3 mmol/L (ref 3.5–5.1)
Sodium: 136 mmol/L (ref 135–145)

## 2019-01-05 MED ORDER — RIVAROXABAN 10 MG PO TABS: 10.0000 mg | ORAL_TABLET | Freq: Every day | ORAL | 0 refills | Status: DC

## 2019-01-05 MED ORDER — TRAMADOL HCL 50 MG PO TABS: 50.0000 mg | ORAL_TABLET | Freq: Four times a day (QID) | ORAL | 0 refills | Status: DC | PRN

## 2019-01-05 MED ORDER — OXYCODONE HCL 5 MG PO TABS: 5.0000 mg | ORAL_TABLET | Freq: Four times a day (QID) | ORAL | 0 refills | Status: DC | PRN

## 2019-01-05 MED ORDER — METHOCARBAMOL 500 MG PO TABS: 500.0000 mg | ORAL_TABLET | Freq: Four times a day (QID) | ORAL | 0 refills | Status: DC | PRN

## 2019-01-05 NOTE — Progress Notes (Signed)
This evening, patient requested to walk. During my shift assessment, I assessed how she tolerated changing positions/sitting on the edge of bed. She dangled/ sat on edge of bed for about 10 mins. She tolerated this fairly. She had an urge to have a BM. Staff assisted me with transferring her from bed to [facility].  Gave her an Oxycodone prior to the transfer and she got nauseous/vomited. IV Zofran given. BP has been monitored/running high (her baseline). Repositioned pt back to bed. BP was checked and it was 143/70. Pain went down, but it was still there. Assessed patient's nausea. No nausea. Gave patient a tramadol. Pt tolerated this well. Pt currently sleeping/resting. Continuing to monitor patient.

## 2019-01-05 NOTE — Op Note (Signed)
NAME: Terri Ortega, Terri Ortega MEDICAL RECORD DX:[id number] ACCOUNT [id number] DATE OF BIRTH:20-Nov-1940 FACILITY: [facility] LOCATION: [facility] PHYSICIAN:Onis Markoff Zella Ball, MD  OPERATIVE REPORT  DATE OF PROCEDURE:  01/04/2019  PREOPERATIVE DIAGNOSIS:  Failed right total knee arthroplasty.  POSTOPERATIVE DIAGNOSIS:  Failed right total knee arthroplasty.  PROCEDURE:  Right total knee arthroplasty revision.  SURGEON:  Gaynelle Arabian, MD  ASSISTANT:  Theresa Duty, PA-C  ANESTHESIA:  Adductor canal block and spinal.  ESTIMATED BLOOD LOSS:  100 mL.  DRAINS:  Hemovac x1.  TOURNIQUET TIME:  Up 34 minutes at 300 mmHg, down 8 minutes, up additional 21 minutes at 300 mmHg.  COMPLICATIONS:  None.  CONDITION:  Stable to recovery.  BRIEF CLINICAL NOTE:  The patient is a 78 year old female who had a right total knee arthroplasty done over 10 years ago.  She had been doing well until recently when she noticed increased pain and an x-ray showed that the femoral component kicked into  some varus consistent with potential loosening of the femoral component.  Bone scan confirmed that there was loosening.  She presents now for a total knee arthroplasty revision.  Preop infection workup was negative.  PROCEDURE IN DETAIL:  After successful administration of adductor canal block and spinal anesthetic, a tourniquet was placed on the right thigh, and right lower extremity was prepped and draped in the usual sterile fashion.  Extremity was wrapped in an  Esmarch, knee flexed, and tourniquet inflated to 300 mmHg.  A midline incision was made with a 10 blade through subcutaneous tissue to the level of the extensor mechanism.  A fresh blade was used to make a medial parapatellar arthrotomy.  We did not  encounter any fluid or any inflamed-appearing synovium upon entering the joint.  Soft tissue over the proximal medial tibia subperiosteally.  Elevated the joint line with a knife and into the semimembranosus bursa with a  Cobb elevator.  Soft tissue  laterally was elevated with attention being paid to avoid the patellar tendon on the tibial tubercle.  Patella was everted and knee flexed 90 degrees.  I was able to dislocate the tibia from the femur and remove the tibial polyethylene.  Circumferential  retractors were then placed around the tibia.  An oscillating saw was used to disrupt the interface between the tibial component and bone, and the tibial component was easily removed with no bone loss.  Extramedullary tibial alignment guide was placed  and referencing proximally at the medial aspect of the tibial tubercle and distally along the second metatarsal axis and tibial crest.  Block was pinned to remove about 2 mm off the current resection level.  Resection was made with an oscillating saw.   The remainder of the cement was removed from the tibial canal.  The tibia was reamed up to 13 mm for placement of a 13 mm stem.  Proximally, a size 2.5 was the most appropriate tibia size based on the 2.5 that was removed.  We then prepared proximally  with the modular drill, then modular drill and stem extension for the size 2.5 MBT revision tibia.  We then did the keel punch.  We then prepared for a 29 mm sleeve with the broach.  The femur was then addressed.  Osteotomes were used to disrupt the interface between the femoral component and bone, and it was easily removed with no bone loss.  Femoral canal was then accessed and thoroughly irrigated.  Reaming was performed to 20 mm,  which had an excellent press fit.  A 20 mm reamer was left in place to serve as our intramedullary cutting alignment guide.  The distal femoral cutting block was then placed to remove about 2 mm off the distal femur.  Laterally, we were able to get the  bone off medially.  I had to go up 4 more mm in order to get any bony contact, and thus I used a 4 mm augment distal medial.  Size 3 was the most appropriate femoral component.  The AP cutting block was  then placed and the rotation was marked off the  epicondylar axis and confirmed by placing a spacer block in flexion, which gave [place] a stable rectangular flexion gap with that femoral rotation.  The block was pinned in this rotation.  Anteriorly, we did not remove any bone.  There was minimal chamfer  removed and posteriorly minimal bone removed.  The intercondylar block was placed and the intercondylar cut made for the TC3.  Trials were then placed.  On the tibial side, it was a size 2.5 MBT revision tray with 5 mm augments medial and lateral, a 29  sleeve, and a 13 x 30 stem extension.  On the femoral side, to a size TC3 femur with a 4 mm distal medial augment.  There is a stem on it, which is 75 x 20.  It is in the +2 position in 5 degrees of valgus.  With the trials in place, a 15 mm insert was  necessary in order to allow for full extension with excellent varus/valgus and anterior/posterior balance throughout full range of motion.  The patella was cleared of soft tissue; thus, patelloplasty was performed.  The patellar components were in  excellent position and tracked normally; thus, I did not need to revise the patellar component.  We then let the tourniquet down after a total time up of 34 minutes.  I took the tourniquet down for 8 minutes while the components were assembled on the  back table.  There was minimal bleeding noted.  The components were assembled on the back table, and after 8 minutes the leg was rewrapped in Esmarch and tourniquet reinflated to 300 mmHg.  The cut bone surface was then prepared with pulsatile lavage and  cement was mixed.  It was 3 batches of gentamicin impregnated cement.  Once ready for implantation, it was injected into the tibial canal.  A cement restrictor had been placed prior to this.  The tibial component was cemented into place and all extruded  cement removed.  On the femoral side, we cemented distally but had a press-fit in the canal with that stem.  It was  impacted and all extruded cement was removed.  A 15 mm insert was placed, knee held in full extension, and all extruded cement removed.   As the cement hardened, the knee permanent size 3, 15 mm TC3 rotating platform insert was placed into the tibial tray.  The knee was reduced with outstanding stability throughout full range of motion.  The wound was copiously irrigated with saline  solution and the arthrotomy closed over a Hemovac drain with a running 0 Stratafix suture.  Prior to closure, I had injected the extensor mechanism subcu tissues, the posterior capsule, and the periosteum of the femur with a total of 20 mL of Exparel  mixed with 60 mL of saline.  After the arthrotomy was closed, the tourniquet was released for the second time at 21 minutes.  A total of 55 minutes between the 1st and  2nd tourniquet times.  Then a second limb of the Hemovac drain was placed subcu and  then subcutaneous tissue closed with interrupted 2-0 Vicryl and subcuticular running 4-0 Monocryl.  Incision was cleaned and dried, and Steri-Strips and a bulky sterile dressing were applied.  She was placed into a knee immobilizer, awakened and  transported to recovery in stable condition.  LN/NUANCE  D:01/04/2019 T:01/04/2019 JOB:[id number]

## 2019-01-05 NOTE — Progress Notes (Signed)
   Subjective: 1 Day Post-Op Procedure(s) (LRB): TOTAL KNEE REVISION (Right) Patient reports pain as moderate.   Patient seen in rounds by Dr. Wynelle Link. Patient is well, and has had no acute complaints or problems other than pain in the right knee. Was having issues with nausea earlier, feeling better this AM. Denies chest pain, SOB, or calf pain. Foley catheter removed this AM. We will continue therapy today.   Objective: Vital signs in last 24 hours: Temp:  [97.5 F (36.4 C)-98.2 F (36.8 C)] 98 F (36.7 C) (06/04 0522) Pulse Rate:  [53-87] 65 (06/04 0522) Resp:  [10-25] 20 (06/04 0522) BP: (143-189)/(56-105) 148/87 (06/04 0522) SpO2:  [92 %-100 %] 98 % (06/04 0522) Weight:  [78 kg] 78 kg (06/03 0804)  Intake/Output from previous day:  Intake/Output Summary (Last 24 hours) at 01/05/2019 0722 Last data filed at 01/05/2019 3810 Gross per 24 hour  Intake 3155 ml  Output 2955 ml  Net 200 ml    Labs: Recent Labs    01/05/19 0435  HGB 10.6*   Recent Labs    01/05/19 0435  WBC 14.0*  RBC 3.42*  HCT 33.1*  PLT 160   Recent Labs    01/05/19 0435  NA 136  K 4.3  CL 106  CO2 24  BUN 16  CREATININE 0.81  GLUCOSE 156*  CALCIUM 8.5*   Exam: General - Patient is Alert and Oriented Extremity - Neurologically intact Neurovascular intact Sensation intact distally Dorsiflexion/Plantar flexion intact Dressing - dressing C/D/I Motor Function - intact, moving foot and toes well on exam.   Past Medical History:  Diagnosis Date  . Graves disease   . HTN (hypertension)   . Osteoporosis   . Radiation 03/20/14, 03/27/14, 04/05/14, 04/12/14, 04/17/14   bracytherapy to proximal vagina 30 gray  . Scoliosis   . Uterine cancer (HCC)    surgery and radiation x 5 treatments-last 9'15    Assessment/Plan: 1 Day Post-Op Procedure(s) (LRB): TOTAL KNEE REVISION (Right) Principal Problem:   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HCC) Active Problems:   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  (HCC)  Estimated body mass index is 29.52 kg/m as calculated from the following:   Height as of this encounter: 5\' 4"  (1.626 m).   Weight as of this encounter: 78 kg. Advance diet Up with therapy D/C IV fluids  DVT Prophylaxis - Xarelto Weight bearing as tolerated. D/C O2 and pulse ox and try on room air. Hemovac pulled without difficulty, will continue therapy.  Plan is to go Home after hospital stay.  If she progresses significantly with therapy today and is meeting her goals by this afternoon, can discharge today with outpatient PT at [facility]. Otherwise will stay overnight and plan for d/c tomorrow. Follow-up in the office in 2 weeks.   Theresa Duty, PA-C Orthopedic Surgery 01/05/2019, 7:22 AM

## 2019-01-05 NOTE — Progress Notes (Signed)
Physical Therapy Treatment Patient Details Name: Terri Ortega MRN: [id number] DOB: February 18, 1941 Today's Date: 01/05/2019    History of Present Illness R TKA revision; PMH of HTN, endometrial cancer    PT Comments    Pt moving slowly and requiring increased time but with marked improvement in activity tolerance vs yesterday.   Follow Up Recommendations  Follow surgeon's recommendation for DC plan and follow-up therapies     Equipment Recommendations  Rolling walker with 5" wheels;3in1 (PT)    Recommendations for Other Services       Precautions / Restrictions Precautions Precautions: Fall;Knee Restrictions Weight Bearing Restrictions: No Other Position/Activity Restrictions: WBAT    Mobility  Bed Mobility Overal bed mobility: Needs Assistance Bed Mobility: Sit to Supine       Sit to supine: Min assist   General bed mobility comments: cues for sequence with assist to manage R LE  Transfers Overall transfer level: Needs assistance Equipment used: Rolling walker (2 wheeled) Transfers: Sit to/from Stand Sit to Stand: Min assist         General transfer comment: cues for LE management and use of UEs to self assist  Ambulation/Gait Ambulation/Gait assistance: Min assist Gait Distance (Feet): 45 Feet Assistive device: Rolling walker (2 wheeled) Gait Pattern/deviations: Step-to pattern;Decreased step length - right;Decreased step length - left;Shuffle;Trunk flexed Gait velocity: decr   General Gait Details: cues for posture, sequence and position from [employer] Mobility    Modified Rankin (Stroke Patients Only)       Balance Overall balance assessment: Needs assistance Sitting-balance support: Feet supported;No upper extremity supported Sitting balance-Leahy Scale: Good     Standing balance support: Bilateral upper extremity supported Standing balance-Leahy Scale: Poor                               Cognition Arousal/Alertness: Awake/alert Behavior During Therapy: WFL for tasks assessed/performed Overall Cognitive Status: Within Functional Limits for tasks assessed                                        Exercises Total Joint Exercises Ankle Circles/Pumps: AROM;Supine;15 reps Quad Sets: AROM;Both;10 reps;Supine Heel Slides: AAROM;Right;Supine;15 reps Straight Leg Raises: AAROM;Right;Supine;10 reps Goniometric ROM: AAROM R knee -8 - 75    General Comments        Pertinent Vitals/Pain Pain Assessment: 0-10 Pain Score: 5  Pain Location: R knee Pain Descriptors / Indicators: Sore Pain Intervention(s): Limited activity within patient's tolerance;Monitored during session;Premedicated before session;Ice applied    Home Living                      Prior Function            PT Goals (current goals can now be found in the care plan section) Acute Rehab PT Goals Patient Stated Goal: to be able to walk, play with grandkid PT Goal Formulation: With patient Time For Goal Achievement: 01/11/19 Potential to Achieve Goals: Good Progress towards PT goals: Progressing toward goals    Frequency    7X/week      PT Plan Current plan remains appropriate    Co-evaluation              AM-PAC PT "6 Clicks" Mobility   Outcome Measure  Help needed turning from your back to your side while in a flat bed without using bedrails?: A Little Help needed moving from lying on your back to sitting on the side of a flat bed without using bedrails?: A Little Help needed moving to and from a bed to a chair (including a wheelchair)?: A Little Help needed standing up from a chair using your arms (e.g., wheelchair or bedside chair)?: A Little Help needed to walk in hospital room?: A Little Help needed climbing 3-5 steps with a railing? : A Lot 6 Click Score: 17    End of Session Equipment Utilized During Treatment: Gait belt Activity Tolerance: Patient  tolerated treatment well Patient left: in bed;with call bell/phone within reach;with bed alarm set Nurse Communication: Mobility status PT Visit Diagnosis: Difficulty in walking, not elsewhere classified (R26.2);Pain;Muscle weakness (generalized) (M62.81) Pain - Right/Left: Right Pain - part of body: Knee     Time: 9892-1194 PT Time Calculation (min) (ACUTE ONLY): 28 min  Charges:  $Gait Training: 8-22 mins $Therapeutic Exercise: 8-22 mins                     [place] Pager [phone] Office [phone]    Terri Ortega 01/05/2019, 12:47 PM

## 2019-01-05 NOTE — Progress Notes (Signed)
Physical Therapy Treatment Patient Details Name: Terri Ortega MRN: [id number] DOB: 1940-08-09 Today's Date: 01/05/2019    History of Present Illness R TKA revision; PMH of HTN, endometrial cancer    PT Comments    Pt continues cooperative but fatigued and requiring increased time for all tasks.    Follow Up Recommendations  Follow surgeon's recommendation for DC plan and follow-up therapies     Equipment Recommendations  Rolling walker with 5" wheels;3in1 (PT)    Recommendations for Other Services       Precautions / Restrictions Precautions Precautions: Fall;Knee Restrictions Weight Bearing Restrictions: No Other Position/Activity Restrictions: WBAT    Mobility  Bed Mobility Overal bed mobility: Needs Assistance Bed Mobility: Supine to Sit;Sit to Supine     Supine to sit: Mod assist Sit to supine: Min assist   General bed mobility comments: cues for sequence with assist to manage R LE and to bring trunk to upright  Transfers Overall transfer level: Needs assistance Equipment used: Rolling walker (2 wheeled) Transfers: Sit to/from Stand Sit to Stand: Min assist         General transfer comment: cues for LE management and use of UEs to self assist  Ambulation/Gait Ambulation/Gait assistance: Min assist Gait Distance (Feet): 74 Feet Assistive device: Rolling walker (2 wheeled) Gait Pattern/deviations: Step-to pattern;Decreased step length - right;Decreased step length - left;Shuffle;Trunk flexed Gait velocity: decr   General Gait Details: Increased time with cues for posture, sequence and position from [employer] Mobility    Modified Rankin (Stroke Patients Only)       Balance Overall balance assessment: Needs assistance Sitting-balance support: Feet supported;No upper extremity supported Sitting balance-Leahy Scale: Good     Standing balance support: Bilateral upper extremity supported Standing balance-Leahy  Scale: Poor                              Cognition Arousal/Alertness: Awake/alert Behavior During Therapy: WFL for tasks assessed/performed Overall Cognitive Status: Within Functional Limits for tasks assessed                                        Exercises Total Joint Exercises Ankle Circles/Pumps: AROM;Supine;15 reps Quad Sets: AROM;Both;10 reps;Supine Heel Slides: AAROM;Right;Supine;15 reps Straight Leg Raises: AAROM;Right;Supine;10 reps Goniometric ROM: AAROM R knee -8 - 75    General Comments        Pertinent Vitals/Pain Pain Assessment: 0-10 Pain Score: 5  Pain Location: R knee Pain Descriptors / Indicators: Sore Pain Intervention(s): Limited activity within patient's tolerance;Monitored during session;Premedicated before session;Ice applied    Home Living                      Prior Function            PT Goals (current goals can now be found in the care plan section) Acute Rehab PT Goals Patient Stated Goal: to be able to walk, play with grandkid PT Goal Formulation: With patient Time For Goal Achievement: 01/11/19 Potential to Achieve Goals: Good Progress towards PT goals: Progressing toward goals    Frequency    7X/week      PT Plan Current plan remains appropriate    Co-evaluation  AM-PAC PT "6 Clicks" Mobility   Outcome Measure  Help needed turning from your back to your side while in a flat bed without using bedrails?: A Little Help needed moving from lying on your back to sitting on the side of a flat bed without using bedrails?: A Little Help needed moving to and from a bed to a chair (including a wheelchair)?: A Little Help needed standing up from a chair using your arms (e.g., wheelchair or bedside chair)?: A Little Help needed to walk in hospital room?: A Little Help needed climbing 3-5 steps with a railing? : A Lot 6 Click Score: 17    End of Session Equipment Utilized During  Treatment: Gait belt Activity Tolerance: Patient tolerated treatment well Patient left: in bed;with call bell/phone within reach;with bed alarm set Nurse Communication: Mobility status PT Visit Diagnosis: Difficulty in walking, not elsewhere classified (R26.2);Pain;Muscle weakness (generalized) (M62.81) Pain - Right/Left: Right Pain - part of body: Knee     Time: 9390-3009 PT Time Calculation (min) (ACUTE ONLY): 26 min  Charges:  $Gait Training: 23-37 mins $Therapeutic Exercise: 8-22 mins                     [place] Pager [phone] Office [phone]    Jiovanny Burdell 01/05/2019, 3:13 PM

## 2019-01-06 LAB — BASIC METABOLIC PANEL
Anion gap: 4 — ABNORMAL LOW (ref 5–15)
BUN: 16 mg/dL (ref 8–23)
CO2: 27 mmol/L (ref 22–32)
Calcium: 8.3 mg/dL — ABNORMAL LOW (ref 8.9–10.3)
Chloride: 105 mmol/L (ref 98–111)
Creatinine, Ser: 0.72 mg/dL (ref 0.44–1.00)
GFR calc Af Amer: >60 mL/min (ref >60)
GFR calc non Af Amer: >60 mL/min (ref >60)
Glucose, Bld: 136 mg/dL — ABNORMAL HIGH (ref 70–99)
Potassium: 3.5 mmol/L (ref 3.5–5.1)
Sodium: 136 mmol/L (ref 135–145)

## 2019-01-06 LAB — CBC
HCT: 31.6 % — ABNORMAL LOW (ref 36.0–46.0)
Hemoglobin: 10 g/dL — ABNORMAL LOW (ref 12.0–15.0)
MCH: 31 pg (ref 26.0–34.0)
MCHC: 31.6 g/dL (ref 30.0–36.0)
MCV: 97.8 fL (ref 80.0–100.0)
Platelets: 157 10*3/uL (ref 150–400)
RBC: 3.23 MIL/uL — ABNORMAL LOW (ref 3.87–5.11)
RDW: 12.6 % (ref 11.5–15.5)
WBC: 15.1 10*3/uL — ABNORMAL HIGH (ref 4.0–10.5)
nRBC: 0 % (ref 0.0–0.2)

## 2019-01-06 NOTE — Progress Notes (Signed)
   Subjective: 2 Days Post-Op Procedure(s) (LRB): TOTAL KNEE REVISION (Right) Patient reports pain as moderate.   Patient seen in rounds with Dr. Wynelle Link. Patient is well, and has had no acute complaints or problems other than discomfort in the right knee. Denies SOB and chest pain. Positive flatus. Voiding well.  Plan is to go Home after hospital stay.  Objective: Vital signs in last 24 hours: Temp:  [97.7 F (36.5 C)-98.3 F (36.8 C)] 98.1 F (36.7 C) (06/05 0440) Pulse Rate:  [73-88] 73 (06/05 0440) Resp:  [16-18] 16 (06/05 0440) BP: (133-176)/(63-71) 161/63 (06/05 0440) SpO2:  [92 %-99 %] 93 % (06/05 0440)  Intake/Output from previous day:  Intake/Output Summary (Last 24 hours) at 01/06/2019 0745 Last data filed at 01/06/2019 0440 Gross per 24 hour  Intake 1647.5 ml  Output 1700 ml  Net -52.5 ml     Labs: Recent Labs    01/05/19 0435 01/06/19 0418  HGB 10.6* 10.0*   Recent Labs    01/05/19 0435 01/06/19 0418  WBC 14.0* 15.1*  RBC 3.42* 3.23*  HCT 33.1* 31.6*  PLT 160 157   Recent Labs    01/05/19 0435 01/06/19 0418  NA 136 136  K 4.3 3.5  CL 106 105  CO2 24 27  BUN 16 16  CREATININE 0.81 0.72  GLUCOSE 156* 136*  CALCIUM 8.5* 8.3*    EXAM General - Patient is Alert and Oriented Extremity - Neurologically intact Intact pulses distally Dorsiflexion/Plantar flexion intact No cellulitis present Compartment soft Dressing/Incision - clean, dry, no drainage Motor Function - intact, moving foot and toes well on exam.   Past Medical History:  Diagnosis Date  . Graves disease   . HTN (hypertension)   . Osteoporosis   . Radiation 03/20/14, 03/27/14, 04/05/14, 04/12/14, 04/17/14   bracytherapy to proximal vagina 30 gray  . Scoliosis   . Uterine cancer (HCC)    surgery and radiation x 5 treatments-last 9'15    Assessment/Plan: 2 Days Post-Op Procedure(s) (LRB): TOTAL KNEE REVISION (Right) Principal Problem:   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HCC)  Active Problems:   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 (HCC)  Estimated body mass index is 29.52 kg/m as calculated from the following:   Height as of this encounter: 5\' 4"  (1.626 m).   Weight as of this encounter: 78 kg. Advance diet Up with therapy  DVT Prophylaxis - Xarelto Weight-Bearing as tolerated   Continue PT this morning. If meeting goals, DC home today. Start outpatient PT Monday. Discharge instructions given. Follow up in office in two weeks.   Ardeen Jourdain, PA-C Orthopaedic Surgery 01/06/2019, 7:45 AM

## 2019-01-06 NOTE — Progress Notes (Signed)
Physical Therapy Treatment Patient Details Name: Terri Ortega MRN: [id number] DOB: April 27, 1941 Today's Date: 01/06/2019    History of Present Illness R TKA revision; PMH of HTN, endometrial cancer    PT Comments    Pt progressing steadily with mobility and eager to return home.  Pt reviewed stairs and home therex program with written instruction provided.   Follow Up Recommendations  Follow surgeon's recommendation for DC plan and follow-up therapies     Equipment Recommendations  Rolling walker with 5" wheels;3in1 (PT)    Recommendations for Other Services       Precautions / Restrictions Precautions Precautions: Fall;Knee Restrictions Weight Bearing Restrictions: No Other Position/Activity Restrictions: WBAT    Mobility  Bed Mobility Overal bed mobility: Needs Assistance Bed Mobility: Supine to Sit     Supine to sit: Min assist     General bed mobility comments: cues for sequence with assist to manage R LE and to bring trunk to upright  Transfers Overall transfer level: Needs assistance Equipment used: Rolling walker (2 wheeled) Transfers: Sit to/from Stand Sit to Stand: Min assist;Min guard         General transfer comment: cues for LE management and use of UEs to self assist  Ambulation/Gait Ambulation/Gait assistance: Min guard Gait Distance (Feet): 75 Feet Assistive device: Rolling walker (2 wheeled) Gait Pattern/deviations: Step-to pattern;Decreased step length - right;Decreased step length - left;Shuffle;Trunk flexed Gait velocity: decr   General Gait Details: Increased time with cues for posture, sequence and position from RW   Stairs Stairs: Yes Stairs assistance: Min assist Stair Management: Two rails;Step to pattern;Forwards Number of Stairs: 3 General stair comments: cues for sequence    Wheelchair Mobility    Modified Rankin (Stroke Patients Only)       Balance Overall balance assessment: Needs assistance Sitting-balance  support: Feet supported;No upper extremity supported Sitting balance-Leahy Scale: Good     Standing balance support: Bilateral upper extremity supported Standing balance-Leahy Scale: Fair                              Cognition Arousal/Alertness: Awake/alert Behavior During Therapy: WFL for tasks assessed/performed Overall Cognitive Status: Within Functional Limits for tasks assessed                                        Exercises Total Joint Exercises Ankle Circles/Pumps: AROM;Supine;15 reps Quad Sets: AROM;Both;10 reps;Supine Heel Slides: AAROM;Right;Supine;15 reps Straight Leg Raises: AAROM;Right;Supine;20 reps Knee Flexion: AAROM;Right;5 reps;Seated    General Comments        Pertinent Vitals/Pain Pain Assessment: 0-10 Pain Score: 5  Pain Location: R knee Pain Descriptors / Indicators: Sore Pain Intervention(s): Limited activity within patient's tolerance;Monitored during session;Premedicated before session;Ice applied    Home Living                      Prior Function            PT Goals (current goals can now be found in the care plan section) Acute Rehab PT Goals Patient Stated Goal: to be able to walk, play with grandkid PT Goal Formulation: With patient Time For Goal Achievement: 01/11/19 Potential to Achieve Goals: Good Progress towards PT goals: Progressing toward goals    Frequency    7X/week      PT Plan Current plan remains appropriate  Co-evaluation              AM-PAC PT "6 Clicks" Mobility   Outcome Measure  Help needed turning from your back to your side while in a flat bed without using bedrails?: A Little Help needed moving from lying on your back to sitting on the side of a flat bed without using bedrails?: A Little Help needed moving to and from a bed to a chair (including a wheelchair)?: A Little Help needed standing up from a chair using your arms (e.g., wheelchair or bedside  chair)?: A Little Help needed to walk in hospital room?: A Little Help needed climbing 3-5 steps with a railing? : A Little 6 Click Score: 18    End of Session Equipment Utilized During Treatment: Gait belt Activity Tolerance: Patient tolerated treatment well Patient left: in chair;with call bell/phone within reach;with chair alarm set Nurse Communication: Mobility status PT Visit Diagnosis: Difficulty in walking, not elsewhere classified (R26.2);Pain;Muscle weakness (generalized) (M62.81) Pain - Right/Left: Right Pain - part of body: Knee     Time: 0812-0908 PT Time Calculation (min) (ACUTE ONLY): 56 min  Charges:  $Gait Training: 8-22 mins $Therapeutic Exercise: 8-22 mins $Therapeutic Activity: 8-22 mins                     Debe Coder PT Acute Rehabilitation Services Pager [phone] Office [phone]    Dory Verdun 01/06/2019, 1:11 PM

## 2019-01-06 NOTE — Care Management Important Message (Signed)
Important Message  Patient Details IM Letter given to Velva Harman RN to present to the Patient Name: Terri Ortega MRN: [id number] Date of Birth: 1941-07-13   Medicare Important Message Given:  Yes    Kerin Salen 01/06/2019, 10:25 AM

## 2019-01-09 NOTE — Discharge Summary (Signed)
Physician Discharge Summary   Patient ID: Terri Ortega MRN: [id number] DOB/AGE: [birth year] 78 y.o.  Admit date: 01/04/2019 Discharge date: 01/06/2019  Primary Diagnosis: Failed right total knee replacement   Admission Diagnoses:  Past Medical History:  Diagnosis Date   Graves disease    HTN (hypertension)    Osteoporosis    Radiation 03/20/14, 03/27/14, 04/05/14, 04/12/14, 04/17/14   bracytherapy to proximal vagina 30 gray   Scoliosis    Uterine cancer ([place])    surgery and radiation x 5 treatments-last 9'15   Discharge Diagnoses:   Principal Problem:   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[place]) Active Problems:   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 ([place])  Estimated body mass index is 29.52 kg/m as calculated from the following:   Height as of this encounter: 5\' 4"  (1.626 m).   Weight as of this encounter: 78 kg.  Procedure:  Procedure(s) (LRB): TOTAL KNEE REVISION (Right)   Consults: None  HPI: Terri Ortega, 78 y.o. female, has a history of pain and functional disability in the right knee(s) due to arthritis and patient has failed non-surgical conservative treatments for greater than 12 weeks to include supervised PT with diminished ADL's post treatment and activity modification. The indications for the revision of the total knee arthroplasty are loosening of one or more components. Onset of symptoms was gradual starting 5 years ago with gradually worsening course since that time.  Prior procedures on the right knee(s) include arthroplasty. Patient currently rates pain in the right knee(s) at 8 out of 10 with activity. There is worsening of pain with activity and weight bearing, pain that interferes with activities of daily living and instability.  Bone scan of the right knee shows increased uptake along the femoral and tibial aspects of the medial part of the knee. This condition presents safety issues increasing the risk of falls. There is no current active  infection.  Laboratory Data: Admission on 01/04/2019, Discharged on 01/06/2019  Component Date Value Ref Range Status   WBC 01/05/2019 14.0* 4.0 - 10.5 K/uL Final   RBC 01/05/2019 3.42* 3.87 - 5.11 MIL/uL Final   Hemoglobin 01/05/2019 10.6* 12.0 - 15.0 g/dL Final   HCT 01/05/2019 33.1* 36.0 - 46.0 % Final   MCV 01/05/2019 96.8  80.0 - 100.0 fL Final   MCH 01/05/2019 31.0  26.0 - 34.0 pg Final   MCHC 01/05/2019 32.0  30.0 - 36.0 g/dL Final   RDW 01/05/2019 12.4  11.5 - 15.5 % Final   Platelets 01/05/2019 160  150 - 400 K/uL Final   nRBC 01/05/2019 0.0  0.0 - 0.2 % Final   Performed at [facility], [place] [address], [place], [place] [address]   Sodium 01/05/2019 136  135 - 145 mmol/L Final   Potassium 01/05/2019 4.3  3.5 - 5.1 mmol/L Final   Chloride 01/05/2019 106  98 - 111 mmol/L Final   CO2 01/05/2019 24  22 - 32 mmol/L Final   Glucose, Bld 01/05/2019 156* 70 - 99 mg/dL Final   BUN 01/05/2019 16  8 - 23 mg/dL Final   Creatinine, Ser 01/05/2019 0.81  0.44 - 1.00 mg/dL Final   Calcium 01/05/2019 8.5* 8.9 - 10.3 mg/dL Final   GFR calc non Af Amer 01/05/2019 >60  >60 mL/min Final   GFR calc Af Amer 01/05/2019 >60  >60 mL/min Final   Anion gap 01/05/2019 6  5 - 15 Final   Performed at [facility], [place] [address] [place],  [place] [address]   WBC 01/06/2019 15.1* 4.0 - 10.5 K/uL Final   RBC 01/06/2019 3.23* 3.87 - 5.11 MIL/uL Final   Hemoglobin 01/06/2019 10.0* 12.0 - 15.0 g/dL Final   HCT 01/06/2019 31.6* 36.0 - 46.0 % Final   MCV 01/06/2019 97.8  80.0 - 100.0 fL Final   MCH 01/06/2019 31.0  26.0 - 34.0 pg Final   MCHC 01/06/2019 31.6  30.0 - 36.0 g/dL Final   RDW 01/06/2019 12.6  11.5 - 15.5 % Final   Platelets 01/06/2019 157  150 - 400 K/uL Final   nRBC 01/06/2019 0.0  0.0 - 0.2 % Final   Performed at [facility], [place] [address], [place], [place] [address]   Sodium 01/06/2019 136  135 - 145  mmol/L Final   Potassium 01/06/2019 3.5  3.5 - 5.1 mmol/L Final   DELTA CHECK NOTED   Chloride 01/06/2019 105  98 - 111 mmol/L Final   CO2 01/06/2019 27  22 - 32 mmol/L Final   Glucose, Bld 01/06/2019 136* 70 - 99 mg/dL Final   BUN 01/06/2019 16  8 - 23 mg/dL Final   Creatinine, Ser 01/06/2019 0.72  0.44 - 1.00 mg/dL Final   Calcium 01/06/2019 8.3* 8.9 - 10.3 mg/dL Final   GFR calc non Af Amer 01/06/2019 >60  >60 mL/min Final   GFR calc Af Amer 01/06/2019 >60  >60 mL/min Final   Anion gap 01/06/2019 4* 5 - 15 Final   Performed at [facility], [place] [address], [place], [place] [address]  Hospital Outpatient Visit on 01/02/2019  Component Date Value Ref Range Status   SARS-CoV-2, NAA 01/02/2019 NOT DETECTED  NOT DETECTED Final   Comment: (NOTE) This test was developed and its performance characteristics determined by [employer]. This test has not been FDA cleared or approved. This test has been authorized by FDA under an Emergency Use Authorization (EUA). This test is only authorized for the duration of time the declaration that circumstances exist justifying the authorization of the emergency use of in vitro diagnostic tests for detection of SARS-CoV-2 virus and/or diagnosis of COVID-19 infection under section 564(b)(1) of the Act, 21 U.S.C. 151VOH-6(W)(7), unless the authorization is terminated or revoked sooner. When diagnostic testing is negative, the possibility of a false negative result should be considered in the context of a patient's recent exposures and the presence of clinical signs and symptoms consistent with COVID-19. An individual without symptoms of COVID-19 and who is not shedding SARS-CoV-2 virus would expect to have a negative (not detected) result in this assay. Performed                           At: [facility] [place], [place] [id number] Rush Farmer MD WN:[id number]    Coronavirus Source  01/02/2019 NASOPHARYNGEAL   Final   Performed at [facility], [place] [address] [place], [place] [address]  Hospital Outpatient Visit on 12/30/2018  Component Date Value Ref Range Status   aPTT 12/30/2018 33  24 - 36 seconds Final   Performed at [facility], [place] [address] [place], [place] [address]   WBC 12/30/2018 7.7  4.0 - 10.5 K/uL Final   RBC 12/30/2018 4.16  3.87 - 5.11 MIL/uL Final   Hemoglobin 12/30/2018 13.3  12.0 - 15.0 g/dL Final   HCT 12/30/2018 40.7  36.0 - 46.0 % Final   MCV 12/30/2018 97.8  80.0 -  100.0 fL Final   MCH 12/30/2018 32.0  26.0 - 34.0 pg Final   MCHC 12/30/2018 32.7  30.0 - 36.0 g/dL Final   RDW 12/30/2018 12.7  11.5 - 15.5 % Final   Platelets 12/30/2018 200  150 - 400 K/uL Final   nRBC 12/30/2018 0.0  0.0 - 0.2 % Final   Performed at [facility], [place] [address] [place], [place] [address]   Sodium 12/30/2018 140  135 - 145 mmol/L Final   Potassium 12/30/2018 3.7  3.5 - 5.1 mmol/L Final   Chloride 12/30/2018 107  98 - 111 mmol/L Final   CO2 12/30/2018 28  22 - 32 mmol/L Final   Glucose, Bld 12/30/2018 102* 70 - 99 mg/dL Final   BUN 12/30/2018 23  8 - 23 mg/dL Final   Creatinine, Ser 12/30/2018 0.81  0.44 - 1.00 mg/dL Final   Calcium 12/30/2018 9.0  8.9 - 10.3 mg/dL Final   Total Protein 12/30/2018 7.0  6.5 - 8.1 g/dL Final   Albumin 12/30/2018 3.8  3.5 - 5.0 g/dL Final   AST 12/30/2018 22  15 - 41 U/L Final   ALT 12/30/2018 15  0 - 44 U/L Final   Alkaline Phosphatase 12/30/2018 53  38 - 126 U/L Final   Total Bilirubin 12/30/2018 0.9  0.3 - 1.2 mg/dL Final   GFR calc non Af Amer 12/30/2018 >60  >60 mL/min Final   GFR calc Af Amer 12/30/2018 >60  >60 mL/min Final   Anion gap 12/30/2018 5  5 - 15 Final   Performed at [facility], [place] [address], [place], [place] [address]   Prothrombin Time 12/30/2018 14.0  11.4 - 15.2 seconds Final   INR 12/30/2018 1.1  0.8 -  1.2 Final   Comment: (NOTE) INR goal varies based on device and disease states. Performed at [facility], [place] [address], [place], [place] [address]    ABO/RH(D) 12/30/2018 O POS   Final   Antibody Screen 12/30/2018 NEG   Final   Sample Expiration 12/30/2018 01/07/2019,2359   Final   Extend sample reason 12/30/2018    Final                   Value:NO TRANSFUSIONS OR PREGNANCY IN THE PAST 3 MONTHS Performed at [place] [address], [place], [place] [address]    MRSA, PCR 12/30/2018 NEGATIVE  NEGATIVE Final   Staphylococcus aureus 12/30/2018 NEGATIVE  NEGATIVE Final   Comment: (NOTE) The Xpert SA Assay (FDA approved for NASAL specimens in patients 8 years of age and older), is one component of a comprehensive surveillance program. It is not intended to diagnose infection nor to guide or monitor treatment. Performed at [facility], [place] [address] [place], [place] [address]      X-Rays:Mm 3d Screen Breast Bilateral  Result Date: 12/22/2018 CLINICAL DATA:  Screening. EXAM: DIGITAL SCREENING BILATERAL MAMMOGRAM WITH TOMO AND CAD COMPARISON:  Previous exam(s). ACR Breast Density Category b: There are scattered areas of fibroglandular density. FINDINGS: There are no findings suspicious for malignancy. Images were processed with CAD. IMPRESSION: No mammographic evidence of malignancy. A result letter of this screening mammogram will be mailed directly to the patient. RECOMMENDATION: Screening mammogram in one year. (Code:[id number]-B-01Y) BI-RADS CATEGORY  1: Negative. Electronically Signed   By: Curlene Dolphin M.D.   On: 12/22/2018 16:53     Hospital Course: Terri Ortega is a 78 y.o. who was  admitted to [facility]. They were brought to the operating room on 01/04/2019 and underwent Procedure(s): TOTAL KNEE REVISION.  Patient tolerated the procedure well and was later transferred to the recovery room and then to the  orthopaedic floor for postoperative care.  They were given PO and IV analgesics for pain control following their surgery.  They were given 24 hours of postoperative antibiotics of  Anti-infectives (From admission, onward)   Start     Dose/Rate Route Frequency Ordered Stop   01/04/19 1600  ceFAZolin (ANCEF) IVPB 2g/100 mL premix     2 g 200 mL/hr over 30 Minutes Intravenous Every 6 hours 01/04/19 1426 01/04/19 2211   01/04/19 0745  ceFAZolin (ANCEF) IVPB 2g/100 mL premix     2 g 200 mL/hr over 30 Minutes Intravenous On call to O.R. 01/04/19 2595 01/04/19 0946     and started on DVT prophylaxis in the form of Xarelto.   PT and OT were ordered for total joint protocol.  Discharge planning consulted to help with postop disposition and equipment needs.  Patient had a fair night on the evening of surgery.  They started to get up OOB with therapy on day one. Hemovac drain was pulled without difficulty.  Continued to work with therapy into day two.  Dressing was changed on day two and the incision was clean and dry.  The patient had progressed with therapy and meeting their goals.  Incision was healing well.  Patient was seen in rounds and was ready to go home.   Diet: Cardiac diet Activity:WBAT Follow-up:in 2 weeks Disposition - Home Discharged Condition: stable   Discharge Instructions    Call MD / Call 911   Complete by:  As directed    If you experience chest pain or shortness of breath, CALL 911 and be transported to the hospital emergency room.  If you develope a fever above 101 F, pus (white drainage) or increased drainage or redness at the wound, or calf pain, call your surgeon's office.   Change dressing   Complete by:  As directed    Change dressing on Friday, then change the dressing daily with sterile 4 x 4 inch gauze dressing and apply TED hose.   Constipation Prevention   Complete by:  As directed    Drink plenty of fluids.  Prune juice may be helpful.  You may use a stool  softener, such as Colace (over the counter) 100 mg twice a day.  Use MiraLax (over the counter) for constipation as needed.   Diet - low sodium heart healthy   Complete by:  As directed    Discharge instructions   Complete by:  As directed    Dr. Gaynelle Arabian Total Joint Specialist [facility] [address] [place],  [address] [phone]  TOTAL KNEE REPLACEMENT POSTOPERATIVE DIRECTIONS  Knee Rehabilitation, Guidelines Following Surgery  Results after knee surgery are often greatly improved when you follow the exercise, range of motion and muscle strengthening exercises prescribed by your doctor. Safety measures are also important to protect the knee from further injury. Any time any of these exercises cause you to have increased pain or swelling in your knee joint, decrease the amount until you are comfortable again and slowly increase them. If you have problems or questions, call your caregiver or physical therapist for advice.   HOME CARE INSTRUCTIONS  Remove items at home which could result in a fall. This includes throw rugs or furniture in walking pathways.  ICE to the affected knee every three hours for 30 minutes at a time and then as needed for pain and swelling.  Continue to use ice on the knee for pain and swelling from surgery. You may notice swelling that will progress down to the foot and ankle.  This is normal after surgery.  Elevate the leg when you are not up walking on it.   Continue to use the breathing machine which will help keep your temperature down.  It is common for your temperature to cycle up and down following surgery, especially at night when you are not up moving around and exerting yourself.  The breathing machine keeps your lungs expanded and your temperature down. Do not place pillow under knee, focus on keeping the knee straight while resting   DIET You may resume your previous home diet once your are discharged from the  hospital.  DRESSING / WOUND CARE / SHOWERING You may change your dressing 3-5 days after surgery.  Then change the dressing every day with sterile gauze.  Please use good hand washing techniques before changing the dressing.  Do not use any lotions or creams on the incision until instructed by your surgeon. You may start showering once you are discharged home but do not submerge the incision under water. Just pat the incision dry and apply a dry gauze dressing on daily. Change the surgical dressing daily and reapply a dry dressing each time.  ACTIVITY Walk with your walker as instructed. Use walker as long as suggested by your caregivers. Avoid periods of inactivity such as sitting longer than an hour when not asleep. This helps prevent blood clots.  You may resume a sexual relationship in one month or when given the OK by your doctor.  You may return to work once you are cleared by your doctor.  Do not drive a car for 6 weeks or until released by you surgeon.  Do not drive while taking narcotics.  WEIGHT BEARING Weight bearing as tolerated with assist device (walker, cane, etc) as directed, use it as long as suggested by your surgeon or therapist, typically at least 4-6 weeks.  POSTOPERATIVE CONSTIPATION PROTOCOL Constipation - defined medically as fewer than three stools per week and severe constipation as less than one stool per week.  One of the most common issues patients have following surgery is constipation.  Even if you have a regular bowel pattern at home, your normal regimen is likely to be disrupted due to multiple reasons following surgery.  Combination of anesthesia, postoperative narcotics, change in appetite and fluid intake all can affect your bowels.  In order to avoid complications following surgery, here are some recommendations in order to help you during your recovery period.  Colace (docusate) - Pick up an over-the-counter form of Colace or another stool softener and  take twice a day as long as you are requiring postoperative pain medications.  Take with a full glass of water daily.  If you experience loose stools or diarrhea, hold the colace until you stool forms back up.  If your symptoms do not get better within 1 week or if they get worse, check with your doctor.  Dulcolax (bisacodyl) - Pick up over-the-counter and take as directed by the product packaging as needed to assist with the movement of your bowels.  Take with a full glass of water.  Use this product as needed if not relieved by Colace only.   MiraLax (polyethylene glycol) - Pick up over-the-counter to  have on hand.  MiraLax is a solution that will increase the amount of water in your bowels to assist with bowel movements.  Take as directed and can mix with a glass of water, juice, soda, coffee, or tea.  Take if you go more than two days without a movement. Do not use MiraLax more than once per day. Call your doctor if you are still constipated or irregular after using this medication for 7 days in a row.  If you continue to have problems with postoperative constipation, please contact the office for further assistance and recommendations.  If you experience "the worst abdominal pain ever" or develop nausea or vomiting, please contact the office immediatly for further recommendations for treatment.  ITCHING  If you experience itching with your medications, try taking only a single pain pill, or even half a pain pill at a time.  You can also use Benadryl over the counter for itching or also to help with sleep.   TED HOSE STOCKINGS Wear the elastic stockings on both legs for three weeks following surgery during the day but you may remove then at night for sleeping.  MEDICATIONS See your medication summary on the "After Visit Summary" that the nursing staff will review with you prior to discharge.  You may have some home medications which will be placed on hold until you complete the course of blood  thinner medication.  It is important for you to complete the blood thinner medication as prescribed by your surgeon.  Continue your approved medications as instructed at time of discharge.  PRECAUTIONS If you experience chest pain or shortness of breath - call 911 immediately for transfer to the hospital emergency department.  If you develop a fever greater that 101 F, purulent drainage from wound, increased redness or drainage from wound, foul odor from the wound/dressing, or calf pain - CONTACT YOUR SURGEON.                                                   FOLLOW-UP APPOINTMENTS Make sure you keep all of your appointments after your operation with your surgeon and caregivers. You should call the office at the above phone number and make an appointment for approximately two weeks after the date of your surgery or on the date instructed by your surgeon outlined in the "After Visit Summary".   RANGE OF MOTION AND STRENGTHENING EXERCISES  Rehabilitation of the knee is important following a knee injury or an operation. After just a few days of immobilization, the muscles of the thigh which control the knee become weakened and shrink (atrophy). Knee exercises are designed to build up the tone and strength of the thigh muscles and to improve knee motion. Often times heat used for twenty to thirty minutes before working out will loosen up your tissues and help with improving the range of motion but do not use heat for the first two weeks following surgery. These exercises can be done on a training (exercise) mat, on the floor, on a table or on a bed. Use what ever works the best and is most comfortable for you Knee exercises include:  Leg Lifts - While your knee is still immobilized in a splint or cast, you can do straight leg raises. Lift the leg to 60 degrees, hold for 3 sec, and slowly lower the leg.  Repeat 10-20 times 2-3 times daily. Perform this exercise against resistance later as your knee gets  better.  Quad and Hamstring Sets - Tighten up the muscle on the front of the thigh (Quad) and hold for 5-10 sec. Repeat this 10-20 times hourly. Hamstring sets are done by pushing the foot backward against an object and holding for 5-10 sec. Repeat as with quad sets.  Leg Slides: Lying on your back, slowly slide your foot toward your buttocks, bending your knee up off the floor (only go as far as is comfortable). Then slowly slide your foot back down until your leg is flat on the floor again. Angel Wings: Lying on your back spread your legs to the side as far apart as you can without causing discomfort.  A rehabilitation program following serious knee injuries can speed recovery and prevent re-injury in the future due to weakened muscles. Contact your doctor or a physical therapist for more information on knee rehabilitation.   IF YOU ARE TRANSFERRED TO A SKILLED REHAB FACILITY If the patient is transferred to a skilled rehab facility following release from the hospital, a list of the current medications will be sent to the facility for the patient to continue.  When discharged from the skilled rehab facility, please have the facility set up the patient's [place] prior to being released. Also, the skilled facility will be responsible for providing the patient with their medications at time of release from the facility to include their pain medication, the muscle relaxants, and their blood thinner medication. If the patient is still at the rehab facility at time of the two week follow up appointment, the skilled rehab facility will also need to assist the patient in arranging follow up appointment in our office and any transportation needs.  MAKE SURE YOU:  Understand these instructions.  Get help right away if you are not doing well or get worse.    Pick up stool softner and laxative for home use following surgery while on pain medications. Do not submerge incision under  water. Please use good hand washing techniques while changing dressing each day. May shower starting three days after surgery. Please use a clean towel to pat the incision dry following showers. Continue to use ice for pain and swelling after surgery. Do not use any lotions or creams on the incision until instructed by your surgeon.   Do not put a pillow under the knee. Place it under the heel.   Complete by:  As directed    Driving restrictions   Complete by:  As directed    No driving for two weeks   TED hose   Complete by:  As directed    Use stockings (TED hose) for three weeks on both leg(s).  You may remove them at night for sleeping.   Weight bearing as tolerated   Complete by:  As directed      Allergies as of 01/06/2019   No Known Allergies     Medication List    STOP taking these medications   Biotin 5 MG Caps   naproxen sodium 220 MG tablet Commonly known as:  ALEVE   Vitamin D3 50 MCG (2000 UT) capsule   Womens Multivitamin Tabs     TAKE these medications   amoxicillin 500 MG capsule Commonly known as:  AMOXIL Take 2,000 mg by mouth See admin instructions. Take 2000 mg 1 hour prior to dental work   diphenhydrAMINE 25 MG tablet Commonly known  as:  BENADRYL Take 25 mg by mouth daily as needed for itching.   furosemide 20 MG tablet Commonly known as:  LASIX Take 20 mg by mouth every morning.   methocarbamol 500 MG tablet Commonly known as:  ROBAXIN Take 1 tablet (500 mg total) by mouth every 6 (six) hours as needed for muscle spasms.   omeprazole 20 MG capsule Commonly known as:  PRILOSEC Take 20 mg by mouth daily.   Omeprazole-Sodium Bicarbonate 20-1100 MG Caps capsule Commonly known as:  ZEGERID Take 1 capsule by mouth daily before breakfast.   oxyCODONE 5 MG immediate release tablet Commonly known as:  Oxy IR/ROXICODONE Take 1-2 tablets (5-10 mg total) by mouth every 6 (six) hours as needed for severe pain.   potassium chloride 10 MEQ  tablet Commonly known as:  K-DUR Take 10 mEq by mouth every other day.   raloxifene 60 MG tablet Commonly known as:  EVISTA Take 60 mg by mouth daily with breakfast.   rivaroxaban 10 MG Tabs tablet Commonly known as:  XARELTO Take 1 tablet (10 mg total) by mouth daily with breakfast for 20 days. Then take one 81 mg aspirin once a day for three weeks. Then discontinue aspirin.   Synthroid 88 MCG tablet Generic drug:  levothyroxine Take 88 mcg by mouth daily before breakfast.   traMADol 50 MG tablet Commonly known as:  ULTRAM Take 1-2 tablets (50-100 mg total) by mouth every 6 (six) hours as needed for moderate pain.            Discharge Care Instructions  (From admission, onward)         Start     Ordered   01/05/19 0000  Weight bearing as tolerated     01/05/19 0727   01/05/19 0000  Change dressing    Comments:  Change dressing on Friday, then change the dressing daily with sterile 4 x 4 inch gauze dressing and apply TED hose.   01/05/19 1027         Follow-up Information    Gaynelle Arabian, MD. Schedule an appointment as soon as possible for a visit on 01/17/2019.   Specialty:  Orthopedic Surgery Contact information: [address] [place] [address] [phone]           Signed: Ardeen Jourdain, PA-C Orthopaedic Surgery 01/09/2019, 8:32 AM

## 2019-01-31 NOTE — Telephone Encounter (Signed)
Opened in error

## 2019-06-28 ENCOUNTER — Encounter: Payer: Self-pay | Admitting: Gynecologic Oncology

## 2019-06-28 ENCOUNTER — Other Ambulatory Visit: Payer: Self-pay

## 2019-06-28 ENCOUNTER — Inpatient Hospital Stay: Payer: Medicare Other | Attending: Gynecologic Oncology | Admitting: Gynecologic Oncology

## 2019-06-28 VITALS — BP 160/61 | HR 66 | Temp 98.3°F | Resp 20 | Ht 66.0 in | Wt 170.4 lb

## 2019-06-28 DIAGNOSIS — M810 Age-related osteoporosis without current pathological fracture: Secondary | ICD-10-CM | POA: Insufficient documentation

## 2019-06-28 DIAGNOSIS — Z08 Encounter for follow-up examination after completed treatment for malignant neoplasm: Secondary | ICD-10-CM

## 2019-06-28 DIAGNOSIS — C541 Malignant neoplasm of endometrium: Secondary | ICD-10-CM

## 2019-06-28 DIAGNOSIS — E0500 Thyrotoxicosis with diffuse goiter without thyrotoxic crisis or storm: Secondary | ICD-10-CM | POA: Insufficient documentation

## 2019-06-28 DIAGNOSIS — Z923 Personal history of irradiation: Secondary | ICD-10-CM | POA: Diagnosis not present

## 2019-06-28 DIAGNOSIS — I10 Essential (primary) hypertension: Secondary | ICD-10-CM | POA: Insufficient documentation

## 2019-06-28 DIAGNOSIS — Z8542 Personal history of malignant neoplasm of other parts of uterus: Secondary | ICD-10-CM | POA: Insufficient documentation

## 2019-06-28 DIAGNOSIS — Z90710 Acquired absence of both cervix and uterus: Secondary | ICD-10-CM

## 2019-06-28 DIAGNOSIS — Z90722 Acquired absence of ovaries, bilateral: Secondary | ICD-10-CM | POA: Insufficient documentation

## 2019-06-28 DIAGNOSIS — M419 Scoliosis, unspecified: Secondary | ICD-10-CM | POA: Diagnosis not present

## 2019-06-28 NOTE — Patient Instructions (Signed)
You have now completed your surveillance period for endometrial cancer with no evidence of recurrence.  Your cancer was diagnosed in June 2015. You underwent staging for the cancer with a robotic total hysterectomy with removal of both tubes and ovaries, and lymphadenectomy performed on January 30, 2014 at [facility] in [place].  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  Due to the invasion of the muscle wall of 65% a recommendation was made for radiation to the vagina.  You completed 5 treatments of vaginal radiation in November 2015.  Since that time there had been no evidence of recurrence of your cancer on examination.  Should you have any questions concerning your cancer diagnosis or any concerning symptoms for recurrence, particularly pelvic pains, lower extremity swelling, or bleeding, please contact Dr. Serita Grit office at [phone].

## 2019-06-28 NOTE — Progress Notes (Signed)
GYN ONC FOLLOWUP NOTE  Assessment:    78 y.o. 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   S/p robotic hysterectomy, BSO, lymphadenectomy on 01/30/14 in [place]. No LVSI, 65% myometrial invasion, negative pelvic washings and negative lymph nodes. S/p adjuvant vaginal brachytherapy in November 2015. No evidence of recurrence on surveillance examination.  Plan: She has completed surveillance now that she is 5 years from completion of treatment.  The recommendation is for no further cancer-specific follow-up. She was notified to return to see me if she has new concerning symptoms.    HPI:  Terri Ortega is a 78 y.o. year old woman initially seen in consultation in June, 2015 for grade 1 endometrial cancer.  She then underwent a robotic assisted total laparoscopic hysterectomy, bilateral salpingo-oophorectomy, pelvic and para-aortic lymphadenectomy (with SLN mapping) on 7/91/50 without complications.  Her postoperative course was uncomplicated.  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 She was dispositioned to receive adjuvant vaginal break E therapy which she completed in November of 2015.  Interval Hx:  She is seen today for a followup visit. She denies symptoms concerning for recurrence (including vaginal bleeding, pelvic or abdominal pains, new lower extremity edema, cough, change in bladder or bowel habit, or weight loss). She is sexually active and denies difficulty or discomfort with this. She is continuing to use her vaginal dilator.  She had a colonoscopy in May, 2016 with benign polyps removed.  She had a knee replacement in June, 2020 and is continuing to have difficulty ambulating with this.   No complaints or concerns. She is sexually active.  Review of systems: Constitutional:  She has no weight gain or weight  loss. She has no fever or chills. Eyes: No blurred vision Ears, Nose, Mouth, Throat: no nasal drip Cardiovascular: stable chest and upper back pain and SOB Respiratory:  No shortness of breath, No wheezing or cough Gastrointestinal: She has normal bowel movements without diarrhea or constipation. She denies any nausea or vomiting. She denies blood in her stool or heart burn. Genitourinary:  She denies pelvic pain, pelvic pressure or changes in her urinary function. She has no hematuria, dysuria, or incontinence. She has no irregular vaginal bleeding or vaginal discharge Musculoskeletal: Denies muscle weakness or joint pains.  Skin:  She has no skin changes, rashes or itching Neurological:  Denies dizziness or headaches. No neuropathy, no numbness or tingling. Psychiatric:  She denies depression or anxiety. Hematologic/Lymphatic:   No easy bruising or bleeding  No Known Allergies   Current Outpatient Medications on File Prior to Visit  Medication Sig Dispense Refill  . amoxicillin (AMOXIL) 500 MG capsule Take 2,000 mg by mouth See admin instructions. Take 2000 mg 1 hour prior to dental work    . diphenhydrAMINE (BENADRYL) 25 MG tablet Take 25 mg by mouth daily as needed for itching.    . furosemide (LASIX) 20 MG tablet Take 20 mg by mouth every morning.     Marland Kitchen levothyroxine (SYNTHROID) 88 MCG tablet Take 88 mcg by mouth daily before breakfast.     . methocarbamol (ROBAXIN) 500 MG tablet Take 1 tablet (500 mg total) by mouth every 6 (six) hours as needed for muscle spasms. 40 tablet 0  . omeprazole (PRILOSEC) 20 MG capsule Take 20 mg by mouth daily.    Marland Kitchen oxyCODONE (OXY IR/ROXICODONE) 5 MG immediate release tablet  Take 1-2 tablets (5-10 mg total) by mouth every 6 (six) hours as needed for severe pain. 56 tablet 0  . potassium chloride (K-DUR) 10 MEQ tablet Take 10 mEq by mouth every other day.     . raloxifene (EVISTA) 60 MG tablet Take 60 mg by mouth daily with breakfast.     . traMADol  (ULTRAM) 50 MG tablet Take 1-2 tablets (50-100 mg total) by mouth every 6 (six) hours as needed for moderate pain. 40 tablet 0   No current facility-administered medications on file prior to visit.      Past Medical History:  Diagnosis Date  . Graves disease   . HTN (hypertension)   . Osteoporosis   . Radiation 03/20/14, 03/27/14, 04/05/14, 04/12/14, 04/17/14   bracytherapy to proximal vagina 30 gray  . Scoliosis   . Uterine cancer ([place])    surgery and radiation x 5 treatments-last 9'15   Past Surgical History:  Procedure Laterality Date  . BUNIONECTOMY     left  . CATARACT EXTRACTION, BILATERAL Bilateral    last done 11-21-14  . COLONOSCOPY WITH PROPOFOL N/A 12/25/2014   Procedure: COLONOSCOPY WITH PROPOFOL;  Surgeon: Garlan Fair, MD;  Location: [facility];  Service: Endoscopy;  Laterality: N/A;  . JOINT REPLACEMENT     RTKA  . ROBOTIC ASSISTED TOTAL HYSTERECTOMY WITH BILATERAL SALPINGO OOPHERECTOMY  01/30/14   with lymph node biopsy  . TOTAL KNEE ARTHROPLASTY     right  . TOTAL KNEE REVISION Right 01/04/2019   Procedure: TOTAL KNEE REVISION;  Surgeon: Gaynelle Arabian, MD;  Location: [facility];  Service: Orthopedics;  Laterality: Right;  117mn with block   Social History   Socioeconomic History  . Marital status: Married    Spouse name: Not on file  . Number of children: 3  . Years of education: Not on file  . Highest education level: Not on file  Occupational History  . Occupation: aChartered certified accountant Social Needs  . Financial resource strain: Not on file  . Food insecurity    Worry: Not on file    Inability: Not on file  . Transportation needs    Medical: Not on file    Non-medical: Not on file  Tobacco Use  . Smoking status: Never Smoker  . Smokeless tobacco: Never Used  Substance and Sexual Activity  . Alcohol use: No  . Drug use: No  . Sexual activity: Not Currently  Lifestyle  . Physical activity    Days per week: Not on file    Minutes per  session: Not on file  . Stress: Not on file  Relationships  . Social c[place] phone: Not on file    Gets together: Not on file    Attends religious service: Not on file    Active member of club or organization: Not on file    Attends meetings of clubs or organizations: Not on file    Relationship status: Not on file  . Intimate partner violence    Fear of current or ex partner: Not on file    Emotionally abused: Not on file    Physically abused: Not on file    Forced sexual activity: Not on file  Other Topics Concern  . Not on file  Social History Narrative   Lives at home with husband.  Four grand and four great grands.     Family History  Problem Relation Age of Onset  . Heart disease Mother 651  No details    Oncology History Overview Note  Stage IB grade 1 endometrioid endometrial cancer treated with robotic staging on 01/30/14. Patient met high/intermed risk factors based on outer half (65%) myometrial invasion in a woman older than 70. Dispositioned to receive adjuvant vaginal brachytherapy to reduce the risk for recurrence at the vaginal cuff.   Endometrial cancer ([place])  12/29/2013 Initial Diagnosis   Endometrial cancer. Grade 1. Hysteroscopy D&C   01/30/2014 Surgery   robotic hyst, bso, pelvic and PA node dissection at [facility] with Dr Nancy Marus      Physical Exam: Blood pressure (!) 160/61, pulse 66, temperature 98.3 F (36.8 C), temperature source Temporal, resp. rate 20, height '5\' 6"'  (1.676 m), weight 170 lb 6 oz (77.3 kg), SpO2 99 %. General: Well dressed, well nourished in no apparent distress.   HEENT:  Normocephalic and atraumatic, no lesions.  Extraocular muscles intact. Sclerae anicteric. Pupils equal, round, reactive. No mouth sores or ulcers. Thyroid is normal size, not nodular, midline. Skin:  No lesions or rashes. Breasts:  deferred Lungs:  deferred Cardiovascular:  deferred Abdomen:  Soft, nontender, nondistended.  No  palpable masses.  No hepatosplenomegaly.  No ascites. Normal bowel sounds.  No hernias.  Incisions are completely healed. Genitourinary: Normal EGBUS  Vaginal cuff intact.  No bleeding or discharge.  No cul de sac fullness. Mild radiation changes to mucosa. Extremities: No cyanosis, clubbing or edema.  No calf tenderness or erythema. No palpable cords. Psychiatric: Mood and affect are appropriate. Neurological: Awake, alert and oriented x 3. Sensation is intact, no neuropathy.  Musculoskeletal: No pain, normal strength and range of motion.  Thereasa Solo, MD

## 2019-09-05 NOTE — Progress Notes (Signed)
[facility]    Provider:  Dr Jaynee Eagles Requesting Provider: [name], MD Primary Care Provider:  [name], MD  CC:  Memory loss  HPI:  Terri Ortega is a 79 y.o. female here as requested by [name], MD for memory loss. PMHx high blood pressure, uterine cancer, osteoporosis, hypothyroidism, memory difficulties, diastolic congestive heart failure, overweight.  I reviewed notes from Dr. Delfina Redwood, B12 458, TSH 4.27.  Patient recently presented for memory evaluation, patient considering better memory, she scored 26 out of 30 on the Mini-Mental status exam, I am not sure but the note states possibly she has been part of a financial scheme or the victim I am not sure, she could not recall any other specific issues with her memory, she did report being under a great deal expressed about the election in the pandemic.  I reviewed the report of a head CT from 2004, patient had a fall, head CT without contrast, no evidence of fracture or intracranial hemorrhage, mild atrophy, overall unremarkable per report (too old to be able to access images). She is here with her daughter who also provides much information. She had knee surgery in 01/2019 and she has improved but it is still hard for her, her knee hurts when she walks. She is very independent, she drives to the grocery store, still driving well, no falls, she does not think her memory has been a problem even since getting anesthesia, she feels her husband's memory problems are so obvious and he knows it, it is frustrating for her husband and she notices it, moreso names of people and she notices it, she hekps him keep appointments. They visit with family and grandchildren, she still enjoys socializing, seeing family, its hard with Covid and isolation and they can't see relatives such as her sister in law who is close by but can't spend time together. They grow collards in their garden. She does notice some memory changes in  herself. Daughter says they feel patient's logic is impaired and some memory, the logic is impaired, she still texts but she cn get mixed up sometime, she had a scammer call her on the phone and a few years ago she gave $300 to someone she had no idea, to pay someone to keep her computer updated, he called again and fortunately she did not lose anything again and she gave up talking to him and went to her bank and told them what had happened and they pulled up her account. The week before last she started to pull up the [employer] site for her husband she pays the bills and suddenly on her monitor she saw menus she called the number and he wanted $200 and she declined. Her mother died young, she was a foster child, she doesn't know her father. She likes to take a nap in  The afternoon. Discussed POA and HCPOA. She snores heavily, she drools on her pillow, she denies vivid dreams, no shaking, no shuffling, no depression or anxiety.   Reviewed notes, labs and imaging from outside physicians, which showed:   See above  Review of Systems: Patient complains of symptoms per HPI as well as the following symptoms: memory loss, knee pain. Pertinent negatives and positives per HPI. All others negative.   Social History   Socioeconomic History  . Marital status: Married    Spouse name: Not on file  . Number of children: 3  . Years of education: Not on file  . Highest education  level: High school graduate  Occupational History  . Occupation: Chartered certified accountant  Tobacco Use  . Smoking status: Never Smoker  . Smokeless tobacco: Never Used  Substance and Sexual Activity  . Alcohol use: Never  . Drug use: Never  . Sexual activity: Not Currently  Other Topics Concern  . Not on file  Social History Narrative   Lives at home with husband, Rush [place].  Four grand and five great grands.     Caffeine: diet coke 3/week   Social Determinants of Health   Financial Resource Strain:   . Difficulty of Paying  Living Expenses: Not on file  Food Insecurity:   . Worried About Charity fundraiser in the Last Year: Not on file  . Ran Out of Food in the Last Year: Not on file  Transportation Needs:   . Lack of Transportation (Medical): Not on file  . Lack of Transportation (Non-Medical): Not on file  Physical Activity:   . Days of Exercise per Week: Not on file  . Minutes of Exercise per Session: Not on file  Stress:   . Feeling of Stress : Not on file  Social Connections:   . Frequency of Communication with Friends and Family: Not on file  . Frequency of Social Gatherings with Friends and Family: Not on file  . Attends Religious Services: Not on file  . Active Member of Clubs or Organizations: Not on file  . Attends Archivist Meetings: Not on file  . Marital Status: Not on file  Intimate Partner Violence:   . Fear of Current or Ex-Partner: Not on file  . Emotionally Abused: Not on file  . Physically Abused: Not on file  . Sexually Abused: Not on file    Family History  Adopted: Yes  Problem Relation Age of Onset  . Heart disease Mother 7       No details    Past Medical History:  Diagnosis Date  . Graves disease   . HTN (hypertension)   . Low back pain    Dr. Ronnald Ramp  . Osteoporosis   . Pneumonia 2012  . Radiation 03/20/14, 03/27/14, 04/05/14, 04/12/14, 04/17/14   bracytherapy to proximal vagina 30 gray  . Scoliosis   . Uterine cancer ([place])    surgery and radiation x 5 treatments-last 9'15  . Vitamin D deficiency     Patient Active Problem List   Diagnosis Date Noted  . Cognitive change 09/06/2019  . 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 Preop cardiovascular exam 12/20/2018  . 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 Uterine cancer ([place]) 03/28/2018  . Arthralgia of hip or thigh 03/28/2018  . Benign essential HTN 03/28/2018  . Overweight 03/28/2018  . Heart disease 11/09/2014  .  Leukocytosis 10/31/2014  . Diastolic congestive heart failure ([place]) 10/30/2014  . Hypothyroidism 10/30/2014  . Chest pain at rest 10/30/2014  . Chest pain 10/30/2014  . Endometrial cancer ([place]) 01/30/2014  . Dyspnea 01/11/2014  . Edema 01/11/2014  . Low back pain with right-sided sciatica 12/13/2013  . Degeneration of lumbar or lumbosacral intervertebral disc 12/13/2013  . Sciatica 12/13/2013    Past Surgical History:  Procedure Laterality Date  . BUNIONECTOMY     left  . CATARACT EXTRACTION, BILATERAL Bilateral    last done 11-21-14  . COLONOSCOPY WITH PROPOFOL N/A 12/25/2014   Procedure: COLONOSCOPY WITH PROPOFOL;  Surgeon: Garlan Fair, MD;  Location: [facility];  Service: Endoscopy;  Laterality: N/A;  . FOOT SURGERY Left 2004   Dr Johnnye Sima  . JOINT REPLACEMENT     RTKA  . ROBOTIC ASSISTED TOTAL HYSTERECTOMY WITH BILATERAL SALPINGO OOPHERECTOMY  01/30/14   with lymph node biopsy  . TOTAL KNEE ARTHROPLASTY     right  . TOTAL KNEE REVISION Right 01/04/2019   Procedure: TOTAL KNEE REVISION;  Surgeon: Gaynelle Arabian, MD;  Location: [facility];  Service: Orthopedics;  Laterality: Right;  143min with block    Current Outpatient Medications  Medication Sig Dispense Refill  . amoxicillin (AMOXIL) 500 MG capsule Take 2,000 mg by mouth See admin instructions. Take 2000 mg 1 hour prior to dental work    . Aspirin Effervescent (ALKA-SELTZER PO) Take by mouth.    . Cholecalciferol (VITAMIN D3 PO) Take 2,000 Int'l Units by mouth daily.    . furosemide (LASIX) 20 MG tablet Take 20 mg by mouth every morning.     Marland Kitchen levothyroxine (SYNTHROID) 88 MCG tablet Take 88 mcg by mouth daily before breakfast.     . Multiple Vitamins-Minerals (CENTRUM SILVER PO) Take by mouth.    . Naproxen Sodium (ALEVE PO) Take 220 mg by mouth as needed.    Marland Kitchen OVER THE COUNTER MEDICATION Tums    . potassium chloride (K-DUR) 10 MEQ tablet Take 10 mEq by mouth every other day.     . raloxifene (EVISTA) 60 MG tablet  Take 60 mg by mouth daily with breakfast.      No current facility-administered medications for this visit.    Allergies as of 09/06/2019 - Review Complete 09/06/2019  Allergen Reaction Noted  . Percocet [oxycodone-acetaminophen] Nausea And Vomiting 09/06/2019    Vitals: BP (!) 143/77 (BP Location: Right Arm, Patient Position: Sitting)   Pulse 61   Temp (!) 96.9 F (36.1 C) Comment: daughter 97.1  Ht 5\' 5"  (1.651 m)   Wt 177 lb (80.3 kg)   BMI 29.45 kg/m  Last Weight:  Wt Readings from Last 1 Encounters:  09/06/19 177 lb (80.3 kg)   Last Height:   Ht Readings from Last 1 Encounters:  09/06/19 5\' 5"  (1.651 m)     Physical exam: Exam: Gen: NAD, conversant, well nourised, obese, well groomed                     CV: RRR, no MRG. No Carotid Bruits. No peripheral edema, warm, nontender Eyes: Conjunctivae clear without exudates or hemorrhage  Neuro: Detailed Neurologic Exam  Speech:    Speech is normal; fluent and spontaneous with normal comprehension.  Cognition:  MMSE - Mini Mental State Exam 09/06/2019  Orientation to time 5  Orientation to Place 5  Registration 3  Attention/ Calculation 2  Recall 3  Language- name 2 objects 2  Language- repeat 1  Language- follow 3 step command 3  Language- read & follow direction 1  Write a sentence 1  Copy design 0  Total score 26       The patient is oriented to person, place, and time;     recent and remote memory intact;     language fluent;     normal attention, concentration,     fund of knowledge and judgement impaired Cranial Nerves:    The pupils are equal, round, and reactive to light.  Attempted funduscopy pupils to small visual fields are full to finger confrontation. Extraocular movements are intact. Trigeminal sensation is intact and the muscles of mastication are normal. The face  is symmetric. The palate elevates in the midline. Hearing intact. Voice is normal. Shoulder shrug is normal. The tongue has normal  motion without fasciculations.   Coordination:    Normal finger to nose.  Gait:    Heel-toe and tandem gait with imbalance, antalgic due to knee pain  Motor Observation:    No asymmetry, no atrophy, and no involuntary movements noted. Tone:    Normal muscle tone.    Posture:    Posture is normal. normal erect    Strength:    Strength is V/V in the upper and lower limbs.      Sensation: intact to LT     Reflex Exam:  DTR's:    Deep tendon reflexes in the upper and lower extremities are symmetrical and 1+-2+ bilaterally.   Toes:    The toes are equiv bilaterally.   Clonus:    Clonus is absent.    Assessment/Plan: This is an extremely lovely 79 year old patient here with her equally lovely daughter for concerns of memory loss or more appropriately possibly poor judgment.  She is 79 years old, she is quite independent, she still drives, she lives with her husband, however I am getting concerned when I hear things like she has been the victim of skimmer, I do think possibly she has some impaired judgment and insight, at this time daughters concerned and I do think she should have formal evaluation including MRI of the brain, formal neurocognitive testing, also daughter is concerned about sleeping and whether sleep apnea may be having an effect, patient denies symptoms but I am not quite sure she is a good historian, daughter is worried about her excessive snoring and patient takes a nap during the day we will send her to Dr. Brett Fairy. Her son-in-lase is Mardene Sayer, a pcp in this area whose mother suffered from dementia and he is concerned about his mother in law who is our patient.  - MRI of the brain for reversible causes of dementia - Formal memory testing for prodromal Alzheiemer's disease PT for balance due to knee injury and decredsed activity due to Covid, she is a fall risk, home health PT (she cannot drive independently long distances due to cognitive problems): PT gait  evaluation and treatment specifically balance exercises - Based on results can consider starting Aricept   Orders Placed This Encounter  Procedures  . MR BRAIN W WO CONTRAST  . Ambulatory referral to Neuropsychology  . Ambulatory referral to Home Health    Cc: [name], MD  Sarina Ill, MD  [facility] [address] [place], [place] [address]  Phone [phone] Fax [phone]

## 2019-09-06 ENCOUNTER — Encounter: Payer: Self-pay | Admitting: Neurology

## 2019-09-06 ENCOUNTER — Ambulatory Visit: Payer: Medicare PPO | Admitting: Neurology

## 2019-09-06 ENCOUNTER — Other Ambulatory Visit: Payer: Self-pay

## 2019-09-06 VITALS — BP 143/77 | HR 61 | Temp 96.9°F | Ht 65.0 in | Wt 177.0 lb

## 2019-09-06 DIAGNOSIS — R4189 Other symptoms and signs involving cognitive functions and awareness: Secondary | ICD-10-CM | POA: Diagnosis not present

## 2019-09-06 DIAGNOSIS — R413 Other amnesia: Secondary | ICD-10-CM | POA: Diagnosis not present

## 2019-09-06 DIAGNOSIS — G3184 Mild cognitive impairment, so stated: Secondary | ICD-10-CM | POA: Insufficient documentation

## 2019-09-06 DIAGNOSIS — R410 Disorientation, unspecified: Secondary | ICD-10-CM | POA: Diagnosis not present

## 2019-09-06 DIAGNOSIS — R2689 Other abnormalities of gait and mobility: Secondary | ICD-10-CM | POA: Diagnosis not present

## 2019-09-06 DIAGNOSIS — F0390 Unspecified dementia without behavioral disturbance: Secondary | ICD-10-CM | POA: Insufficient documentation

## 2019-09-06 NOTE — Patient Instructions (Addendum)
MRI of the brain Sleep evaluation Formal memory testing   Fall Prevention in the Home, Adult Falls can cause injuries. They can happen to people of all ages. There are many things you can do to make your home safe and to help prevent falls. Ask for help when making these changes, if needed. What actions can I take to prevent falls? General Instructions  Use good lighting in all rooms. Replace any light bulbs that burn out.  Turn on the lights when you go into a dark area. Use night-lights.  Keep items that you use often in easy-to-reach places. Lower the shelves around your home if necessary.  Set up your furniture so you have a clear path. Avoid moving your furniture around.  Do not have throw rugs and other things on the floor that can make you trip.  Avoid walking on wet floors.  If any of your floors are uneven, fix them.  Add color or contrast paint or tape to clearly mark and help you see: ? Any grab bars or handrails. ? First and last steps of stairways. ? Where the edge of each step is.  If you use a stepladder: ? Make sure that it is fully opened. Do not climb a closed stepladder. ? Make sure that both sides of the stepladder are locked into place. ? Ask someone to hold the stepladder for you while you use it.  If there are any pets around you, be aware of where they are. What can I do in the bathroom?      Keep the floor dry. Clean up any water that spills onto the floor as soon as it happens.  Remove soap buildup in the tub or shower regularly.  Use non-skid mats or decals on the floor of the tub or shower.  Attach bath mats securely with double-sided, non-slip rug tape.  If you need to sit down in the shower, use a plastic, non-slip stool.  Install grab bars by the toilet and in the tub and shower. Do not use towel bars as grab bars. What can I do in the bedroom?  Make sure that you have a light by your bed that is easy to reach.  Do not use any  sheets or blankets that are too big for your bed. They should not hang down onto the floor.  Have a firm chair that has side arms. You can use this for support while you get dressed. What can I do in the kitchen?  Clean up any spills right away.  If you need to reach something above you, use a strong step stool that has a grab bar.  Keep electrical cords out of the way.  Do not use floor polish or wax that makes floors slippery. If you must use wax, use non-skid floor wax. What can I do with my stairs?  Do not leave any items on the stairs.  Make sure that you have a light switch at the top of the stairs and the bottom of the stairs. If you do not have them, ask someone to add them for you.  Make sure that there are handrails on both sides of the stairs, and use them. Fix handrails that are broken or loose. Make sure that handrails are as long as the stairways.  Install non-slip stair treads on all stairs in your home.  Avoid having throw rugs at the top or bottom of the stairs. If you do have throw rugs, attach them to  the floor with carpet tape.  Choose a carpet that does not hide the edge of the steps on the stairway.  Check any carpeting to make sure that it is firmly attached to the stairs. Fix any carpet that is loose or worn. What can I do on the outside of my home?  Use bright outdoor lighting.  Regularly fix the edges of walkways and driveways and fix any cracks.  Remove anything that might make you trip as you walk through a door, such as a raised step or threshold.  Trim any bushes or trees on the path to your home.  Regularly check to see if handrails are loose or broken. Make sure that both sides of any steps have handrails.  Install guardrails along the edges of any raised decks and porches.  Clear walking paths of anything that might make someone trip, such as tools or rocks.  Have any leaves, snow, or ice cleared regularly.  Use sand or salt on walking  paths during winter.  Clean up any spills in your garage right away. This includes grease or oil spills. What other actions can I take?  Wear shoes that: ? Have a low heel. Do not wear high heels. ? Have rubber bottoms. ? Are comfortable and fit you well. ? Are closed at the toe. Do not wear open-toe sandals.  Use tools that help you move around (mobility aids) if they are needed. These include: ? Canes. ? Walkers. ? Scooters. ? Crutches.  Review your medicines with your doctor. Some medicines can make you feel dizzy. This can increase your chance of falling. Ask your doctor what other things you can do to help prevent falls. Where to find more information  Centers for Disease Control and Prevention, STEADI: https://garcia.biz/  [employer] on Aging: BrainJudge.co.uk Contact a doctor if:  You are afraid of falling at home.  You feel weak, drowsy, or dizzy at home.  You fall at home. Summary  There are many simple things that you can do to make your home safe and to help prevent falls.  Ways to make your home safe include removing tripping hazards and installing grab bars in the bathroom.  Ask for help when making these changes in your home. This information is not intended to replace advice given to you by your health care provider. Make sure you discuss any questions you have with your health care provider. Document Revised: 11/10/2018 Document Reviewed: 03/04/2017 Elsevier Patient Education  2020 [employer].

## 2019-09-19 ENCOUNTER — Telehealth: Payer: Self-pay | Admitting: Neurology

## 2019-09-19 NOTE — Telephone Encounter (Signed)
Mcarthur Rossetti Josem Kaufmann: [id number] (exp. 09/19/19 to 10/19/19) order sent to GI. They will reach out to the patient to schedule.

## 2019-09-25 ENCOUNTER — Encounter: Payer: Medicare PPO | Attending: Psychology | Admitting: Psychology

## 2019-09-25 ENCOUNTER — Other Ambulatory Visit: Payer: Self-pay

## 2019-09-25 DIAGNOSIS — R4189 Other symptoms and signs involving cognitive functions and awareness: Secondary | ICD-10-CM | POA: Diagnosis not present

## 2019-09-25 DIAGNOSIS — R413 Other amnesia: Secondary | ICD-10-CM | POA: Diagnosis not present

## 2019-09-25 NOTE — Progress Notes (Signed)
Neuropsychological Consultation   Patient:   Terri Ortega   DOB:   Apr 08, 1941  MR Number:  [id number]  Location:  [facility] [place], [place] [id number] [place] [address] Dept: [phone]           Date of Service:   09/25/2019  Start Time:   2 PM End Time:   4 PM  Today's visit was an in person visit that was conducted at my outpatient clinic office.  The patient, her husband and myself were present throughout this visit.  The first hour was utilized in a face-to-face clinical interview with the second hour having to do with records review and report writing.  Provider/Observer:  Ilean Skill, Psy.D.       Clinical Neuropsychologist       Billing Code/Service: [phone], [phone]  Chief Complaint:    SHEREENA STROBLE (friend) is a 79 year old female referred by Dr. Jaynee Eagles for neuropsychological evaluation due to reports of ongoing issues with memory and forgetfulness.  The patient is also had some difficulties with a recent knee surgery that is continuing to give her significant pain.  The patient has a past medical history of high blood pressure, uterine cancer, osteoporosis, hypothyroidism, memory difficulties, diastolic congestive heart failure and overweight.  The patient reports that there have been some changes in memory but no other significant issues with cognitive functioning.  She reports that she has more difficulty remembering people's names or names of places.  The patient's husband supports these symptoms and says he has not noticed other cognitive changes of note.  Reason for Service:  SYANN MCDOUGALD (friend) is a 80 year old female referred by Dr. Jaynee Eagles for neuropsychological evaluation due to reports of ongoing issues with memory and forgetfulness.  The patient is also had some difficulties with a recent knee surgery that is continuing to give her significant  pain.  The patient has a past medical history of high blood pressure, uterine cancer, osteoporosis, hypothyroidism, memory difficulties, diastolic congestive heart failure and overweight.  The patient reports that there have been some changes in memory but no other significant issues with cognitive functioning.  She reports that she has more difficulty remembering people's names or names of places.  The patient's husband supports these symptoms and says he has not noticed other cognitive changes of note.  The patient had a head CT in 2004 after a fall.  There was no evidence of fracture or intracranial hemorrhage but mild atrophy was noted but the CT was relatively unremarkable.  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  There are no indications of fall or other gait changes.  She is remaining actively socially although COVID-19 and social isolation's have been problematic.  Medical records show that there are some concerns for the patient's daughter feel like the patient's logic has been impaired and there are some memory difficulties.  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  She was ultimately able to get out of the issues with a friend who is sophisticated in Engineer, mining but it did take some doing to avoid significant harm from this computer Internet scam.  The patient has had a recent knee replacement and has had some changes in gait with increased pain.  She reports that it is  improving some more recently and she is doing better but she continues to have significant pain.  The patient and her husband deny any significant mood changes and no issues of depression or anxiety.  There are no indications of any geographic disorientation changes although the patient is described by both herself and her husband has never been very good with direction.  The  patient reports that she does have some issues with sleep related to snoring heavily but overall she feels like her sleep pattern is adequate.  The patient has a good appetite and does try to have good nutrition.  The patient and her husband deny any indications of tremor, visual or auditory hallucination or other behavioral or psychiatric changes.  Expressive and receptive language appear to be within normal limits.  Reliability of Information: The information is derived from a review of patient's available medical records as well as a clinical interview with the patient and her husband.  I was not able to talk directly to the patient's daughter but did look at references in the patient's medical record.  Behavioral Observation: SAREA GEMMEL  presents as a 79 y.o.-year-old Right Caucasian Female who appeared her stated age. her dress was Appropriate and she was Well Groomed and her manners were Appropriate to the situation.  her participation was indicative of Appropriate and Attentive behaviors.  There were not any physical disabilities noted other than the patient reporting some continued pain in her knee.  she displayed an appropriate level of cooperation and motivation.     Interactions:    Active Appropriate and Attentive  Attention:   within normal limits and attention span and concentration were age appropriate  Memory:   abnormal; remote memory intact, recent memory impaired  Visuo-spatial:  not examined  Speech (Volume):  normal  Speech:   normal; normal  Thought Process:  Coherent and Relevant  Though Content:  WNL; not suicidal and not homicidal  Orientation:   person, place, time/date and situation  Judgment:   Good  Planning:   Good  Affect:    Appropriate  Mood:    Euphoric  Insight:   Good  Intelligence:   normal  Marital Status/Living: The patient was born and raised in better [facility] with no siblings.  The patient's mother's pregnancy with her was  within normal limits and the patient reached developmental milestones at the appropriate time.  The patient continues to live in her home with her husband and has lived in [place] for the past 11 years and lived in [place] for 45 years prior to that.  The patient and her husband have 2 adult children 1 daughter and 1 son.  They are both described as being in good health.    Current Employment: The patient is retired.  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  Hobbies and interests include playing with her great-grandchildren.  Substance Use:  No concerns of substance abuse are reported.    Education:   HS Graduate  Medical History:   Past Medical History:  Diagnosis Date  . Graves disease   . HTN (hypertension)   . Low back pain    Dr. Ronnald Ramp  . Osteoporosis   . Pneumonia 2012  . Radiation 03/20/14, 03/27/14, 04/05/14, 04/12/14, 04/17/14   bracytherapy to proximal vagina 30 gray  . Scoliosis   . Uterine cancer ([place])    surgery and radiation x 5  treatments-last 9'15  . Vitamin D deficiency        Psychiatric History:  No prior psychiatric history noted.  Family Med/Psych History:  Family History  Adopted: Yes  Problem Relation Age of Onset  . Heart disease Mother 42       No details    Impression/DX:  AZAYLIAH ISABELLE (friend) is a 79 year old female referred by Dr. Jaynee Eagles for neuropsychological evaluation due to reports of ongoing issues with memory and forgetfulness.  The patient is also had some difficulties with a recent knee surgery that is continuing to give her significant pain.  The patient has a past medical history of high blood pressure, uterine cancer, osteoporosis, hypothyroidism, memory difficulties, diastolic congestive heart failure and overweight.  The patient reports that there have been some changes in memory but no other significant issues with cognitive functioning.  She  reports that she has more difficulty remembering people's names or names of places.  The patient's husband supports these symptoms and says he has not noticed other cognitive changes of note.  Disposition/Plan:  We have set the patient up for formal neuropsychological testing utilizing the Wechsler Adult Intelligence Scale-IV as well as the Wechsler Memory Scale-IV.  The patient will also be administered the [place] card sorting test as well as Trail Making Test.  Diagnosis:    Cognitive change  Memory changes         Electronically Signed   _______________________ Ilean Skill, Psy.D.

## 2019-09-29 ENCOUNTER — Ambulatory Visit: Payer: Medicare PPO | Admitting: Psychology

## 2019-10-06 ENCOUNTER — Encounter: Payer: Medicare PPO | Attending: Psychology | Admitting: Psychology

## 2019-10-06 ENCOUNTER — Encounter: Payer: Self-pay | Admitting: Psychology

## 2019-10-06 ENCOUNTER — Other Ambulatory Visit: Payer: Self-pay

## 2019-10-06 DIAGNOSIS — M810 Age-related osteoporosis without current pathological fracture: Secondary | ICD-10-CM | POA: Insufficient documentation

## 2019-10-06 DIAGNOSIS — E039 Hypothyroidism, unspecified: Secondary | ICD-10-CM | POA: Diagnosis not present

## 2019-10-06 DIAGNOSIS — R4189 Other symptoms and signs involving cognitive functions and awareness: Secondary | ICD-10-CM | POA: Diagnosis not present

## 2019-10-06 DIAGNOSIS — I110 Hypertensive heart disease with heart failure: Secondary | ICD-10-CM | POA: Diagnosis not present

## 2019-10-06 DIAGNOSIS — R413 Other amnesia: Secondary | ICD-10-CM | POA: Diagnosis present

## 2019-10-06 DIAGNOSIS — I5030 Unspecified diastolic (congestive) heart failure: Secondary | ICD-10-CM | POA: Insufficient documentation

## 2019-10-06 DIAGNOSIS — R0683 Snoring: Secondary | ICD-10-CM | POA: Diagnosis not present

## 2019-10-06 DIAGNOSIS — Z8542 Personal history of malignant neoplasm of other parts of uterus: Secondary | ICD-10-CM | POA: Insufficient documentation

## 2019-10-06 NOTE — Progress Notes (Addendum)
The patient arrived on time to her 10:00 testing appointment, which lasted 240 minutes.  Behavioral Observations:  Appearance: Casually and appropriately dressed with good hygiene. Gait: Ambulated independently without assistance. Left sided weakness (e.g., dragged left leg) Speech: Clear, normal rate, normal tone & volume. Thought process:  Logical. Perseveration noted.  Mood/Affect: Mildly depressed. Appropriate.  Interpersonal: Polite and appropriate. Orientation: Oriented x 4 Effort/Motivation: Good    She did not appear to have difficulty seeing, hearing, or understanding test items/questions and did not require much additional prompting. She exhibited adequate distress tolerance on questions she did not know or tasks that were more difficult. She appeared somewhat tired and complained of fatigue. She was cooperative with all assigned tasks.   Tests Administered: Marland Kitchen Modified [employer] (M-WCST) . Trail Making Test (Part A & B) . Wechsler Memory Scale, 4th Edition, WMS-IV (Older Adult Battery) . Wechsler Adult Intelligence Scale, 4th Edition, (WAIS-IV)   Results: M-WCST . # Categories=2, T=29, 2nd % . # of Total Errors=31, T=28, 1st % . # Perseverative Errors=28, T=24, <1st % . % Perseverative Errors=0.90, T=31, 3rd % . Executive Function Composite o SS=61, <1st % TMT . Part A o Time=65", T=32, 4th % . Part B o Time=255", T=32, 4th %  WAIS-IV Composite Score Summary  Scale Sum of Scaled Scores Composite Score Percentile Rank 95% Conf. Interval Qualitative Description  Verbal Comprehension 30 VCI 100 50 94-106 Average  Perceptual Reasoning 22 PRI 84 14 79-91 Low Average  Working Memory 15 WMI 86 18 80-94 Low Average  Processing Speed 13 PSI 81 10 75-91 Low Average  Full Scale 80 FSIQ 86 18 82-90 Low Average  General Ability 52 GAI 92 30 87-97 Average   Index Level Discrepancy Comparisons  Comparison Score 1 Score 2 Difference Critical Value .05  Significant Difference Y/N Base Rate by Overall Sample  VCI - PRI 100 84 16 9.29 Y 12.2  VCI - WMI 100 86 14 8.81 Y 14.1  VCI - PSI 100 81 19 10.18 Y 12.5  PRI - WMI 84 86 -2 9.74 N 45.0  PRI - PSI 84 81 3 10.99 N 41.6  WMI - PSI 86 81 5 10.59 N 37.7  FSIQ - GAI 86 92 -6 3.34 Y 12.4   Differences Between Subtest and Overall Mean of Subtest Scores  Subtest Subtest Scaled Score Mean Scaled Score Difference Critical Value .05 Strength or Weakness Base Rate  Block Design 8 8.00 0.00 2.85    Similarities 11 8.00 3.00 2.82 S 10-15%  Digit Span 9 8.00 1.00 2.22  >25%  Matrix Reasoning 7 8.00 -1.00 2.54  >25%  Vocabulary 10 8.00 2.00 2.03  >25%  Arithmetic 6 8.00 -2.00 2.73  >25%  Symbol Search 8 8.00 0.00 3.42    Visual Puzzles 7 8.00 -1.00 2.71  >25%  Information 9 8.00 1.00 2.19  >25%  Coding 5 8.00 -3.00 2.97 W 15-25%   WMS-IV Older Adult Brief Cognitive Status Exam Classification  Age Years of Education Raw Score Classification Level Base Rate  78 years 2 months 13 31 Very Low 0.0   Index Score Summary  Index Sum of Scaled Scores Index Score Percentile Rank 95% Confidence Interval Qualitative Descriptor  Auditory Memory (AMI) 43 104 61 98-110 Average  Visual Memory (VMI) 13 82 12 78-87 Low Average  Immediate Memory (IMI) 27 93 32 87-100 Average  Delayed Memory (DMI) 29 98 45 91-106 Average    Index Score Summary  Index Sum  of Scaled Scores Index Score Percentile Rank 95% Confidence Interval Qualitative Descriptor  Auditory Memory (AMI) 43 104 61 98-110 Average  Visual Memory (VMI) 13 82 12 78-87 Low Average  Immediate Memory (IMI) 27 93 32 87-100 Average  Delayed Memory (DMI) 29 98 45 91-106 Average   Primary Subtest Scaled Score Summary  Subtest Domain Raw Score Scaled Score Percentile Rank  Logical Memory I AM 29 10 50  Logical Memory II AM 16 10 50  Verbal Paired Associates I AM 22 11 63  Verbal [employer] Associates II AM 7 12 75  Visual Reproduction I VM 19 6 9    Visual Reproduction II VM 7 7 16   Symbol Span VWM 7 6 9    Auditory Memory Process Score Summary  Process Score Raw Score Scaled Score Percentile Rank Cumulative Percentage (Base Rate)  LM II Recognition 19 - - 51-75%  VPA II Recognition 28 - - 51-75%   Visual Memory Process Score Summary  Process Score Raw Score Scaled Score Percentile Rank Cumulative Percentage (Base Rate)  VR II Recognition 2 - - 17-25%    ABILITY-MEMORY ANALYSIS  Ability Score:  GAI: 92 Date of Testing:  WAIS-IV; WMS-IV 2019/10/06  Predicted Difference Method   Index Predicted WMS-IV Index Score Actual WMS-IV Index Score Difference Critical Value  Significant Difference Y/N Base Rate  Auditory Memory 96 104 -8 9.16 N   Visual Memory 95 82 13 8.46 Y 15%  Immediate Memory 95 93 2 10.38 N   Delayed Memory 95 98 -3 12.01 N   Statistical significance (critical value) at the .01 level.   Contrast Scaled Scores  Score Score 1 Score 2 Contrast Scaled Score  General Ability Index vs. Auditory Memory Index 92 104 12  General Ability Index vs. Visual Memory Index 92 82 7  General Ability Index vs. Immediate Memory Index 92 93 10  General Ability Index vs. Delayed Memory Index 92 98 10  Verbal Comprehension Index vs. Auditory Memory Index 100 104 11  Perceptual Reasoning Index vs. Visual Memory Index 84 82 8  Working Memory Index vs. Auditory Memory Index 86 104 12

## 2019-10-18 ENCOUNTER — Encounter: Payer: Self-pay | Admitting: Psychology

## 2019-10-18 ENCOUNTER — Encounter (HOSPITAL_BASED_OUTPATIENT_CLINIC_OR_DEPARTMENT_OTHER): Payer: Medicare PPO | Admitting: Psychology

## 2019-10-18 ENCOUNTER — Other Ambulatory Visit: Payer: Self-pay

## 2019-10-18 DIAGNOSIS — R4189 Other symptoms and signs involving cognitive functions and awareness: Secondary | ICD-10-CM

## 2019-10-18 DIAGNOSIS — E039 Hypothyroidism, unspecified: Secondary | ICD-10-CM | POA: Diagnosis not present

## 2019-10-18 DIAGNOSIS — R413 Other amnesia: Secondary | ICD-10-CM

## 2019-10-18 DIAGNOSIS — M810 Age-related osteoporosis without current pathological fracture: Secondary | ICD-10-CM | POA: Diagnosis not present

## 2019-10-18 DIAGNOSIS — Z8542 Personal history of malignant neoplasm of other parts of uterus: Secondary | ICD-10-CM | POA: Diagnosis not present

## 2019-10-18 DIAGNOSIS — I5030 Unspecified diastolic (congestive) heart failure: Secondary | ICD-10-CM | POA: Diagnosis not present

## 2019-10-18 DIAGNOSIS — R0683 Snoring: Secondary | ICD-10-CM | POA: Diagnosis not present

## 2019-10-18 DIAGNOSIS — I110 Hypertensive heart disease with heart failure: Secondary | ICD-10-CM | POA: Diagnosis not present

## 2019-10-18 NOTE — Progress Notes (Signed)
Neuropsychological Evaluation   Patient:  Terri Ortega   DOB: 1941-07-23  MR Number: [id number]  Location: [place] [facility] PHYSICAL MEDICINE AND REHABILITATION [place], [place] [id number] [place] [address] Dept: [phone]  Start: 9 AM End: 10 AM  Provider/Observer:     Edgardo Roys PsyD  Chief Complaint:      Chief Complaint  Patient presents with  . Memory Loss    Reason For Service:     Terri Ortega) is a 79 year old female referred by Dr. Jaynee Eagles for neuropsychological evaluation due to reports of ongoing issues with memory and forgetfulness.  The patient has also had some difficulties with a recent knee surgery that is continuing to give her significant pain.  The patient has a past medical history of high blood pressure, uterine cancer, osteoporosis, hypothyroidism, memory difficulties, diastolic congestive heart failure and overweight.  The patient reports that there have been some changes in memory but no other significant issues with cognitive functioning.  She reports that she has more difficulty remembering people's names or names of places.  The patient's husband supports these description of symptoms and says he has not noticed other cognitive changes of note.  The patient had a head CT in 2004 after a fall.  There was no evidence of fracture or intracranial hemorrhage but mild atrophy was noted but the CT was relatively unremarkable.  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  There are no indications of fall or other gait changes.  She is remaining actively socially although COVID-19 and social isolation's have been problematic.  Medical records show that there are some concerns by the patient's daughter who feels like the patient's logic has been impaired and there are some memory difficulties.  There is a  history several months ago where she gave money to someone who was running a Furn[employer] conserv[employer]/restorer.  She was ultimately able to get out of the issues with a friend who is sophisticated in Engineer, mining but it did take some doing to avoid significant harm from this computer Internet scam.  The patient has had a recent knee replacement and has had some changes in gait with increased pain.  She reports that it is improving some more recently and she is doing better but she continues to have significant pain.  The patient and her husband deny any significant mood changes and no issues of depression or anxiety.  There are no indications of any geographic disorientation changes although the patient is described by both herself and her husband has never been very good with direction.  The patient reports that she does have some issues with sleep related to snoring heavily but overall she feels like her sleep pattern is adequate.  The patient has a good appetite and does try to have good nutrition.  The patient and her husband deny any indications of tremor, visual or auditory hallucination or other behavioral or psychiatric changes.  Expressive and receptive language appear to be within normal limits.  Behavioral Observations: Appearance:Casually and appropriately dressed with good hygiene. Gait:Ambulated independently without assistance. Left sided weakness (e.g., dragged left leg) Speech:Clear, normal rate, normal tone & volume. Thought process: Logical. Perseveration noted.  Mood/Affect:Mildly depressed. Appropriate.  Interpersonal: Polite and appropriate. Orientation: Oriented x 4 Effort/Motivation: Good    She did not appear to have difficulty seeing, hearing, or understanding  test items/questions and did not require much additional prompting. She exhibited adequate distress tolerance on questions she did not know or tasks that were more difficult. She appeared somewhat tired and  complained of fatigue. She was cooperative with all assigned tasks.   Tests Administered:  Modified [employer] (M-WCST)  Trail Making Test (Part A & B)  Wechsler Memory Scale, 4th Edition, WMS-IV (Older Adult Battery) Wechsler Adult Intelligence Scale, 4th Edition, (WAIS-IV)    Test Results:   Initially, an estimation was made as to likely historical/premorbid cognitive functioning to be used as an estimate when assessing and comparing current cognitive performance variables.  Given the patient's educational history and occupational history it is estimated that the patient likely has been performing in the average range as a conservative estimate with possible greater performances in various individual indices.  Composite Score Summary  Scale Sum of Scaled Scores Composite Score Percentile Rank 95% Conf. Interval Qualitative Description  Verbal Comprehension 30 VCI 100 50 94-106 Average  Perceptual Reasoning 22 PRI 84 14 79-91 Low Average  Working Memory 15 WMI 86 18 80-94 Low Average  Processing Speed 13 PSI 81 10 75-91 Low Average  Full Scale 80 FSIQ 86 18 82-90 Low Average  General Ability 52 GAI 92 30 87-97 Average   The patient was initially administered the Wechsler Adult Intelligence Scale-IV.  Global composite measure should not be used as being currently indicative of her true intellectual performance as the patient is identifying and reporting some memory changes that would likely affect some of her performance on various global indices.  The patient produced a full-scale IQ score of 86 which falls at the 18th percentile and is in the low average range.  This performance is likely below historical performance and indicates 1 or more area of cognitive difficulties and dysfunction.  We also calculated the patient's general abilities index score which places less emphasis on measures that are most likely to have acute changes including working memory/auditory  encoding and information processing speed.  The patient produced a general abilities index score of 92 which falls at the 30th percentile and is in the average range and is just slightly below predicted levels based on education and occupational histories.   Verbal Comprehension Subtests Summary  Subtest Raw Score Scaled Score Percentile Rank Reference Group Scaled Score SEM  Similarities 24 11 63 10 1.12  Vocabulary 35 10 50 10 0.73  Information 11 9 37 9 0.73   The patient produced of verbal comprehension index score of 100 which falls at the 50th percentile and is in the average range.  This "hold test" is likely a fair representation of her historical functioning overall.  There is little variability in subtest performances all of which fell in the average range including the patient's verbal reasoning and problem-solving abilities, general vocabulary knowledge and her general fund of information   Perceptual Reasoning Subtests Summary  Subtest Raw Score Scaled Score Percentile Rank Reference Group Scaled Score SEM  Block Design 20 8 25 5  1.27  Matrix Reasoning 6 7 16 3  0.73  Visual Puzzles 7 7 16 5  0.99   The patient produced a perceptual reasoning index score of 84 which falls at the 14th percentile and is in the low average range.  This likely represents some decrease in visual-spatial reasoning and problem-solving abilities.  The patient had mild deficits with regard to visual analysis and organization, and also had mild deficits with regard to visual reasoning and problem-solving  and visual estimation and judgment.   Working Doctor, general practice Raw Score Scaled Score Percentile Rank Reference Group Scaled Score SEM  Digit Span 21 9 37 6 0.79  Arithmetic 8 6 9 5  0.95   The patient produced a working memory index score of 86 which falls at the 18th percentile and is in the low average range.  There was some scatter in subtest performance.  The patient performed in the  average range with regard to pure auditory encoding measures but had some difficulty when asked to do some multi processing and manipulation of this initially encoded information.   Processing Speed Subtests Summary  Subtest Raw Score Scaled Score Percentile Rank Reference Group Scaled Score SEM  Symbol Search 15 8 25 4  1.12  Coding 21 5 5 1  1.12   The patient produced a processing speed index score of 81 which falls at the 10th percentile and is in the low average range.  There was scatter noted on subtest performance with one measure of visual scanning and visual searching being at the lower end of the average range in one of the measures of information processing speed being significantly impaired.  This likely represents some issues of visual scanning and visual searching difficulties when scanning vertically.   Index Score Summary  Index Sum of Scaled Scores Index Score Percentile Rank 95% Confidence Interval Qualitative Descriptor  Auditory Memory (AMI) 43 104 61 98-110 Average  Visual Memory (VMI) 13 82 12 78-87 Low Average  Immediate Memory (IMI) 27 93 32 87-100 Average  Delayed Memory (DMI) 29 98 45 91-106 Average     The patient's encoding abilities for auditory encoding was within normal limits on pure auditory measures from the Wechsler Adult Intelligence Scale.  The patient's visual encoding abilities as assessed by simple span from this current measure produced a scaled score of 6 which falls at the 9th percentile.  The patient is not showing any issues with auditory encoding but is showing visual encoding deficits.  These visual encoding deficits are likely to have a deleterious effect on her visual memory when compared to auditory memory.  The patient was administered the Wechsler Memory Scale-for for older adults.  Breaking the the patient's memory function down between auditory versus visual memory functions the patient produced an auditory memory index score of 104 which  falls at the 61st percentile and is in the average range.  This performance is within expected levels for predictions based on historical/premorbid functioning and there were no indications of specific auditory memory deficits.  The patient produced a visual memory index score of 82 which falls at the 12 percentile and is low average range.  This is indicative of some objective levels of visual memory impairments with visual memory impairments much more significant than any indications of auditory memory deficits.  Breaking performance is down between immediate memory and delayed memory the patient produced an immediate memory index score of 93 which falls at the 32nd percentile and is in the average range.  This only suggest mild deficits with regard to immediate memory and almost all of these difficulties had to do with visual memory with auditory memory within normal limits.  The patient's delayed memory index score of 98 fell at the 45th percentile and was in the average range.  While the patient had difficulty with visual encoding abilities affecting her visual memory the information that was initially learned visually was retained after period of delay quite efficiently and her auditory  delayed memory was quite good.  There were no indications of any loss of information between immediate and delayed timeframes.   ABILITY-MEMORY ANALYSIS  Ability Score:  VCI: 100 Date of Testing:  WAIS-IV; WMS-IV 2019/10/06  Predicted Difference Method   Index Predicted WMS-IV Index Score Actual WMS-IV Index Score Difference Critical Value  Significant Difference Y/N Base Rate  Auditory Memory 100 104 -4 9.39 N   Visual Memory 100 82 18 8.28 Y 10%  Immediate Memory 100 93 7 10.49 N   Delayed Memory 100 98 2 12.08 N   Statistical significance (critical value) at the .01 level.   We then utilized a ability-memory analysis to assess predicted performance versus achieved performance.  In this analysis we  utilized the verbal comprehension index score from the Wechsler Adult Intelligence Scale (VIQ equals 100) to produce an estimation of where she should be performing on various memory indices and this estimation is then compared to actual performance levels.  The patient produced a predicted score for auditory memory of 100 and her actual achieved score was 104 suggesting that auditory memory is quite well preserved with no indication of any auditory memory deficits.  The patient produced an actual visual memory performance of 82 which is 18 points below predicted levels suggesting significant visual memory deficits.  Immediate and delayed memory functions were consistent with predicted levels although slightly below but all of this decrease was accounted for by visual memory issues.  The patient's delayed memory was essentially consistent with predicted levels and there does not appear to be any significant loss of memory once it is actually encoded and organized.    Logical Memory  Score Score 1 Score 2 Contrast Scaled Score  LM II Recognition vs. Delayed Recall 51-75% 10 9  LM Immediate Recall vs. Delayed Recall 10 10 11    Verbal Paired Associates  Score Score 1 Score 2 Contrast Scaled Score  VPA II Recognition vs. Delayed Recall 51-75% 12 12  VPA Immediate Recall vs. Delayed Recall 11 12 11    Visual Reproduction  Score Score 1 Score 2 Contrast Scaled Score  VR II Recognition vs. Delayed Recall 17-25% 7 8  VR Immediate Recall vs. Delayed Recall 6 7 9    We also looked at the patient's performance on free recall versus recognition formats.  The patient did quite well on recognition formats suggesting that information is being retained and available for recall later.  M-WCST  # Categories=2, T=29, 2nd %  # of Total Errors=31, T=28, 1st %  # Perseverative Errors=28, T=24, <1st %  % Perseverative Errors=0.90, T=31, 3rd %  Executive Function Composite ? SS=61, <1st %  The patient was  administered the [place] card sorting test modified addition.  The patient showed moderate deficits with regard to executive functioning for cognitive flexibility and adjusting strategies to new environmental information.  The patient had a significant number of perseverative errors and showed a global composite score for executive functioning that was in the significantly impaired range.  TMT  Part A ? Time=65", T=32, 4th %  Part B ? Time=255", T=32, 4th %  We also administered the Trail Making Test part a and B to assess both information processing speed as well as another measure of visual problem solving and shifting visual attention.  Both of her performances for information processing speed and cognitive shifting were mild to moderately impaired.  Summary of Results:   Overall, the results of the current objective neuropsychological evaluation looking at a wide range  of various cognitive domains as well as a thorough evaluation of memory functions do suggest that the patient has some mild decrease in global cognitive performances.  Almost all of her difficulties fell in the visual-spatial and visual domains.  The patient showed good performance and well retained performances for verbal reasoning and problem-solving, vocabulary knowledge and her general fund of information.  Pure auditory encoding was quite well retained and the patient showed excellent performance for auditory memory and learning.  The patient also showed very good delayed memory performance and information that was initially stored and organized was available after a period of delay with no loss of information.  This was true for both auditory in particular but also for visual memory regarding delayed memory.  The patient showed mild deficits with regard to visual reasoning and problem solving, visual analysis and visual judgment measures, reductions in visual information processing speed, visual encoding abilities, difficulties  with visual attention, shifting of attention and adjusting strategies for visual measures.  Impression/Diagnosis:   The results of the current neuropsychological evaluation when taking in the patient's subjective symptoms and reports along with her husband's, neurological information including brain imaging as well as objective cognitive performances do suggest that the patient is having some mild to moderate loss in cognitive functioning.  However, almost all of these deficits were in the visual domain including difficulties with visual reasoning and problem-solving, visual attention, visual judgment, slowing of visual information processing, executive functions for visual challenges including difficulties with perseverative patterns and shifting of visual attention.  The patient's memory for auditory functioning was actually quite good while her visual memory was impaired.  Most of this is likely related to visual encoding and visual attention deficits making it hard for her to initially process visual information which then makes it difficult for it to be accurately stored.  However, along with her auditory memory if the visual information is able to be stored into short-term memory it was retained and clearly able to be retrieved at a later date.  There was no indication of delayed memory deficits for either visual or auditory information.  As far as diagnostic considerations the patient's cognitive strengths and weaknesses, neurological and medical data and subjective symptoms are not consistent with those typically seen with more common cortical degenerative dementia such as Alzheimer's, Lewy body or other cortical dementias.  There was some pattern of lateralization to performance with almost all of her deficits in the areas of visual processing and visual cognitive functioning.  MRI/CT studies do not suggest any significant structural changes other than mild atrophy that would be consistent with age.   The patient denies any geographic disorientation and has continued to drive without incident.  While it is possible that we are very early in picking up some cognitive changes and the family is concerned identifying some early subjective symptoms the pattern of strengths and weaknesses would not be consistent with any significant degenerative process currently.  The patient is clearly having some cognitive deficits that are almost exclusively related to visual information and primarily right hemisphere functioning.  Given the nature of the cognitive strengths and weaknesses it will be appropriate to reassess the patient in approximately 9 months to determine whether there is further objective findings of cognitive decline and assess the specific areas of change for more definitive diagnostic considerations.  However, at this time the patient does not appear to have any clear pattern consistent with cortical or subcortical mediated degenerative dementia processes.  The patient  is clearly having some right hemisphere dysfunction cortically but these are primarily in right frontal and right parietal lobe functioning.  There do not appear to be any indications of significant left hemispheric dysfunction.  The patient also is describing significant snoring and difficulty with achieving good restful sleep.  It may also be worthwhile having the patient complete a formal sleep study to rule out the possibility of obstructive sleep apnea as the patient may improve in her overall functioning if she turns out to have sleep apnea and it is adequately treated.  There does not appear to be any significant depression or anxiety or other mood related changes.  Patient may benefit from having a formal sleep study performed to rule out obstructive sleep apnea.  Axis I: Cognitive change  Memory changes   Ilean Skill, Psy.D. Neuropsychologist

## 2019-10-23 ENCOUNTER — Ambulatory Visit
Admission: RE | Admit: 2019-10-23 | Discharge: 2019-10-23 | Disposition: A | Discharge status: Home / Self Care | Payer: Medicare PPO | Source: Ambulatory Visit | Attending: Neurology | Admitting: Neurology

## 2019-10-23 ENCOUNTER — Other Ambulatory Visit: Payer: Self-pay

## 2019-10-23 DIAGNOSIS — R413 Other amnesia: Secondary | ICD-10-CM

## 2019-10-23 DIAGNOSIS — R410 Disorientation, unspecified: Secondary | ICD-10-CM

## 2019-10-23 DIAGNOSIS — R4189 Other symptoms and signs involving cognitive functions and awareness: Secondary | ICD-10-CM

## 2019-10-23 DIAGNOSIS — R2689 Other abnormalities of gait and mobility: Secondary | ICD-10-CM

## 2019-10-23 MED ORDER — GADOBENATE DIMEGLUMINE 529 MG/ML IV SOLN
15.0000 mL | Freq: Once | INTRAVENOUS | Status: AC | PRN
  Administered 2019-10-23: 15 mL via INTRAVENOUS

## 2019-10-24 ENCOUNTER — Telehealth: Payer: Self-pay

## 2019-10-26 ENCOUNTER — Encounter: Payer: Medicare PPO | Admitting: Psychology

## 2019-10-26 ENCOUNTER — Other Ambulatory Visit: Payer: Self-pay

## 2019-10-26 DIAGNOSIS — R413 Other amnesia: Secondary | ICD-10-CM | POA: Diagnosis not present

## 2019-10-26 DIAGNOSIS — R4189 Other symptoms and signs involving cognitive functions and awareness: Secondary | ICD-10-CM

## 2019-11-13 NOTE — Progress Notes (Signed)
10/26/2019 4 PM: Today's visit was an in person visit that was conducted in my outpatient clinic office with the patient myself present.  We reviewed the results of the formal neuropsychological evaluation.  The completed neuropsychological evaluation report can be found in the patient's EMR dated 10/18/2019 with her full history and initial history and physical report dated 09/28/2019.  Below is a copy of the summary of results and impressions/diagnostic considerations from that formal report.  The patient fully understood the results of this testing and we reviewed the plan going forward and recommendations.     Summary of Results:                        Overall, the results of the current objective neuropsychological evaluation looking at a wide range of various cognitive domains as well as a thorough evaluation of memory functions do suggest that the patient has some mild decrease in global cognitive performances.  Almost all of her difficulties fell in the visual-spatial and visual domains.  The patient showed good performance and well retained performances for verbal reasoning and problem-solving, vocabulary knowledge and her general fund of information.  Pure auditory encoding was quite well retained and the patient showed excellent performance for auditory memory and learning.  The patient also showed very good delayed memory performance and information that was initially stored and organized was available after a period of delay with no loss of information.  This was true for both auditory in particular but also for visual memory regarding delayed memory.  The patient showed mild deficits with regard to visual reasoning and problem solving, visual analysis and visual judgment measures, reductions in visual information processing speed, visual encoding abilities, difficulties with visual attention, shifting of attention and adjusting strategies for visual measures.  Impression/Diagnosis:                      The results of the current neuropsychological evaluation when taking in the patient's subjective symptoms and reports along with her husband's, neurological information including brain imaging as well as objective cognitive performances do suggest that the patient is having some mild to moderate loss in cognitive functioning.  However, almost all of these deficits were in the visual domain including difficulties with visual reasoning and problem-solving, visual attention, visual judgment, slowing of visual information processing, executive functions for visual challenges including difficulties with perseverative patterns and shifting of visual attention.  The patient's memory for auditory functioning was actually quite good while her visual memory was impaired.  Most of this is likely related to visual encoding and visual attention deficits making it hard for her to initially process visual information which then makes it difficult for it to be accurately stored.  However, along with her auditory memory if the visual information is able to be stored into short-term memory it was retained and clearly able to be retrieved at a later date.  There was no indication of delayed memory deficits for either visual or auditory information.  As far as diagnostic considerations the patient's cognitive strengths and weaknesses, neurological and medical data and subjective symptoms are not consistent with those typically seen with more common cortical degenerative dementia such as Alzheimer's, Lewy body or other cortical dementias.  There was some pattern of lateralization to performance with almost all of her deficits in the areas of visual processing and visual cognitive functioning.  MRI/CT studies do not suggest any significant structural changes other than mild  atrophy that would be consistent with age.  The patient denies any geographic disorientation and has continued to drive without incident.  While it is possible  that we are very early in picking up some cognitive changes and the family is concerned identifying some early subjective symptoms the pattern of strengths and weaknesses would not be consistent with any significant degenerative process currently.  The patient is clearly having some cognitive deficits that are almost exclusively related to visual information and primarily right hemisphere functioning.  Given the nature of the cognitive strengths and weaknesses it will be appropriate to reassess the patient in approximately 9 months to determine whether there is further objective findings of cognitive decline and assess the specific areas of change for more definitive diagnostic considerations.  However, at this time the patient does not appear to have any clear pattern consistent with cortical or subcortical mediated degenerative dementia processes.  The patient is clearly having some right hemisphere dysfunction cortically but these are primarily in right frontal and right parietal lobe functioning.  There do not appear to be any indications of significant left hemispheric dysfunction.  The patient also is describing significant snoring and difficulty with achieving good restful sleep.  It may also be worthwhile having the patient complete a formal sleep study to rule out the possibility of obstructive sleep apnea as the patient may improve in her overall functioning if she turns out to have sleep apnea and it is adequately treated.  There does not appear to be any significant depression or anxiety or other mood related changes.  Patient may benefit from having a formal sleep study performed to rule out obstructive sleep apnea.  Cognitive change  Memory changes   Ilean Skill, Psy.D. Neuropsychologist

## 2019-11-21 NOTE — Telephone Encounter (Signed)
error 

## 2019-12-11 ENCOUNTER — Other Ambulatory Visit: Payer: Self-pay | Admitting: Internal Medicine

## 2019-12-11 DIAGNOSIS — Z1231 Encounter for screening mammogram for malignant neoplasm of breast: Secondary | ICD-10-CM

## 2019-12-21 DIAGNOSIS — Z7189 Other specified counseling: Secondary | ICD-10-CM | POA: Insufficient documentation

## 2019-12-21 NOTE — Progress Notes (Signed)
Cardiology Office Note   Date:  12/22/2019   ID:  Terri Ortega, DOB [birth year], MRN [id number]  PCP:  Terri Carol, MD  Cardiologist:   Minus Breeding, MD   Chief Complaint  Patient presents with  . Abnormal ECG      History of Present Illness: Terri Ortega is a 79 y.o. female who presents for follow up of a mildly reduced ejection fraction.  This was noted 7 years ago.  Her EF was found to be 45% on an echocardiogram.  However, subsequent follow-up demonstrated to be normalized to 65%.  I last saw her virtually preoperatively prior to knee replacement.    She said the knee really is still very sore.  She is only able to do some walking in her house house. The patient denies any new symptoms such as chest discomfort, neck or arm discomfort. There has been no new shortness of breath, PND or orthopnea. There have been no reported palpitations, presyncope or syncope.    Past Medical History:  Diagnosis Date  . Graves disease   . HTN (hypertension)   . Low back pain    Dr. Ronnald Ortega  . Osteoporosis   . Pneumonia 2012  . Radiation 03/20/14, 03/27/14, 04/05/14, 04/12/14, 04/17/14   bracytherapy to proximal vagina 30 gray  . Scoliosis   . Uterine cancer ([place])    surgery and radiation x 5 treatments-last 9'15  . Vitamin D deficiency     Past Surgical History:  Procedure Laterality Date  . BUNIONECTOMY     left  . CATARACT EXTRACTION, BILATERAL Bilateral    last done 11-21-14  . COLONOSCOPY WITH PROPOFOL N/A 12/25/2014   Procedure: COLONOSCOPY WITH PROPOFOL;  Surgeon: Terri Fair, MD;  Location: [facility];  Service: Endoscopy;  Laterality: N/A;  . FOOT SURGERY Left 2004   Dr Terri Ortega  . JOINT REPLACEMENT     RTKA  . ROBOTIC ASSISTED TOTAL HYSTERECTOMY WITH BILATERAL SALPINGO OOPHERECTOMY  01/30/14   with lymph node biopsy  . TOTAL KNEE ARTHROPLASTY     right  . TOTAL KNEE REVISION Right 01/04/2019   Procedure: TOTAL KNEE REVISION;  Surgeon: Terri Arabian, MD;   Location: [facility];  Service: Orthopedics;  Laterality: Right;  131min with block     Current Outpatient Medications  Medication Sig Dispense Refill  . amoxicillin (AMOXIL) 500 MG capsule Take 2,000 mg by mouth See admin instructions. Take 2000 mg 1 hour prior to dental work    . Aspirin Effervescent (ALKA-SELTZER PO) Take by mouth.    . Cholecalciferol (VITAMIN D3 PO) Take 2,000 Int'l Units by mouth daily.    . furosemide (LASIX) 20 MG tablet Take 20 mg by mouth every morning.     Marland Kitchen levothyroxine (SYNTHROID) 88 MCG tablet Take 88 mcg by mouth daily before breakfast.     . Multiple Vitamins-Minerals (CENTRUM SILVER PO) Take by mouth.    . Naproxen Sodium (ALEVE PO) Take 220 mg by mouth as needed.    Marland Kitchen OVER THE COUNTER MEDICATION Tums    . potassium chloride (K-DUR) 10 MEQ tablet Take 10 mEq by mouth every other day.     . raloxifene (EVISTA) 60 MG tablet Take 60 mg by mouth daily with breakfast.      No current facility-administered medications for this visit.    Allergies:   Percocet [oxycodone-acetaminophen]    ROS:  Please see the history of present illness.   Otherwise, review of systems are positive for none.  All other systems are reviewed and negative.    PHYSICAL EXAM: VS:  BP 132/70   Pulse 70   Ht 5\' 5"  (1.651 m)   Wt 180 lb (81.6 kg)   SpO2 96%   BMI 29.95 kg/m  , BMI Body mass index is 29.95 kg/m. GENERAL:  Well appearing NECK:  No jugular venous distention, waveform within normal limits, carotid upstroke brisk and symmetric, no bruits, no thyromegaly LUNGS:  Clear to auscultation bilaterally CHEST:  Unremarkable HEART:  PMI not displaced or sustained,S1 and S2 within normal limits, no S3, no S4, no clicks, no rubs, no murmurs ABD:  Flat, positive bowel sounds normal in frequency in pitch, no bruits, no rebound, no guarding, no midline pulsatile mass, no hepatomegaly, no splenomegaly EXT:  2 plus pulses throughout, no edema, no cyanosis no clubbing    EKG:   EKG is ordered today. The ekg ordered today demonstrates sinus rhythm, rate 63, left axis deviation, left ventricular hypertrophy by voltage criteria, lateral T wave inversions all unchanged from previous.   Recent Labs: 12/30/2018: ALT 15 01/06/2019: BUN 16; Creatinine, Ser 0.72; Hemoglobin 10.0; Platelets 157; Potassium 3.5; Sodium 136    Lipid Panel    Component Value Date/Time   CHOL 147 10/31/2014 0241   TRIG 38 10/31/2014 0241   HDL 52 10/31/2014 0241   CHOLHDL 2.8 10/31/2014 0241   VLDL 8 10/31/2014 0241   LDLCALC 87 10/31/2014 0241      Wt Readings from Last 3 Encounters:  12/22/19 180 lb (81.6 kg)  09/06/19 177 lb (80.3 kg)  06/28/19 170 lb 6 oz (77.3 kg)      Other studies Reviewed: Additional studies/ records that were reviewed today include: Labs. Review of the above records demonstrates:  Please see elsewhere in the note.     ASSESSMENT AND PLAN:   CARDIOMYOPATHY:   I would not suspect by exam that her EF is lower.  No change in therapy.  HTN: The blood pressure is  controlled.  No change in therapy.   AORTIC VALVE DISEASE:   She has had some mild aortic regurgitation.   I do not appreciate any murmur that would suggest worsening insufficiency.  I will follow this clinically.    CHEST PAIN:  She had some pain in her back relieved with removing the bra strap approximately 1 point but otherwise no chest pain.  No further work-up.  ANEMIA: I note from June of last year when she saw neurology that she was anemic but she was unaware of this.  She had blood work she says by Dr. Delfina Ortega since then and I have referred her back to him to follow-up on this.  COVID EDUCATION: She has had her vaccine.   Current medicines are reviewed at length with the patient today.  The patient does not have concerns regarding medicines.  The following changes have been made:  no change  Labs/ tests ordered today include: None No orders of the defined types were placed  in this encounter.    Disposition:   FU with me in one year    Signed, Minus Breeding, MD  12/22/2019 10:33 AM    [place]

## 2019-12-22 ENCOUNTER — Encounter: Payer: Self-pay | Admitting: Cardiology

## 2019-12-22 ENCOUNTER — Ambulatory Visit: Payer: Medicare PPO | Admitting: Cardiology

## 2019-12-22 ENCOUNTER — Other Ambulatory Visit: Payer: Self-pay

## 2019-12-22 VITALS — BP 132/70 | HR 70 | Ht 65.0 in | Wt 180.0 lb

## 2019-12-22 DIAGNOSIS — I350 Nonrheumatic aortic (valve) stenosis: Secondary | ICD-10-CM | POA: Diagnosis not present

## 2019-12-22 DIAGNOSIS — Z7189 Other specified counseling: Secondary | ICD-10-CM | POA: Diagnosis not present

## 2019-12-22 NOTE — Patient Instructions (Signed)

## 2019-12-25 ENCOUNTER — Ambulatory Visit: Payer: Medicare PPO | Admitting: Neurology

## 2019-12-25 ENCOUNTER — Ambulatory Visit: Payer: Medicare PPO | Admitting: Psychology

## 2019-12-25 ENCOUNTER — Encounter: Payer: Self-pay | Admitting: Neurology

## 2019-12-25 VITALS — BP 142/75 | HR 65 | Ht 64.0 in | Wt 180.0 lb

## 2019-12-25 DIAGNOSIS — R4189 Other symptoms and signs involving cognitive functions and awareness: Secondary | ICD-10-CM

## 2019-12-25 NOTE — Progress Notes (Signed)
[facility]    Provider:  Dr Jaynee Eagles Requesting Provider: [name], MD Primary Care Provider:  [name], MD  CC:  Memory loss  Interval history Dec 25, 2019: Here with husband and daughter to review work-up.  Reviewed MRI of the brain images as below, also reviewed findings of formal memory testing which were not consistent with degenerative dementia such as Alzheimer's, Lewy body or other cortical dementia, there was some pattern of lateralization to performance with almost all her deficits in the areas of visual processing and visual cognitive functioning, it could be very early in picking up some cognitive changes they will retest her in 9 months.  We offered her a sleep test due to her snoring and difficulty with achieving good restful sleep but she declined.  No significant depression or anxiety.  Answered all questions.  1.   Mild to moderate generalized cortical atrophy. 2.   T2/FLAIR hyperintense foci in the hemispheres consistent with moderate chronic microvascular ischemic change. 3.   Mild right chronic maxillary sinusitis. 4.   There were no acute findings and there is a normal enhancement pattern.  HPI:  Terri Ortega is a 79 y.o. female here as requested by [name], MD for memory loss. PMHx high blood pressure, uterine cancer, osteoporosis, hypothyroidism, memory difficulties, diastolic congestive heart failure, overweight.  I reviewed notes from Dr. Delfina Redwood, B12 458, TSH 4.27.  Patient recently presented for memory evaluation, patient considering better memory, she scored 26 out of 30 on the Mini-Mental status exam, I am not sure but the note states possibly she has been part of a financial scheme or the victim I am not sure, she could not recall any other specific issues with her memory, she did report being under a great deal expressed about the election in the pandemic.  I reviewed the report of a head CT from 2004, patient had a fall, head CT  without contrast, no evidence of fracture or intracranial hemorrhage, mild atrophy, overall unremarkable per report (too old to be able to access images). She is here with her daughter who also provides much information. She had knee surgery in 01/2019 and she has improved but it is still hard for her, her knee hurts when she walks. She is very independent, she drives to the grocery store, still driving well, no falls, she does not think her memory has been a problem even since getting anesthesia, she feels her husband's memory problems are so obvious and he knows it, it is frustrating for her husband and she notices it, moreso names of people and she notices it, she hekps him keep appointments. They visit with family and grandchildren, she still enjoys socializing, seeing family, its hard with Covid and isolation and they can't see relatives such as her sister in law who is close by but can't spend time together. They grow collards in their garden. She does notice some memory changes in herself. Daughter says they feel patient's logic is impaired and some memory, the logic is impaired, she still texts but she cn get mixed up sometime, she had a scammer call her on the phone and a few years ago she gave $300 to someone she had no idea, to pay someone to keep her computer updated, he called again and fortunately she did not lose anything again and she gave up talking to him and went to her bank and told them what had happened and they pulled up her account. The week before last  she started to pull up the [employer] site for her husband she pays the bills and suddenly on her monitor she saw menus she called the number and he wanted $200 and she declined. Her mother died young, she was a foster child, she doesn't know her father. She likes to take a nap in  The afternoon. Discussed POA and HCPOA. She snores heavily, she drools on her pillow, she denies vivid dreams, no shaking, no shuffling, no depression or anxiety.    Reviewed notes, labs and imaging from outside physicians, which showed:   See above  Review of Systems: Patient complains of symptoms per HPI as well as the following symptoms: memory loss, knee pain. Pertinent negatives and positives per HPI. All others negative.   Social History   Socioeconomic History  . Marital status: Married    Spouse name: Not on file  . Number of children: 3  . Years of education: Not on file  . Highest education level: High school graduate  Occupational History  . Occupation: Chartered certified accountant  Tobacco Use  . Smoking status: Never Smoker  . Smokeless tobacco: Never Used  Substance and Sexual Activity  . Alcohol use: Never  . Drug use: Never  . Sexual activity: Not Currently  Other Topics Concern  . Not on file  Social History Narrative   Lives at home with husband, Rush [place]mark.  Four grand and five great grands.     Caffeine: diet coke 3/week   Right handed   Social Determinants of Health   Financial Resource Strain:   . Difficulty of Paying Living Expenses:   Food Insecurity:   . Worried About Charity fundraiser in the Last Year:   . Arboriculturist in the Last Year:   Transportation Needs:   . Film/video editor (Medical):   Marland Kitchen Lack of Transportation (Non-Medical):   Physical Activity:   . Days of Exercise per Week:   . Minutes of Exercise per Session:   Stress:   . Feeling of Stress :   Social Connections:   . Frequency of Communication with Friends and Family:   . Frequency of Social Gatherings with Friends and Family:   . Attends Religious Services:   . Active Member of Clubs or Organizations:   . Attends Archivist Meetings:   Marland Kitchen Marital Status:   Intimate Partner Violence:   . Fear of Current or Ex-Partner:   . Emotionally Abused:   Marland Kitchen Physically Abused:   . Sexually Abused:     Family History  Adopted: Yes  Problem Relation Age of Onset  . Heart disease Mother 49       No details    Past Medical  History:  Diagnosis Date  . Graves disease   . HTN (hypertension)   . Low back pain    Dr. Ronnald Ramp  . Osteoporosis   . Pneumonia 2012  . Radiation 03/20/14, 03/27/14, 04/05/14, 04/12/14, 04/17/14   bracytherapy to proximal vagina 30 gray  . Scoliosis   . Uterine cancer ([place])    surgery and radiation x 5 treatments-last 9'15  . Vitamin D deficiency     Patient Active Problem List   Diagnosis Date Noted  . Educated about COVID-19 virus infection 12/21/2019  . Cognitive change 09/06/2019  . 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 Preop cardiovascular exam 12/20/2018  . 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 Uterine cancer ([place])  03/28/2018  . Arthralgia of hip or thigh 03/28/2018  . Benign essential HTN 03/28/2018  . Overweight 03/28/2018  . Heart disease 11/09/2014  . Leukocytosis 10/31/2014  . Diastolic congestive heart failure ([place]) 10/30/2014  . Hypothyroidism 10/30/2014  . Chest pain at rest 10/30/2014  . Chest pain 10/30/2014  . Endometrial cancer ([place]) 01/30/2014  . Dyspnea 01/11/2014  . Edema 01/11/2014  . Low back pain with right-sided sciatica 12/13/2013  . Degeneration of lumbar or lumbosacral intervertebral disc 12/13/2013  . Sciatica 12/13/2013    Past Surgical History:  Procedure Laterality Date  . BUNIONECTOMY     left  . CATARACT EXTRACTION, BILATERAL Bilateral    last done 11-21-14  . COLONOSCOPY WITH PROPOFOL N/A 12/25/2014   Procedure: COLONOSCOPY WITH PROPOFOL;  Surgeon: Garlan Fair, MD;  Location: [facility];  Service: Endoscopy;  Laterality: N/A;  . FOOT SURGERY Left 2004   Dr Johnnye Sima  . JOINT REPLACEMENT     RTKA  . ROBOTIC ASSISTED TOTAL HYSTERECTOMY WITH BILATERAL SALPINGO OOPHERECTOMY  01/30/14   with lymph node biopsy  . TOTAL KNEE ARTHROPLASTY     right  . TOTAL KNEE REVISION Right 01/04/2019   Procedure: TOTAL KNEE REVISION;  Surgeon: Gaynelle Arabian, MD;   Location: [facility];  Service: Orthopedics;  Laterality: Right;  131min with block    Current Outpatient Medications  Medication Sig Dispense Refill  . amoxicillin (AMOXIL) 500 MG capsule Take 2,000 mg by mouth See admin instructions. Take 2000 mg 1 hour prior to dental work    . Aspirin Effervescent (ALKA-SELTZER PO) Take by mouth.    . Cholecalciferol (VITAMIN D3 PO) Take 2,000 Int'l Units by mouth daily.    . furosemide (LASIX) 20 MG tablet Take 20 mg by mouth every morning.     Marland Kitchen levothyroxine (SYNTHROID) 88 MCG tablet Take 88 mcg by mouth daily before breakfast.     . Multiple Vitamins-Minerals (CENTRUM SILVER PO) Take by mouth.    . Naproxen Sodium (ALEVE PO) Take 220 mg by mouth as needed.    Marland Kitchen OVER THE COUNTER MEDICATION Tums    . potassium chloride (K-DUR) 10 MEQ tablet Take 10 mEq by mouth every other day.     . raloxifene (EVISTA) 60 MG tablet Take 60 mg by mouth daily with breakfast.      No current facility-administered medications for this visit.    Allergies as of 12/25/2019 - Review Complete 12/25/2019  Allergen Reaction Noted  . Percocet [oxycodone-acetaminophen] Nausea And Vomiting 09/06/2019    Vitals: BP (!) 142/75 (BP Location: Right Arm, Patient Position: Sitting)   Pulse 65   Ht 5\' 4"  (1.626 m)   Wt 180 lb (81.6 kg)   BMI 30.90 kg/m  Last Weight:  Wt Readings from Last 1 Encounters:  12/25/19 180 lb (81.6 kg)   Last Height:   Ht Readings from Last 1 Encounters:  12/25/19 5\' 4"  (1.626 m)     Physical exam: Exam: Gen: NAD, conversant, well nourised, obese, well groomed                     CV: RRR, no MRG. No Carotid Bruits. No peripheral edema, warm, nontender Eyes: Conjunctivae clear without exudates or hemorrhage  Neuro: Detailed Neurologic Exam  Speech:    Speech is normal; fluent and spontaneous with normal comprehension.  Cognition:  MMSE - Mini Mental State Exam 09/06/2019  Orientation to time 5  Orientation to Place 5  Registration 3  Attention/ Calculation 2  Recall 3  Language- name 2 objects 2  Language- repeat 1  Language- follow 3 step command 3  Language- read & follow direction 1  Write a sentence 1  Copy design 0  Total score 26       The patient is oriented to person, place, and time;     recent and remote memory intact;     language fluent;     normal attention, concentration,     fund of knowledge and judgement impaired Cranial Nerves:    The pupils are equal, round, and reactive to light.  Attempted funduscopy pupils to small visual fields are full to finger confrontation. Extraocular movements are intact. Trigeminal sensation is intact and the muscles of mastication are normal. The face is symmetric. The palate elevates in the midline. Hearing intact. Voice is normal. Shoulder shrug is normal. The tongue has normal motion without fasciculations.   Coordination:    Normal finger to nose.  Gait:    Heel-toe and tandem gait with imbalance, antalgic due to knee pain  Motor Observation:    No asymmetry, no atrophy, and no involuntary movements noted. Tone:    Normal muscle tone.    Posture:    Posture is normal. normal erect    Strength:    Strength is V/V in the upper and lower limbs.      Sensation: intact to LT     Reflex Exam:  DTR's:    Deep tendon reflexes in the upper and lower extremities are symmetrical and 1+-2+ bilaterally.   Toes:    The toes are equiv bilaterally.   Clonus:    Clonus is absent.    Assessment/Plan: This is an extremely lovely 79 year old patient here with her equally lovely daughter for concerns of memory loss or more appropriately possibly poor judgment.  She is 79 years old, she is quite independent, she still drives, she lives with her husband, however I am getting concerned when I hear things like she has been the victim of skimmer, I do think possibly she has some impaired judgment and insight, at this time daughters concerned and I do think she should  have formal evaluation including MRI of the brain, formal neurocognitive testing, also daughter is concerned about sleeping and whether sleep apnea may be having an effect, patient denies symptoms but I am not quite sure she is a good historian, daughter is worried about her excessive snoring and patient takes a nap during the day we will send her to Dr. Brett Fairy. Her son-in-lase is Mardene Sayer, a pcp in this area whose mother suffered from dementia and he is concerned about his mother in law who is our patient.  - MRI of the brain for reversible causes of dementia: Atrophy and chronic microvascular changes. -  findings of formal memory testing which were not consistent with degenerative dementia such as Alzheimer's, Lewy body or other cortical dementia, there was some pattern of lateralization to performance with almost all her deficits in the areas of visual processing and visual cognitive functioning -May consider FDG PET scan in the future.  They are set up for repeat testing in 9 months and I do think that it is appropriate to wait and retest formal memory at that time.  Return to clinic after that.  Cc: [name], MD  Sarina Ill, MD  [facility] [address] [place], [place] [address]  Phone [phone] Fax [phone]  I spent 25 minutes  of face-to-face and non-face-to-face time with patient on the  1. Cognitive change    diagnosis.  This included previsit chart review, lab review, study review, order entry, electronic health record documentation, patient education on the different diagnostic and therapeutic options, counseling and coordination of care, risks and benefits of management, compliance, or risk factor reduction

## 2019-12-26 ENCOUNTER — Other Ambulatory Visit: Payer: Self-pay

## 2019-12-26 ENCOUNTER — Ambulatory Visit
Admission: RE | Admit: 2019-12-26 | Discharge: 2019-12-26 | Disposition: A | Discharge status: Home / Self Care | Payer: Medicare PPO | Source: Ambulatory Visit | Attending: Internal Medicine | Admitting: Internal Medicine

## 2019-12-26 DIAGNOSIS — Z1231 Encounter for screening mammogram for malignant neoplasm of breast: Secondary | ICD-10-CM

## 2020-01-04 ENCOUNTER — Ambulatory Visit: Payer: Medicare PPO | Admitting: Neurology

## 2020-01-15 ENCOUNTER — Ambulatory Visit: Payer: Medicare Other | Admitting: Psychology

## 2020-01-29 ENCOUNTER — Ambulatory Visit: Payer: Medicare PPO | Admitting: Psychology

## 2020-02-15 DIAGNOSIS — C55 Malignant neoplasm of uterus, part unspecified: Secondary | ICD-10-CM | POA: Diagnosis not present

## 2020-02-15 DIAGNOSIS — E039 Hypothyroidism, unspecified: Secondary | ICD-10-CM | POA: Diagnosis not present

## 2020-02-15 DIAGNOSIS — I5030 Unspecified diastolic (congestive) heart failure: Secondary | ICD-10-CM | POA: Diagnosis not present

## 2020-02-15 DIAGNOSIS — G479 Sleep disorder, unspecified: Secondary | ICD-10-CM | POA: Diagnosis not present

## 2020-02-15 DIAGNOSIS — Z1389 Encounter for screening for other disorder: Secondary | ICD-10-CM | POA: Diagnosis not present

## 2020-02-15 DIAGNOSIS — E663 Overweight: Secondary | ICD-10-CM | POA: Diagnosis not present

## 2020-02-15 DIAGNOSIS — M810 Age-related osteoporosis without current pathological fracture: Secondary | ICD-10-CM | POA: Diagnosis not present

## 2020-02-15 DIAGNOSIS — I10 Essential (primary) hypertension: Secondary | ICD-10-CM | POA: Diagnosis not present

## 2020-02-15 DIAGNOSIS — Z23 Encounter for immunization: Secondary | ICD-10-CM | POA: Diagnosis not present

## 2020-02-15 DIAGNOSIS — R413 Other amnesia: Secondary | ICD-10-CM | POA: Diagnosis not present

## 2020-02-15 DIAGNOSIS — Z0000 Encounter for general adult medical examination without abnormal findings: Secondary | ICD-10-CM | POA: Diagnosis not present

## 2020-02-16 ENCOUNTER — Other Ambulatory Visit: Payer: Self-pay | Admitting: Internal Medicine

## 2020-02-16 DIAGNOSIS — M810 Age-related osteoporosis without current pathological fracture: Secondary | ICD-10-CM

## 2020-03-06 DIAGNOSIS — Z96651 Presence of right artificial knee joint: Secondary | ICD-10-CM | POA: Diagnosis not present

## 2020-03-06 DIAGNOSIS — M25561 Pain in right knee: Secondary | ICD-10-CM | POA: Diagnosis not present

## 2020-03-07 ENCOUNTER — Other Ambulatory Visit (HOSPITAL_COMMUNITY): Payer: Self-pay | Admitting: Physician Assistant

## 2020-03-07 ENCOUNTER — Other Ambulatory Visit: Payer: Self-pay | Admitting: Physician Assistant

## 2020-03-07 DIAGNOSIS — M25561 Pain in right knee: Secondary | ICD-10-CM

## 2020-03-13 DIAGNOSIS — G4733 Obstructive sleep apnea (adult) (pediatric): Secondary | ICD-10-CM | POA: Diagnosis not present

## 2020-03-20 ENCOUNTER — Ambulatory Visit (HOSPITAL_COMMUNITY)
Admission: RE | Admit: 2020-03-20 | Discharge: 2020-03-20 | Disposition: A | Discharge status: Home / Self Care | Payer: Medicare PPO | Source: Ambulatory Visit | Attending: Physician Assistant | Admitting: Physician Assistant

## 2020-03-20 ENCOUNTER — Encounter (HOSPITAL_COMMUNITY)
Admission: RE | Admit: 2020-03-20 | Discharge: 2020-03-20 | Disposition: A | Discharge status: Home / Self Care | Payer: Medicare PPO | Source: Ambulatory Visit | Attending: Physician Assistant | Admitting: Physician Assistant

## 2020-03-20 DIAGNOSIS — M25561 Pain in right knee: Secondary | ICD-10-CM | POA: Diagnosis not present

## 2020-03-20 DIAGNOSIS — Z96651 Presence of right artificial knee joint: Secondary | ICD-10-CM | POA: Diagnosis not present

## 2020-03-20 DIAGNOSIS — Z471 Aftercare following joint replacement surgery: Secondary | ICD-10-CM | POA: Diagnosis not present

## 2020-03-20 MED ORDER — TECHNETIUM TC 99M MEDRONATE IV KIT
20.0000 | PACK | Freq: Once | INTRAVENOUS | Status: AC | PRN
  Administered 2020-03-20: 20 via INTRAVENOUS

## 2020-04-04 DIAGNOSIS — Z96651 Presence of right artificial knee joint: Secondary | ICD-10-CM | POA: Diagnosis not present

## 2020-04-04 DIAGNOSIS — Z471 Aftercare following joint replacement surgery: Secondary | ICD-10-CM | POA: Diagnosis not present

## 2020-04-24 DIAGNOSIS — H04123 Dry eye syndrome of bilateral lacrimal glands: Secondary | ICD-10-CM | POA: Diagnosis not present

## 2020-04-24 DIAGNOSIS — H26493 Other secondary cataract, bilateral: Secondary | ICD-10-CM | POA: Diagnosis not present

## 2020-04-24 DIAGNOSIS — H524 Presbyopia: Secondary | ICD-10-CM | POA: Diagnosis not present

## 2020-04-24 DIAGNOSIS — H43813 Vitreous degeneration, bilateral: Secondary | ICD-10-CM | POA: Diagnosis not present

## 2020-04-29 DIAGNOSIS — Z23 Encounter for immunization: Secondary | ICD-10-CM | POA: Diagnosis not present

## 2020-05-07 ENCOUNTER — Other Ambulatory Visit: Payer: Self-pay

## 2020-05-07 ENCOUNTER — Ambulatory Visit
Admission: RE | Admit: 2020-05-07 | Discharge: 2020-05-07 | Disposition: A | Discharge status: Home / Self Care | Payer: Medicare PPO | Source: Ambulatory Visit | Attending: Internal Medicine | Admitting: Internal Medicine

## 2020-05-07 DIAGNOSIS — M810 Age-related osteoporosis without current pathological fracture: Secondary | ICD-10-CM

## 2020-05-07 DIAGNOSIS — Z780 Asymptomatic menopausal state: Secondary | ICD-10-CM | POA: Diagnosis not present

## 2020-05-31 DIAGNOSIS — M810 Age-related osteoporosis without current pathological fracture: Secondary | ICD-10-CM | POA: Diagnosis not present

## 2020-07-23 DIAGNOSIS — L720 Epidermal cyst: Secondary | ICD-10-CM | POA: Diagnosis not present

## 2020-07-23 DIAGNOSIS — L280 Lichen simplex chronicus: Secondary | ICD-10-CM | POA: Diagnosis not present

## 2020-07-23 DIAGNOSIS — L218 Other seborrheic dermatitis: Secondary | ICD-10-CM | POA: Diagnosis not present

## 2020-07-29 ENCOUNTER — Other Ambulatory Visit: Payer: Self-pay

## 2020-07-29 ENCOUNTER — Encounter: Payer: Medicare PPO | Attending: Psychology | Admitting: Psychology

## 2020-07-29 DIAGNOSIS — R4189 Other symptoms and signs involving cognitive functions and awareness: Secondary | ICD-10-CM | POA: Diagnosis not present

## 2020-07-29 DIAGNOSIS — R413 Other amnesia: Secondary | ICD-10-CM

## 2020-07-30 ENCOUNTER — Encounter: Payer: Self-pay | Admitting: Psychology

## 2020-07-30 NOTE — Progress Notes (Signed)
Neuropsychological Consultation   Patient:   Terri Ortega   DOB:   1941/02/16  MR Number:  [id number]  Location:  [facility] [place], [place] [id number] [place] [address] Dept: [phone]           Date of Service:   07/29/2020  Start Time:   1 PM End Time:   2 PM  Provider/Observer:  Ilean Skill, Psy.D.       Clinical Neuropsychologist       Billing Code/Service: [id number]  Chief Complaint:    Terri Ortega is an 79 year old female that was initially referred by Dr. Jaynee Eagles for neuropsychological evaluation due to reports of increasing issues with memory and forgetfulness.  The patient also continues to have significant pain with her knee that she had surgery on more than a year ago.  The patient has a past medical history of high blood pressure, uterine cancer, osteoporosis, hypothyroidism, memory difficulties diastolic congestive heart failure.  Patient continues to minimize some of her difficulties but admits that she has been experiencing little worsening of her symptoms over the past year.  I initially saw her for a neuropsychological evaluation at the beginning of this year and this is the 63-month follow-up of testing.  Reason for Service:  Terri Ortega is an 79 year old female that was initially referred by Dr. Jaynee Eagles for neuropsychological evaluation due to reports of increasing issues with memory and forgetfulness.  The patient also continues to have significant pain with her knee that she had surgery on more than a year ago.  The patient has a past medical history of high blood pressure, uterine cancer, osteoporosis, hypothyroidism, memory difficulties diastolic congestive heart failure.  Patient continues to minimize some of her difficulties but admits that she has been experiencing little worsening of her symptoms over the past year.  I  initially saw her for a neuropsychological evaluation at the beginning of this year and this is the 1-month follow-up of testing.  Today, during the follow-up clinical interview the patient and her daughter were present.  The patient reports that she feels that her memory difficulties have been "a little worse" than they were during our initial evaluation and she feels like she is having a little more trouble coming up with people's names.  The patient is continuing to have problems with balance and gait and she reports these balance issues are not solely related to continued pain in her knee after knee replacement surgery.  The patient's daughter reports that there have continue to be some changes but not very dramatic since our last evaluation.  The patient's daughter reports that she has noted some mild progression and memory difficulties particularly during discussions with family members and having difficulty remembering these discussions later.  The patient is described as remaining in keeping her capacity for remembering others names and still good with geographic orientation.  The patient's daughter reports that the patient has more "curt" in her responding to others.  The patient is described as being abrupt terse in her reactions with others at the time.  While this is a trait for her in the past it has become more dominant and the patient is described as having less of a "filter" and what she is saying.  Since our initial evaluation the family has discussed with other family members issues of possible dementia.  The patient and her daughter have been told that there  is some family history of dementia but most were post age 70.  Below I will include the history and reason for service that was initially recorded during the initial neuropsychological evaluation done in February and March of this year.  The patient had a head CT in 2004 after a fall.  There was no evidence of fracture or intracranial  hemorrhage but mild atrophy was noted but the CT was relatively unremarkable.  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  There are no indications of fall or other gait changes.  She is remaining actively socially although COVID-19 and social isolation's have been problematic.  Medical records show that there are some concerns for the patient's daughter feel like the patient's logic has been impaired and there are some memory difficulties.  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  She was ultimately able to get out of the issues with a friend who is sophisticated in Engineer, mining but it did take some doing to avoid significant harm from this computer Internet scam.  The patient has had a recent knee replacement and has had some changes in gait with increased pain.  She reports that it is improving some more recently and she is doing better but she continues to have significant pain.  The patient and her husband deny any significant mood changes and no issues of depression or anxiety.  There are no indications of any geographic disorientation changes although the patient is described by both herself and her husband has never been very good with direction.  The patient reports that she does have some issues with sleep related to snoring heavily but overall she feels like her sleep pattern is adequate.  The patient has a good appetite and does try to have good nutrition.  The patient and her husband deny any indications of tremor, visual or auditory hallucination or other behavioral or psychiatric changes.  Expressive and receptive language appear to be within normal limits.  For convenience, I will also include the summary and interpretation of the previous neuropsychological evaluation that was completed and can be found in its entirety in the patient's EMR  dated 10/18/2019.    Impression/Diagnosis:                     The results of the current neuropsychological evaluation when taking in the patient's subjective symptoms and reports along with her husband's, neurological information including brain imaging as well as objective cognitive performances do suggest that the patient is having some mild to moderate loss in cognitive functioning.  However, almost all of these deficits were in the visual domain including difficulties with visual reasoning and problem-solving, visual attention, visual judgment, slowing of visual information processing, executive functions for visual challenges including difficulties with perseverative patterns and shifting of visual attention.  The patient's memory for auditory functioning was actually quite good while her visual memory was impaired.  Most of this is likely related to visual encoding and visual attention deficits making it hard for her to initially process visual information which then makes it difficult for it to be accurately stored.  However, along with her auditory memory if the visual information is able to be stored into short-term memory it was retained and clearly able to be retrieved at a later date.  There was no indication of delayed memory  deficits for either visual or auditory information.  As far as diagnostic considerations the patient's cognitive strengths and weaknesses, neurological and medical data and subjective symptoms are not consistent with those typically seen with more common cortical degenerative dementia such as Alzheimer's, Lewy body or other cortical dementias.  There was some pattern of lateralization to performance with almost all of her deficits in the areas of visual processing and visual cognitive functioning.  MRI/CT studies do not suggest any significant structural changes other than mild atrophy that would be consistent with age.  The patient denies any geographic disorientation and  has continued to drive without incident.  While it is possible that we are very early in picking up some cognitive changes and the family is concerned identifying some early subjective symptoms the pattern of strengths and weaknesses would not be consistent with any significant degenerative process currently.  The patient is clearly having some cognitive deficits that are almost exclusively related to visual information and primarily right hemisphere functioning.  Given the nature of the cognitive strengths and weaknesses it will be appropriate to reassess the patient in approximately 9 months to determine whether there is further objective findings of cognitive decline and assess the specific areas of change for more definitive diagnostic considerations.  However, at this time the patient does not appear to have any clear pattern consistent with cortical or subcortical mediated degenerative dementia processes.  The patient is clearly having some right hemisphere dysfunction cortically but these are primarily in right frontal and right parietal lobe functioning.  There do not appear to be any indications of significant left hemispheric dysfunction.  The patient also is describing significant snoring and difficulty with achieving good restful sleep.  It may also be worthwhile having the patient complete a formal sleep study to rule out the possibility of obstructive sleep apnea as the patient may improve in her overall functioning if she turns out to have sleep apnea and it is adequately treated.  There does not appear to be any significant depression or anxiety or other mood related changes.  Patient may benefit from having a formal sleep study performed to rule out obstructive sleep apnea.  Behavioral Observation: Terri Ortega  presents as a 79 y.o.-year-old Right handed Caucasian Female who appeared her stated age. her dress was Appropriate and she was Well Groomed and her manners were  Appropriate to the situation.  her participation was indicative of Appropriate and Redirectable behaviors.  There were physical disabilities noted.  she displayed an appropriate level of cooperation and motivation.     Interactions:    Active Appropriate  Attention:   within normal limits and attention span and concentration were age appropriate  Memory:   abnormal; remote memory intact, recent memory impaired  Visuo-spatial:  not examined  Speech (Volume):  normal  Speech:   normal; normal  Thought Process:  Coherent and Relevant  Though Content:  WNL; not suicidal and not homicidal  Orientation:   person, place, time/date and situation  Judgment:   Good  Planning:   Good  Affect:    Blunted  Mood:    Euphoric  Insight:   Good  Intelligence:   normal  Marital Status/Living:  The patient was born and raised in [place] [facility] with no siblings.  The patient's mother's pregnancy with her was within normal limits and the patient reached developmental milestones at the appropriate time.  The patient continues to live in her home with her husband and has lived  in [place] for the past 11 years and lived in [place] for 45 years prior to that.  The patient and her husband have 2 adult children 1 daughter and 1 son.  They are both described as being in good health   Current Employment: The patient is retired.  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  Hobbies and interests have included playing with her grandchildren.  Substance Use:  No concerns of substance abuse are reported.    Education:   HS Graduate  Medical History:   Past Medical History:  Diagnosis Date  . Graves disease   . HTN (hypertension)   . Low back pain    Dr. Yetta Barre  . Osteoporosis   . Pneumonia 2012  . Radiation 03/20/14, 03/27/14, 04/05/14, 04/12/14, 04/17/14   bracytherapy to proximal vagina 30 gray  . Scoliosis   .  Uterine cancer (HCC)    surgery and radiation x 5 treatments-last 9'15  . Vitamin D deficiency          Patient Active Problem List   Diagnosis Date Noted  . Educated about COVID-19 virus infection 12/21/2019  . Cognitive change 09/06/2019  . 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 Preop cardiovascular exam 12/20/2018  . 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 Uterine cancer (HCC) 03/28/2018  . Arthralgia of hip or thigh 03/28/2018  . Benign essential HTN 03/28/2018  . Overweight 03/28/2018  . Heart disease 11/09/2014  . Leukocytosis 10/31/2014  . Diastolic congestive heart failure (HCC) 10/30/2014  . Hypothyroidism 10/30/2014  . Chest pain at rest 10/30/2014  . Chest pain 10/30/2014  . Endometrial cancer (HCC) 01/30/2014  . Dyspnea 01/11/2014  . Edema 01/11/2014  . Low back pain with right-sided sciatica 12/13/2013  . Degeneration of lumbar or lumbosacral intervertebral disc 12/13/2013  . Sciatica 12/13/2013     Psychiatric History:  No prior psychiatric history noted.  Family Med/Psych History:  Family History  Adopted: Yes  Problem Relation Age of Onset  . Heart disease Mother 31       No details    Impression/DX:  Today's visit is part of the follow-up neuropsychological evaluation to the evaluation completed approximately 6 months ago.  With the initial evaluation, the patient did not present with symptoms consistent with patterns typically seen with more common cortical degenerative dementia such as Alzheimer's, Lewy body or other cortical dementia's.  There was a pattern of lateralization of performance with almost all of her deficits in the area of visual processing and visual cognitive functioning.  There were no abnormalities noted on MRI/CT studies suggested of acute or structural changes other than mild atrophy consistent with age.  Today is the follow-up evaluation to assess for any  progressive changes and provide more clear diagnostic considerations.  Disposition/Plan:  The patient has been set up for formal testing and we will replicate the previous neuropsychological battery that included the Wechsler Adult Intelligence Scale-for, the Wechsler Memory Scale's for older adults, modified  card sorting test, [employer] part a and b .  This will allow for direct comparisons for any changes or progression with regard to the patient's symptoms.  Diagnosis:    Cognitive change  Memory changes         Electronically Signed   _______________________ Arley Phenix, Psy.D. Clinical Neuropsychologist

## 2020-08-12 ENCOUNTER — Ambulatory Visit (INDEPENDENT_AMBULATORY_CARE_PROVIDER_SITE_OTHER): Payer: Medicare PPO

## 2020-08-12 ENCOUNTER — Ambulatory Visit: Payer: Medicare PPO | Admitting: Podiatry

## 2020-08-12 ENCOUNTER — Other Ambulatory Visit: Payer: Self-pay

## 2020-08-12 ENCOUNTER — Encounter: Payer: Self-pay | Admitting: Podiatry

## 2020-08-12 DIAGNOSIS — M79672 Pain in left foot: Secondary | ICD-10-CM | POA: Diagnosis not present

## 2020-08-12 DIAGNOSIS — M79671 Pain in right foot: Secondary | ICD-10-CM

## 2020-08-12 DIAGNOSIS — R2681 Unsteadiness on feet: Secondary | ICD-10-CM | POA: Diagnosis not present

## 2020-08-13 NOTE — Progress Notes (Signed)
Subjective:   Patient ID: Terri Ortega, female   DOB: 80 y.o.   MRN: [id number]   HPI Patient presents stating she is concerned about balance issues and states that its been going on 2 to 3 months and she had knee replacement surgery done 6 months ago. Patient does not smoke likes to be active   Review of Systems  All other systems reviewed and are negative.       Objective:  Physical Exam Vitals and nursing note reviewed.  Constitutional:      Appearance: She is well-developed and well-nourished.  Cardiovascular:     Pulses: Intact distal pulses.  Pulmonary:     Effort: Pulmonary effort is normal.  Musculoskeletal:        General: Normal range of motion.  Skin:    General: Skin is warm.  Neurological:     Mental Status: She is alert.     Neurovascular status was found to be intact muscle strength is found to be adequate range of motion adequate. Patient does have diminishment of sharp dull vibratory but no indications of muscle weakness currently and is able to walk with heel toe gait pattern but does have a limp from knee replacement left     Assessment:  Difficult to say with this instability whether or not this may be related to neuropathic condition possible back issues or related to knee replacement     Plan:  H&P education rendered condition discussed at great length and at this point organ to hold off on any active treatment even though if symptoms were to worsen she probably should have nerve conduction studies and consideration long-term for balance bracing. Patient is encouraged to call with questions concerns which may arise  X-rays are negative for signs of bony encroachment or pathology or collapse medial longitudinal arch bilateral

## 2020-08-29 ENCOUNTER — Other Ambulatory Visit: Payer: Self-pay | Admitting: Internal Medicine

## 2020-08-29 DIAGNOSIS — R42 Dizziness and giddiness: Secondary | ICD-10-CM

## 2020-09-02 ENCOUNTER — Other Ambulatory Visit: Payer: Self-pay

## 2020-09-02 ENCOUNTER — Encounter: Payer: Self-pay | Admitting: Psychology

## 2020-09-02 ENCOUNTER — Encounter: Payer: Medicare PPO | Attending: Psychology | Admitting: Psychology

## 2020-09-02 DIAGNOSIS — R413 Other amnesia: Secondary | ICD-10-CM

## 2020-09-02 DIAGNOSIS — R4189 Other symptoms and signs involving cognitive functions and awareness: Secondary | ICD-10-CM | POA: Diagnosis not present

## 2020-09-02 NOTE — Progress Notes (Addendum)
    Neuropsychology Note Terri Ortega completed 120 minutes of repeat neuropsychological testing with this provider. She did not appear to have difficulty seeing, hearing, or understanding test items/questions; did not require much additional prompting. She exhibited adequate distress tolerance on questions she did not know or tasks that were more difficult. The patient did describe feeling "very tired" and slightly dizzy after completing the WAIS-IV and asked that she stop early and come back for another 2-hour appointment to complete battery from initial evaluation. I agreed with this request.     Note: Patient states that dizziness has increased in the past few months and leads to feeling frustrated often. Experiences lightheadedness upon getting up from supine position.   Behavioral Observations:  Appearance: Casually and appropriately dressed with good hygiene. Gait: Ambulated with cane for assistance. Left sided weakness (e.g., drag left leg) Speech: Clear, normal rate, normal tone & volume. Thought process: Goals directed, Linear, and Logical, ogical.  Mood/Affect: Mildly depressed. Appropriate.  Interpersonal: Polite and appropriate. Orientation: Oriented x 4 Effort/Motivation: Good. Cooperative with all assigned tasks.   Tests Administered:   Wechsler Adult Intelligence Scale, 4th Edition (WAIS-IV)  Results:  To be included once testing and scoring is complete.  Plan:  Patient will contact front office tomorrow and schedule another 2-hour testing appointment in order to complete measures of learning and memory (WMS-IV-Older Adult Battery), aspects of executive functioning (I.e., Trails B, M-WCST), and language (I.e., verbal fluency and confrontational naming). The patient will return to complete the above referenced battery of neuropsychological testing with this provider Subsequently, the patient will see Dr. Sima Matas on 11/04/20 for a follow-up session at which time her  test performances and my impressions and treatment recommendations will be reviewed in detail.   Evaluation ongoing; full report to follow.

## 2020-09-04 ENCOUNTER — Encounter: Payer: Medicare PPO | Admitting: Psychology

## 2020-09-04 ENCOUNTER — Ambulatory Visit
Admission: RE | Admit: 2020-09-04 | Discharge: 2020-09-04 | Disposition: A | Discharge status: Home / Self Care | Payer: Medicare PPO | Source: Ambulatory Visit | Attending: Internal Medicine | Admitting: Internal Medicine

## 2020-09-04 DIAGNOSIS — R42 Dizziness and giddiness: Secondary | ICD-10-CM

## 2020-09-04 DIAGNOSIS — R519 Headache, unspecified: Secondary | ICD-10-CM | POA: Diagnosis not present

## 2020-09-09 DIAGNOSIS — R42 Dizziness and giddiness: Secondary | ICD-10-CM | POA: Diagnosis not present

## 2020-09-09 NOTE — Progress Notes (Incomplete)
    Neuropsychology Note  Terri Ortega completed 120 minutes of repeat neuropsychological testing with this provider. The patient did describe feeling "very tired" and slightly dizzy after completing the WAIS-IV and asked that she stop early and come back for another 2-hour appointment to complete battery from initial evaluation. I agreed with this request.     Behavioral Observations:  Appearance: Casually and appropriately dressed with good hygiene. Gait: Ambulated with cane for assistance. Left sided weakness (e.g., dragged left leg) Speech: Clear, normal rate, normal tone & volume. Thought process: Linearr, Logical, ogical.  Mood/Affect: Mildly depressed. Appropriate.  Interpersonal: Polite and appropriate. Orientation: Oriented x 4 Effort/Motivation: Good. Cooperative with all assigned tasks.   Clinical Decision Making: Repeat battery from initial evaluation (10/06/19); partially competed.    Tests Administered:   Wechsler Adult Intelligence Scale, 4th Edition (WAIS-IV)  She did not appear to have difficulty seeing, hearing, or understanding test items/questions; did not require much additional prompting. She exhibited adequate distress tolerance on questions she did not know or tasks that were more difficult. She appeared somewhat tired and complained of fatigue. Described feeling dizzy the past few months which leads to significant frustration. Experienced lightheadedness upon getting up from supine position.   Results: To be included once testing and scoring is complete. Full report to follow.  Plan:  Patient will contact front office tomorrow and schedule another 2-hour testing appointment in order to complete measures of learning and memory (WMS-IV-Older Adult Battery), aspects of executive functioning (I.e., Trails B, M-WCST), and language (I.e., verbal fluency and confrontational naming).      PLAN: The patient will return to complete the above referenced full  battery of neuropsychological testing with a psychometrician under my supervision. Education regarding testing procedures was provided to the patient. Subsequently, the patient will see this provider for a follow-up session at which time her test performances and my impressions and treatment recommendations will be reviewed in detail.  Evaluation ongoing; full report to follow.

## 2020-09-13 ENCOUNTER — Other Ambulatory Visit: Payer: Medicare PPO

## 2020-10-07 ENCOUNTER — Other Ambulatory Visit: Payer: Self-pay

## 2020-10-07 ENCOUNTER — Encounter: Payer: Self-pay | Admitting: Psychology

## 2020-10-07 ENCOUNTER — Encounter: Payer: Medicare PPO | Attending: Psychology | Admitting: Psychology

## 2020-10-07 DIAGNOSIS — R413 Other amnesia: Secondary | ICD-10-CM

## 2020-10-07 DIAGNOSIS — I110 Hypertensive heart disease with heart failure: Secondary | ICD-10-CM | POA: Diagnosis not present

## 2020-10-07 DIAGNOSIS — R4189 Other symptoms and signs involving cognitive functions and awareness: Secondary | ICD-10-CM | POA: Insufficient documentation

## 2020-10-07 DIAGNOSIS — Z8542 Personal history of malignant neoplasm of other parts of uterus: Secondary | ICD-10-CM | POA: Insufficient documentation

## 2020-10-07 DIAGNOSIS — I5032 Chronic diastolic (congestive) heart failure: Secondary | ICD-10-CM | POA: Insufficient documentation

## 2020-10-07 NOTE — Progress Notes (Addendum)
Neuropsychology Note Terri Ortega completed another 120 minutes of repeat neuropsychological testing with this provider. She did not appear to have difficulty seeing, hearing, or understanding test items/questions; did not require much additional prompting. She exhibited adequate distress tolerance on questions she did not know or tasks that were more difficult.    Behavioral Observations:  Appearance: Casually and appropriately dressed with good hygiene. Gait: Ambulated with cane for assistance. Left sided weakness (e.g., drag left leg) Speech: Clear, normal rate, normal tone & volume. Thought process: Goal directed, Linear, and Logical.  Mood/Affect: Mildly depressed. Appropriate.  Interpersonal: Polite and appropriate. Orientation: Oriented x 4 Effort/Motivation: Good. Cooperative with all assigned tasks.   Tests Administered:   Controlled Oral Word Association Test (COWAT; FAS & Animals)  Modified [employer] (M-WCST)   Trail Making Test (TMT)   Wechsler Memory Scale, 4th Edition (WMS-IV); Older Adult Battery  Results:       Raw Score  T-Score  Percentile  Description  COWAT   FAS    13  27  1   Exceptionally Low    Animals   5  18  <1  Exceptionally Low    M-WCST      Raw Score  T-Score  Percentile  Description   # Categories Correct   4  42  21  Below Average  # Perseverative Errors 18  30  2   Borderline  # Total Errors    19  45  31  Average   % Perseverative Errors  92  28  1  Mildly Abnormal  EF Composite     SS=77  6  Borderline                 Scaled Score   TMT  Part A    80"  6  9  Below Average      Error=0  Part B    191"   7  16  Below Average      Error=1 *Based on Mayo's Older Normative Studies (MOANS)  WAIS-IV   Composite Score Summary  Scale Sum of Scaled Scores Composite Score Percentile Rank 95% Conf. Interval Qualitative Description  Verbal Comprehension 32 VCI 103 58 97-109 Average  Perceptual Reasoning 25  PRI 90 25 84-97 Average  Working Memory 15 WMI 86 18 80-94 Low Average  Processing Speed 13 PSI 81 10 75-91 Low Average  Full Scale 85 FSIQ 89 23 85-93 Low Average  General Ability 57 GAI 97 42 92-102 Average   Verbal Comprehension Subtests Summary  Subtest Raw Score Scaled Score Percentile Rank Reference Group Scaled Score SEM  Similarities 21 10 50 8 0.90  Vocabulary 43 12 75 12 0.73  Information 12 10 50 9 0.73   Perceptual Reasoning Subtests Summary  Subtest Raw Score Scaled Score Percentile Rank Reference Group Scaled Score SEM  Block Design 24 10 50 6 1.34  Matrix Reasoning 6 7 16 3  1.12  Visual Puzzles 7 8 25 5  1.27    Working Doctor, general practice Raw Score Scaled Score Percentile Rank Reference Group Scaled Score SEM  Digit Span 18 8 25 5  0.85  Arithmetic 8 7 16 5  0.99   Working Memory Process Score Summary  Process Score Raw Score Scaled Score Percentile Rank Base Rate SEM  Digit Span Forward 9 9 37 -- 1.31  Digit Span Backward 4 6 9  -- 1.44  Digit Span Sequencing 5 9 37 -- 1.20  Longest  Digit Span Forward 6 -- -- 66.0 --  Longest Digit Span Backward 2 -- -- 99.0 --  Longest Digit Span Sequence 4 -- -- 84.0 --   Processing Speed Subtests Summary  Subtest Raw Score Scaled Score Percentile Rank Reference Group Scaled Score SEM  Symbol Search 11 7 16 2  1.12  Coding 21 6 9 1  1.12   WMS-IV Older Adult Battery  Index Score Summary  Index Sum of Scaled Scores Index Score Percentile Rank 95% Confidence Interval Qualitative Descriptor  Auditory Memory (AMI) 58 128 97 120-133 Superior  Visual Memory (VMI) 20 100 50 95-105 Average  Immediate Memory (IMI) 37 115 84 108-120 High Average  Delayed Memory (DMI) 41 125 95 115-131 Superior    Primary Subtest Scaled Score Summary  Subtest Domain Raw Score Scaled Score Percentile Rank  Logical Memory I AM 37 14 91  Logical Memory II AM 22 13 84  Verbal Paired Associates I AM 27 14 91  Verbal Paired  Associates II AM 10 17 99  Visual Reproduction I VM 23 9 37  Visual Reproduction II VM 17 11 63  Symbol Span VWM 5 5 5    Auditory Memory Process Score Summary  Process Score Raw Score Scaled Score Percentile Rank Cumulative Percentage (Base Rate)  LM II Recognition 20 - - >75%  VPA II Recognition 29 - - >75%    Visual Memory Process Score Summary  Process Score Raw Score Scaled Score Percentile Rank Cumulative Percentage (Base Rate)  VR II Recognition 4 - - 51-75%    Predicted Difference Method   Index Predicted WMS-IV Index Score Actual WMS-IV Index Score Difference Critical Value  Significant Difference Y/N Base Rate  Auditory Memory 98 128 -30 9.33 Y 1%  Visual Memory 98 100 -2 7.72 N   Immediate Memory 98 115 -17 10.41 Y 5-10%  Delayed Memory 98 125 -27 10.86 Y 1-2%  Statistical significance (critical value) at the .01 level.    ABILITY-MEMORY ANALYSIS  Ability Score:  GAI: 97 Date of Testing:  WAIS-IV; WMS-IV 2020/09/02  Feedback to Patient:  Terri Ortega will return on 11/04/20 for an interactive feedback session with Dr. Sima Matas at which time her test performances, clinical impressions and treatment recommendations will be reviewed in detail. The patient understands she can contact our office should she require our assistance before this time.  Evaluation ongoing; full report to follow.

## 2020-10-16 ENCOUNTER — Other Ambulatory Visit: Payer: Self-pay

## 2020-10-16 ENCOUNTER — Encounter (HOSPITAL_BASED_OUTPATIENT_CLINIC_OR_DEPARTMENT_OTHER): Payer: Medicare PPO | Admitting: Psychology

## 2020-10-16 DIAGNOSIS — Z8542 Personal history of malignant neoplasm of other parts of uterus: Secondary | ICD-10-CM | POA: Diagnosis not present

## 2020-10-16 DIAGNOSIS — R4189 Other symptoms and signs involving cognitive functions and awareness: Secondary | ICD-10-CM | POA: Diagnosis not present

## 2020-10-16 DIAGNOSIS — R413 Other amnesia: Secondary | ICD-10-CM | POA: Diagnosis not present

## 2020-10-16 DIAGNOSIS — I5032 Chronic diastolic (congestive) heart failure: Secondary | ICD-10-CM | POA: Diagnosis not present

## 2020-10-16 DIAGNOSIS — I110 Hypertensive heart disease with heart failure: Secondary | ICD-10-CM | POA: Diagnosis not present

## 2020-10-24 DIAGNOSIS — L218 Other seborrheic dermatitis: Secondary | ICD-10-CM | POA: Diagnosis not present

## 2020-10-24 DIAGNOSIS — L720 Epidermal cyst: Secondary | ICD-10-CM | POA: Diagnosis not present

## 2020-10-24 DIAGNOSIS — L280 Lichen simplex chronicus: Secondary | ICD-10-CM | POA: Diagnosis not present

## 2020-11-04 ENCOUNTER — Other Ambulatory Visit: Payer: Self-pay

## 2020-11-04 ENCOUNTER — Encounter: Payer: Medicare PPO | Attending: Psychology | Admitting: Psychology

## 2020-11-04 DIAGNOSIS — R4189 Other symptoms and signs involving cognitive functions and awareness: Secondary | ICD-10-CM | POA: Diagnosis not present

## 2020-11-04 DIAGNOSIS — R413 Other amnesia: Secondary | ICD-10-CM | POA: Insufficient documentation

## 2020-11-12 NOTE — Progress Notes (Signed)
[facility]    Provider:  Dr Jaynee Eagles Requesting Provider: [name], MD Primary Care Provider:  [name], MD  CC:  New chief complaint Dizziness  HPI 11/12/2020:  Terri Ortega is a 80 y.o. female here as requested by [name], MD for dizziness. PMHx hypothyroidism, hypertension, obstructive sleep apnea (borderline not needing CPAP), diastolic congestive heart failure, osteoporosis, remote uterine cancer s/p hysterectomy 2015, memory difficulties and had a thorough evaluation including formal memory testing which was not consistent with a neurodegenerative disorder at this time.  She feels dizzy, here with her daughter, less than 6 months ago, she got out of bed ane morning and she fell, she didn't bump her head, she did not pass out, she felt dizzy. In the last 2 weeks she is comfortable because she is lightheaded, dizzy, the room may spin or she feels like there is movement when there is not, always when standing or walking, worse when she first gets up in the morning, she is not following instructions or wearing her compression stockings. She doesn't drink a lot of fluids, she had a sleep evaluation. No neck pain. Daughter thinks she may have some depression, we discussed that today, They don;t want to [place], we can monitor. No other focal neurologic deficits, associated symptoms, inciting events or modifiable factors.  I reviewed Dr. Lina Sar new notes, patient transiently with change of position dizziness, orthostatics negative, they stopped her diuretics, she was encouraged to wear support stockings, push fluids, CT of the head was negative for any acute pathology, referred back to neurology for further evaluation of dizziness.  At last appointment in September 09, 2020 she still complained of some dizziness with change of position, she did sustain a fall because of dizziness, again CT of her head was negative for any acute pathology, she did voice  concern for not eating as much and not drinking as much fluids therefore they stopped her diuretics, she was complaining of feeling dizzy when going from sitting to lying, lying to sitting, otherwise neuro exam was nonfocal.  Patient complains of symptoms per HPI as well as the following symptoms: dizziness, memory difficulty . Pertinent negatives and positives per HPI. All others negative  Interval history Dec 25, 2019: Here with husband and daughter to review work-up.  Reviewed MRI of the brain images as below, also reviewed findings of formal memory testing which were not consistent with degenerative dementia such as Alzheimer's, Lewy body or other cortical dementia, there was some pattern of lateralization to performance with almost all her deficits in the areas of visual processing and visual cognitive functioning, it could be very early in picking up some cognitive changes they will retest her in 9 months.  We offered her a sleep test due to her snoring and difficulty with achieving good restful sleep but she declined.  No significant depression or anxiety.  Answered all questions.  1.   Mild to moderate generalized cortical atrophy. 2.   T2/FLAIR hyperintense foci in the hemispheres consistent with moderate chronic microvascular ischemic change. 3.   Mild right chronic maxillary sinusitis. 4.   There were no acute findings and there is a normal enhancement pattern.  HPI:  Terri Ortega is a 80 y.o. female here as requested by [name], MD for memory loss. PMHx high blood pressure, uterine cancer, osteoporosis, hypothyroidism, memory difficulties, diastolic congestive heart failure, overweight.  I reviewed notes from Dr. Delfina Redwood, B12 458, TSH 4.27.  Patient recently presented for  memory evaluation, patient considering better memory, she scored 26 out of 30 on the Mini-Mental status exam, I am not sure but the note states possibly she has been part of a financial scheme or the victim I am  not sure, she could not recall any other specific issues with her memory, she did report being under a great deal expressed about the election in the pandemic.  I reviewed the report of a head CT from 2004, patient had a fall, head CT without contrast, no evidence of fracture or intracranial hemorrhage, mild atrophy, overall unremarkable per report (too old to be able to access images). She is here with her daughter who also provides much information. She had knee surgery in 01/2019 and she has improved but it is still hard for her, her knee hurts when she walks. She is very independent, she drives to the grocery store, still driving well, no falls, she does not think her memory has been a problem even since getting anesthesia, she feels her husband's memory problems are so obvious and he knows it, it is frustrating for her husband and she notices it, moreso names of people and she notices it, she hekps him keep appointments. They visit with family and grandchildren, she still enjoys socializing, seeing family, its hard with Covid and isolation and they can't see relatives such as her sister in law who is close by but can't spend time together. They grow collards in their garden. She does notice some memory changes in herself. Daughter says they feel patient's logic is impaired and some memory, the logic is impaired, she still texts but she cn get mixed up sometime, she had a scammer call her on the phone and a few years ago she gave $300 to someone she had no idea, to pay someone to keep her computer updated, he called again and fortunately she did not lose anything again and she gave up talking to him and went to her bank and told them what had happened and they pulled up her account. The week before last she started to pull up the [employer] site for her husband she pays the bills and suddenly on her monitor she saw menus she called the number and he wanted $200 and she declined. Her mother died young, she was a  foster child, she doesn't know her father. She likes to take a nap in  The afternoon. Discussed POA and HCPOA. She snores heavily, she drools on her pillow, she denies vivid dreams, no shaking, no shuffling, no depression or anxiety.   Reviewed notes, labs and imaging from outside physicians, which showed:   See above  Review of Systems: Patient complains of symptoms per HPI as well as the following symptoms:dizziness, vertigo . Pertinent negatives and positives per HPI. All others negative    Social History   Socioeconomic History  . Marital status: Married    Spouse name: Not on file  . Number of children: 3  . Years of education: Not on file  . Highest education level: High school graduate  Occupational History  . Occupation: Chartered certified accountant  Tobacco Use  . Smoking status: Never Smoker  . Smokeless tobacco: Never Used  Vaping Use  . Vaping Use: Never used  Substance and Sexual Activity  . Alcohol use: Never  . Drug use: Never  . Sexual activity: Not Currently  Other Topics Concern  . Not on file  Social History Narrative   Lives at home with husband, Rush [place].  Four grand and five great grands.     Caffeine: diet coke about 1-2 per day    Right handed   Social Determinants of Health   Financial Resource Strain: Not on file  Food Insecurity: Not on file  Transportation Needs: Not on file  Physical Activity: Not on file  Stress: Not on file  Social Connections: Not on file  Intimate Partner Violence: Not on file    Family History  Adopted: Yes  Problem Relation Age of Onset  . Heart disease Mother 45       No details    Past Medical History:  Diagnosis Date  . Graves disease   . HTN (hypertension)   . Low back pain    Dr. Ronnald Ramp  . Osteoporosis   . Pneumonia 2012  . Radiation 03/20/14, 03/27/14, 04/05/14, 04/12/14, 04/17/14   bracytherapy to proximal vagina 30 gray  . Scoliosis   . Uterine cancer ([place])    surgery and radiation x 5 treatments-last 9'15   . Vitamin D deficiency     Patient Active Problem List   Diagnosis Date Noted  . Orthostatic dizziness 11/14/2020  . Educated about COVID-19 virus infection 12/21/2019  . Cognitive change 09/06/2019  . 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 Preop cardiovascular exam 12/20/2018  . 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 Uterine cancer ([place]) 03/28/2018  . Arthralgia of hip or thigh 03/28/2018  . Benign essential HTN 03/28/2018  . Overweight 03/28/2018  . Heart disease 11/09/2014  . Leukocytosis 10/31/2014  . Diastolic congestive heart failure ([place]) 10/30/2014  . Hypothyroidism 10/30/2014  . Chest pain at rest 10/30/2014  . Chest pain 10/30/2014  . Endometrial cancer ([place]) 01/30/2014  . Dyspnea 01/11/2014  . Edema 01/11/2014  . Low back pain with right-sided sciatica 12/13/2013  . Degeneration of lumbar or lumbosacral intervertebral disc 12/13/2013  . Sciatica 12/13/2013    Past Surgical History:  Procedure Laterality Date  . BUNIONECTOMY     left  . CATARACT EXTRACTION, BILATERAL Bilateral    last done 11-21-14  . COLONOSCOPY WITH PROPOFOL N/A 12/25/2014   Procedure: COLONOSCOPY WITH PROPOFOL;  Surgeon: Garlan Fair, MD;  Location: [facility];  Service: Endoscopy;  Laterality: N/A;  . FOOT SURGERY Left 2004   Dr Johnnye Sima  . JOINT REPLACEMENT     RTKA  . ROBOTIC ASSISTED TOTAL HYSTERECTOMY WITH BILATERAL SALPINGO OOPHERECTOMY  01/30/14   with lymph node biopsy  . TOTAL KNEE ARTHROPLASTY     right  . TOTAL KNEE REVISION Right 01/04/2019   Procedure: TOTAL KNEE REVISION;  Surgeon: Gaynelle Arabian, MD;  Location: [facility];  Service: Orthopedics;  Laterality: Right;  179min with block    Current Outpatient Medications  Medication Sig Dispense Refill  . amoxicillin (AMOXIL) 500 MG capsule Take 2,000 mg by mouth See admin instructions. Take 2000 mg 1 hour prior to dental work    . Aspirin  Effervescent (ALKA-SELTZER PO) Take by mouth.    . Cholecalciferol (VITAMIN D3 PO) Take 2,000 Int'l Units by mouth daily.    Marland Kitchen donepezil (ARICEPT) 5 MG tablet Take 1 tablet (5 mg total) by mouth at bedtime. 30 tablet 11  . levothyroxine (SYNTHROID) 88 MCG tablet Take 88 mcg by mouth daily before breakfast.     . Multiple Vitamins-Minerals (CENTRUM SILVER PO) Take by mouth.    . Naproxen Sodium (ALEVE PO) Take 220 mg by mouth as needed.    Marland Kitchen  OVER THE COUNTER MEDICATION Tums     No current facility-administered medications for this visit.    Allergies as of 11/13/2020 - Review Complete 11/13/2020  Allergen Reaction Noted  . Percocet [oxycodone-acetaminophen] Nausea And Vomiting 09/06/2019    Vitals: BP (!) 181/78 (BP Location: Left Arm, Patient Position: Supine)   Pulse (!) 59   Ht 5\' 3"  (1.6 m)   Wt 185 lb (83.9 kg)   BMI 32.77 kg/m  Last Weight:  Wt Readings from Last 1 Encounters:  11/13/20 185 lb (83.9 kg)   Last Height:   Ht Readings from Last 1 Encounters:  11/13/20 5\' 3"  (1.6 m)     Physical exam: Exam: Gen: NAD, conversant, well nourised, obese, well groomed                     CV: RRR, no MRG. No Carotid Bruits. No peripheral edema, warm, nontender Eyes: Conjunctivae clear without exudates or hemorrhage  Neuro: Detailed Neurologic Exam  Speech:    Speech is normal; fluent and spontaneous with normal comprehension.  Cognition:  MMSE - Mini Mental State Exam 09/06/2019  Orientation to time 5  Orientation to Place 5  Registration 3  Attention/ Calculation 2  Recall 3  Language- name 2 objects 2  Language- repeat 1  Language- follow 3 step command 3  Language- read & follow direction 1  Write a sentence 1  Copy design 0  Total score 26       The patient is oriented to person, place, and time;     recent and remote memory intact;     language fluent;     normal attention, concentration,     fund of knowledge and judgement impaired Cranial Nerves:     The pupils are equal, round, and reactive to light.  Attempted funduscopy pupils to small visual fields are full to finger confrontation. Extraocular movements are intact. Trigeminal sensation is intact and the muscles of mastication are normal. The face is symmetric. The palate elevates in the midline. Hearing intact. Voice is normal. Shoulder shrug is normal. The tongue has normal motion without fasciculations.   Coordination:    Normal finger to nose.  Gait:    Heel-toe and tandem gait with imbalance, antalgic due to knee pain  Motor Observation:    No asymmetry, no atrophy, and no involuntary movements noted. Tone:    Normal muscle tone.    Posture:    Posture is normal. normal erect    Strength:    Strength is V/V in the upper and lower limbs.      Sensation: intact to LT     Reflex Exam:  DTR's:    Deep tendon reflexes in the upper and lower extremities are symmetrical and 1+-2+ bilaterally.   Toes:    The toes are equiv bilaterally.   Clonus:    Clonus is absent.    Assessment/Plan: This is an extremely lovely 80 year old patient here with her equally lovely daughter for concerns of memory loss or more appropriately possibly poor judgment.  She is 80 years old, she is quite independent, she still drives, she lives with her husband, however I am getting concerned when I hear things like she has been the victim of scammer, I do think possibly she has some impaired judgment and insight, at this time daughters concerned and I do think she should have formal evaluation including MRI of the brain, formal neurocognitive testing, also daughter is concerned about sleeping and  whether sleep apnea may be having an effect, patient denies symptoms but I am not quite sure she is a good historian, daughter is worried about her excessive snoring and patient takes a nap during the day. Her son-in-lase is Mardene Sayer, a pcp in this area whose mother suffered from dementia and he is  concerned about his mother in law who is our patient.  - Patient is here for a new request today, patient gets dizzy/lightheaded when standing. Also having episodes of room spinning/feeling of movement when there is not.  Today in clinic her blood pressure did drop from 196/84 to 178/85 when standing for 3 minutes.  I do think this is mild orthostasis and I agree with the physician that patient needs to stand up carefully, wear compression stockings(she is not doing that although she was informed to), manage blood pressure with primary care, and for the vertiginous quality we can send her for vestibular therapy. Daughter thinks she may have some depression, we discussed that today and will monitor clinically, patient will start aricept today.   - We can start a low dose of Aricept, going to cardiology next Monday just check in with them because pulse 59.   Will review Dr. Ferne Coe notes and sleep study (addendum I reviewed sleep study, dated August 2021, borderline sleep apnea, no indication for CPAP or any other intervention for sleep disordered breathing) we were going to send her to dr dohmeier not sue what happened. Her husband was diagnosed with sleep apnea cannot tolerate the cpap did I send note to casey/dohmeier about aspire or dental device?  Orders Placed This Encounter  Procedures  . Ambulatory referral to Physical Therapy   Meds ordered this encounter  Medications  . donepezil (ARICEPT) 5 MG tablet    Sig: Take 1 tablet (5 mg total) by mouth at bedtime.    Dispense:  30 tablet    Refill:  11    PRIOR Appointment A/P: - MRI of the brain for reversible causes of dementia: Atrophy and chronic microvascular changes. -  findings of formal memory testing which were not consistent with degenerative dementia such as Alzheimer's, Lewy body or other cortical dementia, there was some pattern of lateralization to performance with almost all her deficits in the areas of visual processing and  visual cognitive functioning -May consider FDG PET scan in the future.  They are set up for repeat testing in 9 months and I do think that it is appropriate to wait and retest formal memory at that time.  Return to clinic after that.  Cc: [name], MD  Sarina Ill, MD  [facility] [address] [place], [place] [address]  Phone [phone] Fax [phone]  I spent over 45 minutes of face-to-face and non-face-to-face time with patient on the  1. Dizziness   2. Orthostatic dizziness   3. Vertigo    diagnosis.  This included previsit chart review, lab review, study review, order entry, electronic health record documentation, patient education on the different diagnostic and therapeutic options, counseling and coordination of care, risks and benefits of management, compliance, or risk factor reduction

## 2020-11-13 ENCOUNTER — Telehealth: Payer: Self-pay | Admitting: Neurology

## 2020-11-13 ENCOUNTER — Ambulatory Visit: Payer: Medicare PPO | Admitting: Neurology

## 2020-11-13 ENCOUNTER — Encounter: Payer: Self-pay | Admitting: Neurology

## 2020-11-13 ENCOUNTER — Other Ambulatory Visit: Payer: Self-pay

## 2020-11-13 VITALS — BP 181/78 | HR 59 | Ht 63.0 in | Wt 185.0 lb

## 2020-11-13 DIAGNOSIS — R42 Dizziness and giddiness: Secondary | ICD-10-CM | POA: Diagnosis not present

## 2020-11-13 MED ORDER — DONEPEZIL HCL 5 MG PO TABS: 5.0000 mg | ORAL_TABLET | Freq: Every day | ORAL | 11 refills | Status: DC

## 2020-11-13 NOTE — Telephone Encounter (Signed)
Hilda Blades, can you get me Ms. Sweetman sleep study results and report from Dr. Isidoro Donning at [facility]. They referred her to [place] so we should not need any release of information papers thanks

## 2020-11-13 NOTE — Progress Notes (Signed)
Orthostatics   BP supine (x 5 minutes) -- 181/78  HR supine (x 5 minutes) -- 59  BP sitting -- 195/84  HR sitting -- 63  BP standing (after 1 minute) -- 196/84  HR standing (after 1 minute) -- 65  BP standing (after 3 minutes) -- 178/85  HR standing (after 3 minutes) -- 66  Orthostatics Comment -- room spinning sensation upon sitting up, improved while standing.

## 2020-11-13 NOTE — Telephone Encounter (Signed)
Hilda Blades gave me the report and I have given it to Dr Jaynee Eagles for review.

## 2020-11-13 NOTE — Patient Instructions (Signed)
Physical Therapy for vestibular therapy for vertigo Start low dose Aricept (check with cardiology) Consider some zoloft Will review Dr. Ferne Coe notes and sleep study  Donepezil Oral Dissolving Tablet What is this medicine? DONEPEZIL (doe NEP e zil) is used to treat mild to moderate dementia caused by Alzheimer's disease. This medicine may be used for other purposes; ask your health care provider or pharmacist if you have questions. COMMON BRAND NAME(S): Aricept What should I tell my health care provider before I take this medicine? They need to know if you have any of these conditions:  asthma or other lung disease  difficulty passing urine  head injury  heart disease  history of irregular heartbeat  liver disease  seizures (convulsions)  stomach or intestinal disease, ulcers or stomach bleeding  an unusual or allergic reaction to donepezil, other medicines, foods, dyes, or preservatives  pregnant or trying to get pregnant  breast-feeding How should I use this medicine? Take this medicine by mouth. Follow the directions on the prescription label. Place the tablet in the mouth and allow it to dissolve, then swallow. While you may take these tablets with water, it is not necessary to do so. You may take this medicine with or without food. Take your doses at regular intervals. This medicine is usually taken before bedtime. Do not take your medicine more often than directed. Continue to take your medicine even if you feel better. Do not stop taking except on the advice of your doctor or health care professional. Talk to your pediatrician regarding the use of this medicine in children. Special care may be needed. Overdosage: If you think you have taken too much of this medicine contact a poison control center or emergency room at once. NOTE: This medicine is only for you. Do not share this medicine with others. What if I miss a dose? If you miss a dose, take it as soon as you  can. If it is almost time for your next dose, take only that dose. Do not take double or extra doses. What may interact with this medicine? Do not take this medicine with any of the following medications:  certain medicines for fungal infections like itraconazole, fluconazole, posaconazole, and voriconazole  cisapride  dextromethorphan; quinidine  dronedarone  pimozide  quinidine  thioridazine This medicine may also interact with the following medications:  antihistamines for allergy, cough and cold  atropine  bethanechol  carbamazepine  certain medicines for bladder problems like oxybutynin, tolterodine  certain medicines for Parkinson's disease like benztropine, trihexyphenidyl  certain medicines for stomach problems like dicyclomine, hyoscyamine  certain medicines for travel sickness like scopolamine  dexamethasone  dofetilide  ipratropium  NSAIDs, medicines for pain and inflammation, like ibuprofen or naproxen  other medicines for Alzheimer's disease  other medicines that prolong the QT interval (cause an abnormal heart rhythm)  phenobarbital  phenytoin  rifampin, rifabutin or rifapentine  ziprasidone This list may not describe all possible interactions. Give your health care provider a list of all the medicines, herbs, non-prescription drugs, or dietary supplements you use. Also tell them if you smoke, drink alcohol, or use illegal drugs. Some items may interact with your medicine. What should I watch for while using this medicine? Visit your doctor or health care professional for regular checks on your progress. Check with your doctor or health care professional if your symptoms do not get better or if they get worse. You may get drowsy or dizzy. Do not drive, use machinery, or do anything that  needs mental alertness until you know how this drug affects you. What side effects may I notice from receiving this medicine? Side effects that you should  report to your doctor or health care professional as soon as possible:  allergic reactions like skin rash, itching or hives, swelling of the face, lips, or tongue  feeling faint or lightheaded, falls  loss of bladder control  seizures  signs and symptoms of a dangerous change in heartbeat or heart rhythm like chest pain; dizziness; fast or irregular heartbeat; palpitations; feeling faint or lightheaded, falls; breathing problems  signs and symptoms of infection like fever or chills; cough; sore throat; pain or trouble passing urine  signs and symptoms of liver injury like dark yellow or brown urine; general ill feeling or flu-like symptoms; light-colored stools; loss of appetite; nausea; right upper belly pain; unusually weak or tired; yellowing of the eyes or skin  slow heartbeat or palpitations  unusual bleeding or bruising  vomiting Side effects that usually do not require medical attention (report to your doctor or health care professional if they continue or are bothersome):  diarrhea, especially when starting treatment  headache  loss of appetite  muscle cramps  nausea  stomach upset This list may not describe all possible side effects. Call your doctor for medical advice about side effects. You may report side effects to FDA at 1-800-FDA-1088. Where should I keep my medicine? Keep out of reach of children. Store at room temperature between 15 and 30 degrees C (59 and 86 degrees F). Throw away any unused medicine after the expiration date. NOTE: This sheet is a summary. It may not cover all possible information. If you have questions about this medicine, talk to your doctor, pharmacist, or health care provider.  2021 Elsevier/Gold Standard (2018-07-11 10:24:00)   Non-Drug Treatment for Low Blood Pressure on Standing:  1. Changing Postures: . Change posture slowly when getting up, especially in the morning . Hold on to something during the first few minutes after  standing up. Do not start walking as soon as you get up from the chair . Avoid prolonged recumbency or lying down . Raise the head of the bed by 10 to 20 degrees  2. Exercise: . Perform Isotonic exercise, e.g.recumbent bike, pedaling movements while sitting in a chair . Avoid exercises where you have to strain   3. Avoid Pooling of blood in legs: . Wear custom-fitted elastic stockings. The ones which extend to the abdomen work even better. Consider wearing an abdominal binder. . Perform physical counter-maneuvers, such as crossing legs and tensing leg muscles.  4. Eating and Drinking: . Small meals are recommended. Avoid large meals. Avoid standing suddenly after a large meal . Avoid alcohol . Increase intake of fluids and regular salt. A daily intake of up to 10 grams of sodium per day and a fluid intake of 2.0 to 2.5 liters per day (8 to 10 glasses of water) is recommended.  . Rapid (over 3 minutes) ingestion of approximately 0.5 liter (2 glasses of water) of tap water, raises blood pressure within 5 to 15 minutes and lasts for an hour.  5. Other Tips: . Avoid hot baths. Instead take warm baths . Maintain a BP record standing and lying down . If you are only any BP lowering drugs (antihypertensives, diuretics, antidepressants, drugs for prostate, etc) ask your doctor to revisit the need to keep you on these drugs . If all non-drug therapy fails, ask your doctor about drug therapy  Follow-up with primary care physician.

## 2020-11-13 NOTE — Telephone Encounter (Signed)
Request made. I have called the office 2 times. Still waiting on the  Sleep report.

## 2020-11-14 DIAGNOSIS — R42 Dizziness and giddiness: Secondary | ICD-10-CM | POA: Insufficient documentation

## 2020-11-18 ENCOUNTER — Telehealth: Payer: Self-pay | Admitting: Neurology

## 2020-11-18 NOTE — Telephone Encounter (Signed)
Would you call daughter and let her know that I reviewed the sleep study report and patient has borderline sleep apnea, nothing significant whatsoever, no need for cpap or any other device. I contacted Dr. Sima Matas and there was a chart problem, he is going to fix that this week and I will get back to them after I read his summary thanks!

## 2020-11-18 NOTE — Telephone Encounter (Signed)
Spoke with pt's daughter [name] and discussed Dr Cathren Laine message as noted in detail below. [name] verbalized understanding and appreciation for the call.

## 2020-11-26 ENCOUNTER — Telehealth: Payer: Self-pay | Admitting: *Deleted

## 2020-11-26 NOTE — Telephone Encounter (Signed)
While on phone with pt's daughter [name] (on [place]) regarding husband, she mentioned pt had not heard from PT yet and her dizziness had gotten worse. She was provided with PT at [facility] phone number to call to schedule. She verbalized appreciation.

## 2020-12-03 ENCOUNTER — Ambulatory Visit: Payer: Medicare PPO | Attending: Neurology | Admitting: Physical Therapy

## 2020-12-03 ENCOUNTER — Other Ambulatory Visit: Payer: Self-pay

## 2020-12-03 VITALS — BP 156/80 | HR 64

## 2020-12-03 DIAGNOSIS — R2681 Unsteadiness on feet: Secondary | ICD-10-CM

## 2020-12-03 DIAGNOSIS — R42 Dizziness and giddiness: Secondary | ICD-10-CM | POA: Diagnosis not present

## 2020-12-03 DIAGNOSIS — H8111 Benign paroxysmal vertigo, right ear: Secondary | ICD-10-CM | POA: Diagnosis not present

## 2020-12-04 ENCOUNTER — Encounter: Payer: Self-pay | Admitting: Physical Therapy

## 2020-12-04 NOTE — Therapy (Signed)
[facility] [address] [place], [place], [address] Phone: [phone]   Fax:  [phone]  Physical Therapy Evaluation  Patient Details  Name: Terri Ortega MRN: [id number] Date of Birth: [birth year] Referring Provider (PT): Dr. Delfin Edis   Encounter Date: 12/03/2020   PT End of Session - 12/04/20 1306    Visit Number 1    Number of Visits 5    Date for PT Re-Evaluation 01/03/21    Authorization Type [employer]    Authorization Time Period 12-03-20 - 02-02-21    PT Start Time 1105    PT Stop Time 1145    PT Time Calculation (min) 40 min    Activity Tolerance Patient tolerated treatment well    Behavior During Therapy [facility] for tasks assessed/performed           Past Medical History:  Diagnosis Date  . Graves disease   . HTN (hypertension)   . Low back pain    Dr. Ronnald Ramp  . Osteoporosis   . Pneumonia 2012  . Radiation 03/20/14, 03/27/14, 04/05/14, 04/12/14, 04/17/14   bracytherapy to proximal vagina 30 gray  . Scoliosis   . Uterine cancer ([place])    surgery and radiation x 5 treatments-last 9'15  . Vitamin D deficiency     Past Surgical History:  Procedure Laterality Date  . BUNIONECTOMY     left  . CATARACT EXTRACTION, BILATERAL Bilateral    last done 11-21-14  . COLONOSCOPY WITH PROPOFOL N/A 12/25/2014   Procedure: COLONOSCOPY WITH PROPOFOL;  Surgeon: Garlan Fair, MD;  Location: [facility] END[facility];  Service: Endoscopy;  Laterality: N/A;  . FOOT SURGERY Left 2004   Dr Johnnye Sima  . JOINT REPLACEMENT     RTKA  . ROBOTIC ASSISTED TOTAL HYSTERECTOMY WITH BILATERAL SALPINGO OOPHERECTOMY  01/30/14   with lymph node biopsy  . TOTAL KNEE ARTHROPLASTY     right  . TOTAL KNEE REVISION Right 01/04/2019   Procedure: TOTAL KNEE REVISION;  Surgeon: Gaynelle Arabian, MD;  Location: [facility] ORS;  Service: Orthopedics;  Laterality: Right;  158min with block    Vitals:   12/03/20 1131  BP: (!) 156/80  Pulse: 64       Subjective Assessment - 12/03/20 1107    Subjective Pt reports she has had the dizziness for approx. 6 months - started using SPC about this time; has changed and is now using small tripod based cane; has light-headedness with lying down and with supine to sit; pt states she occasionally uses the cane in the house.  Pt reports she had 1 fall in early [name]. - does not know what caused her to fall. Thinks she neglected to wait a few seconds and swing feet and therefore, fell to floor.    Pertinent History orthostatic hypotension, h/o uterine cancer with surgery & radiation, Graves disease, HTN, osteoporosis, failed Rt TKA    Patient Stated Goals "Be able to walk comfortably"; states children gave her a recumbent bike for Christmas and she uses it twice a day    Currently in Pain? No/denies              [facility] PT Assessment - 12/04/20 0001      Assessment   Medical Diagnosis Dizziness    Referring Provider (PT) Dr. Delfin Edis    Onset Date/Surgical Date --   approx. [name]. 2021; referral date 11-13-20     Precautions   Precautions Fall      Balance Screen  Has the patient fallen in the past 6 months Yes    How many times? 1    Has the patient had a decrease in activity level because of a fear of falling?  Yes    Is the patient reluctant to leave their home because of a fear of falling?  No      Observation/Other Assessments   Focus on Therapeutic Outcomes (FOTO)  45/100 pt's FS primary measure:  risk adjusted 54/100:  DPS score 55/100      Ambulation/Gait   Ambulation/Gait Yes    Ambulation/Gait Assistance 6: Modified independent (Device/Increase time)    Ambulation Distance (Feet) 50 Feet    Assistive device Straight cane   with small quad tip   Gait Pattern Step-through pattern    Ambulation Surface Level;Indoor                  Vestibular Assessment - 12/04/20 0001      Symptom Behavior   Subjective history of current problem pt reports she has had the  dizziness for approx. 6 months - states she gets dizzy when she turns over in bed and also when she stands up from sitting position    Type of Dizziness  Imbalance;Spinning;Unsteady with head/body turns;Lightheadedness    Frequency of Dizziness daily    Duration of Dizziness lasts for couple of minutes    Symptom Nature Motion provoked    Aggravating Factors Looking up to the ceiling;Lying supine;Turning body quickly;Turning head quickly;Supine to sit;Sit to stand    Relieving Factors Slow movements    Progression of Symptoms No change since onset    History of similar episodes reports episode of vertigo      Oculomotor Exam   Oculomotor Alignment Normal    Spontaneous Absent      Positional Testing   Dix-Hallpike Dix-Hallpike Right;Dix-Hallpike Left    Sidelying Test Sidelying Right;Sidelying Left      Dix-Hallpike Right   Dix-Hallpike Right Duration approx. 15 secs    Dix-Hallpike Right Symptoms Upbeat, right rotatory nystagmus      Sidelying Right   Sidelying Right Duration pt reported dizziness but no nystagmus noted    Sidelying Right Symptoms No nystagmus      Sidelying Left   Sidelying Left Duration none    Sidelying Left Symptoms No nystagmus              Objective measurements completed on examination: See above findings.               PT Education - 12/04/20 1304    Education Details eval results - pt has Rt BPPV posterior canalithiasis; gave handout from [place] on BPPV    Person(s) Educated Patient    Methods Explanation;Handout    Comprehension Verbalized understanding            PT Short Term Goals - 12/04/20 1315      PT SHORT TERM GOAL #1   Title same as LTG's             PT Long Term Goals - 12/04/20 1315      PT LONG TERM GOAL #1   Title Pt will have a (-) Rt Dix-Hallpike test with no nystagmus and no c/o vertigo to indicate resolution of Rt BPPV.    Time 4    Period Weeks    Status New    Target Date 01/03/21      PT LONG  TERM GOAL #2   Title Pt will  verbalize understanding of techniques to stabilize BP with transitional movements including sit to stand and supine to sit.    Time 4    Period Weeks    Status New    Target Date 01/03/21      PT LONG TERM GOAL #3   Title Increase FOTO score from 45/100 to >/= 55/100 and increase DPS score from 55/100 to >/= 60/100 to indicate improvement in dizziness.    Time 4    Period Weeks    Status New    Target Date 01/03/21      PT LONG TERM GOAL #4   Title Independent in HEP for habituation and self treatment of BPPV as needed if pt has re-occurrence of BPPV in future.    Time 4    Period Weeks    Status New    Target Date 01/03/21                  Plan - 12/04/20 1308    Clinical Impression Statement Pt has a (+) Rt Dix-Hallpike test with Rt rotary upbeating nystagmus in test position - indicative of Rt BPPV posterior canalithiasis.  Pt also c/o light-headedness with supine to sit and with sit to stand transfers.  Pt will benefit from skilled PT to address dizziness (canalith repositioning to be administered) and gait and balance deficits.    Personal Factors and Comorbidities Comorbidity 2;Past/Current Experience;Time since onset of injury/illness/exacerbation    Comorbidities h/o uterine cancer with surgery & radiation, osteoporosis, orthostatic hypotension, HTN, Graves disease, low back pain    Examination-Activity Limitations Bed Mobility;Stand;Transfers;Reach Overhead;Locomotion Level;Squat;Bend    Examination-Participation Restrictions Cleaning;Community Activity;Laundry;Shop;Meal Prep    Stability/Clinical Decision Making Evolving/Moderate complexity    Clinical Decision Making Moderate    Rehab Potential Good    PT Frequency 1x / week    PT Duration 4 weeks    PT Treatment/Interventions ADLs/Self Care Home Management;Canalith Repostioning;Balance training;Neuromuscular re-education;Patient/family education;Vestibular;Gait training;Therapeutic  activities;Therapeutic exercise    PT Next Visit Plan Epley for Rt BPPV    Consulted and Agree with Plan of Care Patient           Patient will benefit from skilled therapeutic intervention in order to improve the following deficits and impairments:  Dizziness,Decreased balance,Difficulty walking  Visit Diagnosis: BPPV (benign paroxysmal positional vertigo), right - Plan: PT plan of care cert/re-cert  Dizziness and giddiness - Plan: PT plan of care cert/re-cert  Unsteadiness on feet - Plan: PT plan of care cert/re-cert     Problem List Patient Active Problem List   Diagnosis Date Noted  . Orthostatic dizziness 11/14/2020  . Educated about COVID-19 virus infection 12/21/2019  . Cognitive change 09/06/2019  . 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 Preop cardiovascular exam 12/20/2018  . 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 Uterine cancer (HCC) 03/28/2018  . Arthralgia of hip or thigh 03/28/2018  . Benign essential HTN 03/28/2018  . Overweight 03/28/2018  . Heart disease 11/09/2014  . Leukocytosis 10/31/2014  . Diastolic congestive heart failure (HCC) 10/30/2014  . Hypothyroidism 10/30/2014  . Chest pain at rest 10/30/2014  . Chest pain 10/30/2014  . Endometrial cancer (HCC) 01/30/2014  . Dyspnea 01/11/2014  . Edema 01/11/2014  . Low back pain with right-sided sciatica 12/13/2013  . Degeneration of lumbar or lumbosacral intervertebral disc 12/13/2013  . Sciatica 12/13/2013    DildayDonavan Burnet, PT 12/04/2020, 1:25 PM  [facility] [address] [place], [place], [address] Phone: [phone]   Fax:  [phone]  Name: Terri Ortega MRN: [id number] Date of Birth: October 11, 1940

## 2020-12-10 ENCOUNTER — Ambulatory Visit: Payer: Medicare PPO | Admitting: Physical Therapy

## 2020-12-10 ENCOUNTER — Other Ambulatory Visit: Payer: Self-pay

## 2020-12-10 DIAGNOSIS — R42 Dizziness and giddiness: Secondary | ICD-10-CM | POA: Diagnosis not present

## 2020-12-10 DIAGNOSIS — R2681 Unsteadiness on feet: Secondary | ICD-10-CM | POA: Diagnosis not present

## 2020-12-10 DIAGNOSIS — H8111 Benign paroxysmal vertigo, right ear: Secondary | ICD-10-CM

## 2020-12-10 NOTE — Patient Instructions (Signed)
Sit to Side-Lying    Sit on edge of bed. 1. Turn head 45 to right. 2. Maintain head position and lie down slowly on left side. Hold until symptoms subside. 3. Sit up slowly. Hold until symptoms subside. 4. Turn head 45 to left. 5. Maintain head position and lie down slowly on right side. Hold until symptoms subside. 6. Sit up slowly. Repeat sequence _5___ times per session. Do _3-5___ sessions per day.    How to Perform the Epley Maneuver The Epley maneuver is an exercise that relieves symptoms of vertigo. Vertigo is the feeling that you or your surroundings are moving when they are not. When you feel vertigo, you may feel like the room is spinning and may have trouble walking. The Epley maneuver is used for a type of vertigo caused by a calcium deposit in a part of the inner ear. The maneuver involves changing head positions to help the deposit move out of the area. You can do this maneuver at home whenever you have symptoms of vertigo. You can repeat it in 24 hours if your vertigo has not gone away. Even though the Epley maneuver may relieve your vertigo for a few weeks, it is possible that your symptoms will return. This maneuver relieves vertigo, but it does not relieve dizziness. What are the risks? If it is done correctly, the Epley maneuver is considered safe. Sometimes it can lead to dizziness or nausea that goes away after a short time. If you develop other symptoms--such as changes in vision, weakness, or numbness--stop doing the maneuver and call your health care provider. Supplies needed:  A bed or table.  A pillow. How to do the Epley maneuver 1. Sit on the edge of a bed or table with your back straight and your legs extended or hanging over the edge of the bed or table. 2. Turn your head halfway toward the affected ear or side as told by your health care provider. 3. Lie backward quickly with your head turned until you are lying flat on your back. You may want to position a  pillow under your shoulders. 4. Hold this position for at least 30 seconds. If you feel dizzy or have symptoms of vertigo, continue to hold the position until the symptoms stop. 5. Turn your head to the opposite direction until your unaffected ear is facing the floor. 6. Hold this position for at least 30 seconds. If you feel dizzy or have symptoms of vertigo, continue to hold the position until the symptoms stop. 7. Turn your whole body to the same side as your head so that you are positioned on your side. Your head will now be nearly facedown. Hold for at least 30 seconds. If you feel dizzy or have symptoms of vertigo, continue to hold the position until the symptoms stop. 8. Sit back up. You can repeat the maneuver in 24 hours if your vertigo does not go away.      Follow these instructions at home: For 24 hours after doing the Epley maneuver:  Keep your head in an upright position.  When lying down to sleep or rest, keep your head raised (elevated) with two or more pillows.  Avoid excessive neck movements. Activity  Do not drive or use machinery if you feel dizzy.  After doing the Epley maneuver, return to your normal activities as told by your health care provider. Ask your health care provider what activities are safe for you. General instructions  Drink enough fluid to  keep your urine pale yellow.  Do not drink alcohol.  Take over-the-counter and prescription medicines only as told by your health care provider.  Keep all follow-up visits as told by your health care provider. This is important. Preventing vertigo symptoms Ask your health care provider if there is anything you should do at home to prevent vertigo. He or she may recommend that you:  Keep your head elevated with two or more pillows while you sleep.  Do not sleep on the side of your affected ear.  Get up slowly from bed.  Avoid sudden movements during the day.  Avoid extreme head positions or movement,  such as looking up or bending over. Contact a health care provider if:  Your vertigo gets worse.  You have other symptoms, including: ? Nausea. ? Vomiting. ? Headache. Get help right away if you:  Have vision changes.  Have a headache or neck pain that is severe or getting worse.  Cannot stop vomiting.  Have new numbness or weakness in any part of your body. Summary  Vertigo is the feeling that you or your surroundings are moving when they are not.  The Epley maneuver is an exercise that relieves symptoms of vertigo.  If the Epley maneuver is done correctly, it is considered safe and relieves vertigo quickly. This information is not intended to replace advice given to you by your health care provider. Make sure you discuss any questions you have with your health care provider. Document Revised: 05/17/2019 Document Reviewed: 05/17/2019 Elsevier Patient Education  [place] for Right Posterior / Anterior Canalithiasis    Sitting on bed: 1. Turn head 45 right. (a) Lie back slowly, shoulders on pillow, head on bed. (b) Hold _20__ seconds. 2. Keeping head on bed, turn head 90 left. Hold _20___ seconds. 3. Roll to left, head on 45 angle down toward bed. Hold _20___ seconds. 4. Sit up on left side of bed. Repeat _3__ times per session. Do _2___ sessions per day.

## 2020-12-11 NOTE — Therapy (Signed)
[facility] [address] [place], [place], [address] Phone: [phone]   Fax:  [phone]  Physical Therapy Treatment  Patient Details  Name: Terri Ortega MRN: [id number] Date of Birth: March 20, 1941 Referring Provider (PT): Dr. Delfin Edis   Encounter Date: 12/10/2020   PT End of Session - 12/11/20 1632    Visit Number 2    Number of Visits 5    Date for PT Re-Evaluation 01/03/21    Authorization Type [employer]    Authorization Time Period 12-03-20 - 02-02-21    PT Start Time 0850    PT Stop Time 0930    PT Time Calculation (min) 40 min    Activity Tolerance Patient tolerated treatment well    Behavior During Therapy [facility] for tasks assessed/performed           Past Medical History:  Diagnosis Date  . Graves disease   . HTN (hypertension)   . Low back pain    Dr. Ronnald Ramp  . Osteoporosis   . Pneumonia 2012  . Radiation 03/20/14, 03/27/14, 04/05/14, 04/12/14, 04/17/14   bracytherapy to proximal vagina 30 gray  . Scoliosis   . Uterine cancer ([place])    surgery and radiation x 5 treatments-last 9'15  . Vitamin D deficiency     Past Surgical History:  Procedure Laterality Date  . BUNIONECTOMY     left  . CATARACT EXTRACTION, BILATERAL Bilateral    last done 11-21-14  . COLONOSCOPY WITH PROPOFOL N/A 12/25/2014   Procedure: COLONOSCOPY WITH PROPOFOL;  Surgeon: Garlan Fair, MD;  Location: [facility];  Service: Endoscopy;  Laterality: N/A;  . FOOT SURGERY Left 2004   Dr Johnnye Sima  . JOINT REPLACEMENT     RTKA  . ROBOTIC ASSISTED TOTAL HYSTERECTOMY WITH BILATERAL SALPINGO OOPHERECTOMY  01/30/14   with lymph node biopsy  . TOTAL KNEE ARTHROPLASTY     right  . TOTAL KNEE REVISION Right 01/04/2019   Procedure: TOTAL KNEE REVISION;  Surgeon: Gaynelle Arabian, MD;  Location: [facility];  Service: Orthopedics;  Laterality: Right;  119min with block    There were no vitals filed for this visit.   Subjective  Assessment - 12/11/20 1629    Subjective Pt reports the dizziness is much improved - has not had much at all this week    Pertinent History orthostatic hypotension, h/o uterine cancer with surgery & radiation, Graves disease, HTN, osteoporosis, failed Rt TKA    Patient Stated Goals "Be able to walk comfortably"; states children gave her a recumbent bike for Christmas and she uses it twice a day    Currently in Pain? No/denies                       Rt Dix-Hallpike test (-) with no nystagmus noted in test position and no c/o dizziness in test position       Vestibular Treatment/Exercise - 12/11/20 0001      Vestibular Treatment/Exercise   Vestibular Treatment Provided Canalith Repositioning    Canalith Repositioning Epley Manuever Right    Habituation Exercises Nestor Lewandowsky       EPLEY MANUEVER RIGHT   Number of Reps  2    Overall Response Symptoms Resolved    Response Details  no nystagmus noted - performed 2 reps to confirm that Rt BPPV had resolved ; more cervical extension obtained on 2nd rep than on 1st rep      Nestor Lewandowsky   Number  of Reps  1    Symptom Description  no symptoms experienced; these exercises were demonstrated for HEP prn should BPPV re-occur in future                 PT Education - 12/11/20 1632    Education Details Laruth Bouchard- Daroff exercises and also picture of Epley given for self treatment prn    Person(s) Educated Patient    Methods Explanation;Demonstration;Handout    Comprehension Verbalized understanding;Returned demonstration            PT Short Term Goals - 12/04/20 1315      PT SHORT TERM GOAL #1   Title same as LTG's             PT Long Term Goals - 12/11/20 1637      PT LONG TERM GOAL #1   Title Pt will have a (-) Rt Dix-Hallpike test with no nystagmus and no c/o vertigo to indicate resolution of Rt BPPV.    Time 4    Period Weeks    Status New      PT LONG TERM GOAL #2   Title Pt will verbalize  understanding of techniques to stabilize BP with transitional movements including sit to stand and supine to sit.    Time 4    Period Weeks    Status New      PT LONG TERM GOAL #3   Title Increase FOTO score from 45/100 to >/= 55/100 and increase DPS score from 55/100 to >/= 60/100 to indicate improvement in dizziness.    Time 4    Period Weeks    Status New      PT LONG TERM GOAL #4   Title Independent in HEP for habituation and self treatment of BPPV as needed if pt has re-occurrence of BPPV in future.    Time 4    Period Weeks    Status New                 Plan - 12/11/20 1633    Clinical Impression Statement No nystagmus and no c/o vertigo reported on 2nd rep of Epley - no nystagmus was noted on 1st rep either; 2nd rep was performed for confirmation of findings for resolution of Rt BPPV.  Pt performed Brandt-Daroff exercise for HEP instruction for self treatment prn - no nystagmus and no c/o vertigo with any movements, indicative that Rt BPPV has resolved as of current time.  Pt requests follow up visit in 3 weeks to have if needed.    Personal Factors and Comorbidities Comorbidity 2;Past/Current Experience;Time since onset of injury/illness/exacerbation    Comorbidities h/o uterine cancer with surgery & radiation, osteoporosis, orthostatic hypotension, HTN, Graves disease, low back pain    Examination-Activity Limitations Bed Mobility;Stand;Transfers;Reach Overhead;Locomotion Level;Squat;Bend    Examination-Participation Restrictions Cleaning;Community Activity;Laundry;Shop;Meal Prep    Stability/Clinical Decision Making Evolving/Moderate complexity    Rehab Potential Good    PT Frequency 1x / week    PT Duration 4 weeks    PT Treatment/Interventions ADLs/Self Care Home Management;Canalith Repostioning;Balance training;Neuromuscular re-education;Patient/family education;Vestibular;Gait training;Therapeutic activities;Therapeutic exercise    PT Next Visit Plan recheck Rt BPPV     Consulted and Agree with Plan of Care Patient           Patient will benefit from skilled therapeutic intervention in order to improve the following deficits and impairments:  Dizziness,Decreased balance,Difficulty walking  Visit Diagnosis: BPPV (benign paroxysmal positional vertigo), right     Problem List Patient  Active Problem List   Diagnosis Date Noted  . Orthostatic dizziness 11/14/2020  . Educated about COVID-19 virus infection 12/21/2019  . Cognitive change 09/06/2019  . 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[place]) 01/04/2019  . 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 ([place]) 01/04/2019  . Preop cardiovascular exam 12/20/2018  . 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 Uterine cancer ([place]) 03/28/2018  . Arthralgia of hip or thigh 03/28/2018  . Benign essential HTN 03/28/2018  . Overweight 03/28/2018  . Heart disease 11/09/2014  . Leukocytosis 10/31/2014  . Diastolic congestive heart failure ([place]) 10/30/2014  . Hypothyroidism 10/30/2014  . Chest pain at rest 10/30/2014  . Chest pain 10/30/2014  . Endometrial cancer ([place]) 01/30/2014  . Dyspnea 01/11/2014  . Edema 01/11/2014  . Low back pain with right-sided sciatica 12/13/2013  . Degeneration of lumbar or lumbosacral intervertebral disc 12/13/2013  . Sciatica 12/13/2013    Alda Lea, PT 12/11/2020, 4:38 PM  [facility] [address] [place], [place], [address] Phone: [phone]   Fax:  [phone]  Name: Terri Ortega MRN: [id number] Date of Birth: 1941/05/23

## 2020-12-14 DIAGNOSIS — I420 Dilated cardiomyopathy: Secondary | ICD-10-CM | POA: Insufficient documentation

## 2020-12-14 DIAGNOSIS — I351 Nonrheumatic aortic (valve) insufficiency: Secondary | ICD-10-CM | POA: Insufficient documentation

## 2020-12-14 NOTE — Progress Notes (Signed)
Cardiology Office Note   Date:  12/16/2020   ID:  Terri Ortega, DOB [birth year], MRN [id number]  PCP:  [name], MD  Cardiologist:   Minus Breeding, MD   Chief Complaint  Patient presents with  . Dizziness      History of Present Illness: Terri Ortega is a 80 y.o. female who presents for follow up of a mildly reduced ejection fraction.  This was noted 7 years ago.  Her EF was found to be 45% on an echocardiogram.  However, subsequent follow-up demonstrated to be normalized to 65%.  The patient presents and has complaints predominantly with dizziness.  This has been going on since the beginning of the year.  She just feels uncomfortable with her feet.  She feels unsteady.  She feels like things are spinning a little bit.  She might have been referred to physical therapy for possible maneuver to treat an inner ear issue but I am not clear on these details.  She has seen neurology.  I reviewed his CT and she has some small vessel disease.  She is going to be considered for a PET scan.  Has not been a clear etiology.  She was slightly orthostatic but overall hypertensive.  She was taken off of Lasix by her primary provider and did not have a significant improvement in her symptoms.  She does clearly have an orthostatic component to the symptoms.  She is not really having any palpitations.  She is not having any chest pressure, neck or arm discomfort.  She is not having any weight gain or edema.  Her son-in-law is Dr. Harrington Challenger.   Past Medical History:  Diagnosis Date  . Graves disease   . HTN (hypertension)   . Low back pain    Dr. Ronnald Ramp  . Osteoporosis   . Pneumonia 2012  . Radiation 03/20/14, 03/27/14, 04/05/14, 04/12/14, 04/17/14   bracytherapy to proximal vagina 30 gray  . Scoliosis   . Uterine cancer ([place])    surgery and radiation x 5 treatments-last 9'15  . Vitamin D deficiency     Past Surgical History:  Procedure Laterality Date  . BUNIONECTOMY     left   . CATARACT EXTRACTION, BILATERAL Bilateral    last done 11-21-14  . COLONOSCOPY WITH PROPOFOL N/A 12/25/2014   Procedure: COLONOSCOPY WITH PROPOFOL;  Surgeon: Garlan Fair, MD;  [facility];  Service: Endoscopy;  Laterality: N/A;  . FOOT SURGERY Left 2004   Dr Johnnye Sima  . JOINT REPLACEMENT     RTKA  . ROBOTIC ASSISTED TOTAL HYSTERECTOMY WITH BILATERAL SALPINGO OOPHERECTOMY  01/30/14   with lymph node biopsy  . TOTAL KNEE ARTHROPLASTY     right  . TOTAL KNEE REVISION Right 01/04/2019   Procedure: TOTAL KNEE REVISION;  Surgeon: Gaynelle Arabian, MD;  Location: [facility];  Service: Orthopedics;  Laterality: Right;  178min with block     Current Outpatient Medications  Medication Sig Dispense Refill  . amLODipine (NORVASC) 2.5 MG tablet Take 1 tablet (2.5 mg total) by mouth daily. 180 tablet 3  . amoxicillin (AMOXIL) 500 MG capsule Take 2,000 mg by mouth See admin instructions. Take 2000 mg 1 hour prior to dental work    . Cholecalciferol (VITAMIN D3 PO) Take 2,000 Int'l Units by mouth daily.    Marland Kitchen donepezil (ARICEPT) 5 MG tablet Take 1 tablet (5 mg total) by mouth at bedtime. 30 tablet 11  . levothyroxine (SYNTHROID) 88 MCG tablet Take 88 mcg  by mouth daily before breakfast.     . Multiple Vitamins-Minerals (CENTRUM SILVER PO) Take by mouth.    . Naproxen Sodium (ALEVE PO) Take 220 mg by mouth as needed.    Marland Kitchen OVER THE COUNTER MEDICATION Tums     No current facility-administered medications for this visit.    Allergies:   Percocet [oxycodone-acetaminophen]    ROS:  Please see the history of present illness.   Otherwise, review of systems are positive for diffuse bony aches with tenderness to palpation.   All other systems are reviewed and negative.    PHYSICAL EXAM: VS:  BP (!) 158/90   Pulse (!) 57   Ht 5\' 4"  (1.626 m)   Wt 184 lb 11.2 oz (83.8 kg)   SpO2 96%   BMI 31.70 kg/m  , BMI Body mass index is 31.7 kg/m. GENERAL:  Well appearing NECK:  No jugular venous  distention, waveform within normal limits, carotid upstroke brisk and symmetric, no bruits, no thyromegaly LUNGS:  Clear to auscultation bilaterally CHEST:  Unremarkable HEART:  PMI not displaced or sustained,S1 and S2 within normal limits, no S3, no S4, no clicks, no rubs, no murmurs ABD:  Flat, positive bowel sounds normal in frequency in pitch, no bruits, no rebound, no guarding, no midline pulsatile mass, no hepatomegaly, no splenomegaly EXT:  2 plus pulses throughout, no edema, no cyanosis no clubbing   EKG:  EKG is  ordered today. The ekg ordered today demonstrates sinus rhythm, rate 57, left axis deviation, left ventricular hypertrophy by voltage criteria, lateral T wave inversions all unchanged from previous.   Recent Labs: No results found for requested labs within last 8760 hours.    Lipid Panel    Component Value Date/Time   CHOL 147 10/31/2014 0241   TRIG 38 10/31/2014 0241   HDL 52 10/31/2014 0241   CHOLHDL 2.8 10/31/2014 0241   VLDL 8 10/31/2014 0241   LDLCALC 87 10/31/2014 0241      Wt Readings from Last 3 Encounters:  12/16/20 184 lb 11.2 oz (83.8 kg)  11/13/20 185 lb (83.9 kg)  12/25/19 180 lb (81.6 kg)      Other studies Reviewed: Additional studies/ records that were reviewed today include: Labs, neurology notes Review of the above records demonstrates:  Please see elsewhere in the note.     ASSESSMENT AND PLAN:   CARDIOMYOPATHY:  I would not suspect this to be reduced by exam.  I will have a low threshold for further evaluation if she continues to have multiple somatic complaints including the dizziness and there is no other clear etiology.  HTN: The blood pressure is  not controlled.  I had a long discussion with the patient and her daughter.  Certainly there is some orthostatic symptoms.  However, overall her blood pressure is not well controlled which is going to progress any central neurologic vascular issues.  I think she needs blood pressure  control.  I do not think that adding amlodipine 2.5 mg daily would exacerbate her dizziness as she is dizzy already.  They are going to keep a blood pressure diary.  AORTIC VALVE DISEASE:  I do not suspect progressive structural heart disease but again we will have a low threshold for echo in the future.  ANEMIA: Hemoglobin was normal earlier this year.  No change in therapy.   BRADYCARDIA: Given this and the dizziness I am going to check a 3-day Zio patch.  ABNORMAL EKG:    She has had an  abnormal EKG previously had a negative ischemia work-up in the past.  She has no active symptoms.  No change in therapy.  Current medicines are reviewed at length with the patient today.  The patient does not have concerns regarding medicines.  The following changes have been made:    Labs/ tests ordered today include: None  Orders Placed This Encounter  Procedures  . LONG TERM MONITOR (3-14 DAYS)  . EKG 12-Lead     Disposition:   FU with me in six months.   Signed, Minus Breeding, MD  12/16/2020 1:20 PM    [facility]

## 2020-12-16 ENCOUNTER — Other Ambulatory Visit: Payer: Self-pay

## 2020-12-16 ENCOUNTER — Ambulatory Visit (INDEPENDENT_AMBULATORY_CARE_PROVIDER_SITE_OTHER): Payer: Medicare PPO

## 2020-12-16 ENCOUNTER — Ambulatory Visit: Payer: Medicare PPO | Admitting: Cardiology

## 2020-12-16 ENCOUNTER — Encounter: Payer: Self-pay | Admitting: Cardiology

## 2020-12-16 VITALS — BP 158/90 | HR 57 | Ht 64.0 in | Wt 184.7 lb

## 2020-12-16 DIAGNOSIS — I10 Essential (primary) hypertension: Secondary | ICD-10-CM

## 2020-12-16 DIAGNOSIS — I351 Nonrheumatic aortic (valve) insufficiency: Secondary | ICD-10-CM

## 2020-12-16 DIAGNOSIS — R42 Dizziness and giddiness: Secondary | ICD-10-CM

## 2020-12-16 DIAGNOSIS — I420 Dilated cardiomyopathy: Secondary | ICD-10-CM | POA: Diagnosis not present

## 2020-12-16 DIAGNOSIS — R072 Precordial pain: Secondary | ICD-10-CM

## 2020-12-16 MED ORDER — AMLODIPINE BESYLATE 2.5 MG PO TABS: 2.5000 mg | ORAL_TABLET | Freq: Every day | ORAL | 3 refills | Status: DC

## 2020-12-16 NOTE — Progress Notes (Unsigned)
Patient enrolled for [employer] to ship a 3 day ZIO XT monitor to her home. 

## 2020-12-16 NOTE — Patient Instructions (Addendum)
Medication Instructions:  START AMLODIPINE 2.5 MG DAILY   *If you need a refill on your cardiac medications before your next appointment, please call your pharmacy*  Lab Work: NONE  Testing/Procedures: 3 DAY ZIO PATCH   Follow-Up: At [facility], you and your health needs are our priority.  As part of our continuing mission to provide you with exceptional heart care, we have created designated Provider Care Teams.  These Care Teams include your primary Cardiologist (physician) and Advanced Practice Providers (APPs -  Physician Assistants and Nurse Practitioners) who all work together to provide you with the care you need, when you need it.  We recommend signing up for the patient portal called "MyChart".  Sign up information is provided on this After Visit Summary.  MyChart is used to connect with patients for Virtual Visits (Telemedicine).  Patients are able to view lab/test results, encounter notes, upcoming appointments, etc.  Non-urgent messages can be sent to your provider as well.   To learn more about what you can do with MyChart, go to [email or web address].    Your next appointment:   6 MONTHS   The format for your next appointment:   In Person  Provider:   You may see Minus Breeding, MD or one of the following Advanced Practice Providers on your designated Care Team:    Rosaria Ferries, PA-C  Jory Sims, DNP, ANP  Other Instructions  [place] Monitor Instructions   Your physician has requested you wear a ZIO patch monitor for _3__ days.  This is a single patch monitor.   [employer] supplies one patch monitor per enrollment. Additional stickers are not available. Please do not apply patch if you will be having a Nuclear Stress Test, Echocardiogram, Cardiac CT, MRI, or Chest Xray during the period you would be wearing the monitor. The patch cannot be worn during these tests. You cannot remove and re-apply the ZIO XT patch monitor.  Your ZIO patch monitor  will be sent Fed Ex from [employer] directly to your home address. It may take 3-5 days to receive your monitor after you have been enrolled.  Once you have received your monitor, please review the enclosed instructions. Your monitor has already been registered assigning a specific monitor serial # to you.  Billing and Patient Assistance Program Information   We have supplied [employer] with any of your insurance information on file for billing purposes. [employer] offers a sliding scale Patient Assistance Program for patients that do not have insurance, or whose insurance does not completely cover the cost of the ZIO monitor.   You must apply for the Patient Assistance Program to qualify for this discounted rate.     To apply, please call [employer] at [phone], select option 4, then select option 2, and ask to apply for Patient Assistance Program.  Theodore Demark will ask your household income, and how many people are in your household.  They will quote your out-of-pocket cost based on that information.  [employer] will also be able to set up a 63-month, interest-free payment plan if needed.  Applying the monitor   Shave hair from upper left chest.  Hold abrader disc by orange tab. Rub abrader in 40 strokes over the upper left chest as indicated in your monitor instructions.  Clean area with 4 enclosed alcohol pads. Let dry.  Apply patch as indicated in monitor instructions. Patch will be placed under collarbone on left side of chest with arrow pointing upward.  Rub patch adhesive wings  for 2 minutes. Remove white label marked "1". Remove the white label marked "2". Rub patch adhesive wings for 2 additional minutes.  While looking in a mirror, press and release button in center of patch. A small green light will flash 3-4 times. This will be your only indicator that the monitor has been turned on. ?  Do not shower for the first 24 hours. You may shower after the first 24 hours.  Press the button if you  feel a symptom. You will hear a small click. Record Date, Time and Symptom in the Patient Logbook.  When you are ready to remove the patch, follow instructions on the last 2 pages of the Patient Logbook. Stick patch monitor onto the last page of Patient Logbook.  Place Patient Logbook in the blue and white box.  Use locking tab on box and tape box closed securely.  The blue and white box has prepaid postage on it. Please place it in the mailbox as soon as possible. Your physician should have your test results approximately 7 days after the monitor has been mailed back to [facility].  Call [facility] at [phone] if you have questions regarding your ZIO XT patch monitor. Call them immediately if you see an orange light blinking on your monitor.  If your monitor falls off in less than 4 days, contact our Monitor department at [phone]. ?If your monitor becomes loose or falls off after 4 days call [employer] at [phone] for suggestions on securing your monitor.?

## 2020-12-19 ENCOUNTER — Encounter: Payer: Self-pay | Admitting: Psychology

## 2020-12-19 NOTE — Progress Notes (Signed)
Neuropsychological Evaluation   Patient:  Terri Ortega   DOB: [birth year]  MR Number: [id number]  Location: [facility] AND REHABILITATION [place], [id number] [place] [address] Dept: [phone]  Start: 8 AM End: 10 AM  Provider/Observer:     Edgardo Roys PsyD  Chief Complaint:      Chief Complaint  Patient presents with  . Memory Loss    Reason For Service:      Terri Ortega is an 80 year old female that was initially referred by Dr. Jaynee Eagles for neuropsychological evaluation due to reports of increasing issues with memory and forgetfulness.  The patient also continues to have significant pain with her knee that she had surgery on more than a year ago.  The patient has a past medical history of high blood pressure, uterine cancer, osteoporosis, hypothyroidism, memory difficulties diastolic congestive heart failure.  Patient continues to minimize some of her difficulties but admits that she has been experiencing little worsening of her symptoms over the past year.  I initially saw her for a neuropsychological evaluation at the beginning of this year and this is the 55-month follow-up of testing.  During the follow-up clinical interview performed 07/29/2020 the patient and her daughter were present.  The patient reports that she feels that her memory difficulties have been "a little worse" than they were during our initial evaluation and she feels like she is having a little more trouble coming up with people's names.  The patient is continuing to have problems with balance and gait and she reports these balance issues are not solely related to continued pain in her knee after knee replacement surgery.  The patient's daughter reports that there have continue to be some changes but not very dramatic since our last evaluation.  The patient's daughter reports that she has  noted some mild progression and memory difficulties particularly during discussions with family members and having difficulty remembering these discussions later.  The patient is described as remaining in keeping her capacity for remembering others names and still good with geographic orientation.  The patient's daughter reports that the patient has more "curt" in her responding to others.  The patient is described as being abrupt terse in her reactions with others at the time.  While this is a trait for her in the past it has become more dominant and the patient is described as having less of a "filter" and what she is saying.  Since our initial evaluation the family has discussed with other family members issues of possible dementia.  The patient and her daughter have been told that there is some family history of dementia but most were post age 1.  Below I will include the history and reason for service that was initially recorded during the initial neuropsychological evaluation done in February and March of this year.  The patient had a head CT in 2004 after a fall. There was no evidence of fracture or intracranial hemorrhage but mild atrophy was noted but the CT was relatively unremarkable. 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 There are no indications of fall or other gait changes. She is remaining actively socially although COVID-19 and social isolation's have been problematic. Medical records show that there are some concerns for the patient's daughter feel like the patient's logic has been impaired and there are some memory  difficulties. 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 She was ultimately able to get out of the issues with a friend who is sophisticated in Engineer, mining but it did take some doing to avoid significant harm from this  computer Internet scam.  The patient has had a recent knee replacement and has had some changes in gait with increased pain. She reports that it is improving some more recently and she is doing better but she continues to have significant pain. The patient and her husband deny any significant mood changes and no issues of depression or anxiety. There are no indications of any geographic disorientation changes although the patient is described by both herself and her husband has never been very good with direction. The patient reports that she does have some issues with sleep related to snoring heavily but overall she feels like her sleep pattern is adequate. The patient has a good appetite and does try to have good nutrition.  The patient and her husband deny any indications of tremor, visual or auditory hallucination or other behavioral or psychiatric changes. Expressive and receptive language appear to be within normal limits.  Neuropsychology Note Edye Hainline Starry completed another 120 minutes of repeat neuropsychological testing with this provider. She did not appear to have difficulty seeing, hearing, or understanding test items/questions; did not require much additional prompting. She exhibited adequate distress tolerance on questions she did not know or tasks that were more difficult.    Behavioral Observations: Appearance:Casually and appropriately dressed with good hygiene. Gait:Ambulated with cane for assistance. Left sided weakness (e.g., drag left leg) Speech:Clear, normal rate, normal tone & volume. Thought process:Goal directed, Linear, and Logical.  Mood/Affect:Mildlydepressed. Appropriate.  Interpersonal:Polite and appropriate. Orientation: Oriented x 4 Effort/Motivation:Good. Cooperative with all assigned tasks.   Tests Administered:   Controlled Oral Word Association Test (COWAT; FAS & Animals)  Modified [employer] (M-WCST)   Trail  Making Test (TMT)   Wechsler Memory Scale, 4th Edition (WMS-IV); Older Adult Battery  Results:  Below you will see tables of the patient's both current neuropsychological test performances as well as those that were obtained in March 2021..  The most recent measures were obtained September 02, 2020.  They will simply be designated as 2021 and 2022 for convenience:                                                             Raw Score      T-Score           Percentile        Description   2022:  COWAT                         FAS                             13                    27                    1  Exceptionally Low                          Animals                       5                      18                    <1                    Exceptionally Low    M-WCST                                                             Raw Score      T-Score           Percentile        Description              # Categories Correct               4                      42                    21                    Below Average             # Perseverative Errors            18                    30                    2                       Borderline             # Total Errors                          19                    45                    31                    Average                         % Perseverative Errors           92                    28                    1                      Mildly Abnormal  EF Composite                                                 SS=77             6                      Borderline                                                                                                                                                                                      Scaled Score   TMT             Part A                                      80"                   6                      9                      Below Average                                                              Error=0             Part B                                      191"                 7                      16                    Below Average  Error=1 *Based on [employer]'s Older Normative Studies (MOANS)  WAIS-IV   2021:  Composite Score Summary   Scale Sum of Scaled Scores Composite Score Percentile Rank 95% Conf. Interval Qualitative Description  Verbal Comprehension 30 VCI 100 50 94-106 Average  Perceptual Reasoning 22 PRI 84 14 79-91 Low Average  Working Memory 15 WMI 86 18 80-94 Low Average  Processing Speed 13 PSI 81 10 75-91 Low Average  Full Scale 80 FSIQ 86 18 82-90 Low Average  General Ability 52 GAI 92 30 87-97 Average   2022:           Composite Score Summary   Scale Sum of Scaled Scores Composite Score Percentile Rank 95% Conf. Interval Qualitative Description  Verbal Comprehension 32 VCI 103 58 97-109 Average  Perceptual Reasoning 25 PRI 90 25 84-97 Average  Working Memory 15 WMI 86 18 80-94 Low Average  Processing Speed 13 PSI 81 10 75-91 Low Average  Full Scale 85 FSIQ 89 23 85-93 Low Average  General Ability 57 GAI 97 42 92-102 Average    2021:  Verbal Comprehension Subtests Summary   Subtest Raw Score Scaled Score Percentile Rank Reference Group Scaled Score SEM  Similarities 24 11 63 10 1.12  Vocabulary 35 10 50 10 0.73  Information 11 9 37 9 0.73     2022:         Verbal Comprehension Subtests Summary   Subtest Raw Score Scaled Score Percentile Rank Reference Group Scaled Score SEM  Similarities 21 10 50 8 0.90  Vocabulary 43 12 75 12 0.73  Information 12 10 50 9 0.73   2021:  Perceptual Reasoning Subtests Summary   Subtest Raw Score Scaled Score Percentile Rank Reference Group Scaled Score SEM  Block Design 20 8 25 5  1.27  Matrix Reasoning 6 7 16 3  0.73  Visual Puzzles 7 7 16 5  0.99   2022:   Perceptual Reasoning Subtests  Summary   Subtest Raw Score Scaled Score Percentile Rank Reference Group Scaled Score SEM  Block Design 24 10 50 6 1.34  Matrix Reasoning 6 7 16 3  1.12  Visual Puzzles 7 8 25 5  1.27   2021  Working Memory Subtests Summary   Subtest Raw Score Scaled Score Percentile Rank Reference Group Scaled Score SEM  Digit Span 21 9 37 6 0.79  Arithmetic 8 6 9 5  0.95   2022:          Working Print production planner Raw Score Scaled Score Percentile Rank Reference Group Scaled Score SEM  Digit Span 18 8 25 5  0.85  Arithmetic 8 7 16 5  0.99    2021:  Processing Speed Subtests Summary   Subtest Raw Score Scaled Score Percentile Rank Reference Group Scaled Score SEM  Symbol Search 15 8 25 4  1.12  Coding 21 5 5 1  1.12    2022:         Processing Speed Subtests Summary   Subtest Raw Score Scaled Score Percentile Rank Reference Group Scaled Score SEM  Symbol Search 11 7 16 2  1.12  Coding 21 6 9 1  1.12   WMS-IV Older Adult Battery  2021:  Index Score Summary   Index Sum of Scaled Scores Index Score Percentile Rank 95% Confidence Interval Qualitative Descriptor  Auditory Memory (AMI) 43 104 61 98-110 Average  Visual Memory (VMI) 13 82 12 78-87 Low Average  Immediate Memory (IMI) 27 93 32 87-100 Average  Delayed Memory (DMI) 29 98 45 91-106 Average  2022:         Index Score Summary   Index Sum of Scaled Scores Index Score Percentile Rank 95% Confidence Interval Qualitative Descriptor  Auditory Memory (AMI) 58 128 97 120-133 Superior  Visual Memory (VMI) 20 100 50 95-105 Average  Immediate Memory (IMI) 37 115 84 108-120 High Average  Delayed Memory (DMI) 41 125 95 115-131 Superior   2021:  Predicted Difference Method   Index Predicted WMS-IV Index Score Actual WMS-IV Index Score Difference Critical Value  Significant Difference Y/N Base Rate  Auditory Memory 100 104 -4 9.39 N   Visual Memory 100 82 18 8.28 Y 10%  Immediate Memory 100 93 7 10.49 N    Delayed Memory 100 98 2 12.08 N   Statistical significance (critical value) at the .01 level.    2022:            Predicted Difference Method   Index Predicted WMS-IV Index Score Actual WMS-IV Index Score Difference Critical Value  Significant Difference Y/N Base Rate  Auditory Memory 98 128 -30 9.33 Y 1%  Visual Memory 98 100 -2 7.72 N   Immediate Memory 98 115 -17 10.41 Y 5-10%  Delayed Memory 98 125 -27 10.86 Y 1-2%  Statistical significance (critical value) at the .01 level.     Summary of Results:   Overall, the results of the current neuropsychological evaluation including specific comparisons to the initial neuropsychological evaluation conducted in the spring 2022 are quite encouraging.  In many aspects of cognitive functioning in particular both visual and auditory memory and learning the patient has shown significant improvements from the initial testing.  While not assessed on the initial evaluation the patient's current assessment does show some mild to moderate deficits with regard to word finding abilities and verbal fluency which are consistent with subjective reports by both the patient and her daughter.  The patient is also continuing to show significant visual reasoning and problem-solving deficits and significant reduction in information processing speed and difficulties with cognitive shifting.  Globally, the patient is shown some significant improvement since initial assessment but she does continue to have the same deficits with regard to visual reasoning and problem-solving but has shown improvements in visual analysis and organization and visual-spatial/visual constructional abilities.  The patient shows steady patterns of some mild issues with regard to auditory encoding patterns.  Also continued on changes at decreased in her information processing speed from the initial evaluation.  There were profound and significant improvements in the patient's overall  current memory functions versus the initial assessment and while the patient and her daughter report reductions in memory functions over the past roughly 1 year the patient has in fact improved significantly on objective testing.  On the most recent evaluation there were no deficits found in new learning but there continues to be a pattern of significant differences between auditory versus visual memory with superior performance on auditory memory functions whereas the initial evaluation she was in the average range and now average performance on visual memory functions whereas prior she showed significant impairments for visual memory.  So there do continue to be significant splits between visual versus auditory with auditory greater than visual but it currently there are no deficits and in fact superior performance for auditory memory and learning.  There were also no significant deficits with regard to either immediate or delayed memory functions.  Impression/Diagnosis:   The results of the current neuropsychological evaluation, with direct comparisons to previous neuropsychological testing and  combining the patient, her husband and her daughter subjective symptom reports along with neurological and brain imaging results in conjunction with objective cognitive performances do suggest that the patient that the patient is continuing to have objective measures of cognitive difficulties but for the most part they have either stayed the same or actually improved in some areas significantly since previous testing.  The patient and family report issues with verbal expression including word finding and verbal fluency issues and these are objectively found but have remained generally stable.  The patient continues to show primary deficits that have not deteriorated with regard to visual reasoning and problem-solving, reduced information processing speed and difficulties with cognitive shifting and mild auditory encoding  deficits.  However, her visual memory, which was found to be significantly impaired initially is now in the average range and she has made significant improvements with regard to auditory memory and learning.  There were no areas of cognitive functioning that showed significant decline or any decline at all but only stability or improvement over the past 9 to 10 months.  As far as diagnostic considerations this pattern continues to not be consistent with any typical pattern of degenerative or progressive dementia either cortical or subcortical.  There continue to be some patterns of lateralization that continue to be identified with left hemisphere performing more effectively and efficiently than right hemisphere and some of these may be features of longstanding cognitive strengths with regard to auditory memory and learning and auditory performance.  Again, there are no indications of any progression with regard to her deficits and they have remained either stable or showing significant improvements particularly in the domain of memory and learning.  As far as recommendations going forward I do think that we should address ongoing difficulties with restless sleep patterns and the patient is also quite concerned and hypervigilant of her any difficulties which may also be exacerbating some of her symptoms.  She may benefit from the initiation of an SSRI medication.  The patient has been taking Aricept more recently but I am not sure whether this will provide any significant improvement as currently objective assessments of memory and learning are quite good and encouraging with significant improvements recently prior to the addition of Aricept.  Diagnosis:    Subjective memory complaints  Cognitive change that continues to be stable in areas of deficits with significant improvements in auditory memory and learning as well as visual memory and learning.   _____________________ Ilean Skill,  Psy.D. Clinical Neuropsychologist

## 2020-12-19 NOTE — Progress Notes (Signed)
11/04/2020 2 PM-3 PM: Today's visit was an in person visit that was conducted in my outpatient clinic office with the patient and her husband present.  We reviewed the results of the recent neuropsychological evaluation which were quite encouraging and showed no indication of any progressive type of dementia process and is clearly not consistent with a progressive dementia such as Alzheimer's or Lewy body type of condition.  The patient does continue to show some mild difficulties to moderate difficulties with regard to visual processing type issues with regard to visual reasoning and problem-solving slowed information processing speed and mild difficulties with cognitive shifting.  The patient is also showing signs and symptoms that are consistent with her subjective reports of word finding difficulties and some verbal fluency changes however, overall the patient is shown some significant improvements since the initial assessment on measures of learning and memory.  Her auditory memory is now functioned in the superior range and while visual memory shows relative weaknesses it is also currently in the average range with significant improvements in both of these aspects and there were no deficits found with regard to immediate or delayed memory and learning.  I have provided feedback regarding these results as well as recommendations.  At this point, we may not need to do any further testing unless there are specific changes.  I do think that the patient has been increasingly hypervigilant over any changes and concerns and worries that she may have cognitive changes beyond those of normal age-related changes.  I will include a copy of the summary and impressions from the formal neuropsychological evaluation below for convenience and the complete neuropsychological evaluation can be found in the patient's EMR dated 11/04/2020.    Summary of Results:   Overall, the results of the current neuropsychological evaluation  including specific comparisons to the initial neuropsychological evaluation conducted in the spring 2022 are quite encouraging.  In many aspects of cognitive functioning in particular both visual and auditory memory and learning the patient has shown significant improvements from the initial testing.  While not assessed on the initial evaluation the patient's current assessment does show some mild to moderate deficits with regard to word finding abilities and verbal fluency which are consistent with subjective reports by both the patient and her daughter.  The patient is also continuing to show significant visual reasoning and problem-solving deficits and significant reduction in information processing speed and difficulties with cognitive shifting.  Globally, the patient is shown some significant improvement since initial assessment but she does continue to have the same deficits with regard to visual reasoning and problem-solving but has shown improvements in visual analysis and organization and visual-spatial/visual constructional abilities.  The patient shows steady patterns of some mild issues with regard to auditory encoding patterns.  Also continued on changes at decreased in her information processing speed from the initial evaluation.  There were profound and significant improvements in the patient's overall current memory functions versus the initial assessment and while the patient and her daughter report reductions in memory functions over the past roughly 1 year the patient has in fact improved significantly on objective testing.  On the most recent evaluation there were no deficits found in new learning but there continues to be a pattern of significant differences between auditory versus visual memory with superior performance on auditory memory functions whereas the initial evaluation she was in the average range and now average performance on visual memory functions whereas prior she showed  significant impairments for visual  memory.  So there do continue to be significant splits between visual versus auditory with auditory greater than visual but it currently there are no deficits and in fact superior performance for auditory memory and learning.  There were also no significant deficits with regard to either immediate or delayed memory functions.  Impression/Diagnosis:   The results of the current neuropsychological evaluation, with direct comparisons to previous neuropsychological testing and combining the patient, her husband and her daughter subjective symptom reports along with neurological and brain imaging results in conjunction with objective cognitive performances do suggest that the patient that the patient is continuing to have objective measures of cognitive difficulties but for the most part they have either stayed the same or actually improved in some areas significantly since previous testing.  The patient and family report issues with verbal expression including word finding and verbal fluency issues and these are objectively found but have remained generally stable.  The patient continues to show primary deficits that have not deteriorated with regard to visual reasoning and problem-solving, reduced information processing speed and difficulties with cognitive shifting and mild auditory encoding deficits.  However, her visual memory, which was found to be significantly impaired initially is now in the average range and she has made significant improvements with regard to auditory memory and learning.  There were no areas of cognitive functioning that showed significant decline or any decline at all but only stability or improvement over the past 9 to 10 months.  As far as diagnostic considerations this pattern continues to not be consistent with any typical pattern of degenerative or progressive dementia either cortical or subcortical.  There continue to be some patterns of  lateralization that continue to be identified with left hemisphere performing more effectively and efficiently than right hemisphere and some of these may be features of longstanding cognitive strengths with regard to auditory memory and learning and auditory performance.  Again, there are no indications of any progression with regard to her deficits and they have remained either stable or showing significant improvements particularly in the domain of memory and learning.  As far as recommendations going forward I do think that we should address ongoing difficulties with restless sleep patterns and the patient is also quite concerned and hypervigilant of her any difficulties which may also be exacerbating some of her symptoms.  She may benefit from the initiation of an SSRI medication.  The patient has been taking Aricept more recently but I am not sure whether this will provide any significant improvement as currently objective assessments of memory and learning are quite good and encouraging with significant improvements recently prior to the addition of Aricept.

## 2020-12-23 DIAGNOSIS — R42 Dizziness and giddiness: Secondary | ICD-10-CM

## 2020-12-25 DIAGNOSIS — I10 Essential (primary) hypertension: Secondary | ICD-10-CM | POA: Diagnosis not present

## 2020-12-25 DIAGNOSIS — E039 Hypothyroidism, unspecified: Secondary | ICD-10-CM | POA: Diagnosis not present

## 2020-12-25 DIAGNOSIS — G4733 Obstructive sleep apnea (adult) (pediatric): Secondary | ICD-10-CM | POA: Diagnosis not present

## 2020-12-25 DIAGNOSIS — I5030 Unspecified diastolic (congestive) heart failure: Secondary | ICD-10-CM | POA: Diagnosis not present

## 2020-12-25 DIAGNOSIS — R413 Other amnesia: Secondary | ICD-10-CM | POA: Diagnosis not present

## 2021-01-02 ENCOUNTER — Other Ambulatory Visit: Payer: Self-pay

## 2021-01-02 ENCOUNTER — Ambulatory Visit: Payer: Medicare PPO | Attending: Neurology | Admitting: Physical Therapy

## 2021-01-02 DIAGNOSIS — R2681 Unsteadiness on feet: Secondary | ICD-10-CM | POA: Insufficient documentation

## 2021-01-02 DIAGNOSIS — H8111 Benign paroxysmal vertigo, right ear: Secondary | ICD-10-CM | POA: Insufficient documentation

## 2021-01-02 DIAGNOSIS — R42 Dizziness and giddiness: Secondary | ICD-10-CM | POA: Diagnosis not present

## 2021-01-02 NOTE — Patient Instructions (Signed)
Sit to Stand: Phase 3 - try to perform without use of hands    Sitting, squeeze pelvic floor and hold. Lean trunk forward. Push up on arms and stand up. Relax. Repeat _10__ times. Do _1__ times a day.    Standing Marching   Using a chair if necessary, march in place. Repeat 10 times. Do 1 sessions per day.  http://[email or web address].exer.us/344    Hip Backward Kick   Using a chair for balance, keep legs shoulder width apart and toes pointed for- ward. Slowly extend one leg back, keeping knee straight. Do not lean forward. Repeat with other leg. Repeat 10 times. Do 1 sessions per day.  http://[email or web address].exer.us/340      Hip Side Kick   Holding a chair for balance, keep legs shoulder width apart and toes pointed forward. Swing a leg out to side, keeping knee straight. Do not lean. Repeat using other leg. Repeat 10 times. Do 1 sessions per day.  http://[email or web address].exer.us/342   Do FORWARD KICKS - ALTERNATING KICKS - 10 REPS EACH LEG      Feet Heel-Toe "Tandem", Varied Arm Positions - Eyes Open - PARTIAL    With eyes open, right foot directly in front of the other, arms out, look straight ahead at a stationary object. Hold 30 seconds. Repeat 2  times per session. Do 1 sessions per day.      Standing On One Leg Without Support .  Stand on one leg in neutral spine without support. Hold 10 seconds. Repeat on other leg. Do 2 repetitions, 1 set.  http://bt.exer.us/36   Copyright  VHI. All rights reserved.    Side-Stepping   Walk to left side with eyes open. Take even steps, leading with same foot. Make sure each foot lifts off the floor. Repeat in opposite direction.  Do 2-3 times each direction - do crossovers in front first; then do stepping behind.     AMBULATION: Walk Backward   Walk backward. Take large steps, do not drag feet. 2 reps per set, 1 sets per day, 5 days per week Use assistive device.     Braiding   Move to side: 1) cross right leg in front of left, 2)  bring back leg out to side, then 3) cross right leg behind left, 4) bring left leg out to side. Continue sequence in same direction. Reverse sequence, moving in opposite direction. Repeat sequence 3 times per session. Do 1 sessions per day.

## 2021-01-03 NOTE — Therapy (Signed)
[facility] [address] [place], [place], [address] Phone: [phone]   Fax:  [phone]  Physical Therapy Treatment  Patient Details  Name: Terri Ortega MRN: [id number] Date of Birth: 09-15-1940 Referring Provider (PT): Dr. Delfin Edis   Encounter Date: 01/02/2021   PT End of Session - 01/03/21 1354    Visit Number 3    Number of Visits 5    Date for PT Re-Evaluation 01/03/21    Authorization Type [employer]    Authorization Time Period 12-03-20 - 02-02-21    Authorization - Visit Number 3    Authorization - Number of Visits 6    PT Start Time 1316    PT Stop Time 1400    PT Time Calculation (min) 44 min    Activity Tolerance Patient tolerated treatment well    Behavior During Therapy [facility] for tasks assessed/performed           Past Medical History:  Diagnosis Date  . Graves disease   . HTN (hypertension)   . Low back pain    Dr. Ronnald Ramp  . Osteoporosis   . Pneumonia 2012  . Radiation 03/20/14, 03/27/14, 04/05/14, 04/12/14, 04/17/14   bracytherapy to proximal vagina 30 gray  . Scoliosis   . Uterine cancer ([place])    surgery and radiation x 5 treatments-last 9'15  . Vitamin D deficiency     Past Surgical History:  Procedure Laterality Date  . BUNIONECTOMY     left  . CATARACT EXTRACTION, BILATERAL Bilateral    last done 11-21-14  . COLONOSCOPY WITH PROPOFOL N/A 12/25/2014   Procedure: COLONOSCOPY WITH PROPOFOL;  Surgeon: Garlan Fair, MD;  Location: [facility];  Service: Endoscopy;  Laterality: N/A;  . FOOT SURGERY Left 2004   Dr Johnnye Sima  . JOINT REPLACEMENT     RTKA  . ROBOTIC ASSISTED TOTAL HYSTERECTOMY WITH BILATERAL SALPINGO OOPHERECTOMY  01/30/14   with lymph node biopsy  . TOTAL KNEE ARTHROPLASTY     right  . TOTAL KNEE REVISION Right 01/04/2019   Procedure: TOTAL KNEE REVISION;  Surgeon: Gaynelle Arabian, MD;  Location: [facility];  Service: Orthopedics;  Laterality: Right;   164mn with block    There were no vitals filed for this visit.   Subjective Assessment - 01/02/21 1326    Subjective Pt accompanied to PT by her daugther, [name] - she reports her mother has never really complained of alot of dizziness but more imbalance; she states she feels her mother's leg muscles have atrophied due to lack of activity for past several years; pt requests that we check to make sure "crystals are in place"    Patient is accompained by: Family member   daugther [name]   Pertinent History orthostatic hypotension, h/o uterine cancer with surgery & radiation, Graves disease, HTN, osteoporosis, failed Rt TKA    Patient Stated Goals "Be able to walk comfortably"; states children gave her a recumbent bike for Christmas and she uses it twice a day    Currently in Pain? No/denies                   Vestibular Assessment - 01/03/21 0001      Positional Testing   Dix-Hallpike Dix-Hallpike Right      Dix-Hallpike Right   Dix-Hallpike Right Duration none    Dix-Hallpike Right Symptoms No nystagmus                    [facility] Adult  PT Treatment/Exercise - 01/03/21 0001      Transfers   Transfers Sit to Stand    Number of Reps Other reps (comment)   5   Comments no UE support used - performed from mat table      Standardized Balance Assessment   Standardized Balance Assessment Timed Up and Go Test      Timed Up and Go Test   TUG Normal TUG    Normal TUG (seconds) 17.36   with use of SPC              Balance Exercises - 01/03/21 0001      Balance Exercises: Standing   Sidestepping 2 reps   UE support needed for balance and for safety   Marching Solid surface;Static;10 reps    Other Standing Exercises Pt performed balance HEP - alternating forward, back and side kicks; marching; sidestepping;  partial tandem stance each position 30 sec hold; SLS 2 reps each leg 10 sec hold; crossovers front along counter 2 reps; stepping behind 2 reps with UE support              PT Education - 01/03/21 1353    Education Details Balance HEP and sit to stand    Person(s) Educated Patient;Child(ren)    Methods Explanation;Demonstration;Handout    Comprehension Verbalized understanding;Returned demonstration            PT Short Term Goals - 12/04/20 1315      PT SHORT TERM GOAL #1   Title same as LTG's             PT Long Term Goals - 01/03/21 1359      PT LONG TERM GOAL #1   Title Pt will have a (-) Rt Dix-Hallpike test with no nystagmus and no c/o vertigo to indicate resolution of Rt BPPV.    Baseline met 01-02-21    Time 4    Period Weeks    Status Achieved      PT LONG TERM GOAL #2   Title Pt will verbalize understanding of techniques to stabilize BP with transitional movements including sit to stand and supine to sit.    Time 4    Period Weeks    Status New      PT LONG TERM GOAL #3   Title Increase FOTO score from 45/100 to >/= 55/100 and increase DPS score from 55/100 to >/= 60/100 to indicate improvement in dizziness.    Time 4    Period Weeks    Status New      PT LONG TERM GOAL #4   Title Independent in HEP for habituation and self treatment of BPPV as needed if pt has re-occurrence of BPPV in future.    Time 4    Period Weeks    Status New                 Plan - 01/03/21 1355    Clinical Impression Statement Rollator would be beneficial in providing stability and safety with community amb. distances and would assist in minimizing gait deviations with more symmetry in shoulder height noted with use of this device than with SPC, however, pt declines use of rollator at this time and states she prefers to use her cane.  Pt states she has standard RW at home but does not currently use.  Balance HEP was issued - pt requires UE support for safety due to decreased balance with SLS.  Cont with POC.  Personal Factors and Comorbidities Comorbidity 2;Past/Current Experience;Time since onset of injury/illness/exacerbation     Comorbidities h/o uterine cancer with surgery & radiation, osteoporosis, orthostatic hypotension, HTN, Graves disease, low back pain    Examination-Activity Limitations Bed Mobility;Stand;Transfers;Reach Overhead;Locomotion Level;Squat;Bend    Examination-Participation Restrictions Cleaning;Community Activity;Laundry;Shop;Meal Prep    Stability/Clinical Decision Making Evolving/Moderate complexity    Rehab Potential Good    PT Frequency 1x / week    PT Duration 4 weeks    PT Treatment/Interventions ADLs/Self Care Home Management;Canalith Repostioning;Balance training;Neuromuscular re-education;Patient/family education;Vestibular;Gait training;Therapeutic activities;Therapeutic exercise    PT Next Visit Plan check balance HEP - do strengthening exs.    Consulted and Agree with Plan of Care Patient           Patient will benefit from skilled therapeutic intervention in order to improve the following deficits and impairments:  Dizziness,Decreased balance,Difficulty walking  Visit Diagnosis: BPPV (benign paroxysmal positional vertigo), right  Unsteadiness on feet     Problem List Patient Active Problem List   Diagnosis Date Noted  . Dilated cardiomyopathy ([place]) 12/14/2020  . 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  . Orthostatic dizziness 11/14/2020  . Educated about COVID-19 virus infection 12/21/2019  . Cognitive change 09/06/2019  . 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 Preop cardiovascular exam 12/20/2018  . 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 Uterine cancer ([place]) 03/28/2018  . Arthralgia of hip or thigh 03/28/2018  . Benign essential HTN 03/28/2018  . Overweight 03/28/2018  . Heart disease 11/09/2014  . Leukocytosis 10/31/2014  . Diastolic congestive heart failure ([place]) 10/30/2014  . Hypothyroidism 10/30/2014  . Chest pain at rest 10/30/2014  . Chest pain 10/30/2014  .  Endometrial cancer ([place]) 01/30/2014  . Dyspnea 01/11/2014  . Edema 01/11/2014  . Low back pain with right-sided sciatica 12/13/2013  . Degeneration of lumbar or lumbosacral intervertebral disc 12/13/2013  . Sciatica 12/13/2013    Dild[place]Jenness [place], PT 01/03/2021, 2:00 PM  [facility] [address] [place], [place], [address] Phone: [phone]   Fax:  [phone]  Name: Terri Ortega MRN: [id number] Date of Birth: January 02, 1941

## 2021-01-06 ENCOUNTER — Encounter: Payer: Self-pay | Admitting: *Deleted

## 2021-01-13 ENCOUNTER — Other Ambulatory Visit: Payer: Self-pay

## 2021-01-13 ENCOUNTER — Ambulatory Visit (INDEPENDENT_AMBULATORY_CARE_PROVIDER_SITE_OTHER): Payer: Medicare PPO | Admitting: Podiatry

## 2021-01-13 DIAGNOSIS — M7742 Metatarsalgia, left foot: Secondary | ICD-10-CM

## 2021-01-13 DIAGNOSIS — R2681 Unsteadiness on feet: Secondary | ICD-10-CM

## 2021-01-13 DIAGNOSIS — M7741 Metatarsalgia, right foot: Secondary | ICD-10-CM

## 2021-01-13 NOTE — Progress Notes (Signed)
   HPI: 80 y.o. female presenting today for follow-up evaluation of unsteadiness of gait.  Patient states that ever since she had right knee replacement surgery June 2020 she has been experiencing unsteadiness of gait.  Although she has not fallen she states that she feels that she is prone to falling.  She is currently going to physical therapy with minimal improvement.  She presents for further treatment and evaluation  Past Medical History:  Diagnosis Date   Graves disease    HTN (hypertension)    Low back pain    Dr. Ronnald Ramp   Osteoporosis    Pneumonia 2012   Radiation 03/20/14, 03/27/14, 04/05/14, 04/12/14, 04/17/14   bracytherapy to proximal vagina 30 gray   Scoliosis    Uterine cancer ([place])    surgery and radiation x 5 treatments-last 9'15   Vitamin D deficiency      Physical Exam: General: The patient is alert and oriented x3 in no acute distress.  Dermatology: Skin is warm, dry and supple bilateral lower extremities. Negative for open lesions or macerations.  Vascular: Palpable pedal pulses bilaterally. No edema or erythema noted. Capillary refill within normal limits.  Neurological: Epicritic and protective threshold grossly intact bilaterally.   Musculoskeletal Exam: Range of motion within normal limits to all pedal and ankle joints bilateral. Muscle strength 5/5 in all groups bilateral.  With weightbearing of the foot there does appear to be a forefoot varus to the right foot.   Assessment: 1.  Unsteadiness of gait during ambulation 2.  Forefoot varus   Plan of Care:  1. Patient evaluated. 2.  I would like to keep her treatment conservative for the moment.  We will start with custom molded orthotics to see if this helps with her gait stability.  She will want to wear these in sandals, so custom sandals may be an option 3.  Appointment with Pedorthist for custom molded orthotics or sandals 4.  If the orthotics do not help alleviate her instability of gait, we may need to be  more aggressive and place her in balance bracing 5.  In the meantime continue physical therapy 6.  Return to clinic in 2 months  *Grandson is Lorre Munroe, [place] @ [facility]. [place] in [place] M. Deara Bober, DPM [facility]  Dr. Edrick Kins, DPM    [address], [place] [address]                Office [phone]  Fax [phone]

## 2021-01-20 ENCOUNTER — Ambulatory Visit: Payer: Medicare PPO | Admitting: Physical Therapy

## 2021-01-20 ENCOUNTER — Other Ambulatory Visit: Payer: Self-pay

## 2021-01-20 DIAGNOSIS — H8111 Benign paroxysmal vertigo, right ear: Secondary | ICD-10-CM | POA: Diagnosis not present

## 2021-01-20 DIAGNOSIS — M25552 Pain in left hip: Secondary | ICD-10-CM | POA: Diagnosis not present

## 2021-01-20 DIAGNOSIS — R2681 Unsteadiness on feet: Secondary | ICD-10-CM

## 2021-01-20 DIAGNOSIS — Z96651 Presence of right artificial knee joint: Secondary | ICD-10-CM | POA: Diagnosis not present

## 2021-01-21 NOTE — Therapy (Signed)
[facility] [address] [place], [place], [address] Phone: [phone]   Fax:  [phone]  Physical Therapy Treatment  Patient Details  Name: Terri Ortega MRN: [id number] Date of Birth: 09-21-1940 Referring Provider (PT): Dr. Delfin Edis   Encounter Date: 01/20/2021   PT End of Session - 01/21/21 1123     Visit Number 4    Number of Visits 5    Date for PT Re-Evaluation 01/03/21    Authorization Type [employer]    Authorization Time Period 12-03-20 - 02-02-21    Authorization - Visit Number 4    Authorization - Number of Visits 6    PT Start Time 1017    PT Stop Time 1100    PT Time Calculation (min) 43 min    Activity Tolerance Patient tolerated treatment well    Behavior During Therapy [facility] for tasks assessed/performed             Past Medical History:  Diagnosis Date   Graves disease    HTN (hypertension)    Low back pain    Dr. Ronnald Ramp   Osteoporosis    Pneumonia 2012   Radiation 03/20/14, 03/27/14, 04/05/14, 04/12/14, 04/17/14   bracytherapy to proximal vagina 30 gray   Scoliosis    Uterine cancer ([place])    surgery and radiation x 5 treatments-last 9'15   Vitamin D deficiency     Past Surgical History:  Procedure Laterality Date   BUNIONECTOMY     left   CATARACT EXTRACTION, BILATERAL Bilateral    last done 11-21-14   COLONOSCOPY WITH PROPOFOL N/A 12/25/2014   Procedure: COLONOSCOPY WITH PROPOFOL;  Surgeon: Garlan Fair, MD;  Location: [facility];  Service: Endoscopy;  Laterality: N/A;   FOOT SURGERY Left 2004   Dr Johnnye Sima   JOINT REPLACEMENT     RTKA   ROBOTIC ASSISTED TOTAL HYSTERECTOMY WITH BILATERAL SALPINGO OOPHERECTOMY  01/30/14   with lymph node biopsy   TOTAL KNEE ARTHROPLASTY     right   TOTAL KNEE REVISION Right 01/04/2019   Procedure: TOTAL KNEE REVISION;  Surgeon: Gaynelle Arabian, MD;  Location: [facility];  Service: Orthopedics;  Laterality: Right;  149mn with block     There were no vitals filed for this visit.   Subjective Assessment - 01/20/21 1019     Subjective Pt states she has not used her walker since last treatment session, has only used her cane; states she "has not been as diligent as I should in doing exercises"; has been busy with having different appointments    Patient is accompained by: Family member   daugther [name]   Pertinent History orthostatic hypotension, h/o uterine cancer with surgery & radiation, Graves disease, HTN, osteoporosis, failed Rt TKA    Patient Stated Goals "Be able to walk comfortably"; states children gave her a recumbent bike for Christmas and she uses it twice a day    Currently in Pain? No/denies                               [facility] - 01/21[facility]21 0001       Transfers   Transfers Sit to Stand;Stand to Sit    Number of Reps Other reps (comment)   5 reps   Comments no UE support used - performed from standard chair  Balance Exercises - 01/21/21 1116       Balance Exercises: Standing   Standing Eyes Opened Wide (BOA);Head turns;Foam/compliant surface;5 reps   horizontal and vertical   Standing Eyes Closed Wide (BOA);Head turns;Foam/compliant surface;5 reps   horizontal and vertical   Rockerboard Anterior/posterior;EO;10 reps;Intermittent UE support    Sidestepping 2 reps   along // bars with minimal UE support   Step Over Hurdles / Cones pt performed alternating stepping over balance beam 10 reps each leg with UE support on // bar prn    Marching Solid surface;Head turns;Static;5 reps   horizontal head turns with UE support prn   Heel Raises Both;10 reps    Other Standing Exercises Pt performed cone taps to 2 cones; c/o Lt groin pain with lifting LLE; no UE support used with this activity    Other Standing Exercises Comments Tap ups to 1st step - RLE only due to c/o pain in Lt hip with active hip flexion                 PT Short  Term Goals - 12/04/20 1315       PT SHORT TERM GOAL #1   Title same as LTG's               PT Long Term Goals - 01/21/21 1129       PT LONG TERM GOAL #1   Title Pt will have a (-) Rt Dix-Hallpike test with no nystagmus and no c/o vertigo to indicate resolution of Rt BPPV.    Baseline met 01-02-21    Time 4    Period Weeks    Status Achieved      PT LONG TERM GOAL #2   Title Pt will verbalize understanding of techniques to stabilize BP with transitional movements including sit to stand and supine to sit.    Time 4    Period Weeks    Status New      PT LONG TERM GOAL #3   Title Increase FOTO score from 45/100 to >/= 55/100 and increase DPS score from 55/100 to >/= 60/100 to indicate improvement in dizziness.    Time 4    Period Weeks    Status New      PT LONG TERM GOAL #4   Title Independent in HEP for habituation and self treatment of BPPV as needed if pt has re-occurrence of BPPV in future.    Time 4    Period Weeks    Status New                   Plan - 01/21/21 1124     Clinical Impression Statement Pt reporting discomfort in Lt hip flexor region with active Lt hip flexion in today's session.  Pt continues to use [facility] for assistance with ambulation and declines use of RW or rollator at this time.  Pt demonstrates decreased push off in stance on LLE due to plantarflexor weakness.  Pt reported unsteadiness with balance activities with head turns on compliant surfaces, indicative of decreased vestibular input in maintaining balance.  Cont with POC.    Personal Factors and Comorbidities Comorbidity 2;Past/Current Experience;Time since onset of injury/illness/exacerbation    Comorbidities h/o uterine cancer with surgery & radiation, osteoporosis, orthostatic hypotension, HTN, Graves disease, low back pain    Examination-Activity Limitations Bed Mobility;Stand;Transfers;Reach Overhead;Locomotion Level;Squat;Bend    Examination-Participation Restrictions  Cleaning;Community Activity;Laundry;Shop;Meal Prep    Stability/Clinical Decision Making Evolving/Moderate complexity    Rehab Potential  Good    PT Frequency 1x / week    PT Duration 4 weeks    PT Treatment/Interventions ADLs/Self Care Home Management;Canalith Repostioning;Balance training;Neuromuscular re-education;Patient/family education;Vestibular;Gait training;Therapeutic activities;Therapeutic exercise    PT Next Visit Plan check balance HEP - do strengthening exs.    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   Preop cardiovascular exam 12/20/2018   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   Uterine cancer ([place]) 03/28/2018   Arthralgia of hip or thigh 03/28/2018   Benign essential HTN 03/28/2018   Overweight 03/28/2018   Heart disease 11/09/2014   Leukocytosis 19/47/1252   Diastolic congestive heart failure ([place]) 10/30/2014   Hypothyroidism 10/30/2014   Chest pain at rest 10/30/2014   Chest pain 10/30/2014   Endometrial cancer ([place]) 01/30/2014   Dyspnea 01/11/2014   Edema 01/11/2014   Low back pain with right-sided sciatica 12/13/2013   Degeneration of lumbar or lumbosacral intervertebral disc 12/13/2013   Sciatica 12/13/2013    Alda Lea, PT 01/21/2021, 11:30 [facility] [address] [place], [place], [address] Phone: [phone]   Fax:  [phone]  Name: Terri Ortega MRN: [id number] Date of Birth: 02/04/1941

## 2021-01-27 ENCOUNTER — Other Ambulatory Visit: Payer: Self-pay

## 2021-01-27 ENCOUNTER — Ambulatory Visit: Payer: Medicare PPO | Admitting: Physical Therapy

## 2021-01-27 DIAGNOSIS — R2681 Unsteadiness on feet: Secondary | ICD-10-CM

## 2021-01-27 DIAGNOSIS — H8111 Benign paroxysmal vertigo, right ear: Secondary | ICD-10-CM | POA: Diagnosis not present

## 2021-01-28 NOTE — Therapy (Signed)
[facility] [address] [place], [place], [address] Phone: [phone]   Fax:  [phone]  Physical Therapy Treatment & Discharge Summary  Patient Details  Name: Terri Ortega MRN: [id number] Date of Birth: October 22, 1940 Referring Provider (PT): Dr. Delfin Edis   Encounter Date: 01/27/2021   PT End of Session - 01/28/21 2000     Visit Number 5    Number of Visits 5    Date for PT Re-Evaluation 01/03/21    Authorization Type [employer]    Authorization Time Period 12-03-20 - 02-02-21    Authorization - Visit Number 5    Authorization - Number of Visits 6    PT Start Time 8850    PT Stop Time 1102    PT Time Calculation (min) 43 min    Activity Tolerance Patient tolerated treatment well    Behavior During Therapy [facility] for tasks assessed/performed             Past Medical History:  Diagnosis Date   Graves disease    HTN (hypertension)    Low back pain    Dr. Ronnald Ramp   Osteoporosis    Pneumonia 2012   Radiation 03/20/14, 03/27/14, 04/05/14, 04/12/14, 04/17/14   bracytherapy to proximal vagina 30 gray   Scoliosis    Uterine cancer ([place])    surgery and radiation x 5 treatments-last 9'15   Vitamin D deficiency     Past Surgical History:  Procedure Laterality Date   BUNIONECTOMY     left   CATARACT EXTRACTION, BILATERAL Bilateral    last done 11-21-14   COLONOSCOPY WITH PROPOFOL N/A 12/25/2014   Procedure: COLONOSCOPY WITH PROPOFOL;  Surgeon: Garlan Fair, MD;  [facility];  Service: Endoscopy;  Laterality: N/A;   FOOT SURGERY Left 2004   Dr Johnnye Sima   JOINT REPLACEMENT     RTKA   ROBOTIC ASSISTED TOTAL HYSTERECTOMY WITH BILATERAL SALPINGO OOPHERECTOMY  01/30/14   with lymph node biopsy   TOTAL KNEE ARTHROPLASTY     right   TOTAL KNEE REVISION Right 01/04/2019   Procedure: TOTAL KNEE REVISION;  Surgeon: Gaynelle Arabian, MD;  [facility];  Service: Orthopedics;  Laterality: Right;   182mn with block    There were no vitals filed for this visit.                      [place] 01[place]28[place]21 0001       Transfers   Transfers Sit to Stand;Stand to Sit    Number of Reps Other reps (comment)   5   Comments no UE support used - performed from standard chair      Ambulation/Gait   Ambulation/Gait Yes    Ambulation/Gait Assistance 5: Supervision    Ambulation Distance (Feet) 100 Feet    Assistive device Straight cane   with small quad tip   Gait Pattern Step-through pattern    Ambulation Surface Level;Indoor                 Balance Exercises - 01/28/21 0001       Balance Exercises: Standing   Rockerboard Anterior/posterior;EO;10 reps;Intermittent UE support    Sidestepping 2 reps   inside // bars   Step Over Hurdles / Cones performed stepping over ornage hurdle with RLE - not performed with LLE due to c/o Lt hip flexor pain with lifting LLE    Marching Solid surface;Head turns;Static;5 reps  horizontal head turns with UE support prn   Heel Raises Both;10 reps    Other Standing Exercises Cone taps with RLE only - 3 reps to 1 cone; not performed with LLE due to c/o discomfort in Lt hip flexor region    Other Standing Exercises Comments Tap ups to 1st step - RLE only due to c/o pain in Lt hip with active hip flexion                 PT Short Term Goals - 12/04/20 1315       PT SHORT TERM GOAL #1   Title same as LTG's               PT Long Term Goals - 01/27/21 1022       PT LONG TERM GOAL #1   Title Pt will have a (-) Rt Dix-Hallpike test with no nystagmus and no c/o vertigo to indicate resolution of Rt BPPV.    Baseline met 01-02-21    Time 4    Period Weeks    Status Achieved      PT LONG TERM GOAL #2   Title Pt will verbalize understanding of techniques to stabilize BP with transitional movements including sit to stand and supine to sit.    Time 4    Period Weeks    Status Achieved       PT LONG TERM GOAL #3   Title Increase FOTO score from 45/100 to >/= 55/100 and increase DPS score from 55/100 to >/= 60/100 to indicate improvement in dizziness.    Time 4    Period Weeks    Status Achieved      PT LONG TERM GOAL #4   Title Independent in HEP for habituation and self treatment of BPPV as needed if pt has re-occurrence of BPPV in future.    Time 4    Period Weeks    Status Achieved                   Plan - 01/28/21 2002     Clinical Impression Statement Pt has met 4/4 LTG's; Rt BPPV remains resolved at this time.  RW has been recommended for assistance with ambulation for increased safety, but pt continues to decline use of this device at this time and cont. to use SPC. Pt is discharged due to LTG's met and completion of program.    Personal Factors and Comorbidities Comorbidity 2;Past/Current Experience;Time since onset of injury/illness/exacerbation    Comorbidities h/o uterine cancer with surgery & radiation, osteoporosis, orthostatic hypotension, HTN, Graves disease, low back pain    Examination-Activity Limitations Bed Mobility;Stand;Transfers;Reach Overhead;Locomotion Level;Squat;Bend    Examination-Participation Restrictions Cleaning;Community Activity;Laundry;Shop;Meal Prep    Stability/Clinical Decision Making Evolving/Moderate complexity    Rehab Potential Good    PT Frequency 1x / week    PT Duration 4 weeks    PT Treatment/Interventions ADLs/Self Care Home Management;Canalith Repostioning;Balance training;Neuromuscular re-education;Patient/family education;Vestibular;Gait training;Therapeutic activities;Therapeutic exercise    PT Next Visit Plan D/C on 01-27-21    Consulted and Agree with Plan of Care Patient             Patient will benefit from skilled therapeutic intervention in order to improve the following deficits and impairments:  Dizziness, Decreased balance, Difficulty walking  Visit Diagnosis: Unsteadiness on feet     Problem  List Patient Active Problem List   Diagnosis Date Noted   Dilated cardiomyopathy ([place]) 12/14/2020   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  Orthostatic dizziness 11/14/2020   Educated about COVID-19 virus infection 12/21/2019   Cognitive change 09/06/2019   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   Preop cardiovascular exam 12/20/2018   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   Uterine cancer ([place]) 03/28/2018   Arthralgia of hip or thigh 03/28/2018   Benign essential HTN 03/28/2018   Overweight 03/28/2018   Heart disease 11/09/2014   Leukocytosis 16/55/3748   Diastolic congestive heart failure ([place]) 10/30/2014   Hypothyroidism 10/30/2014   Chest pain at rest 10/30/2014   Chest pain 10/30/2014   Endometrial cancer ([place]) 01/30/2014   Dyspnea 01/11/2014   Edema 01/11/2014   Low back pain with right-sided sciatica 12/13/2013   Degeneration of lumbar or lumbosacral intervertebral disc 12/13/2013   Sciatica 12/13/2013     PHYSICAL THERAPY DISCHARGE SUMMARY  Visits from Start of Care: 5  Current functional level related to goals / functional outcomes: See above for progress towards goals; Rt BPPV remains resolved at this time   Remaining deficits: Continued decreased standing balance Continued gait deviations Pt continues to c/o pain in Lt groin region with active Lt hip flexion (etiology unknown)   Education / Equipment: Pt has been instructed in balance HEP  Patient agrees to discharge. Patient goals were met. Patient is being discharged due to meeting the stated rehab goals.    Alda Lea, PT 01/28/2021, 8:06 PM  [facility] [address] [place], [place], [address] Phone: [phone]   Fax:  [phone]  Name: Terri Ortega MRN: [id number] Date of Birth: April 12, 1941

## 2021-02-14 ENCOUNTER — Other Ambulatory Visit: Payer: Medicare PPO

## 2021-02-17 DIAGNOSIS — E039 Hypothyroidism, unspecified: Secondary | ICD-10-CM | POA: Diagnosis not present

## 2021-02-17 DIAGNOSIS — M810 Age-related osteoporosis without current pathological fracture: Secondary | ICD-10-CM | POA: Diagnosis not present

## 2021-02-17 DIAGNOSIS — R2689 Other abnormalities of gait and mobility: Secondary | ICD-10-CM | POA: Diagnosis not present

## 2021-02-17 DIAGNOSIS — Z0000 Encounter for general adult medical examination without abnormal findings: Secondary | ICD-10-CM | POA: Diagnosis not present

## 2021-02-17 DIAGNOSIS — Z136 Encounter for screening for cardiovascular disorders: Secondary | ICD-10-CM | POA: Diagnosis not present

## 2021-02-17 DIAGNOSIS — I10 Essential (primary) hypertension: Secondary | ICD-10-CM | POA: Diagnosis not present

## 2021-02-25 ENCOUNTER — Telehealth: Payer: Self-pay | Admitting: Podiatry

## 2021-02-25 ENCOUNTER — Other Ambulatory Visit: Payer: Self-pay

## 2021-02-25 ENCOUNTER — Encounter: Payer: Medicare PPO | Admitting: Podiatry

## 2021-02-25 ENCOUNTER — Telehealth: Payer: Self-pay | Admitting: *Deleted

## 2021-02-25 NOTE — Telephone Encounter (Signed)
Left message for pt to call to discuss appt for orthotics that she was seen for today.

## 2021-02-25 NOTE — Telephone Encounter (Signed)
Orthotics ordered today

## 2021-03-12 ENCOUNTER — Telehealth: Payer: Self-pay | Admitting: Podiatry

## 2021-03-12 NOTE — Telephone Encounter (Signed)
Pt called and spoke to [facility] asking if her orthotics were in..  I returned call and left a detailed message that I needed to speak to her about the orthotics. I had left message on 7.26 for pt to call me. The note from Dr Amalia Hailey stated she was getting the orthotics for sandals or even possibly a custom sandal. I wanted to make sure before we ordered them that she was aware they are not going to work in sandals.

## 2021-03-17 ENCOUNTER — Ambulatory Visit: Payer: Medicare PPO | Admitting: Podiatry

## 2021-03-27 ENCOUNTER — Telehealth: Payer: Self-pay | Admitting: Podiatry

## 2021-03-27 NOTE — Telephone Encounter (Signed)
Pt and daughter showed up today asking about the orthotics pt was measured for.. they were seated in the sub waiting room for me to talk to about them. I had called 2 times and pt never returned my call.  In the note from Dr Rebekah Chesterfield he mentioned orthotics or custom sandals and I just wanted to make sure pt was aware that the orthotics are not going to fit into a open toed sandal. That the shoe has to be inclosed and have a removable insole other wise it will crowd her feet. She said that her husband had orthotics and they did not go all the way to the toe and I told her we could do that if she wanted to try it and see if that would work and if now we could send them back to get them made full length. Her and her daughter understood and said thank you. I confirmed they want to proceed and that they do not meet criteria for her insurance to pay for them and they are 438.00. I will order them and call when they come in.

## 2021-04-02 ENCOUNTER — Other Ambulatory Visit: Payer: Self-pay

## 2021-04-02 ENCOUNTER — Encounter: Payer: Medicare PPO | Admitting: Podiatry

## 2021-04-02 NOTE — Progress Notes (Signed)
This encounter was created in error - please disregard.

## 2021-04-16 DIAGNOSIS — Z23 Encounter for immunization: Secondary | ICD-10-CM | POA: Diagnosis not present

## 2021-04-22 ENCOUNTER — Telehealth: Payer: Self-pay | Admitting: Podiatry

## 2021-04-22 NOTE — Telephone Encounter (Signed)
Orthotics in.. lvm for pts daughter to call to schedule an appt to pick them up.

## 2021-04-25 ENCOUNTER — Other Ambulatory Visit: Payer: Self-pay

## 2021-04-25 ENCOUNTER — Ambulatory Visit (INDEPENDENT_AMBULATORY_CARE_PROVIDER_SITE_OTHER): Payer: Medicare PPO | Admitting: *Deleted

## 2021-04-25 DIAGNOSIS — I10 Essential (primary) hypertension: Secondary | ICD-10-CM | POA: Diagnosis not present

## 2021-04-25 DIAGNOSIS — G8929 Other chronic pain: Secondary | ICD-10-CM | POA: Diagnosis not present

## 2021-04-25 DIAGNOSIS — M1990 Unspecified osteoarthritis, unspecified site: Secondary | ICD-10-CM | POA: Diagnosis not present

## 2021-04-25 DIAGNOSIS — M7741 Metatarsalgia, right foot: Secondary | ICD-10-CM

## 2021-04-25 DIAGNOSIS — R32 Unspecified urinary incontinence: Secondary | ICD-10-CM | POA: Diagnosis not present

## 2021-04-25 DIAGNOSIS — M7742 Metatarsalgia, left foot: Secondary | ICD-10-CM

## 2021-04-25 DIAGNOSIS — R269 Unspecified abnormalities of gait and mobility: Secondary | ICD-10-CM | POA: Diagnosis not present

## 2021-04-25 DIAGNOSIS — M810 Age-related osteoporosis without current pathological fracture: Secondary | ICD-10-CM | POA: Diagnosis not present

## 2021-04-25 DIAGNOSIS — Z6831 Body mass index (BMI) 31.0-31.9, adult: Secondary | ICD-10-CM | POA: Diagnosis not present

## 2021-04-25 DIAGNOSIS — E039 Hypothyroidism, unspecified: Secondary | ICD-10-CM | POA: Diagnosis not present

## 2021-04-25 DIAGNOSIS — E669 Obesity, unspecified: Secondary | ICD-10-CM | POA: Diagnosis not present

## 2021-04-25 NOTE — Progress Notes (Signed)
Patient presents today to pick up custom molded foot orthotics, diagnosed with metatarsalgia by Dr. Amalia Hailey.   Orthotics were dispensed and fit was satisfactory. Reviewed instructions for break-in and wear. Written instructions given to patient.  Patient will follow up as needed.   [facility] - order # [id number]

## 2021-04-30 DIAGNOSIS — H04123 Dry eye syndrome of bilateral lacrimal glands: Secondary | ICD-10-CM | POA: Diagnosis not present

## 2021-04-30 DIAGNOSIS — H43813 Vitreous degeneration, bilateral: Secondary | ICD-10-CM | POA: Diagnosis not present

## 2021-04-30 DIAGNOSIS — H26493 Other secondary cataract, bilateral: Secondary | ICD-10-CM | POA: Diagnosis not present

## 2021-05-13 DIAGNOSIS — S61217A Laceration without foreign body of left little finger without damage to nail, initial encounter: Secondary | ICD-10-CM | POA: Diagnosis not present

## 2021-05-21 ENCOUNTER — Encounter: Payer: Self-pay | Admitting: Neurology

## 2021-05-21 ENCOUNTER — Ambulatory Visit: Payer: Medicare PPO | Admitting: Neurology

## 2021-05-21 VITALS — BP 173/79 | HR 58 | Ht 65.0 in | Wt 178.8 lb

## 2021-05-21 DIAGNOSIS — R2689 Other abnormalities of gait and mobility: Secondary | ICD-10-CM

## 2021-05-21 DIAGNOSIS — R531 Weakness: Secondary | ICD-10-CM

## 2021-05-21 DIAGNOSIS — R42 Dizziness and giddiness: Secondary | ICD-10-CM

## 2021-05-21 DIAGNOSIS — R413 Other amnesia: Secondary | ICD-10-CM | POA: Diagnosis not present

## 2021-05-21 DIAGNOSIS — G8929 Other chronic pain: Secondary | ICD-10-CM

## 2021-05-21 DIAGNOSIS — M25562 Pain in left knee: Secondary | ICD-10-CM | POA: Diagnosis not present

## 2021-05-21 DIAGNOSIS — R4189 Other symptoms and signs involving cognitive functions and awareness: Secondary | ICD-10-CM

## 2021-05-21 DIAGNOSIS — M25561 Pain in right knee: Secondary | ICD-10-CM | POA: Diagnosis not present

## 2021-05-21 DIAGNOSIS — R269 Unspecified abnormalities of gait and mobility: Secondary | ICD-10-CM | POA: Diagnosis not present

## 2021-05-21 MED ORDER — DONEPEZIL HCL 10 MG PO TABS: 10.0000 mg | ORAL_TABLET | Freq: Every day | ORAL | 11 refills | Status: DC

## 2021-05-21 NOTE — Patient Instructions (Signed)
Increase Aricept Consider Namenda  Donepezil tablets What is this medication? DONEPEZIL (doe NEP e zil) is used to treat mild to moderate dementia caused by Alzheimer's disease. This medicine may be used for other purposes; ask your health care provider or pharmacist if you have questions. COMMON BRAND NAME(S): Aricept What should I tell my care team before I take this medication? They need to know if you have any of these conditions: asthma or other lung disease difficulty passing urine head injury heart disease history of irregular heartbeat liver disease seizures (convulsions) stomach or intestinal disease, ulcers or stomach bleeding an unusual or allergic reaction to donepezil, other medicines, foods, dyes, or preservatives pregnant or trying to get pregnant breast-feeding How should I use this medication? Take this medicine by mouth with a glass of water. Follow the directions on the prescription label. You may take this medicine with or without food. Take this medicine at regular intervals. This medicine is usually taken before bedtime. Do not take it more often than directed. Continue to take your medicine even if you feel better. Do not stop taking except on your doctor's advice. If you are taking the 23 mg donepezil tablet, swallow it whole; do not cut, crush, or chew it. Talk to your pediatrician regarding the use of this medicine in children. Special care may be needed. Overdosage: If you think you have taken too much of this medicine contact a poison control center or emergency room at once. NOTE: This medicine is only for you. Do not share this medicine with others. What if I miss a dose? If you miss a dose, take it as soon as you can. If it is almost time for your next dose, take only that dose, do not take double or extra doses. What may interact with this medication? Do not take this medicine with any of the following medications: certain medicines for fungal  infections like itraconazole, fluconazole, posaconazole, and voriconazole cisapride dextromethorphan; quinidine dronedarone pimozide quinidine thioridazine This medicine may also interact with the following medications: antihistamines for allergy, cough and cold atropine bethanechol carbamazepine certain medicines for bladder problems like oxybutynin, tolterodine certain medicines for Parkinson's disease like benztropine, trihexyphenidyl certain medicines for stomach problems like dicyclomine, hyoscyamine certain medicines for travel sickness like scopolamine dexamethasone dofetilide ipratropium NSAIDs, medicines for pain and inflammation, like ibuprofen or naproxen other medicines for Alzheimer's disease other medicines that prolong the QT interval (cause an abnormal heart rhythm) phenobarbital phenytoin rifampin, rifabutin or rifapentine ziprasidone This list may not describe all possible interactions. Give your health care provider a list of all the medicines, herbs, non-prescription drugs, or dietary supplements you use. Also tell them if you smoke, drink alcohol, or use illegal drugs. Some items may interact with your medicine. What should I watch for while using this medication? Visit your doctor or health care professional for regular checks on your progress. Check with your doctor or health care professional if your symptoms do not get better or if they get worse. You may get drowsy or dizzy. Do not drive, use machinery, or do anything that needs mental alertness until you know how this drug affects you. What side effects may I notice from receiving this medication? Side effects that you should report to your doctor or health care professional as soon as possible: allergic reactions like skin rash, itching or hives, swelling of the face, lips, or tongue feeling faint or lightheaded, falls loss of bladder control seizures signs and symptoms of a dangerous change in  heartbeat or heart rhythm like chest pain; dizziness; fast or irregular heartbeat; palpitations; feeling faint or lightheaded, falls; breathing problems signs and symptoms of infection like fever or chills; cough; sore throat; pain or trouble passing urine signs and symptoms of liver injury like dark yellow or brown urine; general ill feeling or flu-like symptoms; light-colored stools; loss of appetite; nausea; right upper belly pain; unusually weak or tired; yellowing of the eyes or skin slow heartbeat or palpitations unusual bleeding or bruising vomiting Side effects that usually do not require medical attention (report to your doctor or health care professional if they continue or are bothersome): diarrhea, especially when starting treatment headache loss of appetite muscle cramps nausea stomach upset This list may not describe all possible side effects. Call your doctor for medical advice about side effects. You may report side effects to FDA at 1-800-FDA-1088. Where should I keep my medication? Keep out of reach of children. Store at room temperature between 15 and 30 degrees C (59 and 86 degrees F). Throw away any unused medicine after the expiration date. NOTE: This sheet is a summary. It may not cover all possible information. If you have questions about this medicine, talk to your doctor, pharmacist, or health care provider.  2022 Elsevier/Gold Standard (2018-07-11 10:33:41)

## 2021-05-21 NOTE — Progress Notes (Signed)
[facility]    Provider:  Dr Jaynee Eagles Requesting Provider: [name], MD Primary Care Provider:  Lawerance Cruel, MD  CC:  cognitive complaints and dizziness  05/23/2021: Her dizziness is better. She is unsure of her stepping. She is still driving. She drove to [place]. She declines driving test. She feels she is imbalanced. It is aggravating, daughter feels she did not follow through with PT and feels the imbalance is disuse. Discussed [facility], will refer to [facility] for aquatherapy.   HPI 11/12/2020:  Terri Ortega is a 80 y.o. female here as requested by [name], MD for dizziness. PMHx hypothyroidism, hypertension, obstructive sleep apnea (borderline not needing CPAP), diastolic congestive heart failure, osteoporosis, remote uterine cancer s/p hysterectomy 2015, memory difficulties and had a thorough evaluation including formal memory testing which was not consistent with a neurodegenerative disorder at this time.  She feels dizzy, here with her daughter, less than 6 months ago, she got out of bed ane morning and she fell, she didn't bump her head, she did not pass out, she felt dizzy. In the last 2 weeks she is comfortable because she is lightheaded, dizzy, the room may spin or she feels like there is movement when there is not, always when standing or walking, worse when she first gets up in the morning, she is not following instructions or wearing her compression stockings. She doesn't drink a lot of fluids, she had a sleep evaluation. No neck pain. Daughter thinks she may have some depression, we discussed that today, They don;t want to [place], we can monitor. No other focal neurologic deficits, associated symptoms, inciting events or modifiable factors.  I reviewed Dr. Lina Sar new notes, patient transiently with change of position dizziness, orthostatics negative, they stopped her diuretics, she was encouraged to wear support stockings, push  fluids, CT of the head was negative for any acute pathology, referred back to neurology for further evaluation of dizziness.  At last appointment in September 09, 2020 she still complained of some dizziness with change of position, she did sustain a fall because of dizziness, again CT of her head was negative for any acute pathology, she did voice concern for not eating as much and not drinking as much fluids therefore they stopped her diuretics, she was complaining of feeling dizzy when going from sitting to lying, lying to sitting, otherwise neuro exam was nonfocal.  Patient complains of symptoms per HPI as well as the following symptoms: dizziness, memory difficulty . Pertinent negatives and positives per HPI. All others negative  Interval history Dec 25, 2019: Here with husband and daughter to review work-up.  Reviewed MRI of the brain images as below, also reviewed findings of formal memory testing which were not consistent with degenerative dementia such as Alzheimer's, Lewy body or other cortical dementia, there was some pattern of lateralization to performance with almost all her deficits in the areas of visual processing and visual cognitive functioning, it could be very early in picking up some cognitive changes they will retest her in 9 months.  We offered her a sleep test due to her snoring and difficulty with achieving good restful sleep but she declined.  No significant depression or anxiety.  Answered all questions.  1.   Mild to moderate generalized cortical atrophy. 2.   T2/FLAIR hyperintense foci in the hemispheres consistent with moderate chronic microvascular ischemic change. 3.   Mild right chronic maxillary sinusitis. 4.   There were no acute findings and there  is a normal enhancement pattern.  HPI:  Terri Ortega is a 80 y.o. female here as requested by [name], MD for memory loss. PMHx high blood pressure, uterine cancer, osteoporosis, hypothyroidism, memory  difficulties, diastolic congestive heart failure, overweight.  I reviewed notes from Dr. Delfina Redwood, B12 458, TSH 4.27.  Patient recently presented for memory evaluation, patient considering better memory, she scored 26 out of 30 on the Mini-Mental status exam, I am not sure but the note states possibly she has been part of a financial scheme or the victim I am not sure, she could not recall any other specific issues with her memory, she did report being under a great deal expressed about the election in the pandemic.  I reviewed the report of a head CT from 2004, patient had a fall, head CT without contrast, no evidence of fracture or intracranial hemorrhage, mild atrophy, overall unremarkable per report (too old to be able to access images). She is here with her daughter who also provides much information. She had knee surgery in 01/2019 and she has improved but it is still hard for her, her knee hurts when she walks. She is very independent, she drives to the grocery store, still driving well, no falls, she does not think her memory has been a problem even since getting anesthesia, she feels her husband's memory problems are so obvious and he knows it, it is frustrating for her husband and she notices it, moreso names of people and she notices it, she hekps him keep appointments. They visit with family and grandchildren, she still enjoys socializing, seeing family, its hard with Covid and isolation and they can't see relatives such as her sister in law who is close by but can't spend time together. They grow collards in their garden. She does notice some memory changes in herself. Daughter says they feel patient's logic is impaired and some memory, the logic is impaired, she still texts but she cn get mixed up sometime, she had a scammer call her on the phone and a few years ago she gave $300 to someone she had no idea, to pay someone to keep her computer updated, he called again and fortunately she did not lose  anything again and she gave up talking to him and went to her bank and told them what had happened and they pulled up her account. The week before last she started to pull up the [employer] site for her husband she pays the bills and suddenly on her monitor she saw menus she called the number and he wanted $200 and she declined. Her mother died young, she was a foster child, she doesn't know her father. She likes to take a nap in  The afternoon. Discussed POA and HCPOA. She snores heavily, she drools on her pillow, she denies vivid dreams, no shaking, no shuffling, no depression or anxiety.   Reviewed notes, labs and imaging from outside physicians, which showed:   See above  Review of Systems: Patient complains of symptoms per HPI as well as the following symptoms: imbalance, disuse . Pertinent negatives and positives per HPI. All others negative     Social History   Socioeconomic History   Marital status: Married    Spouse name: Not on file   Number of children: 3   Years of education: Not on file   Highest education level: High school graduate  Occupational History   Occupation: administrative assitant  Tobacco Use   Smoking status: Never  Smokeless tobacco: Never  Vaping Use   Vaping Use: Never used  Substance and Sexual Activity   Alcohol use: Never   Drug use: Never   Sexual activity: Not Currently  Other Topics Concern   Not on file  Social History Narrative   Lives at home with husband, Rush [place].  Four grand and five great grands.     Caffeine: diet coke about 1-2 per day    Right handed   Social Determinants of Health   Financial Resource Strain: Not on file  Food Insecurity: Not on file  Transportation Needs: Not on file  Physical Activity: Not on file  Stress: Not on file  Social Connections: Not on file  Intimate Partner Violence: Not on file    Family History  Adopted: Yes  Problem Relation Age of Onset   Heart disease Mother 6       No details     Past Medical History:  Diagnosis Date   Graves disease    HTN (hypertension)    Low back pain    Dr. 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   Preop cardiovascular exam 12/20/2018   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   Uterine cancer ([place]) 03/28/2018   Arthralgia of hip or thigh 03/28/2018   Benign essential HTN 03/28/2018   Overweight 03/28/2018   Heart disease 11/09/2014   Leukocytosis 79/09/4095   Diastolic congestive heart failure ([place]) 10/30/2014   Hypothyroidism 10/30/2014   Chest pain at rest 10/30/2014   Chest pain 10/30/2014   Endometrial cancer ([place]) 01/30/2014   Dyspnea 01/11/2014   Edema 01/11/2014   Low back pain with right-sided sciatica 12/13/2013   Degeneration of lumbar or lumbosacral intervertebral disc 12/13/2013   Sciatica 12/13/2013    Past Surgical History:  Procedure Laterality Date   BUNIONECTOMY     left   CATARACT EXTRACTION, BILATERAL Bilateral    last done 11-21-14   COLONOSCOPY WITH PROPOFOL N/A 12/25/2014   Procedure: COLONOSCOPY WITH PROPOFOL;  Surgeon: Garlan Fair, MD;  Location: [facility];  Service: Endoscopy;  Laterality: N/A;   FOOT SURGERY Left 2004   Dr Johnnye Sima   JOINT REPLACEMENT     RTKA   ROBOTIC ASSISTED TOTAL HYSTERECTOMY WITH BILATERAL SALPINGO OOPHERECTOMY  01/30/14   with lymph node biopsy   TOTAL KNEE ARTHROPLASTY      right   TOTAL KNEE REVISION Right 01/04/2019   Procedure: TOTAL KNEE REVISION;  Surgeon: Gaynelle Arabian, MD;  Location: [facility];  Service: Orthopedics;  Laterality: Right;  173min with block    Current Outpatient Medications  Medication Sig Dispense Refill   amoxicillin (AMOXIL) 500 MG capsule Take 2,000 mg by mouth See admin instructions. Take 2000 mg 1 hour prior to dental work     CALCIUM PO Take by mouth daily.     Cholecalciferol (VITAMIN D3 PO) Take 2,000 Int'l Units by mouth daily.  Cyanocobalamin (B-12 PO) Take by mouth daily.     levothyroxine (SYNTHROID) 88 MCG tablet Take 88 mcg by mouth daily before breakfast.      Multiple Vitamins-Minerals (CENTRUM SILVER PO) Take by mouth.     Naproxen Sodium (ALEVE PO) Take 220 mg by mouth as needed.     OVER THE COUNTER MEDICATION Tums     amLODipine (NORVASC) 2.5 MG tablet Take 1 tablet (2.5 mg total) by mouth daily. 180 tablet 3   donepezil (ARICEPT) 10 MG tablet Take 1 tablet (10 mg total) by mouth at bedtime. 90 tablet 11   No current facility-administered medications for this visit.    Allergies as of 05/21/2021 - Review Complete 05/21/2021  Allergen Reaction Noted   Percocet [oxycodone-acetaminophen] Nausea And Vomiting 09/06/2019    Vitals: BP (!) 173/79   Pulse (!) 58   Ht 5\' 5"  (1.651 m)   Wt 178 lb 12.8 oz (81.1 kg)   BMI 29.75 kg/m  Last Weight:  Wt Readings from Last 1 Encounters:  05/21/21 178 lb 12.8 oz (81.1 kg)   Last Height:   Ht Readings from Last 1 Encounters:  05/21/21 5\' 5"  (1.651 m)     Physical exam: Exam: Gen: NAD, conversant, well nourised, obese, well groomed                     CV: RRR, no MRG. No Carotid Bruits. No peripheral edema, warm, nontender Eyes: Conjunctivae clear without exudates or hemorrhage  Neuro: Detailed Neurologic Exam  Speech:    Speech is normal; fluent and spontaneous with normal comprehension.  Cognition:  MMSE - Mini Mental State Exam 05/21/2021 09/06/2019   Orientation to time 4 5  Orientation to Place 5 5  Registration 3 3  Attention/ Calculation 1 2  Recall 2 3  Language- name 2 objects 2 2  Language- repeat 1 1  Language- follow 3 step command 3 3  Language- read & follow direction 1 1  Write a sentence 1 1  Copy design 1 0  Total score 24 26      Cranial Nerves:    The pupils are equal, round, and reactive to light.  Attempted funduscopy pupils to small visual fields are full to finger confrontation. Extraocular movements are intact. Trigeminal sensation is intact and the muscles of mastication are normal. The face is symmetric. The palate elevates in the midline. Hearing intact. Voice is normal. Shoulder shrug is normal. The tongue has normal motion without fasciculations.   Coordination:    Normal finger to nose.  Gait:    imbalance, antalgic possibly due to knee pain  Motor Observation:    No asymmetry, no atrophy, and no involuntary movements noted. Tone:    Normal muscle tone.    Posture:    Posture is normal. normal erect    Strength: weakness prox lower extrmities otherwise strength is V/V in the upper and lower limbs.      Sensation: intact to LT     Reflex Exam:  DTR's:    Deep tendon reflexes in the upper and lower extremities are symmetrical and 1+-2+ bilaterally.   Toes:    The toes are equiv bilaterally.   Clonus:    Clonus is absent.    Assessment/Plan: This is an extremely lovely 80 year old patient here with her equally lovely daughter for concerns of memory loss or more appropriately possibly poor judgment.  She is 80 years old, she is quite independent, she still drives, she  lives with her husband, however I am getting concerned when I hear things like she has been the victim of scammer, daughter is concerned for her getting lost at times and worries about her driving, I do think possibly she has some impaired judgment and insight,  I am not quite sure patient is a good historian and she clearly does  not like being at the appointment or questioned on her memory or driving. Her son-in-lase is Mardene Sayer, a pcp.  - Aquatherapy for lower extremity weakness, gait abnormality, weakness in an elderly patient with knee pain/joint pain. [facility]  - Dizziness: Improved per patient.  I do think this is mild orthostasis and I agree with the physician that patient needs to stand up carefully, wear compression stockings(she is not doing that today although she was informed to), manage blood pressure with primary care, and for the vertiginous symptoms we recommended vestibular therapy but she di dnot follow through.   - Daughter thinks she may have some depression, we discussed that today and will monitor clinically, patient will increase  aricept today.   - I am concerned about her memory loss but formal neurocognitive testing did not show a neurodegenerative disorder but we will continue to follow  - I reviewed sleep study, dated August 2021, borderline sleep apnea, no indication for CPAP or any other intervention for sleep disordered breathing. Her husband was diagnosed with sleep apnea cannot tolerate the cpap did I send note to [facility]y/[facility]meier about aspire or dental device?  - She declined a driving test. Daughter will monitor and place video monitoring in the car  - Patient is clearly not happy with being at this appointment and being questioned on her driving or memory   - Will send to [facility] for aquatherapy for her gait abnormality, imbalance and lower extremity weakness.   Orders Placed This Encounter  Procedures   Ambulatory referral to Physical Therapy    Meds ordered this encounter  Medications   donepezil (ARICEPT) 10 MG tablet    Sig: Take 1 tablet (10 mg total) by mouth at bedtime.    Dispense:  90 tablet    Refill:  11     PRIOR Appointment A/P: - MRI of the brain for reversible causes of dementia: Atrophy and chronic microvascular changes. -  findings of formal  memory testing which were not consistent with degenerative dementia such as Alzheimer's, Lewy body or other cortical dementia, there was some pattern of lateralization to performance with almost all her deficits in the areas of visual processing and visual cognitive functioning -May consider FDG PET scan in the future.  They are set up for repeat testing in 9 months and I do think that it is appropriate to wait and retest formal memory at that time.  Return to clinic after that.  Cc: [name], MD  Sarina Ill, MD  [facility] [address] [place], [place] [address]  Phone [phone] Fax [phone]  I spent over 40 minutes of face-to-face and non-face-to-face time with patient on the  1. Gait abnormality   2. Weakness   3. Chronic pain of both knees   4. Memory loss   5. Cognitive change   6. Imbalance   7. Dizziness     diagnosis.  This included previsit chart review, lab review, study review, order entry, electronic health record documentation, patient education on the different diagnostic and therapeutic options, counseling and coordination of care, risks and benefits of management, compliance, or  risk factor reduction

## 2021-05-23 ENCOUNTER — Encounter: Payer: Self-pay | Admitting: Neurology

## 2021-07-10 DIAGNOSIS — I359 Nonrheumatic aortic valve disorder, unspecified: Secondary | ICD-10-CM | POA: Insufficient documentation

## 2021-07-10 NOTE — Progress Notes (Signed)
Cardiology Office Note   Date:  07/11/2021   ID:  Terri Ortega, [name] October 19, 1940, MRN [id number]  PCP:  Lawerance Cruel, MD  Cardiologist:   Minus Breeding, MD   Chief Complaint  Patient presents with   Cardiomyopathy   Dizziness       History of Present Illness: Terri Ortega is a 80 y.o. female who presents for follow up of a mildly reduced ejection fraction.  This was noted 7 years ago.  Her EF was found to be 45% on an echocardiogram.  However, subsequent follow-up demonstrated to be normalized to 65%.  At the last visit she was having dizziness.  She has seen neurology.  On CT and she has some small vessel disease.  She had a monitor without arrhythmia.     Since I last saw her the dizziness is better.  She is having a problem with her gait and is going to start water aerobics.  Dyspnea Stress and anxiety.  Her husband was having intermittent confusion.  She denies any chest pressure, neck or arm discomfort.  She had no weight gain or edema.   Past Medical History:  Diagnosis Date   Graves disease    HTN (hypertension)    Low back pain    Dr. Ronnald Ramp   Osteoporosis    Pneumonia 2012   Radiation 03/20/14, 03/27/14, 04/05/14, 04/12/14, 04/17/14   bracytherapy to proximal vagina 30 gray   Scoliosis    Uterine cancer ([place])    surgery and radiation x 5 treatments-last 9'15   Vitamin D deficiency     Past Surgical History:  Procedure Laterality Date   BUNIONECTOMY     left   CATARACT EXTRACTION, BILATERAL Bilateral    last done 11-21-14   COLONOSCOPY WITH PROPOFOL N/A 12/25/2014   Procedure: COLONOSCOPY WITH PROPOFOL;  Surgeon: Garlan Fair, MD;  [facility];  Service: Endoscopy;  Laterality: N/A;   FOOT SURGERY Left 2004   Dr Johnnye Sima   JOINT REPLACEMENT     RTKA   ROBOTIC ASSISTED TOTAL HYSTERECTOMY WITH BILATERAL SALPINGO OOPHERECTOMY  01/30/14   with lymph node biopsy   TOTAL KNEE ARTHROPLASTY     right   TOTAL KNEE REVISION Right  01/04/2019   Procedure: TOTAL KNEE REVISION;  Surgeon: Gaynelle Arabian, MD;  Location[facility];  Service: Orthopedics;  Laterality: Right;  174min with block     Current Outpatient Medications  Medication Sig Dispense Refill   amoxicillin (AMOXIL) 500 MG capsule Take 2,000 mg by mouth See admin instructions. Take 2000 mg 1 hour prior to dental work     CALCIUM PO Take by mouth daily.     Cholecalciferol (VITAMIN D3 PO) Take 2,000 Int'l Units by mouth daily.     Cyanocobalamin (B-12 PO) Take by mouth daily.     donepezil (ARICEPT) 10 MG tablet Take 1 tablet (10 mg total) by mouth at bedtime. 90 tablet 11   ibandronate (BONIVA) 150 MG tablet ibandronate 150 mg tablet  TAKE 1 TABLET BY MOUTH EVERY MONTH.     levothyroxine (SYNTHROID) 88 MCG tablet Take 88 mcg by mouth daily before breakfast.      Multiple Vitamins-Minerals (CENTRUM SILVER PO) Take by mouth.     Naproxen Sodium (ALEVE PO) Take 220 mg by mouth as needed.     OVER THE COUNTER MEDICATION Tums     amLODipine (NORVASC) 2.5 MG tablet Take 1 tablet (2.5 mg total) by mouth daily. 180 tablet 3  No current facility-administered medications for this visit.    Allergies:   Percocet [oxycodone-acetaminophen]    ROS:  Please see the history of present illness.   Otherwise, review of systems are positive for diffuse bony aches with tenderness to none.   All other systems are reviewed and negative.    PHYSICAL EXAM: VS:  BP 140/60   Pulse 60   Ht 5\' 5"  (1.651 m)   Wt 178 lb (80.7 kg)   SpO2 96%   BMI 29.62 kg/m  , BMI Body mass index is 29.62 kg/m. GENERAL:  Well appearing NECK:  No jugular venous distention, waveform within normal limits, carotid upstroke brisk and symmetric, no bruits, no thyromegaly LUNGS:  Clear to auscultation bilaterally CHEST:  Unremarkable HEART:  PMI not displaced or sustained,S1 and S2 within normal limits, no S3, no S4, no clicks, no rubs, no murmurs ABD:  Flat, positive bowel sounds normal in  frequency in pitch, no bruits, no rebound, no guarding, no midline pulsatile mass, no hepatomegaly, no splenomegaly EXT:  2 plus pulses throughout, no edema, no cyanosis no clubbing   EKG:  EKG is  ordered today. The ekg ordered today demonstrates sinus rhythm, rate 60, left axis deviation, left ventricular hypertrophy by voltage criteria, lateral T wave inversions all unchanged from previous.   Recent Labs: No results found for requested labs within last 8760 hours.    Lipid Panel    Component Value Date/Time   CHOL 147 10/31/2014 0241   TRIG 38 10/31/2014 0241   HDL 52 10/31/2014 0241   CHOLHDL 2.8 10/31/2014 0241   VLDL 8 10/31/2014 0241   LDLCALC 87 10/31/2014 0241      Wt Readings from Last 3 Encounters:  07/11/21 178 lb (80.7 kg)  05/21/21 178 lb 12.8 oz (81.1 kg)  12/16/20 184 lb 11.2 oz (83.8 kg)      Other studies Reviewed: Additional studies/ records that were reviewed today include: Nond Review of the above records demonstrates:  Please see elsewhere in the note.     ASSESSMENT AND PLAN:   CARDIOMYOPATHY:    I would not suspect this to be reduced.  It was back to 65% few years ago.  No change in therapy.  No further evaluation.  HTN:  The blood pressure is controlled although it is a little bit labile.  She will continue with the amlodipine.  AORTIC VALVE DISEASE: She had some very mild structural heart disease years ago.  I would not suspect this to be different clinically.  No change in therapy.   BRADYCARDIA: She is having no further arrhythmia.  No change in therapy.  Current medicines are reviewed at length with the patient today.  The patient does not have concerns regarding medicines.  The following changes have been made:    Labs/ tests ordered today include: None  Orders Placed This Encounter  Procedures   EKG 12-Lead      Disposition:   FU with me in 12 months.   Signed, Minus Breeding, MD  07/11/2021 1:10 PM    [facility]

## 2021-07-11 ENCOUNTER — Encounter: Payer: Self-pay | Admitting: Cardiology

## 2021-07-11 ENCOUNTER — Other Ambulatory Visit: Payer: Self-pay

## 2021-07-11 ENCOUNTER — Ambulatory Visit: Payer: Medicare PPO | Admitting: Cardiology

## 2021-07-11 VITALS — BP 140/60 | HR 60 | Ht 65.0 in | Wt 178.0 lb

## 2021-07-11 DIAGNOSIS — R42 Dizziness and giddiness: Secondary | ICD-10-CM | POA: Diagnosis not present

## 2021-07-11 DIAGNOSIS — I10 Essential (primary) hypertension: Secondary | ICD-10-CM

## 2021-07-11 DIAGNOSIS — I359 Nonrheumatic aortic valve disorder, unspecified: Secondary | ICD-10-CM

## 2021-07-11 NOTE — Patient Instructions (Signed)
Medication Instructions:  °Your Physician recommend you continue on your current medication as directed.   ° °*If you need a refill on your cardiac medications before your next appointment, please call your pharmacy* ° °Follow-Up: °At [facility], you and your health needs are our priority.  As part of our continuing mission to provide you with exceptional heart care, we have created designated Provider Care Teams.  These Care Teams include your primary Cardiologist (physician) and Advanced Practice Providers (APPs -  Physician Assistants and Nurse Practitioners) who all work together to provide you with the care you need, when you need it. ° °We recommend signing up for the patient portal called "MyChart".  Sign up information is provided on this After Visit Summary.  MyChart is used to connect with patients for Virtual Visits (Telemedicine).  Patients are able to view lab/test results, encounter notes, upcoming appointments, etc.  Non-urgent messages can be sent to your provider as well.   °To learn more about what you can do with MyChart, go to [email or web address].   ° °Your next appointment:   °12 month(s) ° °The format for your next appointment:   °In Person ° °Provider:   °James Hochrein, MD  ° ° ° °

## 2021-07-21 DIAGNOSIS — L280 Lichen simplex chronicus: Secondary | ICD-10-CM | POA: Diagnosis not present

## 2021-07-21 DIAGNOSIS — D485 Neoplasm of uncertain behavior of skin: Secondary | ICD-10-CM | POA: Diagnosis not present

## 2021-07-21 DIAGNOSIS — L821 Other seborrheic keratosis: Secondary | ICD-10-CM | POA: Diagnosis not present

## 2021-07-21 DIAGNOSIS — D225 Melanocytic nevi of trunk: Secondary | ICD-10-CM | POA: Diagnosis not present

## 2021-07-21 DIAGNOSIS — D2271 Melanocytic nevi of right lower limb, including hip: Secondary | ICD-10-CM | POA: Diagnosis not present

## 2021-07-21 DIAGNOSIS — D2261 Melanocytic nevi of right upper limb, including shoulder: Secondary | ICD-10-CM | POA: Diagnosis not present

## 2021-07-21 DIAGNOSIS — D0472 Carcinoma in situ of skin of left lower limb, including hip: Secondary | ICD-10-CM | POA: Diagnosis not present

## 2021-07-21 DIAGNOSIS — D2262 Melanocytic nevi of left upper limb, including shoulder: Secondary | ICD-10-CM | POA: Diagnosis not present

## 2021-07-21 DIAGNOSIS — D2272 Melanocytic nevi of left lower limb, including hip: Secondary | ICD-10-CM | POA: Diagnosis not present

## 2021-07-21 DIAGNOSIS — L814 Other melanin hyperpigmentation: Secondary | ICD-10-CM | POA: Diagnosis not present

## 2021-08-05 ENCOUNTER — Ambulatory Visit (HOSPITAL_BASED_OUTPATIENT_CLINIC_OR_DEPARTMENT_OTHER): Payer: Medicare PPO | Attending: Neurology | Admitting: Physical Therapy

## 2021-08-05 ENCOUNTER — Other Ambulatory Visit: Payer: Self-pay

## 2021-08-05 ENCOUNTER — Encounter (HOSPITAL_BASED_OUTPATIENT_CLINIC_OR_DEPARTMENT_OTHER): Payer: Self-pay | Admitting: Physical Therapy

## 2021-08-05 DIAGNOSIS — R262 Difficulty in walking, not elsewhere classified: Secondary | ICD-10-CM

## 2021-08-05 DIAGNOSIS — R269 Unspecified abnormalities of gait and mobility: Secondary | ICD-10-CM | POA: Diagnosis not present

## 2021-08-05 DIAGNOSIS — M25562 Pain in left knee: Secondary | ICD-10-CM | POA: Diagnosis not present

## 2021-08-05 DIAGNOSIS — R2681 Unsteadiness on feet: Secondary | ICD-10-CM | POA: Insufficient documentation

## 2021-08-05 DIAGNOSIS — M6281 Muscle weakness (generalized): Secondary | ICD-10-CM | POA: Diagnosis not present

## 2021-08-05 DIAGNOSIS — G8929 Other chronic pain: Secondary | ICD-10-CM | POA: Insufficient documentation

## 2021-08-05 DIAGNOSIS — D0472 Carcinoma in situ of skin of left lower limb, including hip: Secondary | ICD-10-CM | POA: Diagnosis not present

## 2021-08-05 DIAGNOSIS — M25561 Pain in right knee: Secondary | ICD-10-CM | POA: Diagnosis not present

## 2021-08-05 NOTE — Therapy (Signed)
OUTPATIENT PHYSICAL THERAPY THORACOLUMBAR EVALUATION   Patient Name: Terri Ortega MRN: [id number] DOB:[birth year], 81 y.o., female Today's Date: 08/05/2021   PT End of Session - 08/05/21 1106     Visit Number 1    Number of Visits 21    Date for PT Re-Evaluation 11/03/21    Authorization Type [employer] MCR    PT Start Time 1100    PT Stop Time 1145    PT Time Calculation (min) 45 min    Activity Tolerance Patient tolerated treatment well    Behavior During Therapy [facility] for tasks assessed/performed             Past Medical History:  Diagnosis Date   Graves disease    HTN (hypertension)    Low back pain    Dr. Ronnald Ramp   Osteoporosis    Pneumonia 2012   Radiation 03/20/14, 03/27/14, 04/05/14, 04/12/14, 04/17/14   bracytherapy to proximal vagina 30 gray   Scoliosis    Uterine cancer ([place])    surgery and radiation x 5 treatments-last 9'15   Vitamin D deficiency    Past Surgical History:  Procedure Laterality Date   BUNIONECTOMY     left   CATARACT EXTRACTION, BILATERAL Bilateral    last done 11-21-14   COLONOSCOPY WITH PROPOFOL N/A 12/25/2014   Procedure: COLONOSCOPY WITH PROPOFOL;  Surgeon: Garlan Fair, MD;  Location: [facility];  Service: Endoscopy;  Laterality: N/A;   FOOT SURGERY Left 2004   Dr Johnnye Sima   JOINT REPLACEMENT     RTKA   ROBOTIC ASSISTED TOTAL HYSTERECTOMY WITH BILATERAL SALPINGO OOPHERECTOMY  01/30/14   with lymph node biopsy   TOTAL KNEE ARTHROPLASTY     right   TOTAL KNEE REVISION Right 01/04/2019   Procedure: TOTAL KNEE REVISION;  Surgeon: Gaynelle Arabian, MD;  Location: [facility];  Service: Orthopedics;  Laterality: Right;  141min with block   Patient Active Problem List   Diagnosis Date Noted   Aortic valve disease 07/10/2021   Dilated cardiomyopathy ([place]) 12/14/2020   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   Orthostatic dizziness 11/14/2020   Educated about COVID-19 virus infection 12/21/2019   Cognitive change 09/06/2019    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   Preop cardiovascular exam 12/20/2018   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   Uterine cancer ([place]) 03/28/2018   Arthralgia of hip or thigh 03/28/2018   Benign essential HTN 03/28/2018   Overweight 03/28/2018   Heart disease 11/09/2014   Leukocytosis 35/00/9381   Diastolic congestive heart failure ([place]) 10/30/2014   Hypothyroidism 10/30/2014   Chest pain at rest 10/30/2014   Chest pain 10/30/2014   Endometrial cancer ([place]) 01/30/2014   Dyspnea 01/11/2014   Edema 01/11/2014   Low back pain with right-sided sciatica 12/13/2013   Degeneration of lumbar or lumbosacral intervertebral disc 12/13/2013   Sciatica 12/13/2013    PCP: Lawerance Cruel, MD  REFERRING PROVIDER: Melvenia Beam, MD  REFERRING DIAG:  R26.9 (ICD-10-CM) - Gait abnormality  R53.1 (ICD-10-CM) - Weakness  M25.561,M25.562,G89.29 (ICD-10-CM) - Chronic pain of both knees    THERAPY DIAG:  Unsteadiness on feet  Muscle weakness (generalized)  Difficulty walking  ONSET DATE: 2020  SUBJECTIVE:  SUBJECTIVE STATEMENT: Pt states her previous bout of vertigo has resolved since last PT episode. She feels like now she is unsure of herself while walking. It is more distance related than uneven surfaces. Pt feels like she is wobbly or might fall while walking. She has no fear of walking while at home but more when she is away from home- at daughter's house for example. She has very little dizziness that is intermittent at best. She feels that when walking from the front door to the mailbox she always has to take the cane. She rarely takes a cane to church and will hold husband's hand or arm. She will hold onto things while  walking in order to steady herself. Pt states she feels her legs are weak and can't stand or walk for extended periods. Pt feels the balance issues started after the 2nd knee surgery. She did the elliptical at the gym for strength and did do PT but did not feel like she fully returned to normal. Pt denies NT or neuropathy in the LE.   PERTINENT HISTORY:  TKA revision in 2020; L sided LBP; HTN; Graves Disease  PAIN:  Are you having pain? No  Pain location: L/S or L LE when it does occur Aggravating factors: sleeping on the wrong side, standing too long Relieving factors: resting  PRECAUTIONS: None  WEIGHT BEARING RESTRICTIONS No  FALLS:  Has patient fallen in last 6 months? No, Number of falls: 0  LIVING ENVIRONMENT: Lives with: lives with their spouse Lives in: House/apartment Stairs: Yes; 4 steps with railing in front of home Has following equipment at home: Single point cane  OCCUPATION: retired  PLOF: Independent with basic ADLs  PATIENT GOALS : Pt states she would like to be able to walk better and play with great grandchildren more.    OBJECTIVE  DIAGNOSTIC FINDINGS:  IMPRESSION: No evidence of acute intracranial abnormality.   Mild-to-moderate generalized atrophy of the brain.   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   Mild paranasal sinus disease as described.  PATIENT SURVEYS:  FOTO unavailable  ABC Scale: 850 / 1600 = 53.1 %  MUSCULOSKELETAL: Tremor: Absent Tone: Normal  Posture No gross abnormalities noted in standing or seated posture; mild kyphosis and rounded shoulders, toe out position  Gait Distance walked: 19ft Assistive device utilized: Single point cane Level of assistance: Modified independence Comments: widened BOS; shortened step length  Strength R/L 4-/4 Hip flexion 4-/4+ Hip abduction 4+/+ Hip adduction   26 lbs/28lbs Knee extension 5/5 Knee flexion 5/5 Ankle Plantarflexion 5/5 Ankle  Dorsiflexion   NEUROLOGICAL:  Mental Status Patient is oriented to person, place and time.      Sensation Grossly intact to light touch bilateral UEs/Les hypersensitivity bilateral LE   Coordination/Cerebellar Finger to Nose: WNL Rapid alternating movements: WNL    FUNCTIONAL OUTCOME MEASURES   Results Comments  TUG 15 seconds Near LOB with L handed turn  5TSTS 20 seconds     TODAY'S TREATMENT   Exercises Sit to Stand with Resistance Around Legs - 1 x daily - 7 x weekly - 2 sets - 10 reps Seated Hip Abduction with Resistance - 1 x daily - 7 x weekly - 3 sets - 10 reps   PATIENT EDUCATION:  Education details: MOI, diagnosis, prognosis, anatomy, exercise progression, DOMS expectations, muscle firing,  envelope of function, HEP, POC  Person educated: Patient and Spouse Education method: Explanation, Demonstration, Tactile cues, Verbal cues, and Handouts Education comprehension: verbalized understanding,  returned demonstration, verbal cues required, tactile cues required, and needs further education  HOME EXERCISE PROGRAM: Access Code: [email or web address] URL: [email or web address] Date: 08/05/2021 Prepared by: Daleen Bo  ASSESSMENT:  CLINICAL IMPRESSION: Patient is a 81 y.o. female who was seen today for physical therapy evaluation and treatment for cc of balance and gait deficits. At present, pt presents as a falls risk due to LE strength deficits, decrease righting reactions, and MD note report of decreased safety awareness. Pt's history of L sided LBP does appear to affecting L proximal hip strength suggesting potential radicular involvement. Objective impairments include Abnormal gait, decreased activity tolerance, decreased balance, decreased cognition, decreased coordination, decreased endurance, decreased mobility, difficulty walking, decreased ROM, decreased strength, dizziness, impaired flexibility, improper body mechanics, postural dysfunction, and pain.  These impairments are limiting patient from cleaning, community activity, driving, laundry, yard work, shopping, and exercise . Personal factors including Age, Behavior pattern, Fitness, Time since onset of injury/illness/exacerbation, and 1-2 comorbidities:    are also affecting patient's functional outcome. Patient will benefit from skilled PT to address above impairments and improve overall function.  REHAB POTENTIAL: Fair    CLINICAL DECISION MAKING: Evolving/moderate complexity  EVALUATION COMPLEXITY: Moderate   GOALS:   SHORT TERM GOALS:  STG Name Target Date Goal status  1 Pt will become independent with HEP in order to demonstrate synthesis of PT education.  Baseline:  09/16/2021 INITIAL  2 Pt will be able to demonstrate STS from chair without UE in order to demonstrate functional improvement in LE function for self-care and house hold duties.  09/16/2021 INITIAL   LONG TERM GOALS:   LTG Name Target Date Goal status  1 Pt  will become independent with final HEP in order to demonstrate synthesis of PT education.  Baseline: 10/28/2021 INITIAL  2 Pt will be able to perform 5XSTS in under 12s  in order to demonstrate functional improvement above the cut off score for adults.  Baseline: 10/28/2021 INITIAL  3 Pt will be able to demonstrate TUG in under 10 sec in order to demonstrate functional improvement in LE function, strength, balance, and mobility for safety with community ambulation.  Baseline: 10/28/2021 INITIAL  4 Pt will be able to demonstrate ability to ambulate with even stride length and shoulder/hip width BOS without LOB in order to demonstrate functional improvement in balance and gait for self-care and house hold duties.  Baseline: 10/28/2021 INITIAL   PLAN: PT FREQUENCY: 1-2x/week  PT DURATION: 12 weeks  PLANNED INTERVENTIONS: Therapeutic exercises, Therapeutic activity, Neuro Muscular re-education, Balance training, Gait training, Patient/Family education, Joint  mobilization, Stair training, Vestibular training, Aquatic Therapy, Dry Needling, Electrical stimulation, Spinal mobilization, Cryotherapy, Moist heat, scar mobilization, Taping, Vasopneumatic device, Traction, Ultrasound, Ionotophoresis 4mg /ml Dexamethasone, and Manual therapy  PLAN FOR NEXT SESSION: aquatics intro; work on balance; R hip strength and stability  Daleen Bo PT, DPT 08/05/21 11:52 AM   Referring diagnosis?  R26.9 (ICD-10-CM) - Gait abnormality  R53.1 (ICD-10-CM) - Weakness  M25.561,M25.562,G89.29 (ICD-10-CM) - Chronic pain of both knees   Treatment diagnosis? (if different than referring diagnosis) r26.81, m62.81, r26.2 What was this (referring dx) caused by? []  Surgery []  Fall []  Ongoing issue []  Arthritis [x]  Other: Balance  Laterality: []  Rt []  Lt [x]  Both  Check all possible CPT codes:      []  97110 (Therapeutic Exercise)  []  92507 (SLP Treatment)  []  97112 (Neuro Re-ed)   []  92526 (Swallowing Treatment)   []  40981 (Gait Training)   []  [phone] (Cognitive Training, 1st 15  minutes) []  97140 (Manual Therapy)   []  97130 (Cognitive Training, each add'l 15 minutes)  []  97530 (Therapeutic Activities)  []  Other, List CPT Code ____________    []  36725 (Self Care)       [x]  All codes above (97110 - 97535)  []  97012 (Mechanical Traction)  [x]  97014 (E-stim Unattended)  [x]  97032 (E-stim manual)  []  97033 (Ionto)  [x]  97035 (Ultrasound)  [x]  97760 (Orthotic Fit) []  [id number] (Physical Performance Training) [x]  [id number] (Aquatic Therapy) []  97034 (Contrast Bath) []  [id number] (Paraffin) []  97597 (Wound Care 1st 20 sq cm) []  97598 (Wound Care each add'l 20 sq cm) []  97016 (Vasopneumatic Device) []  [id number] Comptroller) []  [id number] (Prosthetic Training)

## 2021-08-11 ENCOUNTER — Encounter (HOSPITAL_BASED_OUTPATIENT_CLINIC_OR_DEPARTMENT_OTHER): Payer: Self-pay | Admitting: Physical Therapy

## 2021-08-11 ENCOUNTER — Ambulatory Visit (HOSPITAL_BASED_OUTPATIENT_CLINIC_OR_DEPARTMENT_OTHER): Payer: Medicare PPO | Admitting: Physical Therapy

## 2021-08-11 ENCOUNTER — Other Ambulatory Visit: Payer: Self-pay

## 2021-08-11 DIAGNOSIS — R2681 Unsteadiness on feet: Secondary | ICD-10-CM | POA: Diagnosis not present

## 2021-08-11 DIAGNOSIS — R262 Difficulty in walking, not elsewhere classified: Secondary | ICD-10-CM

## 2021-08-11 DIAGNOSIS — G8929 Other chronic pain: Secondary | ICD-10-CM | POA: Diagnosis not present

## 2021-08-11 DIAGNOSIS — M25562 Pain in left knee: Secondary | ICD-10-CM | POA: Diagnosis not present

## 2021-08-11 DIAGNOSIS — M6281 Muscle weakness (generalized): Secondary | ICD-10-CM

## 2021-08-11 DIAGNOSIS — M25561 Pain in right knee: Secondary | ICD-10-CM | POA: Diagnosis not present

## 2021-08-11 DIAGNOSIS — R269 Unspecified abnormalities of gait and mobility: Secondary | ICD-10-CM | POA: Diagnosis not present

## 2021-08-11 NOTE — Therapy (Signed)
OUTPATIENT PHYSICAL THERAPY TREATMENT NOTE   Patient Name: Terri Ortega MRN: [id number] DOB:1941-05-08, 81 y.o., female Today's Date: 08/11/2021  PCP: Lawerance Cruel, MD REFERRING PROVIDER: Lawerance Cruel, MD   PT End of Session - 08/11/21 1121     Visit Number 2    Number of Visits 21    Date for PT Re-Evaluation 11/03/21    Authorization Type [employer] MCR    PT Start Time 9509    PT Stop Time 1120    PT Time Calculation (min) 35 min             Past Medical History:  Diagnosis Date   Graves disease    HTN (hypertension)    Low back pain    Dr. Ronnald Ramp   Osteoporosis    Pneumonia 2012   Radiation 03/20/14, 03/27/14, 04/05/14, 04/12/14, 04/17/14   bracytherapy to proximal vagina 30 gray   Scoliosis    Uterine cancer ([place])    surgery and radiation x 5 treatments-last 9'15   Vitamin D deficiency    Past Surgical History:  Procedure Laterality Date   BUNIONECTOMY     left   CATARACT EXTRACTION, BILATERAL Bilateral    last done 11-21-14   COLONOSCOPY WITH PROPOFOL N/A 12/25/2014   Procedure: COLONOSCOPY WITH PROPOFOL;  Surgeon: Garlan Fair, MD;  [facility];  Service: Endoscopy;  Laterality: N/A;   FOOT SURGERY Left 2004   Dr Johnnye Sima   JOINT REPLACEMENT     RTKA   ROBOTIC ASSISTED TOTAL HYSTERECTOMY WITH BILATERAL SALPINGO OOPHERECTOMY  01/30/14   with lymph node biopsy   TOTAL KNEE ARTHROPLASTY     right   TOTAL KNEE REVISION Right 01/04/2019   Procedure: TOTAL KNEE REVISION;  Surgeon: Gaynelle Arabian, MD;  Location: [facility];  Service: Orthopedics;  Laterality: Right;  145min with block   Patient Active Problem List   Diagnosis Date Noted   Aortic valve disease 07/10/2021   Dilated cardiomyopathy ([place]) 12/14/2020   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   Orthostatic dizziness 11/14/2020   Educated about COVID-19 virus infection 12/21/2019   Cognitive change 09/06/2019   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   Preop cardiovascular exam 12/20/2018   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   Uterine cancer ([place]) 03/28/2018   Arthralgia of hip or thigh 03/28/2018   Benign essential HTN 03/28/2018   Overweight 03/28/2018   Heart disease 11/09/2014   Leukocytosis 32/67/1245   Diastolic congestive heart failure ([place]) 10/30/2014   Hypothyroidism 10/30/2014   Chest pain at rest 10/30/2014   Chest pain 10/30/2014   Endometrial cancer ([place]) 01/30/2014   Dyspnea 01/11/2014   Edema 01/11/2014   Low back pain with right-sided sciatica 12/13/2013   Degeneration of lumbar or lumbosacral intervertebral disc 12/13/2013   Sciatica 12/13/2013    REFERRING DIAG:  R26.9 (ICD-10-CM) - Gait abnormality  R53.1 (ICD-10-CM) - Weakness  M25.561,M25.562,G89.29 (ICD-10-CM) - Chronic pain of both knees    THERAPY DIAG:  Unsteadiness on feet Muscle weakness (generalized)   Difficulty walking  PERTINENT HISTORY: TKA revision in 2020; L sided LBP; HTN; Graves Disease  PRECAUTIONS: None  SUBJECTIVE: Pt late for session , said she has been dreading coming  PAIN:  Are you having pain? No   Pain location: L/S or L LE when it does occur Aggravating factors: sleeping on the wrong side, standing too long Relieving factors: resting      OBJECTIVE  DIAGNOSTIC FINDINGS:  IMPRESSION: No evidence of acute intracranial abnormality.   Mild-to-moderate generalized atrophy of the brain.   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   Mild paranasal sinus disease as described.   PATIENT SURVEYS:  FOTO unavailable   ABC Scale: 850 / 1600 = 53.1 %   MUSCULOSKELETAL: Tremor: Absent Tone: Normal   Posture No gross abnormalities noted in standing or seated posture; mild kyphosis and rounded shoulders, toe out position   Gait Distance walked: 71ft Assistive device utilized: Single point cane Level of assistance:  Modified independence Comments: widened BOS; shortened step length   Strength R/L 4-/4 Hip flexion 4-/4+ Hip abduction 4+/+ Hip adduction    26 lbs/28lbs Knee extension 5/5 Knee flexion 5/5 Ankle Plantarflexion 5/5 Ankle Dorsiflexion     NEUROLOGICAL:   Mental Status Patient is oriented to person, place and time.      Sensation Grossly intact to light touch bilateral UEs/Les hypersensitivity bilateral LE    Coordination/Cerebellar Finger to Nose: WNL Rapid alternating movements: WNL       FUNCTIONAL OUTCOME MEASURES       Results Comments  TUG 15 seconds Near LOB with L handed turn  5TSTS 20 seconds        TODAY'S TREATMENT    Pt seen for aquatic therapy today.  Treatment took place in water 3.25-4.8 ft in depth at the [facility] pool. Temp of water was 91.  Pt entered/exited the pool via stairs step to pattern and cgaindependently with bilat rail.  Intro to setting Walking using yellow noodle x 15 minutes gait training, decreasing fear of falling, educating pt on water properties and benefits of aquatic therapy.  Fwd, bwd and side stepping  STS from bench Flutter kicking at hip seated on 3rd water step Add/abd 3x10  Pt requires buoyancy for support and to offload joints with strengthening exercises. Viscosity of the water is needed for resistance of strengthening; water current perturbations provides challenge to standing balance unsupported, requiring increased core activation.     PATIENT EDUCATION:  Education details: MOI, diagnosis, prognosis, anatomy, exercise progression, DOMS expectations, muscle firing,  envelope of function, HEP, POC   Person educated: Patient and Spouse Education method: Explanation, Demonstration, Tactile cues, Verbal cues, and Handouts Education comprehension: verbalized understanding, returned demonstration, verbal cues required, tactile cues required, and needs further education 08/11/21 properties of water;  benefits of aquatic therapy   HOME EXERCISE PROGRAM: Access Code: [email or web address] URL: [email or web address] Date: 08/05/2021 Prepared by: Daleen Bo   ASSESSMENT:   CLINICAL IMPRESSION: Pt introduced to setting. She is apprehensive and need Hha/close SBA throughout session. Focus today on decreasing fear of falling in setting/gaining pt's confidence in her ability and gait training. With cues pt demonstrates improved gait with heel strike and knee flex. Initially pt with little to no knee flex, scuffing heels. Confidence in setting gradually improves walking in all direction and turning. Pt gaining benefits from hydrostatic pressure to improve circulation and decrease le edema as well as strengthening from amb through water. She reports no pain. Pt needs assistance once exiting pool to ensure safety with amb.  She is a good candidate for aquatics to facilitate and hasten meeting of goals.  Patient is a 81 y.o. female who was seen today for physical therapy evaluation and treatment for cc of balance and gait deficits. At present, pt presents as a falls risk due to LE strength deficits, decrease righting reactions, and MD note report of decreased safety awareness. Pt's  history of L sided LBP does appear to affecting L proximal hip strength suggesting potential radicular involvement. Objective impairments include Abnormal gait, decreased activity tolerance, decreased balance, decreased cognition, decreased coordination, decreased endurance, decreased mobility, difficulty walking, decreased ROM, decreased strength, dizziness, impaired flexibility, improper body mechanics, postural dysfunction, and pain. These impairments are limiting patient from cleaning, community activity, driving, laundry, yard work, shopping, and exercise . Personal factors including Age, Behavior pattern, Fitness, Time since onset of injury/illness/exacerbation, and 1-2 comorbidities:    are also affecting patient's functional  outcome. Patient will benefit from skilled PT to address above impairments and improve overall function.   REHAB POTENTIAL: Fair     CLINICAL DECISION MAKING: Evolving/moderate complexity   EVALUATION COMPLEXITY: Moderate     GOALS:     SHORT TERM GOALS:   STG Name Target Date Goal status  1 Pt will become independent with HEP in order to demonstrate synthesis of PT education.   Baseline:  09/16/2021 INITIAL  2 Pt will be able to demonstrate STS from chair without UE in order to demonstrate functional improvement in LE function for self-care and house hold duties.   09/16/2021 INITIAL    LONG TERM GOALS:    LTG Name Target Date Goal status  1 Pt  will become independent with final HEP in order to demonstrate synthesis of PT education.   Baseline: 10/28/2021 INITIAL  2 Pt will be able to perform 5XSTS in under 12s  in order to demonstrate functional improvement above the cut off score for adults.  Baseline: 10/28/2021 INITIAL  3 Pt will be able to demonstrate TUG in under 10 sec in order to demonstrate functional improvement in LE function, strength, balance, and mobility for safety with community ambulation.   Baseline: 10/28/2021 INITIAL  4 Pt will be able to demonstrate ability to ambulate with even stride length and shoulder/hip width BOS without LOB in order to demonstrate functional improvement in balance and gait for self-care and house hold duties.   Baseline: 10/28/2021 INITIAL    PLAN: PT FREQUENCY: 1-2x/week   PT DURATION: 12 weeks   PLANNED INTERVENTIONS: Therapeutic exercises, Therapeutic activity, Neuro Muscular re-education, Balance training, Gait training, Patient/Family education, Joint mobilization, Stair training, Vestibular training, Aquatic Therapy, Dry Needling, Electrical stimulation, Spinal mobilization, Cryotherapy, Moist heat, scar mobilization, Taping, Vasopneumatic device, Traction, Ultrasound, Ionotophoresis 4mg /ml Dexamethasone, and Manual therapy    PLAN FOR NEXT SESSION: work on balance; R hip strength and stability; add aquatic HEP when appropriate    Vedia Pereyra MPT 08/11/2021, 1:51 PM

## 2021-08-18 ENCOUNTER — Ambulatory Visit (HOSPITAL_BASED_OUTPATIENT_CLINIC_OR_DEPARTMENT_OTHER): Payer: Medicare PPO | Admitting: Physical Therapy

## 2021-08-18 ENCOUNTER — Encounter (HOSPITAL_BASED_OUTPATIENT_CLINIC_OR_DEPARTMENT_OTHER): Payer: Self-pay | Admitting: Physical Therapy

## 2021-08-18 ENCOUNTER — Other Ambulatory Visit: Payer: Self-pay

## 2021-08-18 DIAGNOSIS — M25561 Pain in right knee: Secondary | ICD-10-CM | POA: Diagnosis not present

## 2021-08-18 DIAGNOSIS — M25562 Pain in left knee: Secondary | ICD-10-CM | POA: Diagnosis not present

## 2021-08-18 DIAGNOSIS — M6281 Muscle weakness (generalized): Secondary | ICD-10-CM

## 2021-08-18 DIAGNOSIS — R2681 Unsteadiness on feet: Secondary | ICD-10-CM

## 2021-08-18 DIAGNOSIS — G8929 Other chronic pain: Secondary | ICD-10-CM | POA: Diagnosis not present

## 2021-08-18 DIAGNOSIS — R262 Difficulty in walking, not elsewhere classified: Secondary | ICD-10-CM

## 2021-08-18 DIAGNOSIS — R269 Unspecified abnormalities of gait and mobility: Secondary | ICD-10-CM | POA: Diagnosis not present

## 2021-08-18 NOTE — Therapy (Signed)
OUTPATIENT PHYSICAL THERAPY TREATMENT NOTE   Patient Name: Terri Ortega MRN: [id number] DOB:1940/08/10, 81 y.o., female Today's Date: 08/18/2021  PCP: Lawerance Cruel, MD REFERRING PROVIDER: Lawerance Cruel, MD   PT End of Session - 08/18/21 1034     Visit Number 3    Number of Visits 21    Date for PT Re-Evaluation 11/03/21    Authorization Type [employer] MCR    PT Start Time 1033    PT Stop Time 1118    PT Time Calculation (min) 45 min             Past Medical History:  Diagnosis Date   Graves disease    HTN (hypertension)    Low back pain    Dr. Ronnald Ramp   Osteoporosis    Pneumonia 2012   Radiation 03/20/14, 03/27/14, 04/05/14, 04/12/14, 04/17/14   bracytherapy to proximal vagina 30 gray   Scoliosis    Uterine cancer ([place])    surgery and radiation x 5 treatments-last 9'15   Vitamin D deficiency    Past Surgical History:  Procedure Laterality Date   BUNIONECTOMY     left   CATARACT EXTRACTION, BILATERAL Bilateral    last done 11-21-14   COLONOSCOPY WITH PROPOFOL N/A 12/25/2014   Procedure: COLONOSCOPY WITH PROPOFOL;  Surgeon: Garlan Fair, MD;  Location: [facility];  Service: Endoscopy;  Laterality: N/A;   FOOT SURGERY Left 2004   Dr Johnnye Sima   JOINT REPLACEMENT     RTKA   ROBOTIC ASSISTED TOTAL HYSTERECTOMY WITH BILATERAL SALPINGO OOPHERECTOMY  01/30/14   with lymph node biopsy   TOTAL KNEE ARTHROPLASTY     right   TOTAL KNEE REVISION Right 01/04/2019   Procedure: TOTAL KNEE REVISION;  Surgeon: Gaynelle Arabian, MD;  Location: [facility];  Service: Orthopedics;  Laterality: Right;  121min with block   Patient Active Problem List   Diagnosis Date Noted   Aortic valve disease 07/10/2021   Dilated cardiomyopathy (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   Preop cardiovascular exam 12/20/2018   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   Uterine cancer ([place]) 03/28/2018   Arthralgia of hip or thigh 03/28/2018   Benign essential HTN 03/28/2018   Overweight 03/28/2018   Heart disease 11/09/2014   Leukocytosis 44/96/7591   Diastolic congestive heart failure ([place]) 10/30/2014   Hypothyroidism 10/30/2014   Chest pain at rest 10/30/2014   Chest pain 10/30/2014   Endometrial cancer ([place]) 01/30/2014   Dyspnea 01/11/2014   Edema 01/11/2014   Low back pain with right-sided sciatica 12/13/2013   Degeneration of lumbar or lumbosacral intervertebral disc 12/13/2013   Sciatica 12/13/2013    REFERRING DIAG:  R26.9 (ICD-10-CM) - Gait abnormality  R53.1 (ICD-10-CM) - Weakness  M25.561,M25.562,G89.29 (ICD-10-CM) - Chronic pain of both knees    THERAPY DIAG:  Unsteadiness on feet Muscle weakness (generalized)   Difficulty walking  PERTINENT HISTORY: TKA revision in 2020; L sided LBP; HTN; Graves Disease  PRECAUTIONS: None  SUBJECTIVE: Pt reports no adverse reaction to last visit  PAIN:  Are you having pain? No   Pain location: L/S or L LE when it does occur Aggravating factors: sleeping on the wrong side, standing too long Relieving factors: resting      OBJECTIVE   DIAGNOSTIC  FINDINGS:  IMPRESSION: No evidence of acute intracranial abnormality.   Mild-to-moderate generalized atrophy of the brain.   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   Mild paranasal sinus disease as described.   PATIENT SURVEYS:  FOTO unavailable   ABC Scale: 850 / 1600 = 53.1 %   MUSCULOSKELETAL: Tremor: Absent Tone: Normal   Posture No gross abnormalities noted in standing or seated posture; mild kyphosis and rounded shoulders, toe out position   Gait Distance walked: 21ft Assistive device utilized: Single point cane Level of assistance: Modified  independence Comments: widened BOS; shortened step length   Strength R/L 4-/4 Hip flexion 4-/4+ Hip abduction 4+/+ Hip adduction    26 lbs/28lbs Knee extension 5/5 Knee flexion 5/5 Ankle Plantarflexion 5/5 Ankle Dorsiflexion     NEUROLOGICAL:   Mental Status Patient is oriented to person, place and time.      Sensation Grossly intact to light touch bilateral UEs/Les hypersensitivity bilateral LE    Coordination/Cerebellar Finger to Nose: WNL Rapid alternating movements: WNL       FUNCTIONAL OUTCOME MEASURES       Results Comments  TUG 15 seconds Near LOB with L handed turn  5TSTS 20 seconds        TODAY'S TREATMENT    Pt seen for aquatic therapy today.  Treatment took place in water 3.25-4.8 ft in depth at the [facility] pool. Temp of water was 91.  Pt entered/exited the pool via stairs step to pattern and cgaindependently with bilat rail.  Reviewed current function, pain levels, response to prior Rx, and HEP compliance.    Walking using yellow noodle Fwd, bwd and side stepping 4 widths each.  Cues for improved gait  Cycling 3x20 seated on 3rd water step 3x20 Flutter kicking at hip  Add/abd 3x20   Step ups leading R/L forward x10 with decreasing ue support on handrails to HHA for improved muscles engaement, weight shifting ability and balance Side stepping R/L x10 SL squat rle x10  Walking supervised 2 widths forward and back for warm down   Pt requires buoyancy for support and to offload joints with strengthening exercises. Viscosity of the water is needed for resistance of strengthening; water current perturbations provides challenge to standing balance unsupported, requiring increased core activation.     PATIENT EDUCATION:  Education details: MOI, diagnosis, prognosis, anatomy, exercise progression, DOMS expectations, muscle firing,  envelope of function, HEP, POC   Person educated: Patient and Spouse Education method: Explanation,  Demonstration, Tactile cues, Verbal cues, and Handouts Education comprehension: verbalized understanding, returned demonstration, verbal cues required, tactile cues required, and needs further education 08/11/21 properties of water; benefits of aquatic therapy   HOME EXERCISE PROGRAM: Access Code: [email or web address] URL: [email or web address] Date: 08/05/2021 Prepared by: Daleen Bo   ASSESSMENT:   CLINICAL IMPRESSION: Pt reports no adverse reaction t last visi.  No pain just tiredness. She continues to demonstrate improved comfort in setting. She reports feeling fine after last treatment.  Added step ups with cues for weight shifting and weight bearing for strength and balance training.  She requires assistance for improved execution to decrease fear and apprehensiveness of LE ability.     Patient is a 81 y.o. female who was seen today for physical therapy evaluation and treatment for cc of balance and gait deficits. At present, pt presents as a falls risk due to LE strength deficits, decrease righting reactions, and MD note report of decreased safety awareness. Pt's history of L sided LBP does  appear to affecting L proximal hip strength suggesting potential radicular involvement. Objective impairments include Abnormal gait, decreased activity tolerance, decreased balance, decreased cognition, decreased coordination, decreased endurance, decreased mobility, difficulty walking, decreased ROM, decreased strength, dizziness, impaired flexibility, improper body mechanics, postural dysfunction, and pain. These impairments are limiting patient from cleaning, community activity, driving, laundry, yard work, shopping, and exercise . Personal factors including Age, Behavior pattern, Fitness, Time since onset of injury/illness/exacerbation, and 1-2 comorbidities:    are also affecting patient's functional outcome. Patient will benefit from skilled PT to address above impairments and improve overall  function.   REHAB POTENTIAL: Fair     CLINICAL DECISION MAKING: Evolving/moderate complexity   EVALUATION COMPLEXITY: Moderate     GOALS:     SHORT TERM GOALS:   STG Name Target Date Goal status  1 Pt will become independent with HEP in order to demonstrate synthesis of PT education.   Baseline:  09/16/2021 INITIAL  2 Pt will be able to demonstrate STS from chair without UE in order to demonstrate functional improvement in LE function for self-care and house hold duties.   09/16/2021 INITIAL    LONG TERM GOALS:    LTG Name Target Date Goal status  1 Pt  will become independent with final HEP in order to demonstrate synthesis of PT education.   Baseline: 10/28/2021 INITIAL  2 Pt will be able to perform 5XSTS in under 12s  in order to demonstrate functional improvement above the cut off score for adults.  Baseline: 10/28/2021 INITIAL  3 Pt will be able to demonstrate TUG in under 10 sec in order to demonstrate functional improvement in LE function, strength, balance, and mobility for safety with community ambulation.   Baseline: 10/28/2021 INITIAL  4 Pt will be able to demonstrate ability to ambulate with even stride length and shoulder/hip width BOS without LOB in order to demonstrate functional improvement in balance and gait for self-care and house hold duties.   Baseline: 10/28/2021 INITIAL    PLAN: PT FREQUENCY: 1-2x/week   PT DURATION: 12 weeks   PLANNED INTERVENTIONS: Therapeutic exercises, Therapeutic activity, Neuro Muscular re-education, Balance training, Gait training, Patient/Family education, Joint mobilization, Stair training, Vestibular training, Aquatic Therapy, Dry Needling, Electrical stimulation, Spinal mobilization, Cryotherapy, Moist heat, scar mobilization, Taping, Vasopneumatic device, Traction, Ultrasound, Ionotophoresis 4mg /ml Dexamethasone, and Manual therapy   PLAN FOR NEXT SESSION: work on balance; R hip strength and stability; add aquatic HEP when  appropriate    Vedia Pereyra MPT 08/18/2021, 11:38 AM

## 2021-08-25 ENCOUNTER — Other Ambulatory Visit: Payer: Self-pay

## 2021-08-25 ENCOUNTER — Ambulatory Visit (HOSPITAL_BASED_OUTPATIENT_CLINIC_OR_DEPARTMENT_OTHER): Payer: Medicare PPO | Admitting: Physical Therapy

## 2021-08-25 ENCOUNTER — Encounter (HOSPITAL_BASED_OUTPATIENT_CLINIC_OR_DEPARTMENT_OTHER): Payer: Self-pay | Admitting: Physical Therapy

## 2021-08-25 DIAGNOSIS — M25561 Pain in right knee: Secondary | ICD-10-CM | POA: Diagnosis not present

## 2021-08-25 DIAGNOSIS — R269 Unspecified abnormalities of gait and mobility: Secondary | ICD-10-CM | POA: Diagnosis not present

## 2021-08-25 DIAGNOSIS — R262 Difficulty in walking, not elsewhere classified: Secondary | ICD-10-CM

## 2021-08-25 DIAGNOSIS — M25562 Pain in left knee: Secondary | ICD-10-CM | POA: Diagnosis not present

## 2021-08-25 DIAGNOSIS — G8929 Other chronic pain: Secondary | ICD-10-CM | POA: Diagnosis not present

## 2021-08-25 DIAGNOSIS — M6281 Muscle weakness (generalized): Secondary | ICD-10-CM | POA: Diagnosis not present

## 2021-08-25 DIAGNOSIS — R2681 Unsteadiness on feet: Secondary | ICD-10-CM

## 2021-08-25 NOTE — Therapy (Signed)
OUTPATIENT PHYSICAL THERAPY TREATMENT NOTE   Patient Name: Terri Ortega MRN: [id number] DOB:26-Jun-1941, 81 y.o., female Today's Date: 08/25/2021  PCP: Lawerance Cruel, MD REFERRING PROVIDER: Lawerance Cruel, MD   PT End of Session - 08/25/21 1035     Visit Number 4    Number of Visits 21    Date for PT Re-Evaluation 11/03/21    Authorization Type [employer] MCR    PT Start Time 1034    PT Stop Time 1115    PT Time Calculation (min) 41 min    Activity Tolerance Patient tolerated treatment well             Past Medical History:  Diagnosis Date   Graves disease    HTN (hypertension)    Low back pain    Dr. Ronnald Ramp   Osteoporosis    Pneumonia 2012   Radiation 03/20/14, 03/27/14, 04/05/14, 04/12/14, 04/17/14   bracytherapy to proximal vagina 30 gray   Scoliosis    Uterine cancer ([place])    surgery and radiation x 5 treatments-last 9'15   Vitamin D deficiency    Past Surgical History:  Procedure Laterality Date   BUNIONECTOMY     left   CATARACT EXTRACTION, BILATERAL Bilateral    last done 11-21-14   COLONOSCOPY WITH PROPOFOL N/A 12/25/2014   Procedure: COLONOSCOPY WITH PROPOFOL;  Surgeon: Garlan Fair, MD;  Location: [facility];  Service: Endoscopy;  Laterality: N/A;   FOOT SURGERY Left 2004   Dr Johnnye Sima   JOINT REPLACEMENT     RTKA   ROBOTIC ASSISTED TOTAL HYSTERECTOMY WITH BILATERAL SALPINGO OOPHERECTOMY  01/30/14   with lymph node biopsy   TOTAL KNEE ARTHROPLASTY     right   TOTAL KNEE REVISION Right 01/04/2019   Procedure: TOTAL KNEE REVISION;  Surgeon: Gaynelle Arabian, MD;  Location: [facility];  Service: Orthopedics;  Laterality: Right;  169min with block   Patient Active Problem List   Diagnosis Date Noted   Aortic valve disease 07/10/2021   Dilated cardiomyopathy ([place]) 12/14/2020   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   Orthostatic dizziness 11/14/2020   Educated about COVID-19 virus infection 12/21/2019   Cognitive change  09/06/2019   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   Preop cardiovascular exam 12/20/2018   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   Uterine cancer ([place]) 03/28/2018   Arthralgia of hip or thigh 03/28/2018   Benign essential HTN 03/28/2018   Overweight 03/28/2018   Heart disease 11/09/2014   Leukocytosis 12/75/1700   Diastolic congestive heart failure ([place]) 10/30/2014   Hypothyroidism 10/30/2014   Chest pain at rest 10/30/2014   Chest pain 10/30/2014   Endometrial cancer ([place]) 01/30/2014   Dyspnea 01/11/2014   Edema 01/11/2014   Low back pain with right-sided sciatica 12/13/2013   Degeneration of lumbar or lumbosacral intervertebral disc 12/13/2013   Sciatica 12/13/2013    REFERRING DIAG:  R26.9 (ICD-10-CM) - Gait abnormality  R53.1 (ICD-10-CM) - Weakness  M25.561,M25.562,G89.29 (ICD-10-CM) - Chronic pain of both knees    THERAPY DIAG:  Unsteadiness on feet Muscle weakness (generalized)   Difficulty walking  PERTINENT HISTORY: TKA revision in 2020; L sided LBP; HTN; Graves Disease  PRECAUTIONS: None  SUBJECTIVE: Pt reports no adverse reaction to last visit  PAIN:  Are you having pain? No   Pain location: L/S or L LE when it does occur Aggravating factors: sleeping on the wrong side, standing too long Relieving factors: resting  OBJECTIVE   DIAGNOSTIC FINDINGS:  IMPRESSION: No evidence of acute intracranial abnormality.   Mild-to-moderate generalized atrophy of the brain.   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   Mild paranasal sinus disease as described.   PATIENT SURVEYS:  FOTO unavailable   ABC Scale: 850 / 1600 = 53.1 %   MUSCULOSKELETAL: Tremor: Absent Tone: Normal   Posture No gross abnormalities noted in standing or seated posture; mild kyphosis and rounded shoulders, toe out position   Gait Distance walked: 82ft Assistive  device utilized: Single point cane Level of assistance: Modified independence Comments: widened BOS; shortened step length   Strength R/L 4-/4 Hip flexion 4-/4+ Hip abduction 4+/+ Hip adduction    26 lbs/28lbs Knee extension 5/5 Knee flexion 5/5 Ankle Plantarflexion 5/5 Ankle Dorsiflexion     NEUROLOGICAL:   Mental Status Patient is oriented to person, place and time.      Sensation Grossly intact to light touch bilateral UEs/Les hypersensitivity bilateral LE    Coordination/Cerebellar Finger to Nose: WNL Rapid alternating movements: WNL       FUNCTIONAL OUTCOME MEASURES       Results Comments  TUG 15 seconds Near LOB with L handed turn  5TSTS 20 seconds        TODAY'S TREATMENT    Pt seen for aquatic therapy today.  Treatment took place in water 3.25-4.8 ft in depth at the [facility] pool. Temp of water was 91.  Pt entered/exited the pool via stairs step to pattern and cgaindependently with bilat rail.  Reviewed current function, pain levels, response to prior Rx, and HEP compliance.    Walking using yellow noodle Fwd, bwd and side stepping 4 widths each.  Cues for improved gait  Step ups leading R/L forward x10 with decreasing ue support, cues for weight shifting for increased ms engagement, eccentric control cues Side stepping R/L x10 SL squat R/L 2 x10 assist for engagement of VMO.  Cue for weiht shifting and slowing movement to ensure target muscle usage. Bilat mini squat 2x10  Cycling 2x20 seated on 3rd water step 3x20 Flutter kicking at hip x15 Add/abd x20   Standing exercises: long leg exercises holding to wall: hip add/abd, flex and extension 2x10  Pt requires buoyancy for support and to offload joints with strengthening exercises. Viscosity of the water is needed for resistance of strengthening;  water current perturbations provides challenge to standing balance unsupported, requiring increased core activation.     PATIENT  EDUCATION:  Education details: MOI, diagnosis, prognosis, anatomy, exercise progression, DOMS expectations, muscle firing,  envelope of function, HEP, POC   Person educated: Patient and Spouse Education method: Explanation, Demonstration, Tactile cues, Verbal cues, and Handouts Education comprehension: verbalized understanding, returned demonstration, verbal cues required, tactile cues required, and needs further education 08/11/21 properties of water; benefits of aquatic therapy   HOME EXERCISE PROGRAM: Access Code: [email or web address] URL: [email or web address] Date: 08/05/2021 Prepared by: Daleen Bo   ASSESSMENT:   CLINICAL IMPRESSION: Pt reports no adverse reaction to last visit.  No pain. VMO weakness as evidenced by by difficulty with heel strike. Pt with shortened step length partly due to fear of falling.  Worked VMO eccentrically.  Cues needed for execution. Pt in light forward flexed position in standing with amb in pool. Improved posture with gait training and cues for longer step length.  Improvements in balance with amb submerged to waist.    Patient is a 81 y.o. female who was seen today for physical therapy  evaluation and treatment for cc of balance and gait deficits. At present, pt presents as a falls risk due to LE strength deficits, decrease righting reactions, and MD note report of decreased safety awareness. Pt's history of L sided LBP does appear to affecting L proximal hip strength suggesting potential radicular involvement. Objective impairments include Abnormal gait, decreased activity tolerance, decreased balance, decreased cognition, decreased coordination, decreased endurance, decreased mobility, difficulty walking, decreased ROM, decreased strength, dizziness, impaired flexibility, improper body mechanics, postural dysfunction, and pain. These impairments are limiting patient from cleaning, community activity, driving, laundry, yard work, shopping, and exercise .  Personal factors including Age, Behavior pattern, Fitness, Time since onset of injury/illness/exacerbation, and 1-2 comorbidities:    are also affecting patient's functional outcome. Patient will benefit from skilled PT to address above impairments and improve overall function.   REHAB POTENTIAL: Fair     CLINICAL DECISION MAKING: Evolving/moderate complexity   EVALUATION COMPLEXITY: Moderate     GOALS:     SHORT TERM GOALS:   STG Name Target Date Goal status  1 Pt will become independent with HEP in order to demonstrate synthesis of PT education.   Baseline:  09/16/2021 INITIAL  2 Pt will be able to demonstrate STS from chair without UE in order to demonstrate functional improvement in LE function for self-care and house hold duties.   09/16/2021 INITIAL    LONG TERM GOALS:    LTG Name Target Date Goal status  1 Pt  will become independent with final HEP in order to demonstrate synthesis of PT education.   Baseline: 10/28/2021 INITIAL  2 Pt will be able to perform 5XSTS in under 12s  in order to demonstrate functional improvement above the cut off score for adults.  Baseline: 10/28/2021 INITIAL  3 Pt will be able to demonstrate TUG in under 10 sec in order to demonstrate functional improvement in LE function, strength, balance, and mobility for safety with community ambulation.   Baseline: 10/28/2021 INITIAL  4 Pt will be able to demonstrate ability to ambulate with even stride length and shoulder/hip width BOS without LOB in order to demonstrate functional improvement in balance and gait for self-care and house hold duties.   Baseline: 10/28/2021 INITIAL    PLAN: PT FREQUENCY: 1-2x/week   PT DURATION: 12 weeks   PLANNED INTERVENTIONS: Therapeutic exercises, Therapeutic activity, Neuro Muscular re-education, Balance training, Gait training, Patient/Family education, Joint mobilization, Stair training, Vestibular training, Aquatic Therapy, Dry Needling, Electrical stimulation,  Spinal mobilization, Cryotherapy, Moist heat, scar mobilization, Taping, Vasopneumatic device, Traction, Ultrasound, Ionotophoresis 4mg /ml Dexamethasone, and Manual therapy   PLAN FOR NEXT SESSION: work on balance; R hip strength and stability; add aquatic HEP when appropriate    Vedia Pereyra MPT 08/25/2021, 1:19 PM

## 2021-09-01 ENCOUNTER — Ambulatory Visit (HOSPITAL_BASED_OUTPATIENT_CLINIC_OR_DEPARTMENT_OTHER): Payer: Medicare PPO | Admitting: Physical Therapy

## 2021-09-01 ENCOUNTER — Other Ambulatory Visit: Payer: Self-pay

## 2021-09-01 ENCOUNTER — Encounter (HOSPITAL_BASED_OUTPATIENT_CLINIC_OR_DEPARTMENT_OTHER): Payer: Self-pay | Admitting: Physical Therapy

## 2021-09-01 DIAGNOSIS — M25561 Pain in right knee: Secondary | ICD-10-CM | POA: Diagnosis not present

## 2021-09-01 DIAGNOSIS — R269 Unspecified abnormalities of gait and mobility: Secondary | ICD-10-CM | POA: Diagnosis not present

## 2021-09-01 DIAGNOSIS — M6281 Muscle weakness (generalized): Secondary | ICD-10-CM | POA: Diagnosis not present

## 2021-09-01 DIAGNOSIS — R2681 Unsteadiness on feet: Secondary | ICD-10-CM

## 2021-09-01 DIAGNOSIS — G8929 Other chronic pain: Secondary | ICD-10-CM | POA: Diagnosis not present

## 2021-09-01 DIAGNOSIS — R262 Difficulty in walking, not elsewhere classified: Secondary | ICD-10-CM

## 2021-09-01 DIAGNOSIS — M25562 Pain in left knee: Secondary | ICD-10-CM | POA: Diagnosis not present

## 2021-09-01 NOTE — Therapy (Signed)
OUTPATIENT PHYSICAL THERAPY TREATMENT NOTE   Patient Name: Terri Ortega MRN: [id number] DOB:November 24, 1940, 81 y.o., female Today's Date: 09/01/2021  PCP: Lawerance Cruel, MD REFERRING PROVIDER: Lawerance Cruel, MD   PT End of Session - 09/01/21 1041     Visit Number 5    Number of Visits 21    Date for PT Re-Evaluation 11/03/21    Authorization Type [employer] MCR    PT Start Time 1034    PT Stop Time 1112    PT Time Calculation (min) 38 min    Activity Tolerance Patient tolerated treatment well             Past Medical History:  Diagnosis Date   Graves disease    HTN (hypertension)    Low back pain    Dr. Ronnald Ramp   Osteoporosis    Pneumonia 2012   Radiation 03/20/14, 03/27/14, 04/05/14, 04/12/14, 04/17/14   bracytherapy to proximal vagina 30 gray   Scoliosis    Uterine cancer ([place])    surgery and radiation x 5 treatments-last 9'15   Vitamin D deficiency    Past Surgical History:  Procedure Laterality Date   BUNIONECTOMY     left   CATARACT EXTRACTION, BILATERAL Bilateral    last done 11-21-14   COLONOSCOPY WITH PROPOFOL N/A 12/25/2014   Procedure: COLONOSCOPY WITH PROPOFOL;  Surgeon: Garlan Fair, MD;  Location: [facility];  Service: Endoscopy;  Laterality: N/A;   FOOT SURGERY Left 2004   Dr Johnnye Sima   JOINT REPLACEMENT     RTKA   ROBOTIC ASSISTED TOTAL HYSTERECTOMY WITH BILATERAL SALPINGO OOPHERECTOMY  01/30/14   with lymph node biopsy   TOTAL KNEE ARTHROPLASTY     right   TOTAL KNEE REVISION Right 01/04/2019   Procedure: TOTAL KNEE REVISION;  Surgeon: Gaynelle Arabian, MD;  Location: [facility];  Service: Orthopedics;  Laterality: Right;  134min with block   Patient Active Problem List   Diagnosis Date Noted   Aortic valve disease 07/10/2021   Dilated cardiomyopathy ([place]) 12/14/2020   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   Orthostatic dizziness 11/14/2020   Educated about COVID-19 virus infection 12/21/2019   Cognitive change  09/06/2019   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/2019   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/04/2019   Preop cardiovascular exam 12/20/2018   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   Uterine cancer ([place]) 03/28/2018   Arthralgia of hip or thigh 03/28/2018   Benign essential HTN 03/28/2018   Overweight 03/28/2018   Heart disease 11/09/2014   Leukocytosis 21/22/4825   Diastolic congestive heart failure ([place]) 10/30/2014   Hypothyroidism 10/30/2014   Chest pain at rest 10/30/2014   Chest pain 10/30/2014   Endometrial cancer ([place]) 01/30/2014   Dyspnea 01/11/2014   Edema 01/11/2014   Low back pain with right-sided sciatica 12/13/2013   Degeneration of lumbar or lumbosacral intervertebral disc 12/13/2013   Sciatica 12/13/2013    REFERRING DIAG:  R26.9 (ICD-10-CM) - Gait abnormality  R53.1 (ICD-10-CM) - Weakness  M25.561,M25.562,G89.29 (ICD-10-CM) - Chronic pain of both knees    THERAPY DIAG:  Unsteadiness on feet Muscle weakness (generalized)   Difficulty walking  PERTINENT HISTORY: TKA revision in 2020; L sided LBP; HTN; Graves Disease  PRECAUTIONS: None  SUBJECTIVE: "I am not having any pain; I am so careful not to fall"  PAIN:  Are you having pain? No   Pain location: L/S or L LE when it does occur Aggravating factors: sleeping on the wrong side, standing  too long Relieving factors: resting      OBJECTIVE   DIAGNOSTIC FINDINGS:  IMPRESSION: No evidence of acute intracranial abnormality.   Mild-to-moderate generalized atrophy of the brain.   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   Mild paranasal sinus disease as described.   PATIENT SURVEYS:  FOTO unavailable   ABC Scale: 850 / 1600 = 53.1 %   MUSCULOSKELETAL: Tremor: Absent Tone: Normal   Posture No gross abnormalities noted in standing or seated posture; mild kyphosis and rounded shoulders, toe out position   Gait Distance walked:  60ft Assistive device utilized: Single point cane Level of assistance: Modified independence Comments: widened BOS; shortened step length   Strength R/L 4-/4 Hip flexion 4-/4+ Hip abduction 4+/+ Hip adduction    26 lbs/28lbs Knee extension 5/5 Knee flexion 5/5 Ankle Plantarflexion 5/5 Ankle Dorsiflexion     NEUROLOGICAL:   Mental Status Patient is oriented to person, place and time.      Sensation Grossly intact to light touch bilateral UEs/Les hypersensitivity bilateral LE    Coordination/Cerebellar Finger to Nose: WNL Rapid alternating movements: WNL       FUNCTIONAL OUTCOME MEASURES       Results Comments  TUG 15 seconds Near LOB with L handed turn  5TSTS 20 seconds        TODAY'S TREATMENT    Pt seen for aquatic therapy today.  Treatment took place in water 3.25-4.8 ft in depth at the [facility] pool. Temp of water was 91.  Pt entered/exited the pool via stairs step to pattern and cgaindependently with bilat rail.  Reviewed current function, pain levels, response to prior Rx, and HEP compliance.    Walking using yellow noodle Fwd, bwd and side stepping 4 widths each.    Cycling 2x20 seated on 3rd water step 3x20 Flutter kicking at hip x15 Add/abd x20   Standing exercises (progressed strengthening and balance challenge with ue support of yellow hand buoys): long leg exercises : hip add/abd, flex and extension 2x10 -DF and PF x20.  Cues for tightened core for improved balance and core control. -kick board push downs 3x15. Cues for abdominal hallowing for added  core engagement.  Gait training using hand buoys: cues for step length and heel strike.  Pt requires buoyancy for support and to offload joints with strengthening exercises. Viscosity of the water is needed for resistance of strengthening;  water current perturbations provides challenge to standing balance unsupported, requiring increased core activation.     PATIENT EDUCATION:   Education details: MOI, diagnosis, prognosis, anatomy, exercise progression, DOMS expectations, muscle firing,  envelope of function, HEP, POC   Person educated: Patient and Spouse Education method: Explanation, Demonstration, Tactile cues, Verbal cues, and Handouts Education comprehension: verbalized understanding, returned demonstration, verbal cues required, tactile cues required, and needs further education 08/11/21 properties of water; benefits of aquatic therapy   HOME EXERCISE PROGRAM: Access Code: [email or web address] URL: [email or web address] Date: 08/05/2021 Prepared by: Daleen Bo   ASSESSMENT:   CLINICAL IMPRESSION: Focus today on core strengthening and balance challenges.  Pt with multiple small to moderate LOB with all exercises standing with ue supported by hand buoys.  Pt able to regain balance indep with each episode.  She demonstrates decreased fear of LOB/falling while submerged.  She will continue to benefit from aquatic therapy to improve functional mobility and safety, decreasing fall risk       Patient is a 81 y.o. female who was seen today for physical therapy evaluation  and treatment for cc of balance and gait deficits. At present, pt presents as a falls risk due to LE strength deficits, decrease righting reactions, and MD note report of decreased safety awareness. Pt's history of L sided LBP does appear to affecting L proximal hip strength suggesting potential radicular involvement. Objective impairments include Abnormal gait, decreased activity tolerance, decreased balance, decreased cognition, decreased coordination, decreased endurance, decreased mobility, difficulty walking, decreased ROM, decreased strength, dizziness, impaired flexibility, improper body mechanics, postural dysfunction, and pain. These impairments are limiting patient from cleaning, community activity, driving, laundry, yard work, shopping, and exercise . Personal factors including Age, Behavior  pattern, Fitness, Time since onset of injury/illness/exacerbation, and 1-2 comorbidities:    are also affecting patient's functional outcome. Patient will benefit from skilled PT to address above impairments and improve overall function.   REHAB POTENTIAL: Fair     CLINICAL DECISION MAKING: Evolving/moderate complexity   EVALUATION COMPLEXITY: Moderate     GOALS:     SHORT TERM GOALS:   STG Name Target Date Goal status  1 Pt will become independent with HEP in order to demonstrate synthesis of PT education.   Baseline:  09/16/2021 INITIAL  2 Pt will be able to demonstrate STS from chair without UE in order to demonstrate functional improvement in LE function for self-care and house hold duties.   09/16/2021 INITIAL    LONG TERM GOALS:    LTG Name Target Date Goal status  1 Pt  will become independent with final HEP in order to demonstrate synthesis of PT education.   Baseline: 10/28/2021 INITIAL  2 Pt will be able to perform 5XSTS in under 12s  in order to demonstrate functional improvement above the cut off score for adults.  Baseline: 10/28/2021 INITIAL  3 Pt will be able to demonstrate TUG in under 10 sec in order to demonstrate functional improvement in LE function, strength, balance, and mobility for safety with community ambulation.   Baseline: 10/28/2021 INITIAL  4 Pt will be able to demonstrate ability to ambulate with even stride length and shoulder/hip width BOS without LOB in order to demonstrate functional improvement in balance and gait for self-care and house hold duties.   Baseline: 10/28/2021 INITIAL    PLAN: PT FREQUENCY: 1-2x/week   PT DURATION: 12 weeks   PLANNED INTERVENTIONS: Therapeutic exercises, Therapeutic activity, Neuro Muscular re-education, Balance training, Gait training, Patient/Family education, Joint mobilization, Stair training, Vestibular training, Aquatic Therapy, Dry Needling, Electrical stimulation, Spinal mobilization, Cryotherapy, Moist heat,  scar mobilization, Taping, Vasopneumatic device, Traction, Ultrasound, Ionotophoresis 4mg /ml Dexamethasone, and Manual therapy   PLAN FOR NEXT SESSION: work on balance; R hip strength and stability; add aquatic HEP when appropriate    Vedia Pereyra MPT 09/01/2021, 1:44 PM

## 2021-09-08 ENCOUNTER — Other Ambulatory Visit: Payer: Self-pay

## 2021-09-08 ENCOUNTER — Ambulatory Visit (HOSPITAL_BASED_OUTPATIENT_CLINIC_OR_DEPARTMENT_OTHER): Payer: Medicare PPO | Attending: Neurology | Admitting: Physical Therapy

## 2021-09-08 ENCOUNTER — Encounter (HOSPITAL_BASED_OUTPATIENT_CLINIC_OR_DEPARTMENT_OTHER): Payer: Self-pay | Admitting: Physical Therapy

## 2021-09-08 DIAGNOSIS — M6281 Muscle weakness (generalized): Secondary | ICD-10-CM | POA: Diagnosis not present

## 2021-09-08 DIAGNOSIS — R262 Difficulty in walking, not elsewhere classified: Secondary | ICD-10-CM | POA: Insufficient documentation

## 2021-09-08 DIAGNOSIS — R2681 Unsteadiness on feet: Secondary | ICD-10-CM | POA: Insufficient documentation

## 2021-09-08 NOTE — Therapy (Signed)
OUTPATIENT PHYSICAL THERAPY PROGRESS NOTE  Progress Note Reporting Period 08/05/21 to 09/08/21  See note below for Objective Data and Assessment of Progress/Goals.       Patient Name: Terri Ortega MRN: [id number] DOB:1941-07-29, 81 y.o., female Today's Date: 09/08/2021  PCP: Lawerance Cruel, MD REFERRING PROVIDER: Lawerance Cruel, MD   PT End of Session - 09/08/21 1354     Visit Number 6    Number of Visits 21    Date for PT Re-Evaluation 11/03/21    Authorization Type [employer] MCR    PT Start Time 1350    PT Stop Time 4008    PT Time Calculation (min) 35 min    Activity Tolerance Patient tolerated treatment well    Behavior During Therapy [facility] for tasks assessed/performed              Past Medical History:  Diagnosis Date   Graves disease    HTN (hypertension)    Low back pain    Dr. Ronnald Ramp   Osteoporosis    Pneumonia 2012   Radiation 03/20/14, 03/27/14, 04/05/14, 04/12/14, 04/17/14   bracytherapy to proximal vagina 30 gray   Scoliosis    Uterine cancer ([place])    surgery and radiation x 5 treatments-last 9'15   Vitamin D deficiency    Past Surgical History:  Procedure Laterality Date   BUNIONECTOMY     left   CATARACT EXTRACTION, BILATERAL Bilateral    last done 11-21-14   COLONOSCOPY WITH PROPOFOL N/A 12/25/2014   Procedure: COLONOSCOPY WITH PROPOFOL;  Surgeon: Garlan Fair, MD;  [facility];  Service: Endoscopy;  Laterality: N/A;   FOOT SURGERY Left 2004   Dr Johnnye Sima   JOINT REPLACEMENT     RTKA   ROBOTIC ASSISTED TOTAL HYSTERECTOMY WITH BILATERAL SALPINGO OOPHERECTOMY  01/30/14   with lymph node biopsy   TOTAL KNEE ARTHROPLASTY     right   TOTAL KNEE REVISION Right 01/04/2019   Procedure: TOTAL KNEE REVISION;  Surgeon: Gaynelle Arabian, MD;  Location: [facility];  Service: Orthopedics;  Laterality: Right;  136mn with block   Patient Active Problem List   Diagnosis Date Noted   Aortic valve disease 07/10/2021   [place]ilated [place]  [place] 12/14/2020   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   Orthostatic dizziness 11/14/2020   Educated about COVID-19 virus infection 12/21/2019   Cognitive change 09/06/2019   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 01/04[place]   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 ([place] 01/04/2019   Preop cardiovascular exam 12/20/2018   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   [place] 03/28/[place]   Arthralgia of hip or thigh 03/28/2018   Benign essential HTN 03/28/2018   Overweight 03/28/2018   Heart disease 11/09/2014   Leukocytosis 067/61/9509  Diastolic congestive heart failure ([facility] 10/30/2014   Hypothyroidism 10/30/2014   Chest pain at rest 10/30/2014   Chest pain 10/30/2014   [place] 01/30/2014   Dyspnea 01/11/2014   Edema 01/11/2014   Low back pain with right-sided sciatica 12/13/2013   Degeneration of lumbar or lumbosacral intervertebral disc 12/13/2013   Sciatica 12/13/2013    REFERRING DIAG:  R26.9 (ICD-10-CM) - Gait abnormality  R53.1 (ICD-10-CM) - Weakness  M25.561,M25.562,G89.29 (ICD-10-CM) - Chronic pain of both knees    THERAPY DIAG:  Unsteadiness on feet Muscle weakness (generalized)   Difficulty walking  PERTINENT HISTORY: TKA revision in 2020; L sided LBP; HTN; Graves Disease  PRECAUTIONS: None  SUBJECTIVE:  Pt states that things are a  little better but she still feels very unsteady with her walking. Her confidence has not improved. She does feel that the aquatic environment has improved her balance. Pt reports not doing HEP most days.   PAIN:  Are you having pain? No   Pain location: L/S or L LE when it does occur Aggravating factors: sleeping on the wrong side, standing too long Relieving factors: resting   OBJECTIVE   DIAGNOSTIC FINDINGS:  IMPRESSION: No evidence of acute intracranial abnormality.   Mild-to-moderate generalized atrophy of the brain.   Moderate cerebral white matter chronic small vessel  ischemic disease.   Mild paranasal sinus disease as described.   PATIENT SURVEYS:  FOTO unavailable   ABC Scale: 850 / 1600 = 53.1 %  ABC Scale 6th visit (2/6)=   510 / 1600 = 31.9 %   MUSCULOSKELETAL: Tremor: Absent Tone: Normal   Posture No gross abnormalities noted in standing or seated posture; mild kyphosis and rounded shoulders, toe out position   Gait Distance walked: 65f Assistive device utilized: Single point cane Level of assistance: Modified independence Comments: widened BOS; shortened step length   Strength R/L 4/4 Hip flexion 4/4+ Hip abduction 4+/+ Hip adduction    4+/4+ Knee extension 5/5 Knee flexion 5/5 Ankle Plantarflexion 5/5 Ankle Dorsiflexion        FUNCTIONAL OUTCOME MEASURES    Eval   Results Comments  TUG 15 seconds Near LOB with L handed turn  5TSTS 20 seconds       09/08/21   Results Comments  TUG 13 seconds   5TSTS 21 seconds  decreased eccentric lowering strength     TODAY'S TREATMENT   09/08/21  Modification and discussion of HEP importance and compliance Retro stepping at wall 2x laps Fwd marching at wall 2x laps  Sidestepping at wall 2x laps   Previous:  Pt seen for aquatic therapy today.  Treatment took place in water 3.25-4.[facility] ft [facility] Temp of water was 91.  Pt entered/exited the pool via stairs step to pattern and cgaindependently with bilat rail.  Reviewed current function, pain levels, response to prior Rx, and HEP compliance.    Walking using yellow noodle Fwd, bwd and side stepping 4 widths each.    Cycling 2x20 seated on 3rd water step 3x20 Flutter kicking at hip x15 Add/abd x20   Standing exercises (progressed strengthening and balance challenge with ue support of yellow hand buoys): long leg exercises : hip add/abd, flex and extension 2x10 -DF and PF x20.  Cues for tightened core for improved balance and core control. -kick board push downs 3x15. Cues for abdominal  hallowing for added  core engagement.  Gait training using hand buoys: cues for step length and heel strike.  Pt requires buoyancy for support and to offload joints with strengthening exercises. Viscosity of the water is needed for resistance of strengthening;  water current perturbations provides challenge to standing balance unsupported, requiring increased core activation.     PATIENT EDUCATION:  Education details: progression/exam findings, anatomy, exercise progression, balance deficits, muscle firing,  envelope of function, HEP, POC   Person educated: Patient and Spouse Education method: Explanation, Demonstration, Tactile cues, Verbal cues, and Handouts Education comprehension: verbalized understanding, returned demonstration, verbal cues required, tactile cues required, and needs further education 08/11/21 properties of water; benefits of aquatic therapy   HOME EXERCISE PROGRAM: Access Code: [email or web address] URL: [email or web address] Date: 08/05/2021 Prepared by: ADaleen Bo  ASSESSMENT:   CLINICAL IMPRESSION: Pt has  min/moderate improvement with functional outcome measures at re-assessment. Pt reports improvement with comfort during standing balance but has decreased ABC scale score. Pt able to comfortably perform standing balance challenges with therapy nearby. However, she reports feeling uncomfortable without supervision- lacking confidence with dynamic activity. Plan to continue with aquatic therapy balance sessions with focus on increased loading of bilat LE. Pt's largest deficits are CKC LE strength and proximal hip strength.    Objective impairments include Abnormal gait, decreased activity tolerance, decreased balance, decreased cognition, decreased coordination, decreased endurance, decreased mobility, difficulty walking, decreased ROM, decreased strength, dizziness, impaired flexibility, improper body mechanics, postural dysfunction, and pain. These impairments are  limiting patient from cleaning, community activity, driving, laundry, yard work, shopping, and exercise . Personal factors including Age, Behavior pattern, Fitness, Time since onset of injury/illness/exacerbation, and 1-2 comorbidities:    are also affecting patient's functional outcome. Patient will benefit from skilled PT to address above impairments and improve overall function.   REHAB POTENTIAL: Fair     CLINICAL DECISION MAKING: Evolving/moderate complexity   EVALUATION COMPLEXITY: Moderate     GOALS:     SHORT TERM GOALS:   STG Name Target Date Goal status  1 Pt will become independent with HEP in order to demonstrate synthesis of PT education.   Baseline:  09/16/2021 ongoing  2 Pt will be able to demonstrate STS from chair without UE in order to demonstrate functional improvement in LE function for self-care and house hold duties.   09/16/2021 MET    LONG TERM GOALS:    LTG Name Target Date Goal status  1 Pt  will become independent with final HEP in order to demonstrate synthesis of PT education.   Baseline: 10/28/2021 ongoing  2 Pt will be able to perform 5XSTS in under 12s  in order to demonstrate functional improvement above the cut off score for adults.  Baseline: 10/28/2021 ongoing  3 Pt will be able to demonstrate TUG in under 10 sec in order to demonstrate functional improvement in LE function, strength, balance, and mobility for safety with community ambulation.   Baseline: 10/28/2021 ongoing  4 Pt will be able to demonstrate ability to ambulate with even stride length and shoulder/hip width BOS without LOB in order to demonstrate functional improvement in balance and gait for self-care and house hold duties.   Baseline: 10/28/2021 Partially met    PLAN: PT FREQUENCY: 1-2x/week   PT DURATION: 12 weeks   PLANNED INTERVENTIONS: Therapeutic exercises, Therapeutic activity, Neuro Muscular re-education, Balance training, Gait training, Patient/Family education, Joint  mobilization, Stair training, Vestibular training, Aquatic Therapy, Dry Needling, Electrical stimulation, Spinal mobilization, Cryotherapy, Moist heat, scar mobilization, Taping, Vasopneumatic device, Traction, Ultrasound, Ionotophoresis 83m/ml Dexamethasone, and Manual therapy   PLAN FOR NEXT SESSION: increase bilat LE strengthening, single leg stability work  ADaleen BoPT, DPT 09/08/21 2:30 PM

## 2021-09-16 ENCOUNTER — Ambulatory Visit (HOSPITAL_BASED_OUTPATIENT_CLINIC_OR_DEPARTMENT_OTHER): Payer: Medicare PPO | Admitting: Physical Therapy

## 2021-09-16 ENCOUNTER — Encounter (HOSPITAL_BASED_OUTPATIENT_CLINIC_OR_DEPARTMENT_OTHER): Payer: Self-pay | Admitting: Physical Therapy

## 2021-09-16 ENCOUNTER — Other Ambulatory Visit: Payer: Self-pay

## 2021-09-16 DIAGNOSIS — R262 Difficulty in walking, not elsewhere classified: Secondary | ICD-10-CM

## 2021-09-16 DIAGNOSIS — M6281 Muscle weakness (generalized): Secondary | ICD-10-CM | POA: Diagnosis not present

## 2021-09-16 DIAGNOSIS — R2681 Unsteadiness on feet: Secondary | ICD-10-CM | POA: Diagnosis not present

## 2021-09-16 NOTE — Therapy (Signed)
OUTPATIENT PHYSICAL THERAPY PROGRESS NOTE     Patient Name: Terri Ortega MRN: [id number] DOB:1941-01-10, 81 y.o., female Today's Date: 09/16/2021  PCP: Lawerance Cruel, MD REFERRING PROVIDER: Lawerance Cruel, MD   PT End of Session - 09/16/21 1040     Visit Number 7    Number of Visits 21    Date for PT Re-Evaluation 11/03/21    Authorization Type [employer] MCR    PT Start Time 1035    PT Stop Time 1115    PT Time Calculation (min) 40 min    Activity Tolerance Patient tolerated treatment well    Behavior During Therapy [facility] for tasks assessed/performed              Past Medical History:  Diagnosis Date   Graves disease    HTN (hypertension)    Low back pain    Dr. Ronnald Ramp   Osteoporosis    Pneumonia 2012   Radiation 03/20/14, 03/27/14, 04/05/14, 04/12/14, 04/17/14   bracytherapy to proximal vagina 30 gray   Scoliosis    Uterine cancer ([place])    surgery and radiation x 5 treatments-last 9'15   Vitamin D deficiency    Past Surgical History:  Procedure Laterality Date   BUNIONECTOMY     left   CATARACT EXTRACTION, BILATERAL Bilateral    last done 11-21-14   COLONOSCOPY WITH PROPOFOL N/A 12/25/2014   Procedure: COLONOSCOPY WITH PROPOFOL;  Surgeon: Garlan Fair, MD;  Location: [facility];  Service: Endoscopy;  Laterality: N/A;   FOOT SURGERY Left 2004   Dr Johnnye Sima   JOINT REPLACEMENT     RTKA   ROBOTIC ASSISTED TOTAL HYSTERECTOMY WITH BILATERAL SALPINGO OOPHERECTOMY  01/30/14   with lymph node biopsy   TOTAL KNEE ARTHROPLASTY     right   TOTAL KNEE REVISION Right 01/04/2019   Procedure: TOTAL KNEE REVISION;  Surgeon: Gaynelle Arabian, MD;  Location: [facility];  Service: Orthopedics;  Laterality: Right;  167mn with block   Patient Active Problem List   Diagnosis Date Noted   Aortic valve disease 07/10/2021   Dilated cardiomyopathy ([place] 12/14/2020   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   Orthostatic dizziness 11/14/2020   Educated about  COVID-19 virus infection 12/21/2019   Cognitive change 09/06/2019   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 01/04/2019   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 ([place] 01/04/2019   Preop cardiovascular exam 12/20/2018   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   [place] 03/28/2018   Arthralgia of hip or thigh 03/28/2018   Benign essential HTN 03/28/2018   Overweight 03/28/2018   Heart disease 11/09/2014   Leukocytosis 045/36/4680  Diastolic congestive heart failure ([place] 10/30/2014   Hypothyroidism 10/30/2014   Chest pain at rest 10/30/2014   Chest pain 10/30/2014   [place]ometrial cancer ([place] 01/30/2014   Dyspnea 01/11/2014   Edema 01/11/2014   Low back pain with right-sided sciatica 12/13/2013   Degeneration of lumbar or lumbosacral intervertebral disc 12/13/2013   Sciatica 12/13/2013    REFERRING DIAG:  R26.9 (ICD-10-CM) - Gait abnormality  R53.1 (ICD-10-CM) - Weakness  M25.561,M25.562,G89.29 (ICD-10-CM) - Chronic pain of both knees    THERAPY DIAG:  Unsteadiness on feet Muscle weakness (generalized)   Difficulty walking  PERTINENT HISTORY: TKA revision in 2020; L sided LBP; HTN; Graves Disease  PRECAUTIONS: None  SUBJECTIVE:  Pt reports some increase in compliance with exercises at home. No pain  PAIN:  Are you having pain? No   Pain location: L/S or L  LE when it does occur Aggravating factors: sleeping on the wrong side, standing too long Relieving factors: resting   OBJECTIVE   DIAGNOSTIC FINDINGS:  IMPRESSION: No evidence of acute intracranial abnormality.   Mild-to-moderate generalized atrophy of the brain.   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   Mild paranasal sinus disease as described.   PATIENT SURVEYS:  FOTO unavailable   ABC Scale: 850 / 1600 = 53.1 %  ABC Scale 6th visit (2/6)=   510 / 1600 = 31.9 %   MUSCULOSKELETAL: Tremor: Absent Tone: Normal   Posture No gross abnormalities noted in  standing or seated posture; mild kyphosis and rounded shoulders, toe out position   Gait Distance walked: 28f Assistive device utilized: Single point cane Level of assistance: Modified independence Comments: widened BOS; shortened step length   Strength R/L 4/4 Hip flexion 4/4+ Hip abduction 4+/+ Hip adduction    4+/4+ Knee extension 5/5 Knee flexion 5/5 Ankle Plantarflexion 5/5 Ankle Dorsiflexion        FUNCTIONAL OUTCOME MEASURES    Eval   Results Comments  TUG 15 seconds Near LOB with L handed turn  5TSTS 20 seconds       09/08/21   Results Comments  TUG 13 seconds   5TSTS 21 seconds  decreased eccentric lowering strength     TODAY'S TREATMENT   09/16/21 Pt seen for aquatic therapy today.  Treatment took place in water 3.25-4.8 ft [facility] Temp of water was 91.  Pt entered/exited the pool via stairs step to pattern and cgaindependently with bilat rail.  Reviewed current function, pain levels, response to prior Rx, and HEP compliance.    Walking using yellow noodle Fwd, bwd and side stepping 4 widths each.    Added 3 lb ankle weights:  Seated Cycling  3x20 Flutter kicking at hip 3x20 Add/abd 3 x20   Standing  -supported by wall and han buoy: hip circles in hip flex and abd clockwise and counter  R/L ea x10  -mini squats on bottom water step 2x10 -SL squat R/L x10   Balance  Ue support of blue hand buoys:  Small BOS with vision then VE. Able to hold > 20 seconds  Tandem leading with R/L several tries with best 15 with vision  SLS with vision. Several tries best x 7 seconds. Increased difficulty Left vs right.   Pt requires buoyancy for support and to offload joints with strengthening exercises. Viscosity of the water is needed for resistance of strengthening;  water current perturbations provides challenge to standing balance unsupported, requiring increased core activation.         PATIENT EDUCATION:   Education details: progression/exam findings, anatomy, exercise progression, balance deficits, muscle firing,  envelope of function, HEP, POC   Person educated: Patient and Spouse Education method: Explanation, Demonstration, Tactile cues, Verbal cues, and Handouts Education comprehension: verbalized understanding, returned demonstration, verbal cues required, tactile cues required, and needs further education 08/11/21 properties of water; benefits of aquatic therapy   HOME EXERCISE PROGRAM: Access Code: [email or web address] URL: [email or web address] Date: 08/05/2021 Prepared by: ADaleen Bo  ASSESSMENT:   CLINICAL IMPRESSION: Pt reports good response from last session.  No pain.  Added 3 lb weights for added resistance/load with focus on proximal strengthening. CKC activity added as well as static balance. Mild fatigue upon completion today. Will continue to progress to improve function and safety.  Pt has min/moderate improvement with functional outcome measures at re-assessment. Pt  reports improvement with comfort during standing balance but has decreased ABC scale score. Pt able to comfortably perform standing balance challenges with therapy nearby. However, she reports feeling uncomfortable without supervision- lacking confidence with dynamic activity. Plan to continue with aquatic therapy balance sessions with focus on increased loading of bilat LE. Pt's largest deficits are CKC LE strength and proximal hip strength.    Objective impairments include Abnormal gait, decreased activity tolerance, decreased balance, decreased cognition, decreased coordination, decreased endurance, decreased mobility, difficulty walking, decreased ROM, decreased strength, dizziness, impaired flexibility, improper body mechanics, postural dysfunction, and pain. These impairments are limiting patient from cleaning, community activity, driving, laundry, yard work, shopping, and exercise . Personal factors  including Age, Behavior pattern, Fitness, Time since onset of injury/illness/exacerbation, and 1-2 comorbidities:    are also affecting patient's functional outcome. Patient will benefit from skilled PT to address above impairments and improve overall function.   REHAB POTENTIAL: Fair     CLINICAL DECISION MAKING: Evolving/moderate complexity   EVALUATION COMPLEXITY: Moderate     GOALS:     SHORT TERM GOALS:   STG Name Target Date Goal status  1 Pt will become independent with HEP in order to demonstrate synthesis of PT education.   Baseline:  09/16/2021 ongoing  2 Pt will be able to demonstrate STS from chair without UE in order to demonstrate functional improvement in LE function for self-care and house hold duties.   09/16/2021 MET    LONG TERM GOALS:    LTG Name Target Date Goal status  1 Pt  will become independent with final HEP in order to demonstrate synthesis of PT education.   Baseline: 10/28/2021 ongoing  2 Pt will be able to perform 5XSTS in under 12s  in order to demonstrate functional improvement above the cut off score for adults.  Baseline: 10/28/2021 ongoing  3 Pt will be able to demonstrate TUG in under 10 sec in order to demonstrate functional improvement in LE function, strength, balance, and mobility for safety with community ambulation.   Baseline: 10/28/2021 ongoing  4 Pt will be able to demonstrate ability to ambulate with even stride length and shoulder/hip width BOS without LOB in order to demonstrate functional improvement in balance and gait for self-care and house hold duties.   Baseline: 10/28/2021 Partially met    PLAN: PT FREQUENCY: 1-2x/week   PT DURATION: 12 weeks   PLANNED INTERVENTIONS: Therapeutic exercises, Therapeutic activity, Neuro Muscular re-education, Balance training, Gait training, Patient/Family education, Joint mobilization, Stair training, Vestibular training, Aquatic Therapy, Dry Needling, Electrical stimulation, Spinal  mobilization, Cryotherapy, Moist heat, scar mobilization, Taping, Vasopneumatic device, Traction, Ultrasound, Ionotophoresis 39m/ml Dexamethasone, and Manual therapy   PLAN FOR NEXT SESSION: increase bilat loaded LE strengthening , single leg stability work, bHeritage manager(Frankie) Coriann Brouhard MPT 09/16/21 11:28 AM

## 2021-09-19 ENCOUNTER — Other Ambulatory Visit: Payer: Self-pay

## 2021-09-19 ENCOUNTER — Ambulatory Visit (HOSPITAL_BASED_OUTPATIENT_CLINIC_OR_DEPARTMENT_OTHER): Payer: Medicare PPO | Admitting: Physical Therapy

## 2021-09-19 ENCOUNTER — Encounter (HOSPITAL_BASED_OUTPATIENT_CLINIC_OR_DEPARTMENT_OTHER): Payer: Self-pay | Admitting: Physical Therapy

## 2021-09-19 DIAGNOSIS — R2681 Unsteadiness on feet: Secondary | ICD-10-CM | POA: Diagnosis not present

## 2021-09-19 DIAGNOSIS — M6281 Muscle weakness (generalized): Secondary | ICD-10-CM

## 2021-09-19 DIAGNOSIS — R262 Difficulty in walking, not elsewhere classified: Secondary | ICD-10-CM | POA: Diagnosis not present

## 2021-09-19 NOTE — Therapy (Signed)
OUTPATIENT PHYSICAL THERAPY PROGRESS NOTE     Patient Name: Terri Ortega MRN: [id number] DOB:1941-04-26, 81 y.o., female Today's Date: 09/19/2021  PCP: Lawerance Cruel, MD REFERRING PROVIDER: Lawerance Cruel, MD   PT End of Session - 09/19/21 1135     Visit Number 8    Number of Visits 21    Date for PT Re-Evaluation 11/03/21    Authorization Type [employer] MCR    PT Start Time 1116    PT Stop Time 1200    PT Time Calculation (min) 44 min    Activity Tolerance Patient tolerated treatment well    Behavior During Therapy [facility] for tasks assessed/performed              Past Medical History:  Diagnosis Date   Graves disease    HTN (hypertension)    Low back pain    Dr. Ronnald Ramp   Osteoporosis    Pneumonia 2012   Radiation 03/20/14, 03/27/14, 04/05/14, 04/12/14, 04/17/14   bracytherapy to proximal vagina 30 gray   Scoliosis    Uterine cancer ([place])    surgery and radiation x 5 treatments-last 9'15   Vitamin D deficiency    Past Surgical History:  Procedure Laterality Date   BUNIONECTOMY     left   CATARACT EXTRACTION, BILATERAL Bilateral    last done 11-21-14   COLONOSCOPY WITH PROPOFOL N/A 12/25/2014   Procedure: COLONOSCOPY WITH PROPOFOL;  Surgeon: Garlan Fair, MD;  [facility];  Service: Endoscopy;  Laterality: N/A;   FOOT SURGERY Left 2004   Dr Johnnye Sima   JOINT REPLACEMENT     RTKA   ROBOTIC ASSISTED TOTAL HYSTERECTOMY WITH BILATERAL SALPINGO OOPHERECTOMY  01/30/14   with lymph node biopsy   TOTAL KNEE ARTHROPLASTY     right   TOTAL KNEE REVISION Right 01/04/2019   Procedure: TOTAL KNEE REVISION;  Surgeon: Gaynelle Arabian, MD;  [facility];  Service: Orthopedics;  Laterality: Right;  149mn with block   Patient Active Problem List   Diagnosis Date Noted   Aortic valve disease 07/10/2021   Dilated cardiomyopathy ([place] 12/14/2020   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   Orthostatic dizziness 11/14/2020   Educated about  COVID-19 virus infection 12/21/2019   Cognitive change 09/06/2019   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[place] 01/04/2019   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/04/2019   Preop cardiovascular exam 12/20/2018   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   [place] ([place] 03/28/2018   Arthralgia of hip or thigh 03/28/2018   Benign essential HTN 03/28/2018   Overweight 03/28/2018   Heart disease 11/09/2014   Leukocytosis 072/04/4708  Diastolic congestive heart failure ([place] 10/30/2014   Hypothyroidism 10/30/2014   Chest pain at rest 10/30/2014   Chest pain 10/30/2014   Endometrial cancer ([place] 01/30/2014   Dyspnea 01/11/2014   Edema 01/11/2014   Low back pain with right-sided sciatica 12/13/2013   Degeneration of lumbar or lumbosacral intervertebral disc 12/13/2013   Sciatica 12/13/2013    REFERRING DIAG:  R26.9 (ICD-10-CM) - Gait abnormality  R53.1 (ICD-10-CM) - Weakness  M25.561,M25.562,G89.29 (ICD-10-CM) - Chronic pain of both knees    THERAPY DIAG:  Unsteadiness on feet Muscle weakness (generalized)   Difficulty walking  PERTINENT HISTORY: TKA revision in 2020; L sided LBP; HTN; Graves Disease  PRECAUTIONS: None  SUBJECTIVE:  "Almost didn't come today because its ugly outside"  PAIN:  Are you having pain? No   Pain location: L/S or L LE when it does  occur Aggravating factors: sleeping on the wrong side, standing too long Relieving factors: resting   OBJECTIVE   DIAGNOSTIC FINDINGS:  IMPRESSION: No evidence of acute intracranial abnormality.   Mild-to-moderate generalized atrophy of the brain.   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   Mild paranasal sinus disease as described.   PATIENT SURVEYS:  FOTO unavailable   ABC Scale: 850 / 1600 = 53.1 %  ABC Scale 6th visit (2/6)=   510 / 1600 = 31.9 %   MUSCULOSKELETAL: Tremor: Absent Tone: Normal   Posture No gross abnormalities noted in standing or seated  posture; mild kyphosis and rounded shoulders, toe out position   Gait Distance walked: 27f Assistive device utilized: Single point cane Level of assistance: Modified independence Comments: widened BOS; shortened step length   Strength R/L 4/4 Hip flexion 4/4+ Hip abduction 4+/+ Hip adduction    4+/4+ Knee extension 5/5 Knee flexion 5/5 Ankle Plantarflexion 5/5 Ankle Dorsiflexion        FUNCTIONAL OUTCOME MEASURES    Eval   Results Comments  TUG 15 seconds Near LOB with L handed turn  5TSTS 20 seconds       09/08/21   Results Comments  TUG 13 seconds   5TSTS 21 seconds  decreased eccentric lowering strength     TODAY'S TREATMENT   09/19/21 Pt seen for aquatic therapy today.  Treatment took place in water 3.25-4.8 ft in [facility] Temp of water was 91.  Pt entered/exited the pool via stairs step to pattern and cgaindependently with bilat rail.  Reviewed current function, pain levels, response to prior Rx, and HEP compliance.    Walking using yellow noodle Fwd, bwd and side stepping 4 widths each.  Walking with 1 foam buoy submerged for core engagement x 2 widths fwd, back and side stepping  -pt walks continuously x 10 minutes without recovery period  Added 3 lb ankle weights:  Seated Cycling  3x20 Flutter kicking at hip 3x20 Add/abd 3 x20  STS submerged 75%.  Verbal and tactile cues for weight shift and execution. Exaggerated immediate standing balance x10+  Standing  -supported by wall and hand buoy: hip circles in hip flex and abd clockwise and counter  R/L ea x10  -mini squats on bottom water step 2x10 -SL squat R/L 2x10 bottom water step    Pt requires buoyancy for support and to offload joints with strengthening exercises. Viscosity of the water is needed for resistance of strengthening;  water current perturbations provides challenge to standing balance unsupported, requiring increased core activation.          PATIENT EDUCATION:  Education details: progression/exam findings, anatomy, exercise progression, balance deficits, muscle firing,  envelope of function, HEP, POC   Person educated: Patient and Spouse Education method: Explanation, Demonstration, Tactile cues, Verbal cues, and Handouts Education comprehension: verbalized understanding, returned demonstration, verbal cues required, tactile cues required, and needs further education 08/11/21 properties of water; benefits of aquatic therapy   HOME EXERCISE PROGRAM: Access Code: [email or web address] URL: [email or web address] Date: 08/05/2021 Prepared by: ADaleen Bo  ASSESSMENT:   CLINICAL IMPRESSION: Pt reports she feels therapy is helping her but unable to identify functionally how.  She is not having any pain, reports sleeping well and no falls. Working on [employer] today with pt having tendency to over balance posteriorly.  Similar difficulty with STS transfers completed submerged 75%.  She has improved with weight shifting forward when rising but struggling with her  immediate standing balance. She is improving with walking endurance tolerating 10 continuous minutes today    Pt reports good response from last session.  No pain.  Added 3 lb weights for added resistance/load with focus on proximal strengthening. CKC activity added as well as static balance. Mild fatigue upon completion today. Will continue to progress to improve function and safety.  Pt has min/moderate improvement with functional outcome measures at re-assessment. Pt reports improvement with comfort during standing balance but has decreased ABC scale score. Pt able to comfortably perform standing balance challenges with therapy nearby. However, she reports feeling uncomfortable without supervision- lacking confidence with dynamic activity. Plan to continue with aquatic therapy balance sessions with focus on increased loading of bilat LE. Pt's largest deficits are CKC LE  strength and proximal hip strength.    Objective impairments include Abnormal gait, decreased activity tolerance, decreased balance, decreased cognition, decreased coordination, decreased endurance, decreased mobility, difficulty walking, decreased ROM, decreased strength, dizziness, impaired flexibility, improper body mechanics, postural dysfunction, and pain. These impairments are limiting patient from cleaning, community activity, driving, laundry, yard work, shopping, and exercise . Personal factors including Age, Behavior pattern, Fitness, Time since onset of injury/illness/exacerbation, and 1-2 comorbidities:    are also affecting patient's functional outcome. Patient will benefit from skilled PT to address above impairments and improve overall function.   REHAB POTENTIAL: Fair     CLINICAL DECISION MAKING: Evolving/moderate complexity   EVALUATION COMPLEXITY: Moderate     GOALS:     SHORT TERM GOALS:   STG Name Target Date Goal status  1 Pt will become independent with HEP in order to demonstrate synthesis of PT education.   Baseline:  09/16/2021 ongoing  2 Pt will be able to demonstrate STS from chair without UE in order to demonstrate functional improvement in LE function for self-care and house hold duties.   09/16/2021 MET    LONG TERM GOALS:    LTG Name Target Date Goal status  1 Pt  will become independent with final HEP in order to demonstrate synthesis of PT education.   Baseline: 10/28/2021 ongoing  2 Pt will be able to perform 5XSTS in under 12s  in order to demonstrate functional improvement above the cut off score for adults.  Baseline: 10/28/2021 ongoing  3 Pt will be able to demonstrate TUG in under 10 sec in order to demonstrate functional improvement in LE function, strength, balance, and mobility for safety with community ambulation.   Baseline: 10/28/2021 ongoing  4 Pt will be able to demonstrate ability to ambulate with even stride length and shoulder/hip width  BOS without LOB in order to demonstrate functional improvement in balance and gait for self-care and house hold duties.   Baseline: 10/28/2021 Partially met    PLAN: PT FREQUENCY: 1-2x/week   PT DURATION: 12 weeks   PLANNED INTERVENTIONS: Therapeutic exercises, Therapeutic activity, Neuro Muscular re-education, Balance training, Gait training, Patient/Family education, Joint mobilization, Stair training, Vestibular training, Aquatic Therapy, Dry Needling, Electrical stimulation, Spinal mobilization, Cryotherapy, Moist heat, scar mobilization, Taping, Vasopneumatic device, Traction, Ultrasound, Ionotophoresis 64m/ml Dexamethasone, and Manual therapy   PLAN FOR NEXT SESSION: increase bilat loaded LE strengthening , single leg stability work, bHeritage manager(Frankie) Mahari Strahm MPT 09/19/21 1:16 PM

## 2021-09-23 ENCOUNTER — Encounter (HOSPITAL_BASED_OUTPATIENT_CLINIC_OR_DEPARTMENT_OTHER): Payer: Self-pay | Admitting: Physical Therapy

## 2021-09-23 ENCOUNTER — Other Ambulatory Visit: Payer: Self-pay

## 2021-09-23 ENCOUNTER — Ambulatory Visit (HOSPITAL_BASED_OUTPATIENT_CLINIC_OR_DEPARTMENT_OTHER): Payer: Medicare PPO | Admitting: Physical Therapy

## 2021-09-23 DIAGNOSIS — M6281 Muscle weakness (generalized): Secondary | ICD-10-CM

## 2021-09-23 DIAGNOSIS — R2681 Unsteadiness on feet: Secondary | ICD-10-CM

## 2021-09-23 DIAGNOSIS — R262 Difficulty in walking, not elsewhere classified: Secondary | ICD-10-CM

## 2021-09-23 NOTE — Therapy (Signed)
OUTPATIENT PHYSICAL THERAPY PROGRESS NOTE     Patient Name: Terri Ortega MRN: [id number] DOB:[birth year], 81 y.o., female Today's Date: 09/23/2021  PCP: Lawerance Cruel, MD REFERRING PROVIDER: Lawerance Cruel, MD   PT End of Session - 09/23/21 1134     Visit Number 9    Number of Visits 21    Date for PT Re-Evaluation 11/03/21    Authorization Type [employer] MCR    PT Start Time 1030    PT Stop Time 1100    PT Time Calculation (min) 30 min    Activity Tolerance Patient tolerated treatment well    Behavior During Therapy [facility] for tasks assessed/performed              Past Medical History:  Diagnosis Date   Graves disease    HTN (hypertension)    Low back pain    Dr. Ronnald Ramp   Osteoporosis    Pneumonia 2012   Radiation 03/20/14, 03/27/14, 04/05/14, 04/12/14, 04/17/14   bracytherapy to proximal vagina 30 gray   Scoliosis    Uterine cancer ([place])    surgery and radiation x 5 treatments-last 9'15   Vitamin D deficiency    Past Surgical History:  Procedure Laterality Date   BUNIONECTOMY     left   CATARACT EXTRACTION, BILATERAL Bilateral    last done 11-21-14   COLONOSCOPY WITH PROPOFOL N/A 12/25/2014   Procedure: COLONOSCOPY WITH PROPOFOL;  Surgeon: Garlan Fair, MD;  Location: [facility];  Service: Endoscopy;  Laterality: N/A;   FOOT SURGERY Left 2004   Dr Johnnye Sima   JOINT REPLACEMENT     RTKA   ROBOTIC ASSISTED TOTAL HYSTERECTOMY WITH BILATERAL SALPINGO OOPHERECTOMY  01/30/14   with lymph node biopsy   TOTAL KNEE ARTHROPLASTY     right   TOTAL KNEE REVISION Right 01/04/2019   Procedure: TOTAL KNEE REVISION;  Surgeon: Gaynelle Arabian, MD;  Location: [facility];  Service: Orthopedics;  Laterality: Right;  167mn with block   Patient Active Problem List   Diagnosis Date Noted   Aortic valve disease 07/10/2021   [place]ilated [place] 12/14/2020   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   Orthostatic dizziness 11/14/2020   Educated about  COVID-19 virus infection 12/21/2019   Cognitive change 09/06/2019   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[place] 01/04/2019   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 01/04/2019   Preop cardiovascular exam 12/20/2018   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   [place] 03/28/2018   Arthralgia of hip or thigh 03/28/2018   Benign essential HTN 03/28/2018   Overweight 03/28/2018   Heart disease 11/09/2014   Leukocytosis 002/54/2706  Diastolic congestive heart failure ([place] 10/30/2014   Hypothyroidism 10/30/2014   Chest pain at rest 10/30/2014   Chest pain 10/30/2014   [place]ometrial cancer ([place] 01/30/2014   Dyspnea 01/11/2014   Edema 01/11/2014   Low back pain with right-sided sciatica 12/13/2013   Degeneration of lumbar or lumbosacral intervertebral disc 12/13/2013   Sciatica 12/13/2013    REFERRING DIAG:  R26.9 (ICD-10-CM) - Gait abnormality  R53.1 (ICD-10-CM) - Weakness  M25.561,M25.562,G89.29 (ICD-10-CM) - Chronic pain of both knees    THERAPY DIAG:  Unsteadiness on feet Muscle weakness (generalized)   Difficulty walking  PERTINENT HISTORY: TKA revision in 2020; L sided LBP; HTN; Graves Disease  PRECAUTIONS: None  SUBJECTIVE:  "Got lazy this morning that's why I'm late"  PAIN:  Are you having pain? No   Pain location: L/S or L LE when it does  occur Aggravating factors: sleeping on the wrong side, standing too long Relieving factors: resting   OBJECTIVE   DIAGNOSTIC FINDINGS:  IMPRESSION: No evidence of acute intracranial abnormality.   Mild-to-moderate generalized atrophy of the brain.   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   Mild paranasal sinus disease as described.   PATIENT SURVEYS:  FOTO unavailable   ABC Scale: 850 / 1600 = 53.1 %  ABC Scale 6th visit (2/6)=   510 / 1600 = 31.9 %   MUSCULOSKELETAL: Tremor: Absent Tone: Normal   Posture No gross abnormalities noted in standing or seated posture;  mild kyphosis and rounded shoulders, toe out position   Gait Distance walked: 104f Assistive device utilized: Single point cane Level of assistance: Modified independence Comments: widened BOS; shortened step length   Strength R/L 4/4 Hip flexion 4/4+ Hip abduction 4+/+ Hip adduction    4+/4+ Knee extension 5/5 Knee flexion 5/5 Ankle Plantarflexion 5/5 Ankle Dorsiflexion        FUNCTIONAL OUTCOME MEASURES    Eval   Results Comments  TUG 15 seconds Near LOB with L handed turn  5TSTS 20 seconds       09/08/21   Results Comments  TUG 13 seconds   5TSTS 21 seconds  decreased eccentric lowering strength     TODAY'S TREATMENT   09/23/21 Pt seen for aquatic therapy today.  Treatment took place in water 3.25-4.[facility] ft [facility] Temp of water was 91.  Pt entered/exited the pool via stairs step to pattern and cgaindependently with bilat rail.  Reviewed current function, pain levels, response to prior Rx, and HEP compliance.    Walking using yellow noodle Fwd, bwd and side stepping 4 widths each.  Walking with 1 foam buoy submerged for core engagement x 2 widths fwd, back and side stepping     Added 3 lb ankle weights:  Seated Knee flex/ext (LAQ) alternated with ad/abd 3x20   Standing  -supported by wall and hand buoy: hip circles in hip flex and abd clockwise and counter  R/L ea x10  - df;pf supported my noodle x15 -mini squats in 3 ft x10 -SL squat R/L 2x10 bottom water step. Cues for improved eccentric control    Pt requires buoyancy for support and to offload joints with strengthening exercises. Viscosity of the water is needed for resistance of strengthening;  water current perturbations provides challenge to standing balance unsupported, requiring increased core activation.         PATIENT EDUCATION:  Education details: progression/exam findings, anatomy, exercise progression, balance deficits, muscle firing,  envelope of  function, HEP, POC   Person educated: Patient and Spouse Education method: Explanation, Demonstration, Tactile cues, Verbal cues, and Handouts Education comprehension: verbalized understanding, returned demonstration, verbal cues required, tactile cues required, and needs further education 08/11/21 properties of water; benefits of aquatic therapy   HOME EXERCISE PROGRAM: Access Code: [email or web address] URL: [email or web address] Date: 08/05/2021 Prepared by: ADaleen Bo  ASSESSMENT:   CLINICAL IMPRESSION:  Pt late for appointment limiting time. She reports no adverse response from last visit other than a little tiredness. Continue to use 3lb ankle weights for added resistance. Advanced SL stability and eccentric control. Progressing with her understanding importance of controlling standing to sitting and lowering herself down on step rather than dropping. Needed further instruction on forward weight shifting to gain immediate standing balance without LOB backward.   Pt has min/moderate improvement with functional outcome measures at re-assessment. Pt  reports improvement with comfort during standing balance but has decreased ABC scale score. Pt able to comfortably perform standing balance challenges with therapy nearby. However, she reports feeling uncomfortable without supervision- lacking confidence with dynamic activity. Plan to continue with aquatic therapy balance sessions with focus on increased loading of bilat LE. Pt's largest deficits are CKC LE strength and proximal hip strength.    Objective impairments include Abnormal gait, decreased activity tolerance, decreased balance, decreased cognition, decreased coordination, decreased endurance, decreased mobility, difficulty walking, decreased ROM, decreased strength, dizziness, impaired flexibility, improper body mechanics, postural dysfunction, and pain. These impairments are limiting patient from cleaning, community activity, driving,  laundry, yard work, shopping, and exercise . Personal factors including Age, Behavior pattern, Fitness, Time since onset of injury/illness/exacerbation, and 1-2 comorbidities:    are also affecting patient's functional outcome. Patient will benefit from skilled PT to address above impairments and improve overall function.   REHAB POTENTIAL: Fair     CLINICAL DECISION MAKING: Evolving/moderate complexity   EVALUATION COMPLEXITY: Moderate     GOALS:     SHORT TERM GOALS:   STG Name Target Date Goal status  1 Pt will become independent with HEP in order to demonstrate synthesis of PT education.   Baseline:  09/16/2021 ongoing  2 Pt will be able to demonstrate STS from chair without UE in order to demonstrate functional improvement in LE function for self-care and house hold duties.   09/16/2021 MET    LONG TERM GOALS:    LTG Name Target Date Goal status  1 Pt  will become independent with final HEP in order to demonstrate synthesis of PT education.   Baseline: 10/28/2021 ongoing  2 Pt will be able to perform 5XSTS in under 12s  in order to demonstrate functional improvement above the cut off score for adults.  Baseline: 10/28/2021 ongoing  3 Pt will be able to demonstrate TUG in under 10 sec in order to demonstrate functional improvement in LE function, strength, balance, and mobility for safety with community ambulation.   Baseline: 10/28/2021 ongoing  4 Pt will be able to demonstrate ability to ambulate with even stride length and shoulder/hip width BOS without LOB in order to demonstrate functional improvement in balance and gait for self-care and house hold duties.   Baseline: 10/28/2021 Partially met    PLAN: PT FREQUENCY: 1-2x/week   PT DURATION: 12 weeks   PLANNED INTERVENTIONS: Therapeutic exercises, Therapeutic activity, Neuro Muscular re-education, Balance training, Gait training, Patient/Family education, Joint mobilization, Stair training, Vestibular training, Aquatic  Therapy, Dry Needling, Electrical stimulation, Spinal mobilization, Cryotherapy, Moist heat, scar mobilization, Taping, Vasopneumatic device, Traction, Ultrasound, Ionotophoresis 17m/ml Dexamethasone, and Manual therapy   PLAN FOR NEXT SESSION: increase bilat loaded LE strengthening , single leg stability work, bHeritage manager(Frankie) Hesham Womac MPT 09/23/21 1:21 PM

## 2021-09-25 ENCOUNTER — Encounter (HOSPITAL_BASED_OUTPATIENT_CLINIC_OR_DEPARTMENT_OTHER): Payer: Self-pay | Admitting: Physical Therapy

## 2021-09-25 ENCOUNTER — Other Ambulatory Visit: Payer: Self-pay

## 2021-09-25 ENCOUNTER — Ambulatory Visit (HOSPITAL_BASED_OUTPATIENT_CLINIC_OR_DEPARTMENT_OTHER): Payer: Medicare PPO | Admitting: Physical Therapy

## 2021-09-25 DIAGNOSIS — M6281 Muscle weakness (generalized): Secondary | ICD-10-CM | POA: Diagnosis not present

## 2021-09-25 DIAGNOSIS — R2681 Unsteadiness on feet: Secondary | ICD-10-CM | POA: Diagnosis not present

## 2021-09-25 DIAGNOSIS — R262 Difficulty in walking, not elsewhere classified: Secondary | ICD-10-CM | POA: Diagnosis not present

## 2021-09-25 NOTE — Therapy (Signed)
OUTPATIENT PHYSICAL THERAPY PROGRESS NOTE  Progress Note Reporting Period 08/05/21 to 09/25/21  See note below for Objective Data and Assessment of Progress/Goals.       Patient Name: Terri Ortega MRN: [id number] DOB:[birth year], 81 y.o., female Today's Date: 09/25/2021  PCP: Lawerance Cruel, MD REFERRING PROVIDER: Lawerance Cruel, MD   PT End of Session - 09/25/21 1055     Visit Number 10    Number of Visits 21    Date for PT Re-Evaluation 11/03/21    Authorization Type [employer] MCR    PT Start Time 3545    PT Stop Time 1117    PT Time Calculation (min) 42 min              Past Medical History:  Diagnosis Date   Graves disease    HTN (hypertension)    Low back pain    Dr. Ronnald Ramp   Osteoporosis    Pneumonia 2012   Radiation 03/20/14, 03/27/14, 04/05/14, 04/12/14, 04/17/14   bracytherapy to proximal vagina 30 gray   Scoliosis    Uterine cancer ([place])    surgery and radiation x 5 treatments-last 9'15   Vitamin D deficiency    Past Surgical History:  Procedure Laterality Date   BUNIONECTOMY     left   CATARACT EXTRACTION, BILATERAL Bilateral    last done 11-21-14   COLONOSCOPY WITH PROPOFOL N/A 12/25/2014   Procedure: COLONOSCOPY WITH PROPOFOL;  Surgeon: Garlan Fair, MD;  [facility];  Service: Endoscopy;  Laterality: N/A;   FOOT SURGERY Left 2004   Dr Johnnye Sima   JOINT REPLACEMENT     RTKA   ROBOTIC ASSISTED TOTAL HYSTERECTOMY WITH BILATERAL SALPINGO OOPHERECTOMY  01/30/14   with lymph node biopsy   TOTAL KNEE ARTHROPLASTY     right   TOTAL KNEE REVISION Right 01/04/2019   Procedure: TOTAL KNEE REVISION;  Surgeon: Gaynelle Arabian, MD;  Location[facility];  Service: Orthopedics;  Laterality: Right;  115mn with block   Patient Active Problem List   Diagnosis Date Noted   Aortic valve disease 07/10/2021   [place]ilated [place] 12/14/2020   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   Orthostatic dizziness 11/14/2020    Educated about COVID-19 virus infection 12/21/2019   Cognitive change 09/06/2019   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[place] 01/04/2019   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 01/04/2019   Preop cardiovascular exam 12/20/2018   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   [place] cancer ([place] 03/28/2018   Arthralgia of hip or thigh 03/28/2018   Benign essential HTN 03/28/2018   Overweight 03/28/2018   Heart disease 11/09/2014   Leukocytosis 062/56/3893  Diastolic congestive heart failure ([place] 10/30/2014   Hypothyroidism 10/30/2014   Chest pain at rest 10/30/2014   Chest pain 10/30/2014   [place]ometrial [place] 01/30/2014   Dyspnea 01/11/2014   Edema 01/11/2014   Low back pain with right-sided sciatica 12/13/2013   Degeneration of lumbar or lumbosacral intervertebral disc 12/13/2013   Sciatica 12/13/2013    REFERRING DIAG:  R26.9 (ICD-10-CM) - Gait abnormality  R53.1 (ICD-10-CM) - Weakness  M25.561,M25.562,G89.29 (ICD-10-CM) - Chronic pain of both knees    THERAPY DIAG:  Unsteadiness on feet Muscle weakness (generalized)   Difficulty walking  PERTINENT HISTORY: TKA revision in 2020; L sided LBP; HTN; Graves Disease  PRECAUTIONS: None  SUBJECTIVE:  "I'm doing alright"  PAIN:  Are you having pain? No   Pain location: L/S or L LE when it does occur Aggravating factors:  sleeping on the wrong side, standing too long Relieving factors: resting   OBJECTIVE   DIAGNOSTIC FINDINGS:  IMPRESSION: No evidence of acute intracranial abnormality.   Mild-to-moderate generalized atrophy of the brain.   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   Mild paranasal sinus disease as described.   PATIENT SURVEYS:  FOTO unavailable   ABC Scale: 850 / 1600 = 53.1 %  ABC Scale 6th visit (2/6)=   510 / 1600 = 31.9 %   MUSCULOSKELETAL: Tremor: Absent Tone: Normal   Posture No gross abnormalities noted in standing or seated posture; mild  kyphosis and rounded shoulders, toe out position   Gait Distance walked: 38ft Assistive device utilized: Single point cane Level of assistance: Modified independence Comments: widened BOS; shortened step length   Strength R/L 4/4 Hip flexion 4/4+ Hip abduction 4+/+ Hip adduction    4+/4+ Knee extension 5/5 Knee flexion 5/5 Ankle Plantarflexion 5/5 Ankle Dorsiflexion        FUNCTIONAL OUTCOME MEASURES    Eval   Results Comments  TUG 15 seconds Near LOB with L handed turn  5TSTS 20 seconds       09/08/21   Results Comments  TUG 13 seconds   5TSTS 21 seconds  decreased eccentric lowering strength   09/25/21   Results Comments  TUG 15 seconds Completed with cane. Near LOB  after turn.  5TSTS 21 seconds  decreased eccentric lowering strength     TODAY'S TREATMENT   09/23/21 Pt seen for aquatic therapy today.  Treatment took place in water 3.25-4.8 ft in depth at the [employer] pool. Temp of water was 91.  Pt entered/exited the pool via stairs step to pattern and cgaindependently with bilat rail.  Reviewed current function, pain levels, response to prior Rx, and HEP compliance.    Walking using yellow noodle Fwd, bwd and side stepping 4 widths each.  Walking with 1 foam buoy submerged for core engagement x 2 widths fwd, back and side stepping      3 lb ankle weights:  Seated Knee flex/ext (LAQ) alternated with abd/abd 3x20  Balance Small BOS 3 ft, with1 foam hand buoys shoulder add/abd 2x10 Tandem shoulder with add/abd Right leading x10, Left leading 2x5 SLS R/L    Standing  -supported by 2 fingers and hand buoy: hip circles in hip flex and abd clockwise and counter  R/L ea x10  -mini squats in 2 ft x10 bottom step -SL squat R/L 2x10 bottom water step.     Pt requires buoyancy for support and to offload joints with strengthening exercises. Viscosity of the water is needed for resistance of strengthening;  water current perturbations provides  challenge to standing balance unsupported, requiring increased core activation.         PATIENT EDUCATION:  Education details: progression/exam findings, anatomy, exercise progression, balance deficits, muscle firing,  envelope of function, HEP, POC   Person educated: Patient and Spouse Education method: Explanation, Demonstration, Tactile cues, Verbal cues, and Handouts Education comprehension: verbalized understanding, returned demonstration, verbal cues required, tactile cues required, and needs further education 08/11/21 properties of water; benefits of aquatic therapy   HOME EXERCISE PROGRAM: Access Code: [email or web address] URL: [email or web address] Date: 08/05/2021 Prepared by: Daleen Bo   ASSESSMENT:   CLINICAL IMPRESSION:  PN completed today. Pt continues to make progress although slow.  Have increased her LE loading with ankle weights donned throughout aquatic sessions. Her TUG and 5 x STS tests results fluctuating. She is demonstrating progress with  HEP which she is indep with, gait improving with even stride lengths but continues to have a wider BOS requiring cane at all times. She is able to rise from chairs without UE support as evidenced of improving strength although with ue use pt completes with increased safety particularly with immediate standing balance. Standing balance improving with aquatic intervention practicing in protected environment, pt less fearful of falling.  Will continue with aquatic intervention with focus on increasing loading of bialt LE's and proximal hip strength    Objective impairments include Abnormal gait, decreased activity tolerance, decreased balance, decreased cognition, decreased coordination, decreased endurance, decreased mobility, difficulty walking, decreased ROM, decreased strength, dizziness, impaired flexibility, improper body mechanics, postural dysfunction, and pain. These impairments are limiting patient from cleaning, community  activity, driving, laundry, yard work, shopping, and exercise . Personal factors including Age, Behavior pattern, Fitness, Time since onset of injury/illness/exacerbation, and 1-2 comorbidities:    are also affecting patient's functional outcome. Patient will benefit from skilled PT to address above impairments and improve overall function.   REHAB POTENTIAL: Fair     CLINICAL DECISION MAKING: Evolving/moderate complexity   EVALUATION COMPLEXITY: Moderate     GOALS:     SHORT TERM GOALS:   STG Name Target Date Goal status  1 Pt will become independent with HEP in order to demonstrate synthesis of PT education.   Baseline:  09/16/2021 09/25/21 Ongoing Achieved  2 Pt will be able to demonstrate STS from chair without UE in order to demonstrate functional improvement in LE function for self-care and house hold duties.   09/16/2021  MET     LONG TERM GOALS:    LTG Name Target Date Goal status  1 Pt  will become independent with final HEP in order to demonstrate synthesis of PT education.   Baseline: 10/28/2021 ongoing  2 Pt will be able to perform 5XSTS in under 12s  in order to demonstrate functional improvement above the cut off score for adults.  Baseline: 10/28/2021 ongoing  3 Pt will be able to demonstrate TUG in under 10 sec in order to demonstrate functional improvement in LE function, strength, balance, and mobility for safety with community ambulation.   Baseline: 10/28/2021 ongoing  4 Pt will be able to demonstrate ability to ambulate with even stride length and shoulder/hip width BOS without LOB in order to demonstrate functional improvement in balance and gait for self-care and house hold duties.   Baseline: 10/28/2021 Partially met    PLAN: PT FREQUENCY: 1-2x/week   PT DURATION: 12 weeks   PLANNED INTERVENTIONS: Therapeutic exercises, Therapeutic activity, Neuro Muscular re-education, Balance training, Gait training, Patient/Family education, Joint mobilization, Stair  training, Vestibular training, Aquatic Therapy, Dry Needling, Electrical stimulation, Spinal mobilization, Cryotherapy, Moist heat, scar mobilization, Taping, Vasopneumatic device, Traction, Ultrasound, Ionotophoresis 51m/ml Dexamethasone, and Manual therapy   PLAN FOR NEXT SESSION: increase bilat loaded LE strengthening , single leg stability work, bHeritage manager(Frankie) Shine Mikes MPT 09/25/21 1:51 PM

## 2021-09-29 ENCOUNTER — Ambulatory Visit (HOSPITAL_BASED_OUTPATIENT_CLINIC_OR_DEPARTMENT_OTHER): Payer: Medicare PPO | Admitting: Physical Therapy

## 2021-09-29 ENCOUNTER — Other Ambulatory Visit: Payer: Self-pay

## 2021-09-29 ENCOUNTER — Encounter (HOSPITAL_BASED_OUTPATIENT_CLINIC_OR_DEPARTMENT_OTHER): Payer: Self-pay | Admitting: Physical Therapy

## 2021-09-29 DIAGNOSIS — M6281 Muscle weakness (generalized): Secondary | ICD-10-CM | POA: Diagnosis not present

## 2021-09-29 DIAGNOSIS — R2681 Unsteadiness on feet: Secondary | ICD-10-CM | POA: Diagnosis not present

## 2021-09-29 DIAGNOSIS — R262 Difficulty in walking, not elsewhere classified: Secondary | ICD-10-CM | POA: Diagnosis not present

## 2021-09-29 NOTE — Therapy (Signed)
OUTPATIENT PHYSICAL THERAPY PROGRESS NOTE    PHYSICAL THERAPY DISCHARGE SUMMARY  Visits from Start of Care: 08/05/21  Current functional level related to goals / functional outcomes: Amb with assistive device, indep with all ADL's and functional mobility   Remaining deficits: Balance deficits   Education / Equipment: HEP   Patient agrees to discharge. Patient goals were partially met. Patient is being discharged due to being pleased with the current functional level.      Patient Name: Terri Ortega MRN: [id number] DOB:[birth year], 81 y.o., female Today's Date: 09/29/2021  PCP: Lawerance Cruel, MD REFERRING PROVIDER: Lawerance Cruel, MD   PT End of Session - 09/29/21 1044     Visit Number 11    Number of Visits 21    Date for PT Re-Evaluation 11/03/21    Authorization Type [employer] MCR    PT Start Time 1031    PT Stop Time 1114    PT Time Calculation (min) 43 min    Activity Tolerance Patient tolerated treatment well    Behavior During Therapy [facility] for tasks assessed/performed              Past Medical History:  Diagnosis Date   Graves disease    HTN (hypertension)    Low back pain    Dr. Ronnald Ramp   Osteoporosis    Pneumonia 2012   Radiation 03/20/14, 03/27/14, 04/05/14, 04/12/14, 04/17/14   bracytherapy to proximal vagina 30 gray   Scoliosis    Uterine cancer ([place])    surgery and radiation x 5 treatments-last 9'15   Vitamin D deficiency    Past Surgical History:  Procedure Laterality Date   BUNIONECTOMY     left   CATARACT EXTRACTION, BILATERAL Bilateral    last done 11-21-14   COLONOSCOPY WITH PROPOFOL N/A 12/25/2014   Procedure: COLONOSCOPY WITH PROPOFOL;  Surgeon: Garlan Fair, MD;  Location: [facility];  Service: Endoscopy;  Laterality: N/A;   FOOT SURGERY Left 2004   Dr Johnnye Sima   JOINT REPLACEMENT     RTKA   ROBOTIC ASSISTED TOTAL HYSTERECTOMY WITH BILATERAL SALPINGO OOPHERECTOMY  01/30/14   with lymph node biopsy   TOTAL KNEE  ARTHROPLASTY     right   TOTAL KNEE REVISION Right 01/04/2019   Procedure: TOTAL KNEE REVISION;  Surgeon: Gaynelle Arabian, MD;  Location: [facility];  Service: Orthopedics;  Laterality: Right;  151mn with block   Patient Active Problem List   Diagnosis Date Noted   Aortic valve disease 07/10/2021   Dilated cardiomyopathy ([place] 12/14/2020   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   Orthostatic dizziness 11/14/2020   Educated about COVID-19 virus infection 12/21/2019   Cognitive change 09/06/2019   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[place] 01/04/2019   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 ([place] 01/04/2019   Preop cardiovascular exam 12/20/2018   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   [place]ter[place] cancer ([place] 03/28/2018   Arthralgia of hip or thigh 03/28/2018   Benign essential HTN 03/28/2018   Overweight 03/28/2018   Heart disease 11/09/2014   Leukocytosis 091/50/5697  Diastolic congestive heart failure ([place] 10/30/2014   Hypothyroidism 10/30/2014   Chest pain at rest 10/30/2014   Chest pain 10/30/2014   [place]ometrial cancer [place] 01/30/2014   Dyspnea 01/11/2014   Edema 01/11/2014   Low back pain with right-sided sciatica 12/13/2013   Degeneration of lumbar or lumbosacral intervertebral disc 12/13/2013   Sciatica 12/13/2013    REFERRING DIAG:  R26.9 (ICD-10-CM) - Gait abnormality  R53.1 (ICD-10-CM) -  Weakness  M25.561,M25.562,G89.29 (ICD-10-CM) - Chronic pain of both knees    THERAPY DIAG:  Unsteadiness on feet Muscle weakness (generalized)   Difficulty walking  PERTINENT HISTORY: TKA revision in 2020; L sided LBP; HTN; Graves Disease  PRECAUTIONS: None  SUBJECTIVE:  "This is my last visit" Pt reports she wants to finish with therapy.  Feels she has done well and gotten out of it what she wanted.  PAIN:  Are you having pain? No   Pain location: L/S or L LE when it does occur Aggravating factors: sleeping on the wrong side, standing too long Relieving  factors: resting   OBJECTIVE   DIAGNOSTIC FINDINGS:  IMPRESSION: No evidence of acute intracranial abnormality.   Mild-to-moderate generalized atrophy of the brain.   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   Mild paranasal sinus disease as described.   PATIENT SURVEYS:  FOTO unavailable   ABC Scale: 850 / 1600 = 53.1 %  ABC Scale 6th visit (2/6)=   510 / 1600 = 31.9 %   MUSCULOSKELETAL: Tremor: Absent Tone: Normal   Posture No gross abnormalities noted in standing or seated posture; mild kyphosis and rounded shoulders, toe out position   Gait Distance walked: 76f Assistive device utilized: Single point cane Level of assistance: Modified independence Comments: widened BOS; shortened step length   Strength R/L 4/4 Hip flexion 4/4+ Hip abduction 4+/+ Hip adduction    4+/4+ Knee extension 5/5 Knee flexion 5/5 Ankle Plantarflexion 5/5 Ankle Dorsiflexion        FUNCTIONAL OUTCOME MEASURES    Eval   Results Comments  TUG 15 seconds Near LOB with L handed turn  5TSTS 20 seconds       09/08/21   Results Comments  TUG 13 seconds   5TSTS 21 seconds  decreased eccentric lowering strength   09/25/21   Results Comments  TUG 15 seconds Completed with cane. Near LOB  after turn.  5TSTS 21 seconds  decreased eccentric lowering strength     TODAY'S TREATMENT   09/29/21 Pt seen for aquatic therapy today.  Treatment took place in water 3.25-4.8 ft [facility] Temp of water was 91.  Pt entered/exited the pool via stairs step to pattern and cgaindependently with bilat rail.  Reviewed current function, pain levels, response to prior Rx, and HEP compliance.    Walking using yellow noodle Fwd, bwd and side stepping 4 widths each.  Walking with 1 foam buoy submerged for core engagement x 2 widths fwd, back and side stepping      3 lb ankle weights:  Seated Knee flex/ext (LAQ) alternated with abd/abd  3x20  Balance Small BOS 3 ft, with1 foam hand buoys shoulder add/abd 2x15 Tandem stance R/L x 15s + SLS R/L with 1 foam hand buoys x20s without LOB  Standing  -Hip flex; hip ext; add/abd 2x10;  -hip circles clockwise and counter x15 -mini squats in 2 ft x10 bottom step -SL squat R/L 2x10 bottom water step.   Pt requires buoyancy for support and to offload joints with strengthening exercises. Viscosity of the water is needed for resistance of strengthening;  water current perturbations provides challenge to standing balance unsupported, requiring increased core activation.         PATIENT EDUCATION:  Education details: progression/exam findings, anatomy, exercise progression, balance deficits, muscle firing,  envelope of function, HEP, POC   Person educated: Patient and Spouse Education method: Explanation, Demonstration, Tactile cues, Verbal cues, and Handouts  Education comprehension: verbalized understanding, returned demonstration, verbal cues required, tactile cues required, and needs further education 08/11/21 properties of water; benefits of aquatic therapy   HOME EXERCISE PROGRAM: Access Code: [email or web address] URL: [email or web address] Date: 08/05/2021 Prepared by: Daleen Bo   ASSESSMENT:   CLINICAL IMPRESSION:  Pt requesting DC.  States she is satisfied with her progress to this point and feels like she wants to be done.  She has had no falls. As of her last re-assessment completed last visit she has improved in many aeas but has not met all goals.  She is uninterested to continue with therapy in pool or on land as suggested. She is instructed to continue with her HEP and call with any questions.     Objective impairments include Abnormal gait, decreased activity tolerance, decreased balance, decreased cognition, decreased coordination, decreased endurance, decreased mobility, difficulty walking, decreased ROM, decreased strength, dizziness, impaired flexibility,  improper body mechanics, postural dysfunction, and pain. These impairments are limiting patient from cleaning, community activity, driving, laundry, yard work, shopping, and exercise . Personal factors including Age, Behavior pattern, Fitness, Time since onset of injury/illness/exacerbation, and 1-2 comorbidities:    are also affecting patient's functional outcome. Patient will benefit from skilled PT to address above impairments and improve overall function.   REHAB POTENTIAL: Fair     CLINICAL DECISION MAKING: Evolving/moderate complexity   EVALUATION COMPLEXITY: Moderate     GOALS:     SHORT TERM GOALS:   STG Name Target Date Goal status  1 Pt will become independent with HEP in order to demonstrate synthesis of PT education.   Baseline:  09/16/2021 09/25/21 Ongoing Achieved  2 Pt will be able to demonstrate STS from chair without UE in order to demonstrate functional improvement in LE function for self-care and house hold duties.   09/16/2021  MET     LONG TERM GOALS:    LTG Name Target Date Goal status  1 Pt  will become independent with final HEP in order to demonstrate synthesis of PT education.   Baseline: 10/28/2021 ongoing  2 Pt will be able to perform 5XSTS in under 12s  in order to demonstrate functional improvement above the cut off score for adults.  Baseline: 10/28/2021 ongoing  3 Pt will be able to demonstrate TUG in under 10 sec in order to demonstrate functional improvement in LE function, strength, balance, and mobility for safety with community ambulation.   Baseline: 10/28/2021 ongoing  4 Pt will be able to demonstrate ability to ambulate with even stride length and shoulder/hip width BOS without LOB in order to demonstrate functional improvement in balance and gait for self-care and house hold duties.   Baseline: 10/28/2021 Partially met    PLAN: PT FREQUENCY: 1-2x/week   PT DURATION: 12 weeks   PLANNED INTERVENTIONS: Therapeutic exercises, Therapeutic  activity, Neuro Muscular re-education, Balance training, Gait training, Patient/Family education, Joint mobilization, Stair training, Vestibular training, Aquatic Therapy, Dry Needling, Electrical stimulation, Spinal mobilization, Cryotherapy, Moist heat, scar mobilization, Taping, Vasopneumatic device, Traction, Ultrasound, Ionotophoresis 29m/ml Dexamethasone, and Manual therapy   PLAN FOR NEXT SESSION: increase bilat loaded LE strengthening , single leg stability work, bHeritage manager(Frankie) Noell Shular MPT 09/29/21 2:26 PM

## 2021-10-02 ENCOUNTER — Ambulatory Visit (HOSPITAL_BASED_OUTPATIENT_CLINIC_OR_DEPARTMENT_OTHER): Payer: Medicare PPO | Admitting: Physical Therapy

## 2021-10-06 ENCOUNTER — Ambulatory Visit: Payer: Medicare PPO | Admitting: Neurology

## 2021-10-06 ENCOUNTER — Encounter: Payer: Self-pay | Admitting: Neurology

## 2021-10-06 VITALS — BP 146/62 | HR 65 | Ht 65.0 in | Wt 177.4 lb

## 2021-10-06 DIAGNOSIS — G3184 Mild cognitive impairment, so stated: Secondary | ICD-10-CM

## 2021-10-06 NOTE — Progress Notes (Signed)
[facility]    Provider:  Dr Jaynee Eagles Requesting Provider: Lawerance Cruel, MD Primary Care Provider:  Lawerance Cruel, MD  CC:  cognitive complaints and dizziness  10/06/2020: Patient here with husband and grandson today for follow up requested for memory loss. Daughter is on the phone. She is on Aricept. There is depression. She is snippy. She is blue. Everything is been about the same. MMSE has remained stable over years. Driving is fine, no accidents. Family here thinks memory is fine. She goes to bed about 10 and wakes up at 7am. She feels refreshed when she gets up. She naps during the day. She is getting out, she doesn't go shopping, she goes out to eat with her husband, she would like to spend more time with the great grandchildren. She does not feel she is sad most days. She declines bing irritable. Husband says "she is not so bad". They have to talk louder because of hearing. No falls. No swallowing problems. Appetite is good. In general life is about the same. She declines anymore memory testing. Family does not report anything else. Her daughter is concerned about irritability and starting Lexapro but patient declines.   Patient complains of symptoms per HPI as well as the following symptoms: none . Pertinent negatives and positives per HPI. All others negative   Per Dr. Sima Matas: 11/04/2020 2 PM-3 PM: Today's visit was an in person visit that was conducted in my outpatient clinic office with the patient and her husband present.  We reviewed the results of the recent neuropsychological evaluation which were quite encouraging and showed no indication of any progressive type of dementia process and is clearly not consistent with a progressive dementia such as Alzheimer's or Lewy body type of condition  05/23/2021: Her dizziness is better. She is unsure of her stepping. She is still driving. She drove to [place]. She declines driving test. She feels she is imbalanced.  It is aggravating, daughter feels she did not follow through with PT and feels the imbalance is disuse. Discussed [facility], will refer to [facility] for aquatherapy.   HPI 11/12/2020:  Terri Ortega is a 81 y.o. female here as requested by [name], MD for dizziness. PMHx hypothyroidism, hypertension, obstructive sleep apnea (borderline not needing CPAP), diastolic congestive heart failure, osteoporosis, remote uterine cancer s/p hysterectomy 2015, memory difficulties and had a thorough evaluation including formal memory testing which was not consistent with a neurodegenerative disorder at this time.  She feels dizzy, here with her daughter, less than 6 months ago, she got out of bed ane morning and she fell, she didn't bump her head, she did not pass out, she felt dizzy. In the last 2 weeks she is comfortable because she is lightheaded, dizzy, the room may spin or she feels like there is movement when there is not, always when standing or walking, worse when she first gets up in the morning, she is not following instructions or wearing her compression stockings. She doesn't drink a lot of fluids, she had a sleep evaluation. No neck pain. Daughter thinks she may have some depression, we discussed that today, They don;t want to [place], we can monitor. No other focal neurologic deficits, associated symptoms, inciting events or modifiable factors.  I reviewed Dr. Lina Sar new notes, patient transiently with change of position dizziness, orthostatics negative, they stopped her diuretics, she was encouraged to wear support stockings, push fluids, CT of the head was negative for any acute pathology, referred  back to neurology for further evaluation of dizziness.  At last appointment in September 09, 2020 she still complained of some dizziness with change of position, she did sustain a fall because of dizziness, again CT of her head was negative for any acute pathology, she did voice concern for not  eating as much and not drinking as much fluids therefore they stopped her diuretics, she was complaining of feeling dizzy when going from sitting to lying, lying to sitting, otherwise neuro exam was nonfocal.  Patient complains of symptoms per HPI as well as the following symptoms: dizziness, memory difficulty . Pertinent negatives and positives per HPI. All others negative  Interval history Dec 25, 2019: Here with husband and daughter to review work-up.  Reviewed MRI of the brain images as below, also reviewed findings of formal memory testing which were not consistent with degenerative dementia such as Alzheimer's, Lewy body or other cortical dementia, there was some pattern of lateralization to performance with almost all her deficits in the areas of visual processing and visual cognitive functioning, it could be very early in picking up some cognitive changes they will retest her in 9 months.  We offered her a sleep test due to her snoring and difficulty with achieving good restful sleep but she declined.  No significant depression or anxiety.  Answered all questions.  1.   Mild to moderate generalized cortical atrophy. 2.   T2/FLAIR hyperintense foci in the hemispheres consistent with moderate chronic microvascular ischemic change. 3.   Mild right chronic maxillary sinusitis. 4.   There were no acute findings and there is a normal enhancement pattern.  HPI:  Terri Ortega is a 81 y.o. female here as requested by Lawerance Cruel, MD for memory loss. PMHx high blood pressure, uterine cancer, osteoporosis, hypothyroidism, memory difficulties, diastolic congestive heart failure, overweight.  I reviewed notes from Dr. Delfina Redwood, B12 458, TSH 4.27.  Patient recently presented for memory evaluation, patient considering better memory, she scored 26 out of 30 on the Mini-Mental status exam, I am not sure but the note states possibly she has been part of a financial scheme or the victim I am not sure,  she could not recall any other specific issues with her memory, she did report being under a great deal expressed about the election in the pandemic.  I reviewed the report of a head CT from 2004, patient had a fall, head CT without contrast, no evidence of fracture or intracranial hemorrhage, mild atrophy, overall unremarkable per report (too old to be able to access images). She is here with her daughter who also provides much information. She had knee surgery in 01/2019 and she has improved but it is still hard for her, her knee hurts when she walks. She is very independent, she drives to the grocery store, still driving well, no falls, she does not think her memory has been a problem even since getting anesthesia, she feels her husband's memory problems are so obvious and he knows it, it is frustrating for her husband and she notices it, moreso names of people and she notices it, she hekps him keep appointments. They visit with family and grandchildren, she still enjoys socializing, seeing family, its hard with Covid and isolation and they can't see relatives such as her sister in law who is close by but can't spend time together. They grow collards in their garden. She does notice some memory changes in herself. Daughter says they feel patient's logic is impaired and  some memory, the logic is impaired, she still texts but she cn get mixed up sometime, she had a scammer call her on the phone and a few years ago she gave $300 to someone she had no idea, to pay someone to keep her computer updated, he called again and fortunately she did not lose anything again and she gave up talking to him and went to her bank and told them what had happened and they pulled up her account. The week before last she started to pull up the [employer] site for her husband she pays the bills and suddenly on her monitor she saw menus she called the number and he wanted $200 and she declined. Her mother died young, she was a foster  child, she doesn't know her father. She likes to take a nap in  The afternoon. Discussed POA and HCPOA. She snores heavily, she drools on her pillow, she denies vivid dreams, no shaking, no shuffling, no depression or anxiety.   Reviewed notes, labs and imaging from outside physicians, which showed:   See above  Review of Systems: Patient complains of symptoms per HPI as well as the following symptoms: imbalance, disuse . Pertinent negatives and positives per HPI. All others negative     Social History   Socioeconomic History   Marital status: Married    Spouse name: Not on file   Number of children: 3   Years of education: Not on file   Highest education level: High school graduate  Occupational History   Occupation: Chartered certified accountant  Tobacco Use   Smoking status: Never   Smokeless tobacco: Never  Vaping Use   Vaping Use: Never used  Substance and Sexual Activity   Alcohol use: Never   Drug use: Never   Sexual activity: Not Currently  Other Topics Concern   Not on file  Social History Narrative   Lives at home with husband, [place].  Four grand and five great grands.     Caffeine: diet coke about 1-2 per day    Right handed   Social Determinants of Health   Financial Resource Strain: Not on file  Food Insecurity: Not on file  Transportation Needs: Not on file  Physical Activity: Not on file  Stress: Not on file  Social Connections: Not on file  Intimate Partner Violence: Not on file    Family History  Adopted: Yes  Problem Relation Age of Onset   Heart disease Mother 78       No details   Memory loss Neg Hx    Dementia Neg Hx     Past Medical History:  Diagnosis Date   Graves disease    HTN (hypertension)    Low back pain    Dr. 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   Preop cardiovascular exam 12/20/2018   Nonrheumatic  aortic valve stenosis 12/20/2018   Uterine cancer ([place]) 03/28/2018   Arthralgia of hip or thigh 03/28/2018   Benign essential HTN 03/28/2018   Overweight 03/28/2018   Heart disease 11/09/2014   Leukocytosis 67/20/9470   Diastolic congestive heart failure ([place]) 10/30/2014   Hypothyroidism 10/30/2014   Chest pain at rest 10/30/2014   Chest pain 10/30/2014   Endometrial cancer ([place]) 01/30/2014   Dyspnea 01/11/2014   Edema 01/11/2014   Low back pain with right-sided sciatica 12/13/2013   Degeneration of lumbar or lumbosacral intervertebral disc 12/13/2013   Sciatica 12/13/2013    Past Surgical History:  Procedure Laterality Date   BUNIONECTOMY     left   CATARACT EXTRACTION, BILATERAL Bilateral    last done 11-21-14   COLONOSCOPY WITH PROPOFOL N/A 12/25/2014   Procedure: COLONOSCOPY WITH PROPOFOL;  Surgeon: Garlan Fair, MD;  Location: [facility];  Service: Endoscopy;  Laterality: N/A;   FOOT SURGERY Left 2004   Dr Johnnye Sima   JOINT REPLACEMENT     RTKA   ROBOTIC ASSISTED TOTAL HYSTERECTOMY WITH BILATERAL SALPINGO OOPHERECTOMY  01/30/14   with lymph node biopsy   TOTAL KNEE ARTHROPLASTY     right   TOTAL KNEE REVISION Right 01/04/2019   Procedure: TOTAL KNEE REVISION;  Surgeon: Gaynelle Arabian, MD;  Location: [facility];  Service: Orthopedics;  Laterality: Right;  169mn with block    Current Outpatient Medications  Medication Sig Dispense Refill   amoxicillin (AMOXIL) 500 MG  capsule Take 2,000 mg by mouth See admin instructions. Take 2000 mg 1 hour prior to dental work     CALCIUM PO Take by mouth daily.     Cholecalciferol (VITAMIN D3 PO) Take 2,000 Int'l Units by mouth daily.     Cyanocobalamin (B-12 PO) Take by mouth daily.     donepezil (ARICEPT) 10 MG tablet Take 1 tablet (10 mg total) by mouth at bedtime. 90 tablet 11   ibandronate (BONIVA) 150 MG tablet ibandronate 150 mg tablet  TAKE 1 TABLET BY MOUTH EVERY MONTH.     levothyroxine (SYNTHROID) 88 MCG tablet Take 88 mcg by mouth daily before breakfast.      Multiple Vitamins-Minerals (CENTRUM SILVER PO) Take by mouth.     Naproxen Sodium (ALEVE PO) Take 220 mg by mouth as needed.     OVER THE COUNTER MEDICATION Tums     amLODipine (NORVASC) 2.5 MG tablet Take 1 tablet (2.5 mg total) by mouth daily. 180 tablet 3   No current facility-administered medications for this visit.    Allergies as of 10/06/2021 - Review Complete 10/06/2021  Allergen Reaction Noted   Percocet [oxycodone-acetaminophen] Nausea And Vomiting 09/06/2019    Vitals: BP (!) 146/62    Pulse 65    Ht '5\' 5"'$  (1.651 m)    Wt 177 lb 6.4 oz (80.5 kg)    BMI 29.52 kg/m  Last Weight:  Wt Readings from Last 1 Encounters:  10/06/21 177 lb 6.4 oz (80.5 kg)   Last Height:   Ht Readings from Last 1 Encounters:  10/06/21 '5\' 5"'$  (1.651 m)   MMSE - Mini Mental State Exam 10/06/2021 05/21/2021 09/06/2019  Orientation to time '5 4 5  '$ Orientation to Place '5 5 5  '$ Registration '3 3 3  '$ Attention/ Calculation 0 1 2  Recall '3 2 3  '$ Language- name 2 objects '2 2 2  '$ Language- repeat '1 1 1  '$ Language- follow 3 step command '3 3 3  '$ Language- read & follow direction 1 1  1  Write a sentence '1 1 1  '$ Copy design 1 1 0  Total score '25 24 26   '$ Exam: NAD, pleasant                  Speech:    Speech is normal; fluent and spontaneous with normal comprehension.  Cognition:    The patient is oriented to person, place, and time;     recent and remote memory intact;      language fluent;    Cranial Nerves:    The pupils are equal, round, and reactive to light.Trigeminal sensation is intact and the muscles of mastication are normal. The face is symmetric. The palate elevates in the midline. Hearing intact. Voice is normal. Shoulder shrug is normal. The tongue has normal motion without fasciculations.   Coordination:  No dysmetria  Gait: antalgic with a cane  Motor Observation:    No asymmetry, no atrophy, and no involuntary movements noted. Tone:    Normal muscle tone.     Strength:    Strength is V/V in the upper and lower limbs.      Sensation: intact to LT   Assessment/Plan: This is an extremely lovely 81 year old patient here with her equally lovely famly for concerns of memory loss or more appropriately possibly poor judgment.  She is 81 years old, she is quite independent, she still drives, she lives with her husband, however I am getting concerned when I hear things like she has been the victim of scammer, daughter is concerned for her getting lost at times and worries about her driving, I do think possibly she has some impaired judgment and insight,  I am not quite sure patient is a good historian and she clearly does not like being at the appointment or questioned on her memory or driving. Her son-in-lase is Mardene Sayer, a pcp. However there is no evidence of a neurocognitive disorder, her MMSEs have been stable if not improved, multiple neurocognitive testing has not diagnosed Alzheimer's or Lewy body so we will continue to follow. Daughter is concerned and suggested lexapro or namenda but patient declines.   - Dizziness: Improved per patient.  I do think this is mild orthostasis and I agree with the physician that patient needs to stand up carefully, wear compression stockings(she is not doing that today although she was informed to), manage blood pressure with primary care, and for the vertiginous symptoms we recommended vestibular  therapy but she di dnot follow through.   - Continue aricept  - I am concerned about her memory loss but multiple formal neurocognitive testings did not show a neurodegenerative disorder but we will continue to follow  - I reviewed sleep study, dated August 2021, borderline sleep apnea, no indication for CPAP or any other intervention for sleep disordered breathing. Her husband was diagnosed with sleep apnea cannot tolerate the cpap did I send note to [facility] about aspire or dental device?  - She declined a driving test. Daughter will monitor and place video monitoring in the car  - Patient is never really happy with being at this appointment and being questioned on her driving or memory   - We sent to [facility] for aquatherapy for her gait abnormality, imbalance and lower extremity weakness.   PRIOR Appointment A/P: - MRI of the brain for reversible causes of dementia: Atrophy and chronic microvascular changes. -  findings of formal memory testing which were not consistent with degenerative dementia such as Alzheimer's, Lewy body or  other cortical dementia, there was some pattern of lateralization to performance with almost all her deficits in the areas of visual processing and visual cognitive functioning -May consider FDG PET scan in the future.  They are set up for repeat testing in 9 months and I do think that it is appropriate to wait and retest formal memory at that time.  Return to clinic after that.  Cc: Lawerance Cruel, MD  Sarina Ill, MD  [facility] [address] [place], [place] [address]  Phone [phone] Fax [phone]  I spent over 40 minutes of face-to-face and non-face-to-face time with patient on the  1. Mild cognitive impairment      diagnosis.  This included previsit chart review, lab review, study review, order entry, electronic health record documentation, patient education on the different diagnostic and  therapeutic options, counseling and coordination of care, risks and benefits of management, compliance, or risk factor reduction

## 2021-12-08 DIAGNOSIS — R5383 Other fatigue: Secondary | ICD-10-CM | POA: Diagnosis not present

## 2021-12-08 DIAGNOSIS — M810 Age-related osteoporosis without current pathological fracture: Secondary | ICD-10-CM | POA: Diagnosis not present

## 2021-12-08 DIAGNOSIS — R531 Weakness: Secondary | ICD-10-CM | POA: Diagnosis not present

## 2021-12-08 DIAGNOSIS — M549 Dorsalgia, unspecified: Secondary | ICD-10-CM | POA: Diagnosis not present

## 2021-12-08 DIAGNOSIS — M25551 Pain in right hip: Secondary | ICD-10-CM | POA: Diagnosis not present

## 2022-01-01 ENCOUNTER — Other Ambulatory Visit: Payer: Self-pay | Admitting: Cardiology

## 2022-02-18 DIAGNOSIS — R413 Other amnesia: Secondary | ICD-10-CM | POA: Diagnosis not present

## 2022-02-18 DIAGNOSIS — G4733 Obstructive sleep apnea (adult) (pediatric): Secondary | ICD-10-CM | POA: Diagnosis not present

## 2022-02-18 DIAGNOSIS — E039 Hypothyroidism, unspecified: Secondary | ICD-10-CM | POA: Diagnosis not present

## 2022-02-18 DIAGNOSIS — I10 Essential (primary) hypertension: Secondary | ICD-10-CM | POA: Diagnosis not present

## 2022-02-18 DIAGNOSIS — Z0000 Encounter for general adult medical examination without abnormal findings: Secondary | ICD-10-CM | POA: Diagnosis not present

## 2022-02-18 DIAGNOSIS — M810 Age-related osteoporosis without current pathological fracture: Secondary | ICD-10-CM | POA: Diagnosis not present

## 2022-02-18 DIAGNOSIS — I5030 Unspecified diastolic (congestive) heart failure: Secondary | ICD-10-CM | POA: Diagnosis not present

## 2022-02-20 ENCOUNTER — Other Ambulatory Visit: Payer: Self-pay | Admitting: Family Medicine

## 2022-02-20 DIAGNOSIS — M810 Age-related osteoporosis without current pathological fracture: Secondary | ICD-10-CM

## 2022-04-15 ENCOUNTER — Encounter (HOSPITAL_COMMUNITY): Payer: Self-pay | Admitting: Emergency Medicine

## 2022-04-15 ENCOUNTER — Other Ambulatory Visit: Payer: Self-pay

## 2022-04-15 ENCOUNTER — Emergency Department (HOSPITAL_COMMUNITY): Payer: Medicare PPO

## 2022-04-15 ENCOUNTER — Inpatient Hospital Stay (HOSPITAL_COMMUNITY)
Admission: EM | Admit: 2022-04-15 | Discharge: 2022-04-23 | DRG: 522 | Disposition: A | Discharge status: Skilled Nursing Facility | Payer: Medicare PPO | Attending: Internal Medicine | Admitting: Internal Medicine

## 2022-04-15 DIAGNOSIS — S72012A Unspecified intracapsular fracture of left femur, initial encounter for closed fracture: Principal | ICD-10-CM | POA: Diagnosis present

## 2022-04-15 DIAGNOSIS — Z90710 Acquired absence of both cervix and uterus: Secondary | ICD-10-CM | POA: Diagnosis not present

## 2022-04-15 DIAGNOSIS — W19XXXA Unspecified fall, initial encounter: Secondary | ICD-10-CM | POA: Diagnosis not present

## 2022-04-15 DIAGNOSIS — Z6830 Body mass index (BMI) 30.0-30.9, adult: Secondary | ICD-10-CM

## 2022-04-15 DIAGNOSIS — E559 Vitamin D deficiency, unspecified: Secondary | ICD-10-CM | POA: Diagnosis present

## 2022-04-15 DIAGNOSIS — Z885 Allergy status to narcotic agent status: Secondary | ICD-10-CM

## 2022-04-15 DIAGNOSIS — M25552 Pain in left hip: Secondary | ICD-10-CM | POA: Diagnosis not present

## 2022-04-15 DIAGNOSIS — I160 Hypertensive urgency: Secondary | ICD-10-CM | POA: Diagnosis not present

## 2022-04-15 DIAGNOSIS — Z471 Aftercare following joint replacement surgery: Secondary | ICD-10-CM | POA: Diagnosis not present

## 2022-04-15 DIAGNOSIS — I129 Hypertensive chronic kidney disease with stage 1 through stage 4 chronic kidney disease, or unspecified chronic kidney disease: Secondary | ICD-10-CM | POA: Diagnosis present

## 2022-04-15 DIAGNOSIS — F0390 Unspecified dementia without behavioral disturbance: Secondary | ICD-10-CM | POA: Diagnosis present

## 2022-04-15 DIAGNOSIS — N183 Chronic kidney disease, stage 3 unspecified: Secondary | ICD-10-CM | POA: Diagnosis not present

## 2022-04-15 DIAGNOSIS — Z923 Personal history of irradiation: Secondary | ICD-10-CM

## 2022-04-15 DIAGNOSIS — S72002A Fracture of unspecified part of neck of left femur, initial encounter for closed fracture: Secondary | ICD-10-CM | POA: Diagnosis not present

## 2022-04-15 DIAGNOSIS — I509 Heart failure, unspecified: Secondary | ICD-10-CM | POA: Diagnosis not present

## 2022-04-15 DIAGNOSIS — M810 Age-related osteoporosis without current pathological fracture: Secondary | ICD-10-CM | POA: Diagnosis present

## 2022-04-15 DIAGNOSIS — G3184 Mild cognitive impairment, so stated: Secondary | ICD-10-CM | POA: Diagnosis present

## 2022-04-15 DIAGNOSIS — N1831 Chronic kidney disease, stage 3a: Secondary | ICD-10-CM | POA: Diagnosis present

## 2022-04-15 DIAGNOSIS — M8000XA Age-related osteoporosis with current pathological fracture, unspecified site, initial encounter for fracture: Secondary | ICD-10-CM

## 2022-04-15 DIAGNOSIS — S72042A Displaced fracture of base of neck of left femur, initial encounter for closed fracture: Secondary | ICD-10-CM | POA: Diagnosis not present

## 2022-04-15 DIAGNOSIS — Z96659 Presence of unspecified artificial knee joint: Secondary | ICD-10-CM | POA: Diagnosis present

## 2022-04-15 DIAGNOSIS — E669 Obesity, unspecified: Secondary | ICD-10-CM | POA: Diagnosis not present

## 2022-04-15 DIAGNOSIS — Z79899 Other long term (current) drug therapy: Secondary | ICD-10-CM | POA: Diagnosis not present

## 2022-04-15 DIAGNOSIS — Y92009 Unspecified place in unspecified non-institutional (private) residence as the place of occurrence of the external cause: Secondary | ICD-10-CM | POA: Diagnosis not present

## 2022-04-15 DIAGNOSIS — E039 Hypothyroidism, unspecified: Secondary | ICD-10-CM | POA: Diagnosis present

## 2022-04-15 DIAGNOSIS — K5900 Constipation, unspecified: Secondary | ICD-10-CM | POA: Diagnosis not present

## 2022-04-15 DIAGNOSIS — W010XXA Fall on same level from slipping, tripping and stumbling without subsequent striking against object, initial encounter: Secondary | ICD-10-CM | POA: Diagnosis present

## 2022-04-15 DIAGNOSIS — T461X6A Underdosing of calcium-channel blockers, initial encounter: Secondary | ICD-10-CM | POA: Diagnosis present

## 2022-04-15 DIAGNOSIS — Z96642 Presence of left artificial hip joint: Secondary | ICD-10-CM | POA: Diagnosis not present

## 2022-04-15 DIAGNOSIS — D72829 Elevated white blood cell count, unspecified: Secondary | ICD-10-CM | POA: Diagnosis present

## 2022-04-15 DIAGNOSIS — M25462 Effusion, left knee: Secondary | ICD-10-CM | POA: Diagnosis not present

## 2022-04-15 DIAGNOSIS — Z8542 Personal history of malignant neoplasm of other parts of uterus: Secondary | ICD-10-CM

## 2022-04-15 DIAGNOSIS — Z90722 Acquired absence of ovaries, bilateral: Secondary | ICD-10-CM | POA: Diagnosis not present

## 2022-04-15 DIAGNOSIS — M4186 Other forms of scoliosis, lumbar region: Secondary | ICD-10-CM | POA: Diagnosis not present

## 2022-04-15 DIAGNOSIS — Z91199 Patient's noncompliance with other medical treatment and regimen due to unspecified reason: Secondary | ICD-10-CM

## 2022-04-15 DIAGNOSIS — Z9079 Acquired absence of other genital organ(s): Secondary | ICD-10-CM

## 2022-04-15 DIAGNOSIS — I10 Essential (primary) hypertension: Secondary | ICD-10-CM | POA: Diagnosis not present

## 2022-04-15 DIAGNOSIS — Z7401 Bed confinement status: Secondary | ICD-10-CM | POA: Diagnosis not present

## 2022-04-15 DIAGNOSIS — R9431 Abnormal electrocardiogram [ECG] [EKG]: Secondary | ICD-10-CM | POA: Diagnosis not present

## 2022-04-15 DIAGNOSIS — I517 Cardiomegaly: Secondary | ICD-10-CM | POA: Diagnosis not present

## 2022-04-15 DIAGNOSIS — I130 Hypertensive heart and chronic kidney disease with heart failure and stage 1 through stage 4 chronic kidney disease, or unspecified chronic kidney disease: Secondary | ICD-10-CM | POA: Diagnosis not present

## 2022-04-15 DIAGNOSIS — E038 Other specified hypothyroidism: Secondary | ICD-10-CM | POA: Diagnosis not present

## 2022-04-15 DIAGNOSIS — Z79890 Hormone replacement therapy (postmenopausal): Secondary | ICD-10-CM

## 2022-04-15 DIAGNOSIS — N189 Chronic kidney disease, unspecified: Secondary | ICD-10-CM | POA: Diagnosis not present

## 2022-04-15 DIAGNOSIS — R531 Weakness: Secondary | ICD-10-CM | POA: Diagnosis not present

## 2022-04-15 DIAGNOSIS — R000 Tachycardia, unspecified: Secondary | ICD-10-CM | POA: Diagnosis not present

## 2022-04-15 DIAGNOSIS — G8911 Acute pain due to trauma: Secondary | ICD-10-CM | POA: Diagnosis not present

## 2022-04-15 LAB — URINALYSIS, ROUTINE W REFLEX MICROSCOPIC
Bacteria, UA: NONE SEEN
Bilirubin Urine: NEGATIVE
Glucose, UA: NEGATIVE mg/dL
Ketones, ur: 5 mg/dL — AB
Leukocytes,Ua: NEGATIVE
Nitrite: NEGATIVE
Protein, ur: NEGATIVE mg/dL
Specific Gravity, Urine: 1.024 (ref 1.005–1.030)
pH: 5 (ref 5.0–8.0)

## 2022-04-15 LAB — CBC WITH DIFFERENTIAL/PLATELET
Abs Immature Granulocytes: 0.07 10*3/uL (ref 0.00–0.07)
Basophils Absolute: 0.1 10*3/uL (ref 0.0–0.1)
Basophils Relative: 1 %
Eosinophils Absolute: 0.1 10*3/uL (ref 0.0–0.5)
Eosinophils Relative: 1 %
HCT: 38.2 % (ref 36.0–46.0)
Hemoglobin: 12.7 g/dL (ref 12.0–15.0)
Immature Granulocytes: 1 %
Lymphocytes Relative: 13 %
Lymphs Abs: 1.4 10*3/uL (ref 0.7–4.0)
MCH: 31.8 pg (ref 26.0–34.0)
MCHC: 33.2 g/dL (ref 30.0–36.0)
MCV: 95.5 fL (ref 80.0–100.0)
Monocytes Absolute: 0.5 10*3/uL (ref 0.1–1.0)
Monocytes Relative: 5 %
Neutro Abs: 8.8 10*3/uL — ABNORMAL HIGH (ref 1.7–7.7)
Neutrophils Relative %: 79 %
Platelets: 196 10*3/uL (ref 150–400)
RBC: 4 MIL/uL (ref 3.87–5.11)
RDW: 12.6 % (ref 11.5–15.5)
WBC: 10.9 10*3/uL — ABNORMAL HIGH (ref 4.0–10.5)
nRBC: 0 % (ref 0.0–0.2)

## 2022-04-15 LAB — BASIC METABOLIC PANEL
Anion gap: 8 (ref 5–15)
BUN: 20 mg/dL (ref 8–23)
CO2: 23 mmol/L (ref 22–32)
Calcium: 8.5 mg/dL — ABNORMAL LOW (ref 8.9–10.3)
Chloride: 110 mmol/L (ref 98–111)
Creatinine, Ser: 1.05 mg/dL — ABNORMAL HIGH (ref 0.44–1.00)
GFR, Estimated: 53 mL/min — ABNORMAL LOW (ref >60)
Glucose, Bld: 113 mg/dL — ABNORMAL HIGH (ref 70–99)
Potassium: 4.3 mmol/L (ref 3.5–5.1)
Sodium: 141 mmol/L (ref 135–145)

## 2022-04-15 LAB — TYPE AND SCREEN
ABO/RH(D): O POS
Antibody Screen: NEGATIVE

## 2022-04-15 LAB — PROTIME-INR
INR: 1 (ref 0.8–1.2)
Prothrombin Time: 12.6 seconds (ref 11.4–15.2)

## 2022-04-15 LAB — CK: Total CK: 51 U/L (ref 38–234)

## 2022-04-15 MED ORDER — CALCIUM GLUCONATE-NACL 1-0.675 GM/50ML-% IV SOLN
1.0000 g | Freq: Once | INTRAVENOUS | Status: AC
  Administered 2022-04-15: 1000 mg via INTRAVENOUS
  Filled 2022-04-15: qty 50

## 2022-04-15 MED ORDER — HYDROCODONE-ACETAMINOPHEN 5-325 MG PO TABS
1.0000 | ORAL_TABLET | Freq: Four times a day (QID) | ORAL | Status: DC | PRN
  Administered 2022-04-15 – 2022-04-17 (×4): 2 via ORAL
  Filled 2022-04-15 (×4): qty 2

## 2022-04-15 MED ORDER — SODIUM CHLORIDE 0.9 % IV SOLN: INTRAVENOUS | Status: AC

## 2022-04-15 MED ORDER — HYDRALAZINE HCL 20 MG/ML IJ SOLN
10.0000 mg | INTRAMUSCULAR | Status: DC | PRN
  Administered 2022-04-17 – 2022-04-21 (×3): 10 mg via INTRAVENOUS
  Filled 2022-04-15 (×3): qty 1

## 2022-04-15 MED ORDER — ENOXAPARIN SODIUM 40 MG/0.4ML IJ SOSY
40.0000 mg | PREFILLED_SYRINGE | INTRAMUSCULAR | Status: DC
  Administered 2022-04-15 – 2022-04-17 (×3): 40 mg via SUBCUTANEOUS
  Filled 2022-04-15 (×3): qty 0.4

## 2022-04-15 MED ORDER — LEVOTHYROXINE SODIUM 88 MCG PO TABS
88.0000 ug | ORAL_TABLET | Freq: Every day | ORAL | Status: DC
  Administered 2022-04-16 – 2022-04-23 (×7): 88 ug via ORAL
  Filled 2022-04-15 (×7): qty 1

## 2022-04-15 MED ORDER — MORPHINE SULFATE (PF) 2 MG/ML IV SOLN
2.0000 mg | INTRAVENOUS | Status: AC | PRN
  Administered 2022-04-15 (×2): 2 mg via INTRAVENOUS
  Filled 2022-04-15 (×2): qty 1

## 2022-04-15 MED ORDER — MORPHINE SULFATE (PF) 2 MG/ML IV SOLN
0.5000 mg | INTRAVENOUS | Status: DC | PRN
  Administered 2022-04-15: 0.5 mg via INTRAVENOUS
  Filled 2022-04-15: qty 1

## 2022-04-15 MED ORDER — LORAZEPAM 2 MG/ML IJ SOLN
0.2500 mg | Freq: Once | INTRAMUSCULAR | Status: AC
  Administered 2022-04-15: 0.25 mg via INTRAVENOUS
  Filled 2022-04-15: qty 1

## 2022-04-15 MED ORDER — DONEPEZIL HCL 10 MG PO TABS
10.0000 mg | ORAL_TABLET | Freq: Every day | ORAL | Status: DC
  Administered 2022-04-15 – 2022-04-22 (×8): 10 mg via ORAL
  Filled 2022-04-15 (×8): qty 1

## 2022-04-15 MED ORDER — ONDANSETRON HCL 4 MG/2ML IJ SOLN
4.0000 mg | Freq: Once | INTRAMUSCULAR | Status: AC
  Administered 2022-04-15: 4 mg via INTRAVENOUS
  Filled 2022-04-15: qty 2

## 2022-04-15 NOTE — Consult Note (Signed)
Orthopaedic Consult  Date/Time: 04/15/22 7:05 PM  Patient Name: Terri Ortega  Attending Physician: Norval Morton, MD    ASSESSMENT & PLAN  Orthopaedic Assessment: 81 y.o. female with left displaced femoral neck fracture.  Plan: Petra Kuba of the injury was explained to the patient and her family members.  I demonstrated the displaced femoral neck fracture on the radiographs.  Given that she was previously ambulatory before this injury, we discussed that she would likely benefit from surgical treatment, likely in the form of hemiarthroplasty, in order to restore her mobility and given the significant morbidity associated with nonoperative treatment.  Most likely surgery will be performed tomorrow or Friday, depending on OR availability.  I explained that surgery will most likely not be with myself, but someone will take excellent care of her.  All questions were answered.  Recommendations: Nonweightbearing left lower extremity, bedrest pending surgery Medically appropriate analgesia Hip fracture block ordered Okay for regular diet, n.p.o. at midnight for possible OR 04/16/2022 Admit to medicine Preoperative optimization, risk stratification, and clearance per medicine Plan for OR 0914/2023 or 04/17/2022   Georgeanna Harrison M.D. Orthopaedic Surgery [facility]   Medical Decision Making  Amount/complexity of data: Is there a current pathologic fracture (e.g. neoplastic, osteoporotic insufficiency fracture)? Yes Independent interpretation of radiographic studies: Yes Review of radiology results (e.g. reports): Yes Tests ordered (e.g. additional radiographic studies, labs): No Lab results reviewed: Yes Reviewed old records: Yes History from another source (independent historian, e.g. family/friend/etc.): Yes Discussion of imaging, clinical data, and or management with independent medical provider: Yes Risk: Patient receiving IV controlled substances  for pain: Yes Fracture requiring manipulation: No Urgent or emergent (non-elective) surgery likely this admission: Yes Presence of medical comorbidities and/or surgical risk factors (e.g. current smoker, CAD, diabetes, COPD, CKD, etc.): No Closed fracture management WITHOUT manipulation: No Urgent minor procedure (e.g. joint aspiration, compartment pressure measurement, etc.): No Will likely need surgery as an outpatient: No     HPI Terri Ortega is a 81 y.o. female. Orthopaedic consultation has specifically been requested to address this patient's current musculoskeletal presentation. She sustained a fall and had immediate pain in the left hip/thigh.  Pain is sharp and severe.  Pain is worse movement and better with rest and immobilization.  Since the fall the patient was not able to ambulate or bear weight due to pain.  At baseline the patient is ambulatory with a cane.  She denies additional injuries.   PMH Past Medical History:  Diagnosis Date   Graves disease    HTN (hypertension)    Low back pain    Dr. Ronnald Ramp   Osteoporosis    Pneumonia 2012   Radiation 03/20/14, 03/27/14, 04/05/14, 04/12/14, 04/17/14   bracytherapy to proximal vagina 30 gray   Scoliosis    Uterine cancer ([place])    surgery and radiation x 5 treatments-last 9'15   Vitamin D deficiency      PSH Past Surgical History:  Procedure Laterality Date   BUNIONECTOMY     left   CATARACT EXTRACTION, BILATERAL Bilateral    last done 11-21-14   COLONOSCOPY WITH PROPOFOL N/A 12/25/2014   Procedure: COLONOSCOPY WITH PROPOFOL;  Surgeon: Garlan Fair, MD;  Location: [facility];  Service: Endoscopy;  Laterality: N/A;   FOOT SURGERY Left 2004   Dr Johnnye Sima   JOINT REPLACEMENT     RTKA   ROBOTIC ASSISTED TOTAL HYSTERECTOMY WITH BILATERAL SALPINGO OOPHERECTOMY  01/30/14   with lymph node biopsy  TOTAL KNEE ARTHROPLASTY     right   TOTAL KNEE REVISION Right 01/04/2019   Procedure: TOTAL KNEE REVISION;  Surgeon:  Gaynelle Arabian, MD;  Location: [facility];  Service: Orthopedics;  Laterality: Right;  174mn with block   Home Medications Prior to Admission medications   Medication Sig Start Date End Date Taking? Authorizing Provider  CALCIUM PO Take 1 tablet by mouth daily.   Yes [provider]  Cholecalciferol (VITAMIN D3 PO) Take 2,000 Int'l Units by mouth daily.   Yes [provider]  donepezil (ARICEPT) 10 MG tablet Take 1 tablet (10 mg total) by mouth at bedtime. 05/21/21  Yes AMelvenia Beam MD  ibandronate (BONIVA) 150 MG tablet Take 150 mg by mouth every 30 (thirty) days. Pt takes on the 25th of each month   Yes [provider]  levothyroxine (SYNTHROID) 88 MCG tablet Take 88 mcg by mouth daily before breakfast.  10/02/13  Yes [provider]  amLODipine (NORVASC) 2.5 MG tablet Take 1 tablet by mouth once daily Patient not taking: Reported on 04/15/2022 01/01/22   HMinus Breeding MD     Allergies Allergies  Allergen Reactions   Percocet [Oxycodone-Acetaminophen] Nausea And Vomiting     Family History Family History  Adopted: Yes  Problem Relation Age of Onset   Heart disease Mother 622      No details   Memory loss Neg Hx    Dementia Neg Hx     Social History Social History   Socioeconomic History   Marital status: Married    Spouse name: Not on file   Number of children: 3   Years of education: Not on file   Highest education level: High school graduate  Occupational History   Occupation: aChartered certified accountant Tobacco Use   Smoking status: Never   Smokeless tobacco: Never  Vaping Use   Vaping Use: Never used  Substance and Sexual Activity   Alcohol use: Never   Drug use: Never   Sexual activity: Not Currently  Other Topics Concern   Not on file  Social History Narrative   Lives at home with husband, BRush Landmark  Four grand and five great grands.     Caffeine: diet coke about 1-2 per day    Right handed   Social Determinants of Health    Financial Resource Strain: Not on file  Food Insecurity: Not on file  Transportation Needs: Not on file  Physical Activity: Not on file  Stress: Not on file  Social Connections: Not on file  Intimate Partner Violence: Not on file     Review of Systems MSK: As noted per HPI above GI: No current Nausea/vomiting ENT: Denies sore throat, epistaxis CV: Denies chest pain  Resp: No current shortness of breath  Other than mentioned above, there are no Constitutional, Neurological, Psychiatric, ENT, Ophthalmological, Cardiovascular, Respiratory, GI, GU, Musculoskeletal, Integumentary, Lymphatic, Endocrine or Allergic issues.     Imaging  Independent interpretation of orthopaedic-relevant films: Multiple radiographic views of the left hip and pelvis demonstrate left displaced femoral neck fracture.  Radiographic results: DG Chest 1 View  Result Date: 04/15/2022 CLINICAL DATA:  [id number]: CHEST  1 VIEW COMPARISON:  10/31/2014 FINDINGS: Cardiac enlargement without heart failure. Lungs are clear without infiltrate or effusion. No acute skeletal abnormality. IMPRESSION: Prominent heart size without acute abnormality. Electronically Signed   By: CFranchot GalloM.D.   On: 04/15/2022 16:12   DG Hip Unilat With Pelvis 2-3 Views Left  Result Date:  04/15/2022 CLINICAL DATA:  Fall EXAM: DG HIP (WITH OR WITHOUT PELVIS) 2-3V LEFT COMPARISON:  None Available. FINDINGS: Subcapital femoral neck fracture left with impaction and mild displacement. Left hip joint normal. No other fracture in the pelvis. Lumbar scoliosis and degenerative change. IMPRESSION: Subcapital fracture left femoral neck. Electronically Signed   By: Franchot Gallo M.D.   On: 04/15/2022 16:11   Labs  Recent Labs    04/15/22 1614  WBC 10.9*  HGB 12.7  HCT 38.2  PLT 196   Recent Labs    04/15/22 1712  NA 141  K 4.3  CL 110  CO2 23  BUN 20  CREATININE 1.05*  GLUCOSE 113*  CALCIUM 8.5*   Lab Results  Component Value  Date   INR 1.0 04/15/2022   INR 1.1 12/30/2018   INR 1.13 10/31/2014        Physical Examination  Patient is a 81 y.o. year old female who is alert, well appearing, and in no distress, mood is calm.  Orientation: oriented to person, place, time, and general circumstances  Vital Signs: BP (!) 175/66 (BP Location: Left Arm)   Pulse 77   Temp 97.7 F (36.5 C) (Oral)   Resp 14   Ht '5\' 4"'$  (1.626 m)   Wt 79.4 kg   SpO2 94%   BMI 30.04 kg/m    Gait: Unable to ambulate due to injury.  Supine on stretcher.  Heart: Normal rate Lungs: Non-labored breathing Abdomen: Soft, Non-tender   Right Upper Extremity: Inspection: Atraumatic Palpation: Nontender ROM: Full, painless Strength: Normal Sensation: Intact to light touch distally Skin: Intact Peripheral Vascular: Well perfused Joint Stability: No instability Reflexes: No pathologic Lymph Nodes: None Palpable Coordination: Intact, normal   Left Upper Extremity: Inspection: Atraumatic Palpation: Nontender ROM: Full, painless Strength: Normal Sensation: Intact to light touch distally Skin: Intact Peripheral Vascular: Well perfused Joint Stability: No instability Reflexes: No pathologic Lymph Nodes: None Palpable Coordination: Intact, normal    Right Lower Extremity: Inspection: Atraumatic Palpation: Baseline tenderness about knee related to residual pain after total knee arthroplasty ROM: Full, painless Strength: Normal Sensation: Intact to light touch distally Skin: Intact Peripheral Vascular: Well perfused Joint Stability: No instability Reflexes: No pathologic Lymph Nodes: None Palpable Coordination: Intact, normal   Left Lower Extremity: Inspection: Held in flexion and external rotation Palpation: Tender to palpation over hip/groin and site of known fracture ROM: Overall limited due to hip injury Strength: Intact dorsiflexion, plantarflexion, and EHL Sensation: Intact light touch in the superficial peroneal,  deep peroneal, and tibial distributions Skin: Intact Peripheral Vascular: 2+ DP, warm and well-perfused Joint Stability: No instability Reflexes: No pathologic Lymph Nodes: None Palpable Coordination: Limited by pain and injury    Pelvis: Skin: Intact Palpation: Nontender Stability: No instability      The review of the patient's medications does not in any way constitute an endorsement, by this clinician,  of their use, dosage, indications, route, efficacy, interactions, or other clinical parameters.  This note was generated within the [employer] EMR using Dragon medical speech recognition software and may contain inherent errors or omissions not intended by the user. Grammatical and punctuation errors, random word insertions, deletions, pronoun errors and incomplete sentences are occasional consequences of this technology due to software limitations. Not all errors are caught or corrected.  Although every attempt is made to root out erroneus and incomplete transcription, the note may still not fully represent the intent or opinion of the author. If there are questions or concerns about  the content of this note or information contained within the body of this dictation they should be addressed directly with the author for clarification.

## 2022-04-15 NOTE — ED Provider Notes (Signed)
[facility] EMERGENCY DEPARTMENT Provider Note   CSN: [id number] Arrival date & time: 04/15/22  1439     History  Chief Complaint  Patient presents with   Lytle Michaels    Terri Ortega is a 81 y.o. female.  Patient presents to the emergency department today for evaluation of left hip pain.  Patient states that she was standing in her driveway when her grandson ran up to hug her and accidentally knocked her over.  She fell onto her left side and hip.  She denies preceding dizziness, lightheadedness, chest pain or shortness of breath.  She did not hit her head or hurt her neck and denies history of anticoagulation.  Patient has severe pain in her left hip area and left groin.  No knee pain or back pain.  Per family at bedside, no confusion.  EMS was called for transport.  IV placed, no other treatments in route.       Home Medications Prior to Admission medications   Medication Sig Start Date End Date Taking? Authorizing Provider  amLODipine (NORVASC) 2.5 MG tablet Take 1 tablet by mouth once daily 01/01/22   Minus Breeding, MD  amoxicillin (AMOXIL) 500 MG capsule Take 2,000 mg by mouth See admin instructions. Take 2000 mg 1 hour prior to dental work    [provider]  CALCIUM PO Take by mouth daily.    [provider]  Cholecalciferol (VITAMIN D3 PO) Take 2,000 Int'l Units by mouth daily.    [provider]  Cyanocobalamin (B-12 PO) Take by mouth daily.    [provider]  donepezil (ARICEPT) 10 MG tablet Take 1 tablet (10 mg total) by mouth at bedtime. 05/21/21   Melvenia Beam, MD  ibandronate (BONIVA) 150 MG tablet ibandronate 150 mg tablet  TAKE 1 TABLET BY MOUTH EVERY MONTH.    [provider]  levothyroxine (SYNTHROID) 88 MCG tablet Take 88 mcg by mouth daily before breakfast.  10/02/13   [provider]  Multiple Vitamins-Minerals (CENTRUM SILVER PO) Take by mouth.    [provider]  Naproxen  Sodium (ALEVE PO) Take 220 mg by mouth as needed.    [provider]  OVER THE COUNTER MEDICATION Tums    [provider]      Allergies    Percocet [oxycodone-acetaminophen]    Review of Systems   Review of Systems  Physical Exam Updated Vital Signs BP (!) 193/84 (BP Location: Right Arm)   Pulse 60   Temp 98 F (36.7 C)   Resp 17   Ht '5\' 4"'$  (1.626 m)   Wt 79.4 kg   SpO2 95%   BMI 30.04 kg/m   Physical Exam Vitals and nursing note reviewed.  Constitutional:      General: She is not in acute distress.    Appearance: She is well-developed.  HENT:     Head: Normocephalic and atraumatic.     Right Ear: External ear normal.     Left Ear: External ear normal.     Nose: Nose normal.  Eyes:     Conjunctiva/sclera: Conjunctivae normal.  Cardiovascular:     Rate and Rhythm: Normal rate and regular rhythm.     Heart sounds: No murmur heard. Pulmonary:     Effort: No respiratory distress.     Breath sounds: No wheezing, rhonchi or rales.  Abdominal:     Palpations: Abdomen is soft.     Tenderness: There is no abdominal tenderness. There is  no guarding or rebound.  Musculoskeletal:     Cervical back: Normal range of motion and neck supple.     Thoracic back: No tenderness or bony tenderness.     Lumbar back: No tenderness or bony tenderness.     Left hip: Bony tenderness present. Decreased range of motion.     Left upper leg: No tenderness.     Left knee: Normal range of motion. No tenderness.     Right lower leg: No edema.     Left lower leg: No edema.     Left ankle: No tenderness. Normal range of motion.     Left foot: Normal range of motion. No tenderness.  Skin:    General: Skin is warm and dry.     Findings: No rash.  Neurological:     General: No focal deficit present.     Mental Status: She is alert. Mental status is at baseline.     Motor: No weakness.  Psychiatric:        Mood and Affect: Mood normal.     ED Results / Procedures /  Treatments   Labs (all labs ordered are listed, but only abnormal results are displayed) Labs Reviewed  CBC WITH DIFFERENTIAL/PLATELET - Abnormal; Notable for the following components:      Result Value   WBC 10.9 (*)    Neutro Abs 8.8 (*)    All other components within normal limits  BASIC METABOLIC PANEL - Abnormal; Notable for the following components:   Glucose, Bld 113 (*)    Creatinine, Ser 1.05 (*)    Calcium 8.5 (*)    GFR, Estimated 53 (*)    All other components within normal limits  PROTIME-INR  TYPE AND SCREEN     Radiology DG Chest 1 View  Result Date: 04/15/2022 CLINICAL DATA:  [id number] EXAM: CHEST  1 VIEW COMPARISON:  10/31/2014 FINDINGS: Cardiac enlargement without heart failure. Lungs are clear without infiltrate or effusion. No acute skeletal abnormality. IMPRESSION: Prominent heart size without acute abnormality. Electronically Signed   By: Franchot Gallo M.D.   On: 04/15/2022 16:12   DG Hip Unilat With Pelvis 2-3 Views Left  Result Date: 04/15/2022 CLINICAL DATA:  Fall EXAM: DG HIP (WITH OR WITHOUT PELVIS) 2-3V LEFT COMPARISON:  None Available. FINDINGS: Subcapital femoral neck fracture left with impaction and mild displacement. Left hip joint normal. No other fracture in the pelvis. Lumbar scoliosis and degenerative change. IMPRESSION: Subcapital fracture left femoral neck. Electronically Signed   By: Franchot Gallo M.D.   On: 04/15/2022 16:11    Procedures Procedures    Medications Ordered in ED Medications  morphine (PF) 2 MG/ML injection 2 mg (2 mg Intravenous Given 04/15/22 1517)  ondansetron (ZOFRAN) injection 4 mg (4 mg Intravenous Given 04/15/22 1516)    ED Course/ Medical Decision Making/ A&P    Patient seen and examined. History obtained directly from patient and family at bedside.   Labs/EKG: None ordered  Imaging: Ordered X-ray of the hip and pelvis  Medications/Fluids: Ordered: morphine '2mg'$  IV, zofran '4mg'$  IV  Most recent vital signs  reviewed and are as follows: BP (!) 193/84 (BP Location: Right Arm)   Pulse 60   Temp 98 F (36.7 C)   Resp 17   Ht '5\' 4"'$  (1.626 m)   Wt 79.4 kg   SpO2 95%   BMI 30.04 kg/m   Initial impression: Left hip pain after fall  3:50 PM Per radiology tech, positive x-ray for hip fracture.  Added 1 view chest.  Added CBC, BMP, type and screen.  4:18 PM Spoke with Dr. Mable Fill of orthopedics. He has reviewed imaging. They will consult on patient.   6:28 PM patient resting.  Labs personally reviewed and interpreted including: CBC with elevated white blood cell count at 10.9 otherwise unremarkable with normal hemoglobin; BMP initially hemolyzed but resulted with mildly elevated creatinine at 1.05, normal electrolytes, glucose 113; INR normal.  Imaging personally visualized and interpreted including: Chest x-ray agree negative; x-ray of the left hip agree subcapital femoral neck fracture.  Reviewed pertinent lab work and imaging with patient and family at bedside. Questions answered.   Most current vital signs reviewed and are as follows: BP (!) 175/66 (BP Location: Left Arm)   Pulse 77   Temp 97.7 F (36.5 C) (Oral)   Resp 14   Ht '5\' 4"'$  (1.626 m)   Wt 79.4 kg   SpO2 94%   BMI 30.04 kg/m   Plan: Admit to hospital.   Consulted with Dr. Tamala Julian of [facility] hospitalist who will see patient for admission.                            Medical Decision Making Amount and/or Complexity of Data Reviewed Labs: ordered. Radiology: ordered.  Risk Prescription drug management. Decision regarding hospitalization.   Patient with hip fracture.  Fall obviously mechanical in nature.  Low concern for head or neck injury.  Patient is stable.  No concern for significant blood loss at time of ED stay.  No low back pain reported.  Lower extremity is neurovascularly intact without signs of compartment syndrome.        Final Clinical Impression(s) / ED Diagnoses Final diagnoses:  Fall from slip,  trip, or stumble, initial encounter  Subcapital fracture of left hip, closed, initial encounter [facility])    Rx / DC Orders ED Discharge Orders     None         Carlisle Cater, PA-C 04/15/22 1830    Lajean Saver, MD 04/15/22 2026

## 2022-04-15 NOTE — ED Notes (Signed)
Patient transported to X-ray 

## 2022-04-15 NOTE — ED Notes (Signed)
RN called unit 6N to activate purpleman for handoff.

## 2022-04-15 NOTE — ED Triage Notes (Signed)
Pt BIB [facility] from home due to falling on her left side due to grandson jumping on her and losing balance.  Pt complaints of left hip pain and left arm pain.  20g left AC.

## 2022-04-15 NOTE — ED Notes (Signed)
RN attempting to call report, unable to reach nurse.

## 2022-04-15 NOTE — ED Notes (Signed)
RN attempted to give report again.

## 2022-04-15 NOTE — ED Notes (Addendum)
RN awaiting purpleman for handoff. RN reach out to unit for update.

## 2022-04-15 NOTE — ED Notes (Signed)
BMP recollected and sent to lab

## 2022-04-15 NOTE — H&P (Addendum)
History and Physical    Patient: Terri Ortega NFA:[id number] DOB: April 21, 1941 DOA: 04/15/2022 DOS: the patient was seen and examined on 04/15/2022 PCP: Lawerance Cruel, MD  Patient coming from: Daughter's house via EMS  Chief Complaint:  Chief Complaint  Patient presents with   Fall   HPI: Terri Ortega is a 81 y.o. female with medical history significant of pmh HTN, hypothyroidism, mild cognitive impairment osteoporosis who presents after having a fall.  She was outside at her daughter's house playing with her great-grandson in the driveway when she lost her balance and landed on her left hip.  Denies any loss of consciousness or trauma to her head.  Patient complained of pain in the left hip as well as left hand.  She was unable to ambulate or bear weight on her left leg.  Otherwise she had been in her usual state of health.  Upon admission into the emergency department patient was noted to be afebrile with blood pressures elevated up to 193/84.  Labs significant for 10.9, BUN 20, creatinine 1.05, and calcium 8.5.  X-rays revealed a subcapital fracture of the left femoral neck.  Dr. Mable Fill for orthopedics was consulted and recommended keeping the patient n.p.o.  Patient has been given Zofran and morphine for pain.  Review of Systems: As mentioned in the history of present illness. All other systems reviewed and are negative. Past Medical History:  Diagnosis Date   Graves disease    HTN (hypertension)    Low back pain    Dr. Ronnald Ramp   Osteoporosis    Pneumonia 2012   Radiation 03/20/14, 03/27/14, 04/05/14, 04/12/14, 04/17/14   bracytherapy to proximal vagina 30 gray   Scoliosis    Uterine cancer ([place])    surgery and radiation x 5 treatments-last 9'15   Vitamin D deficiency    Past Surgical History:  Procedure Laterality Date   BUNIONECTOMY     left   CATARACT EXTRACTION, BILATERAL Bilateral    last done 11-21-14   COLONOSCOPY WITH PROPOFOL N/A 12/25/2014   Procedure:  COLONOSCOPY WITH PROPOFOL;  Surgeon: Garlan Fair, MD;  Location: [facility];  Service: Endoscopy;  Laterality: N/A;   FOOT SURGERY Left 2004   Dr Johnnye Sima   JOINT REPLACEMENT     RTKA   ROBOTIC ASSISTED TOTAL HYSTERECTOMY WITH BILATERAL SALPINGO OOPHERECTOMY  01/30/14   with lymph node biopsy   TOTAL KNEE ARTHROPLASTY     right   TOTAL KNEE REVISION Right 01/04/2019   Procedure: TOTAL KNEE REVISION;  Surgeon: Gaynelle Arabian, MD;  Location: [facility];  Service: Orthopedics;  Laterality: Right;  151mn with block   Social History:  reports that she has never smoked. She has never used smokeless tobacco. She reports that she does not drink alcohol and does not use drugs.  Allergies  Allergen Reactions   Percocet [Oxycodone-Acetaminophen] Nausea And Vomiting    Family History  Adopted: Yes  Problem Relation Age of Onset   Heart disease Mother 630      No details   Memory loss Neg Hx    Dementia Neg Hx     Prior to Admission medications   Medication Sig Start Date End Date Taking? Authorizing Provider  amLODipine (NORVASC) 2.5 MG tablet Take 1 tablet by mouth once daily 01/01/22   HMinus Breeding MD  amoxicillin (AMOXIL) 500 MG capsule Take 2,000 mg by mouth See admin instructions. Take 2000 mg 1 hour prior to dental work    [provider]  CALCIUM PO Take by mouth daily.    [provider]  Cholecalciferol (VITAMIN D3 PO) Take 2,000 Int'l Units by mouth daily.    [provider]  Cyanocobalamin (B-12 PO) Take by mouth daily.    [provider]  donepezil (ARICEPT) 10 MG tablet Take 1 tablet (10 mg total) by mouth at bedtime. 05/21/21   Melvenia Beam, MD  ibandronate (BONIVA) 150 MG tablet ibandronate 150 mg tablet  TAKE 1 TABLET BY MOUTH EVERY MONTH.    [provider]  levothyroxine (SYNTHROID) 88 MCG tablet Take 88 mcg by mouth daily before breakfast.  10/02/13   [provider]  Multiple Vitamins-Minerals (CENTRUM SILVER  PO) Take by mouth.    [provider]  Naproxen Sodium (ALEVE PO) Take 220 mg by mouth as needed.    [provider]  OVER THE COUNTER MEDICATION Tums    [provider]    Physical Exam: Vitals:   04/15/22 1446 04/15/22 1446 04/15/22 1823  BP:  (!) 193/84 (!) 175/66  Pulse:  60 77  Resp:  17 14  Temp:  98 F (36.7 C) 97.7 F (36.5 C)  TempSrc:   Oral  SpO2:  95% 94%  Weight: 79.4 kg    Height: '5\' 4"'$  (1.626 m)     Constitutional: Elderly female currently in no acute distress Eyes: PERRL, lids and conjunctivae normal ENMT: Mucous membranes are moist.   Neck: normal, supple   Respiratory: clear to auscultation bilaterally, no wheezing, no crackles. Normal respiratory effort. No accessory muscle use.  Cardiovascular: Regular rate and rhythm, no murmurs / rubs / gallops. Abdomen: no tenderness, no masses palpated. . Bowel sounds positive.  Musculoskeletal: no clubbing / cyanosis.  Skin: no rashes, lesions, ulcers. No induration Psychiatric: Normal judgment and insight. Alert and oriented x 3. Normal mood.   Data Reviewed:    Assessment and Plan: \ Subcapital fracture of the left femoral neck secondary to fall Acute.  Patient presents after having a mechanical fall landing on the concrete driveway.  No loss of consciousness or trauma to her head.  X-rays revealed subcapital fracture of the left femoral neck.  Dr. Mable Fill orthopedics consulted. -Admit to a MedSurg bed -Hip fracture order set utilized -Nerve block order set utilized -N.p.o. after midnight -Morphine/hydrocodone as needed for pain -Appreciate orthopedic consultative services, will follow-up for any further recommendation  Hypertensive urgency Blood pressures on admission elevated up to 193/84.  Patient reported noncompliance with amlodipine 2.5 mg daily. -Hydralazine IV as needed  Leukocytosis Acute.  WBC 10.9.  Suspect possibly secondary to fracture. -Recheck CBC tomorrow  morning  Chronic kidney disease stage IIIa Creatinine on admission 1.05 with BUN 20.  Baseline creatinine previously noted to be around 0.7-0.8 back in 2020.  This possibly could represent renal insufficiency at this time greater than a 0.3 increased from prior baselines at this time. -Check CK -Normal saline IV fluids at 75 mL/h -Recheck kidney function in a.m.  Osteoporosis Patient had a bone density study with she was diagnosed with osteoporosis based on T score of -2.8 of the left femoral neck back in 05/2020.  Patient is on ibandronate 150 mg monthly, calcium, and vitamin D. -Continue regimen in the outpatient setting  Hypothyroidism -Continue levothyroxine  Mild cognitive impairment Patient is followed by Dr. Jaynee Eagles of neurology in the outpatient setting. -Continue donepezil  Advance Care Planning:   Code Status: Full Code   Consults: Orthopedics  Family Communication: Daughter and husband  updated at bedside  Severity of Illness: The appropriate patient status for this patient is INPATIENT. Inpatient status is judged to be reasonable and necessary in order to provide the required intensity of service to ensure the patient's safety. The patient's presenting symptoms, physical exam findings, and initial radiographic and laboratory data in the context of their chronic comorbidities is felt to place them at high risk for further clinical deterioration. Furthermore, it is not anticipated that the patient will be medically stable for discharge from the hospital within 2 midnights of admission.   * I certify that at the point of admission it is my clinical judgment that the patient will require inpatient hospital care spanning beyond 2 midnights from the point of admission due to high intensity of service, high risk for further deterioration and high frequency of surveillance required.*  Author: Norval Morton, MD 04/15/2022 6:26 PM  For on call review [email or web address].

## 2022-04-16 ENCOUNTER — Inpatient Hospital Stay (HOSPITAL_COMMUNITY): Payer: Medicare PPO | Admitting: Anesthesiology

## 2022-04-16 ENCOUNTER — Inpatient Hospital Stay (HOSPITAL_COMMUNITY): Payer: Medicare PPO

## 2022-04-16 DIAGNOSIS — S72012A Unspecified intracapsular fracture of left femur, initial encounter for closed fracture: Secondary | ICD-10-CM | POA: Diagnosis not present

## 2022-04-16 LAB — BASIC METABOLIC PANEL
Anion gap: 6 (ref 5–15)
BUN: 16 mg/dL (ref 8–23)
CO2: 24 mmol/L (ref 22–32)
Calcium: 8.6 mg/dL — ABNORMAL LOW (ref 8.9–10.3)
Chloride: 111 mmol/L (ref 98–111)
Creatinine, Ser: 1.04 mg/dL — ABNORMAL HIGH (ref 0.44–1.00)
GFR, Estimated: 54 mL/min — ABNORMAL LOW (ref >60)
Glucose, Bld: 163 mg/dL — ABNORMAL HIGH (ref 70–99)
Potassium: 3.9 mmol/L (ref 3.5–5.1)
Sodium: 141 mmol/L (ref 135–145)

## 2022-04-16 LAB — CBC
HCT: 40.4 % (ref 36.0–46.0)
Hemoglobin: 13.7 g/dL (ref 12.0–15.0)
MCH: 31.9 pg (ref 26.0–34.0)
MCHC: 33.9 g/dL (ref 30.0–36.0)
MCV: 94 fL (ref 80.0–100.0)
Platelets: 201 10*3/uL (ref 150–400)
RBC: 4.3 MIL/uL (ref 3.87–5.11)
RDW: 12.9 % (ref 11.5–15.5)
WBC: 16.6 10*3/uL — ABNORMAL HIGH (ref 4.0–10.5)
nRBC: 0 % (ref 0.0–0.2)

## 2022-04-16 NOTE — Progress Notes (Signed)
PROGRESS NOTE    Terri Ortega  XFG:[id number] DOB: [birth year] DOA: 04/15/2022 PCP: Lawerance Cruel, MD   Brief Narrative:  Terri Ortega is a 81 y.o. female with medical history significant of pmh HTN, hypothyroidism, mild cognitive impairment osteoporosis who presents after having a fall.  Found to have a subcapital fracture of the left femoral neck. Orthopedics consulted and hospitalist called for admission.  Assessment & Plan:   Principal Problem:   Subcapital fracture of femur, left, closed, initial encounter [facility]) Active Problems:   Fall at home, initial encounter   Hypertensive urgency   Leukocytosis   CKD (chronic kidney disease) stage 3, GFR 30-59 ml/min (HCC)   Osteoporosis   Hypothyroidism   Mild cognitive impairment  Subcapital fracture of the left femoral neck secondary to fall -Acute s/p mechanical fall -Orthopedics following - plan for sx this am -Morphine/hydrocodone as needed for pain -Appreciate orthopedic consultative services, will follow-up for any further recommendation   Hypertensive urgency -Secondary to above -Complicated by noncompliance with morning BP meds. -Hydralazine IV as needed   Leukocytosis, reactive secondary to above -Follow repeat labs   Chronic kidney disease stage IIIa -at baseline - continue IVF perioperatively -follow labs   Osteoporosis Patient had a bone density study with she was diagnosed with osteoporosis based on T score of -2.8 of the left femoral neck back in 05/2020.  Patient is on ibandronate 150 mg monthly, calcium, and vitamin D. -Continue regimen in the outpatient setting   Hypothyroidism -Continue levothyroxine   Mild cognitive impairment Patient is followed by Dr. Jaynee Eagles of neurology in the outpatient setting. -Continue donepezil   DVT prophylaxis: lovenox Code Status: Full Family Communication: None present  Status is: inpt  Dispo: The patient is from: home              Anticipated  d/c is to: TBD              Anticipated d/c date is: 48-72h pending above              Patient currently note medically stable for discharge  Consultants:  ortho  Procedures:  Tentatively planned ORIF 04/16/22  Antimicrobials:  perioperatively   Subjective: No acute issues/events overnight  Objective: Vitals:   04/15/22 1823 04/15/22 2100 04/15/22 2204 04/16/22 0240  BP: (!) 175/66 (!) 170/85 (!) 168/74 (!) 154/64  Pulse: 77 80 77 84  Resp: '14 16  16  '$ Temp: 97.7 F (36.5 C) 98 F (36.7 C) 99.1 F (37.3 C) 98.3 F (36.8 C)  TempSrc: Oral Oral Oral Oral  SpO2: 94% 96% 90% 90%  Weight:      Height:        Intake/Output Summary (Last 24 hours) at 04/16/2022 0755 Last data filed at 04/16/2022 0300 Gross per 24 hour  Intake 645.08 ml  Output --  Net 645.08 ml   Filed Weights   04/15/22 1446  Weight: 79.4 kg    Examination:  General:  Pleasantly resting in bed, No acute distress. HEENT:  Normocephalic atraumatic.  Sclerae nonicteric, noninjected.  Extraocular movements intact bilaterally. Neck:  Without mass or deformity.  Trachea is midline. Lungs:  Clear to auscultate bilaterally without rhonchi, wheeze, or rales. Heart:  Regular rate and rhythm.  Without murmurs, rubs, or gallops. Abdomen:  Soft, nontender, nondistended.  Without guarding or rebound. Extremities: Without cyanosis, clubbing, edema, or obvious deformity. Vascular:  Dorsalis pedis and posterior tibial pulses palpable bilaterally. Skin:  Warm and dry, no erythema, no  ulcerations.   Data Reviewed: I have personally reviewed following labs and imaging studies  CBC: Recent Labs  Lab 04/15/22 1614 04/16/22 0022  WBC 10.9* 16.6*  NEUTROABS 8.8*  --   HGB 12.7 13.7  HCT 38.2 40.4  MCV 95.5 94.0  PLT 196 588   Basic Metabolic Panel: Recent Labs  Lab 04/15/22 1712 04/16/22 0022  NA 141 141  K 4.3 3.9  CL 110 111  CO2 23 24  GLUCOSE 113* 163*  BUN 20 16  CREATININE 1.05* 1.04*  CALCIUM  8.5* 8.6*   GFR: Estimated Creatinine Clearance: 43.3 mL/min (A) (by C-G formula based on SCr of 1.04 mg/dL (H)). Liver Function Tests: No results for input(s): "AST", "ALT", "ALKPHOS", "BILITOT", "PROT", "ALBUMIN" in the last 168 hours. No results for input(s): "LIPASE", "AMYLASE" in the last 168 hours. No results for input(s): "AMMONIA" in the last 168 hours. Coagulation Profile: Recent Labs  Lab 04/15/22 1614  INR 1.0   Cardiac Enzymes: Recent Labs  Lab 04/15/22 1716  CKTOTAL 51   BNP (last 3 results) No results for input(s): "PROBNP" in the last 8760 hours. HbA1C: No results for input(s): "HGBA1C" in the last 72 hours. CBG: No results for input(s): "GLUCAP" in the last 168 hours. Lipid Profile: No results for input(s): "CHOL", "HDL", "LDLCALC", "TRIG", "CHOLHDL", "LDLDIRECT" in the last 72 hours. Thyroid Function Tests: No results for input(s): "TSH", "T4TOTAL", "FREET4", "T3FREE", "THYROIDAB" in the last 72 hours. Anemia Panel: No results for input(s): "VITAMINB12", "FOLATE", "FERRITIN", "TIBC", "IRON", "RETICCTPCT" in the last 72 hours. Sepsis Labs: No results for input(s): "PROCALCITON", "LATICACIDVEN" in the last 168 hours.  No results found for this or any previous visit (from the past 240 hour(s)).   Radiology Studies: DG Chest 1 View  Result Date: 04/15/2022 CLINICAL DATA:  [id number] EXAM: CHEST  1 VIEW COMPARISON:  10/31/2014 FINDINGS: Cardiac enlargement without heart failure. Lungs are clear without infiltrate or effusion. No acute skeletal abnormality. IMPRESSION: Prominent heart size without acute abnormality. Electronically Signed   By: Franchot Gallo M.D.   On: 04/15/2022 16:12   DG Hip Unilat With Pelvis 2-3 Views Left  Result Date: 04/15/2022 CLINICAL DATA:  Fall EXAM: DG HIP (WITH OR WITHOUT PELVIS) 2-3V LEFT COMPARISON:  None Available. FINDINGS: Subcapital femoral neck fracture left with impaction and mild displacement. Left hip joint normal. No other  fracture in the pelvis. Lumbar scoliosis and degenerative change. IMPRESSION: Subcapital fracture left femoral neck. Electronically Signed   By: Franchot Gallo M.D.   On: 04/15/2022 16:11    Scheduled Meds:  donepezil  10 mg Oral QHS   enoxaparin (LOVENOX) injection  40 mg Subcutaneous Q24H   levothyroxine  88 mcg Oral QAC breakfast   Continuous Infusions:  sodium chloride 75 mL/hr at 04/16/22 0537    LOS: 1 day   Time spent: 35mn  Galvin Aversa C Erven Ramson, DO [facility]  If 7PM-7AM, please contact night-coverage [email or web address]  04/16/2022, 7:55 AM

## 2022-04-16 NOTE — Anesthesia Pain Management Evaluation Note (Signed)
  Anesthesia Pain Consult Note  Patient: Terri Ortega, 81 y.o., female  Consult Requested by: Little Ishikawa, MD  Reason for Consult: left hip fracture  Level of Consciousness: alert  Pain: moderate   Last Vitals:  Vitals:   04/16/22 1030 04/16/22 1045  BP: (!) 175/62 (!) 163/71  Pulse: 61 60  Resp: 19 18  Temp:  37 C  SpO2: 96% 97%    Plan: Peripheral nerve block for pain control  Risks of wet tap, epidural hematoma and spinal cord injury explained to:   Consent:Risks of procedure as well as the alternatives and risks of each were explained to the (patient/caregiver).  Consent for procedure obtained.  Advance Directive:  Allergies  Allergen Reactions  . Percocet [Oxycodone-Acetaminophen] Nausea And Vomiting    Physical exam: PULM normal  CARDIO Heart regular rate and rhythm  OTHER    I have reviewed the patient's medications listed below. . donepezil  10 mg Oral QHS  . enoxaparin (LOVENOX) injection  40 mg Subcutaneous Q24H  . levothyroxine  88 mcg Oral QAC breakfast    hydrALAZINE, HYDROcodone-acetaminophen, morphine injection  Past Medical History:  Diagnosis Date  . Graves disease   . HTN (hypertension)   . Low back pain    Dr. Ronnald Ramp  . Osteoporosis   . Pneumonia 2012  . Radiation 03/20/14, 03/27/14, 04/05/14, 04/12/14, 04/17/14   bracytherapy to proximal vagina 30 gray  . Scoliosis   . Uterine cancer ([place])    surgery and radiation x 5 treatments-last 9'15  . Vitamin D deficiency    Past Surgical History:  Procedure Laterality Date  . BUNIONECTOMY     left  . CATARACT EXTRACTION, BILATERAL Bilateral    last done 11-21-14  . COLONOSCOPY WITH PROPOFOL N/A 12/25/2014   Procedure: COLONOSCOPY WITH PROPOFOL;  Surgeon: Garlan Fair, MD;  Location: [facility];  Service: Endoscopy;  Laterality: N/A;  . FOOT SURGERY Left 2004   Dr Johnnye Sima  . JOINT REPLACEMENT     RTKA  . ROBOTIC ASSISTED TOTAL HYSTERECTOMY WITH BILATERAL SALPINGO  OOPHERECTOMY  01/30/14   with lymph node biopsy  . TOTAL KNEE ARTHROPLASTY     right  . TOTAL KNEE REVISION Right 01/04/2019   Procedure: TOTAL KNEE REVISION;  Surgeon: Gaynelle Arabian, MD;  Location: [facility];  Service: Orthopedics;  Laterality: Right;  12mn with block    reports that she has never smoked. She has never used smokeless tobacco. She reports that she does not drink alcohol and does not use drugs.    CLidia Collum9/14/2023

## 2022-04-16 NOTE — Anesthesia Procedure Notes (Signed)
Anesthesia Regional Block: Femoral nerve block   Pre-Anesthetic Checklist: , timeout performed,  Correct Patient, Correct Site, Correct Laterality,  Correct Procedure, Correct Position, site marked,  Risks and benefits discussed,  Surgical consent,  Pre-op evaluation,  At surgeon's request and post-op pain management  Laterality: Left  Prep: chloraprep       Needles:  Injection technique: Single-shot  Needle Type: Echogenic Stimulator Needle     Needle Length: 10cm  Needle Gauge: 20     Additional Needles:   Procedures:,,,, ultrasound used (permanent image in chart),,    Narrative:  Start time: 04/16/2022 10:18 AM End time: 04/16/2022 10:29 AM Injection made incrementally with aspirations every 5 mL.  Performed by: Personally  Anesthesiologist: Lidia Collum, MD  Additional Notes: Standard monitors applied. Skin prepped. Good needle visualization with ultrasound. Injection made in 5cc increments with no resistance to injection. Patient tolerated the procedure well.

## 2022-04-16 NOTE — Progress Notes (Signed)
Came from ED last night, alert and oriented, but forgetful. Ice pack applied in left leg. NPO at [facility] for possible procedure today.

## 2022-04-17 ENCOUNTER — Inpatient Hospital Stay (HOSPITAL_COMMUNITY): Payer: Medicare PPO | Admitting: Anesthesiology

## 2022-04-17 ENCOUNTER — Encounter (HOSPITAL_COMMUNITY): Payer: Self-pay | Admitting: Internal Medicine

## 2022-04-17 ENCOUNTER — Ambulatory Visit: Payer: Self-pay | Admitting: Student

## 2022-04-17 ENCOUNTER — Inpatient Hospital Stay (HOSPITAL_COMMUNITY): Payer: Medicare PPO

## 2022-04-17 ENCOUNTER — Encounter (HOSPITAL_COMMUNITY)
Admission: EM | Disposition: A | Discharge status: Skilled Nursing Facility | Payer: Self-pay | Source: Home / Self Care | Attending: Internal Medicine

## 2022-04-17 DIAGNOSIS — S72012A Unspecified intracapsular fracture of left femur, initial encounter for closed fracture: Secondary | ICD-10-CM | POA: Diagnosis not present

## 2022-04-17 DIAGNOSIS — N1831 Chronic kidney disease, stage 3a: Secondary | ICD-10-CM | POA: Diagnosis not present

## 2022-04-17 DIAGNOSIS — S72002A Fracture of unspecified part of neck of left femur, initial encounter for closed fracture: Secondary | ICD-10-CM | POA: Diagnosis not present

## 2022-04-17 DIAGNOSIS — I129 Hypertensive chronic kidney disease with stage 1 through stage 4 chronic kidney disease, or unspecified chronic kidney disease: Secondary | ICD-10-CM | POA: Diagnosis not present

## 2022-04-17 HISTORY — PX: TOTAL HIP ARTHROPLASTY: SHX124

## 2022-04-17 LAB — BASIC METABOLIC PANEL
Anion gap: 8 (ref 5–15)
BUN: 16 mg/dL (ref 8–23)
CO2: 24 mmol/L (ref 22–32)
Calcium: 8.9 mg/dL (ref 8.9–10.3)
Chloride: 107 mmol/L (ref 98–111)
Creatinine, Ser: 0.8 mg/dL (ref 0.44–1.00)
GFR, Estimated: >60 mL/min (ref >60)
Glucose, Bld: 121 mg/dL — ABNORMAL HIGH (ref 70–99)
Potassium: 3.8 mmol/L (ref 3.5–5.1)
Sodium: 139 mmol/L (ref 135–145)

## 2022-04-17 LAB — CBC
HCT: 38.5 % (ref 36.0–46.0)
Hemoglobin: 13.3 g/dL (ref 12.0–15.0)
MCH: 31.5 pg (ref 26.0–34.0)
MCHC: 34.5 g/dL (ref 30.0–36.0)
MCV: 91.2 fL (ref 80.0–100.0)
Platelets: 168 10*3/uL (ref 150–400)
RBC: 4.22 MIL/uL (ref 3.87–5.11)
RDW: 12.5 % (ref 11.5–15.5)
WBC: 15.7 10*3/uL — ABNORMAL HIGH (ref 4.0–10.5)
nRBC: 0 % (ref 0.0–0.2)

## 2022-04-17 LAB — CALCIUM, IONIZED: Calcium, Ionized, Serum: 4.9 mg/dL (ref 4.5–5.6)

## 2022-04-17 LAB — SURGICAL PCR SCREEN
MRSA, PCR: NEGATIVE
Staphylococcus aureus: NEGATIVE

## 2022-04-17 SURGERY — ARTHROPLASTY, HIP, TOTAL, ANTERIOR APPROACH
Anesthesia: Spinal | Site: Hip | Laterality: Left

## 2022-04-17 MED ORDER — TRANEXAMIC ACID-NACL 1000-0.7 MG/100ML-% IV SOLN
INTRAVENOUS | Status: AC
  Filled 2022-04-17: qty 100

## 2022-04-17 MED ORDER — SENNA 8.6 MG PO TABS
1.0000 | ORAL_TABLET | Freq: Two times a day (BID) | ORAL | Status: DC
  Administered 2022-04-17 – 2022-04-23 (×11): 8.6 mg via ORAL
  Filled 2022-04-17 (×12): qty 1

## 2022-04-17 MED ORDER — HYDROCODONE-ACETAMINOPHEN 5-325 MG PO TABS
1.0000 | ORAL_TABLET | ORAL | Status: DC | PRN
  Administered 2022-04-17: 1 via ORAL
  Filled 2022-04-17: qty 1

## 2022-04-17 MED ORDER — FENTANYL CITRATE (PF) 250 MCG/5ML IJ SOLN
INTRAMUSCULAR | Status: AC
  Filled 2022-04-17: qty 5

## 2022-04-17 MED ORDER — CHLORHEXIDINE GLUCONATE 0.12 % MT SOLN
OROMUCOSAL | Status: AC
  Administered 2022-04-17: 15 mL via OROMUCOSAL
  Filled 2022-04-17: qty 15

## 2022-04-17 MED ORDER — POLYETHYLENE GLYCOL 3350 17 G PO PACK
17.0000 g | PACK | Freq: Every day | ORAL | Status: DC | PRN
  Administered 2022-04-18: 17 g via ORAL
  Filled 2022-04-17: qty 1

## 2022-04-17 MED ORDER — LACTATED RINGERS IV SOLN: INTRAVENOUS | Status: DC

## 2022-04-17 MED ORDER — MENTHOL 3 MG MT LOZG: 1.0000 | LOZENGE | OROMUCOSAL | Status: DC | PRN

## 2022-04-17 MED ORDER — PHENOL 1.4 % MT LIQD: 1.0000 | OROMUCOSAL | Status: DC | PRN

## 2022-04-17 MED ORDER — PHENYLEPHRINE HCL-NACL 20-0.9 MG/250ML-% IV SOLN
INTRAVENOUS | Status: DC | PRN
  Administered 2022-04-17: 50 ug/min via INTRAVENOUS

## 2022-04-17 MED ORDER — ACETAMINOPHEN 500 MG PO TABS
1000.0000 mg | ORAL_TABLET | Freq: Once | ORAL | Status: AC
  Administered 2022-04-17: 1000 mg via ORAL

## 2022-04-17 MED ORDER — DEXTROSE IN LACTATED RINGERS 5 % IV SOLN: INTRAVENOUS | Status: DC

## 2022-04-17 MED ORDER — SODIUM CHLORIDE (PF) 0.9 % IJ SOLN
INTRAMUSCULAR | Status: DC | PRN
  Administered 2022-04-17: 61 mL

## 2022-04-17 MED ORDER — METOCLOPRAMIDE HCL 5 MG/ML IJ SOLN: 5.0000 mg | Freq: Three times a day (TID) | INTRAMUSCULAR | Status: DC | PRN

## 2022-04-17 MED ORDER — DIPHENHYDRAMINE HCL 12.5 MG/5ML PO ELIX: 12.5000 mg | ORAL_SOLUTION | ORAL | Status: DC | PRN

## 2022-04-17 MED ORDER — ORAL CARE MOUTH RINSE: 15.0000 mL | Freq: Once | OROMUCOSAL | Status: AC

## 2022-04-17 MED ORDER — ONDANSETRON HCL 4 MG/2ML IJ SOLN
4.0000 mg | Freq: Four times a day (QID) | INTRAMUSCULAR | Status: DC | PRN
  Administered 2022-04-21: 4 mg via INTRAVENOUS
  Filled 2022-04-17: qty 2

## 2022-04-17 MED ORDER — POVIDONE-IODINE 10 % EX SWAB
2.0000 | Freq: Once | CUTANEOUS | Status: AC
  Administered 2022-04-17: 2 via TOPICAL

## 2022-04-17 MED ORDER — ASPIRIN 81 MG PO CHEW
81.0000 mg | CHEWABLE_TABLET | Freq: Two times a day (BID) | ORAL | Status: DC
  Administered 2022-04-17 – 2022-04-23 (×12): 81 mg via ORAL
  Filled 2022-04-17 (×12): qty 1

## 2022-04-17 MED ORDER — HYDROCODONE-ACETAMINOPHEN 7.5-325 MG PO TABS: 1.0000 | ORAL_TABLET | ORAL | Status: DC | PRN

## 2022-04-17 MED ORDER — TRANEXAMIC ACID-NACL 1000-0.7 MG/100ML-% IV SOLN: 1000.0000 mg | INTRAVENOUS | Status: DC

## 2022-04-17 MED ORDER — BUPIVACAINE-EPINEPHRINE (PF) 0.5% -1:200000 IJ SOLN
INTRAMUSCULAR | Status: AC
  Filled 2022-04-17: qty 30

## 2022-04-17 MED ORDER — CEFAZOLIN SODIUM-DEXTROSE 2-4 GM/100ML-% IV SOLN
INTRAVENOUS | Status: AC
  Filled 2022-04-17: qty 100

## 2022-04-17 MED ORDER — BUPIVACAINE IN DEXTROSE 0.75-8.25 % IT SOLN
INTRATHECAL | Status: DC | PRN
  Administered 2022-04-17: 12 mg via INTRATHECAL

## 2022-04-17 MED ORDER — SODIUM CHLORIDE 0.9 % IV SOLN: INTRAVENOUS | Status: DC

## 2022-04-17 MED ORDER — CEFAZOLIN SODIUM-DEXTROSE 2-4 GM/100ML-% IV SOLN
2.0000 g | Freq: Four times a day (QID) | INTRAVENOUS | Status: AC
  Administered 2022-04-17 – 2022-04-18 (×2): 2 g via INTRAVENOUS
  Filled 2022-04-17 (×2): qty 100

## 2022-04-17 MED ORDER — IRRISEPT - 450ML BOTTLE WITH 0.05% CHG IN STERILE WATER, USP 99.95% OPTIME
TOPICAL | Status: DC | PRN
  Administered 2022-04-17: 450 mL

## 2022-04-17 MED ORDER — SODIUM CHLORIDE (PF) 0.9 % IJ SOLN
INTRAMUSCULAR | Status: AC
  Filled 2022-04-17: qty 20

## 2022-04-17 MED ORDER — ACETAMINOPHEN 500 MG PO TABS: 1000.0000 mg | ORAL_TABLET | Freq: Once | ORAL | Status: DC

## 2022-04-17 MED ORDER — ONDANSETRON HCL 4 MG PO TABS: 4.0000 mg | ORAL_TABLET | Freq: Four times a day (QID) | ORAL | Status: DC | PRN

## 2022-04-17 MED ORDER — 0.9 % SODIUM CHLORIDE (POUR BTL) OPTIME
TOPICAL | Status: DC | PRN
  Administered 2022-04-17: 1000 mL

## 2022-04-17 MED ORDER — MORPHINE SULFATE (PF) 2 MG/ML IV SOLN: 0.5000 mg | INTRAVENOUS | Status: DC | PRN

## 2022-04-17 MED ORDER — CEFAZOLIN SODIUM-DEXTROSE 2-4 GM/100ML-% IV SOLN
2.0000 g | INTRAVENOUS | Status: AC
  Administered 2022-04-17: 2 g via INTRAVENOUS

## 2022-04-17 MED ORDER — FENTANYL CITRATE (PF) 100 MCG/2ML IJ SOLN: 25.0000 ug | INTRAMUSCULAR | Status: DC | PRN

## 2022-04-17 MED ORDER — EPHEDRINE SULFATE (PRESSORS) 50 MG/ML IJ SOLN
INTRAMUSCULAR | Status: DC | PRN
  Administered 2022-04-17: 5 mg via INTRAVENOUS

## 2022-04-17 MED ORDER — ONDANSETRON HCL 4 MG/2ML IJ SOLN: 4.0000 mg | Freq: Once | INTRAMUSCULAR | Status: DC | PRN

## 2022-04-17 MED ORDER — ALUM & MAG HYDROXIDE-SIMETH 200-200-20 MG/5ML PO SUSP: 30.0000 mL | ORAL | Status: DC | PRN

## 2022-04-17 MED ORDER — METOCLOPRAMIDE HCL 5 MG PO TABS: 5.0000 mg | ORAL_TABLET | Freq: Three times a day (TID) | ORAL | Status: DC | PRN

## 2022-04-17 MED ORDER — SODIUM CHLORIDE 0.9 % IR SOLN
Status: DC | PRN
  Administered 2022-04-17: 3000 mL

## 2022-04-17 MED ORDER — METHOCARBAMOL 1000 MG/10ML IJ SOLN: 500.0000 mg | Freq: Four times a day (QID) | INTRAVENOUS | Status: DC | PRN

## 2022-04-17 MED ORDER — FENTANYL CITRATE (PF) 250 MCG/5ML IJ SOLN
INTRAMUSCULAR | Status: DC | PRN
  Administered 2022-04-17: 25 ug via INTRAVENOUS

## 2022-04-17 MED ORDER — TRAMADOL HCL 50 MG PO TABS
50.0000 mg | ORAL_TABLET | Freq: Four times a day (QID) | ORAL | Status: DC
  Administered 2022-04-17 – 2022-04-23 (×24): 50 mg via ORAL
  Filled 2022-04-17 (×24): qty 1

## 2022-04-17 MED ORDER — CHLORHEXIDINE GLUCONATE 0.12 % MT SOLN: 15.0000 mL | Freq: Once | OROMUCOSAL | Status: AC

## 2022-04-17 MED ORDER — DOCUSATE SODIUM 100 MG PO CAPS
100.0000 mg | ORAL_CAPSULE | Freq: Two times a day (BID) | ORAL | Status: DC
  Administered 2022-04-17 – 2022-04-23 (×11): 100 mg via ORAL
  Filled 2022-04-17 (×11): qty 1

## 2022-04-17 MED ORDER — METHOCARBAMOL 500 MG PO TABS: 500.0000 mg | ORAL_TABLET | Freq: Four times a day (QID) | ORAL | Status: DC | PRN

## 2022-04-17 MED ORDER — PROPOFOL 10 MG/ML IV BOLUS
INTRAVENOUS | Status: DC | PRN
  Administered 2022-04-17: 10 mg via INTRAVENOUS
  Administered 2022-04-17: 30 mg via INTRAVENOUS

## 2022-04-17 MED ORDER — SODIUM CHLORIDE (PF) 0.9 % IJ SOLN
INTRAMUSCULAR | Status: AC
  Filled 2022-04-17: qty 10

## 2022-04-17 MED ORDER — CHLORHEXIDINE GLUCONATE 4 % EX LIQD: 60.0000 mL | Freq: Once | CUTANEOUS | Status: DC

## 2022-04-17 MED ORDER — ACETAMINOPHEN 325 MG PO TABS: 325.0000 mg | ORAL_TABLET | Freq: Four times a day (QID) | ORAL | Status: DC | PRN

## 2022-04-17 MED ORDER — KETOROLAC TROMETHAMINE 30 MG/ML IJ SOLN
INTRAMUSCULAR | Status: AC
  Filled 2022-04-17: qty 1

## 2022-04-17 MED ORDER — PROPOFOL 500 MG/50ML IV EMUL
INTRAVENOUS | Status: DC | PRN
  Administered 2022-04-17: 50 ug/kg/min via INTRAVENOUS

## 2022-04-17 SURGICAL SUPPLY — 62 items
ALCOHOL 70% 16 OZ (MISCELLANEOUS) ×1 IMPLANT
BAG COUNTER SPONGE SURGICOUNT (BAG) ×1 IMPLANT
BLADE CLIPPER SURG (BLADE) IMPLANT
CHLORAPREP W/TINT 26 (MISCELLANEOUS) ×1 IMPLANT
COVER SURGICAL LIGHT HANDLE (MISCELLANEOUS) ×1 IMPLANT
DERMABOND ADVANCED .7 DNX12 (GAUZE/BANDAGES/DRESSINGS) ×2 IMPLANT
DRAPE C-ARM 42X72 X-RAY (DRAPES) ×1 IMPLANT
DRAPE STERI IOBAN 125X83 (DRAPES) ×1 IMPLANT
DRAPE U-SHAPE 47X51 STRL (DRAPES) ×3 IMPLANT
DRSG AQUACEL AG ADV 3.5X10 (GAUZE/BANDAGES/DRESSINGS) ×1 IMPLANT
ELECT BLADE 4.0 EZ CLEAN MEGAD (MISCELLANEOUS) ×1
ELECT PENCIL ROCKER SW 15FT (MISCELLANEOUS) ×1 IMPLANT
ELECT REM PT RETURN 9FT ADLT (ELECTROSURGICAL) ×1
ELECTRODE BLDE 4.0 EZ CLN MEGD (MISCELLANEOUS) ×1 IMPLANT
ELECTRODE REM PT RTRN 9FT ADLT (ELECTROSURGICAL) ×1 IMPLANT
EVACUATOR 1/8 PVC DRAIN (DRAIN) IMPLANT
GLOVE BIO SURGEON STRL SZ8.5 (GLOVE) ×2 IMPLANT
GLOVE BIOGEL M 7.0 STRL (GLOVE) ×1 IMPLANT
GLOVE BIOGEL PI IND STRL 7.5 (GLOVE) ×1 IMPLANT
GLOVE BIOGEL PI IND STRL 8.5 (GLOVE) ×1 IMPLANT
GOWN STRL REUS W/ TWL LRG LVL3 (GOWN DISPOSABLE) ×2 IMPLANT
GOWN STRL REUS W/ TWL XL LVL3 (GOWN DISPOSABLE) ×1 IMPLANT
GOWN STRL REUS W/TWL 2XL LVL3 (GOWN DISPOSABLE) ×1 IMPLANT
GOWN STRL REUS W/TWL LRG LVL3 (GOWN DISPOSABLE) ×2
GOWN STRL REUS W/TWL XL LVL3 (GOWN DISPOSABLE) ×1
HANDPIECE INTERPULSE COAX TIP (DISPOSABLE) ×1
HEAD FEM +3XOFST 36XMDLR (Orthopedic Implant) IMPLANT
HEAD MODULAR 36MM (Orthopedic Implant) ×1 IMPLANT
HOOD PEEL AWAY FACE SHEILD DIS (HOOD) ×2 IMPLANT
JET LAVAGE IRRISEPT WOUND (IRRIGATION / IRRIGATOR) ×1
KIT BASIN OR (CUSTOM PROCEDURE TRAY) ×1 IMPLANT
KIT TURNOVER KIT B (KITS) ×1 IMPLANT
LAVAGE JET IRRISEPT WOUND (IRRIGATION / IRRIGATOR) ×1 IMPLANT
LINER ACETAB ~~LOC~~ D 36 (Liner) IMPLANT
MANIFOLD NEPTUNE II (INSTRUMENTS) ×1 IMPLANT
MARKER SKIN DUAL TIP RULER LAB (MISCELLANEOUS) ×2 IMPLANT
NDL SPNL 18GX3.5 QUINCKE PK (NEEDLE) ×1 IMPLANT
NEEDLE SPNL 18GX3.5 QUINCKE PK (NEEDLE) ×1 IMPLANT
NS IRRIG 1000ML POUR BTL (IV SOLUTION) ×1 IMPLANT
PACK TOTAL JOINT (CUSTOM PROCEDURE TRAY) ×1 IMPLANT
PACK UNIVERSAL I (CUSTOM PROCEDURE TRAY) ×1 IMPLANT
PAD ARMBOARD 7.5X6 YLW CONV (MISCELLANEOUS) ×2 IMPLANT
SAW OSC TIP CART 19.5X105X1.3 (SAW) ×1 IMPLANT
SEALER BIPOLAR AQUA 6.0 (INSTRUMENTS) IMPLANT
SET HNDPC FAN SPRY TIP SCT (DISPOSABLE) ×1 IMPLANT
SHELL ACET G7 3H 50 SZD (Shell) IMPLANT
SOL PREP POV-IOD 4OZ 10% (MISCELLANEOUS) ×1 IMPLANT
STEM FEM CMTLS 16X152 133D (Stem) IMPLANT
SUT ETHIBOND NAB CT1 #1 30IN (SUTURE) ×2 IMPLANT
SUT MNCRL AB 3-0 PS2 18 (SUTURE) ×1 IMPLANT
SUT MON AB 2-0 CT1 36 (SUTURE) ×1 IMPLANT
SUT VIC AB 2-0 CT1 27 (SUTURE) ×1
SUT VIC AB 2-0 CT1 TAPERPNT 27 (SUTURE) ×1 IMPLANT
SUT VLOC 180 0 24IN GS25 (SUTURE) ×1 IMPLANT
SYR 50ML LL SCALE MARK (SYRINGE) ×1 IMPLANT
TOWEL GREEN STERILE (TOWEL DISPOSABLE) ×1 IMPLANT
TOWEL GREEN STERILE FF (TOWEL DISPOSABLE) ×1 IMPLANT
TRAY CATH 16FR W/PLASTIC CATH (SET/KITS/TRAYS/PACK) IMPLANT
TRAY FOLEY W/BAG SLVR 16FR (SET/KITS/TRAYS/PACK) ×1
TRAY FOLEY W/BAG SLVR 16FR ST (SET/KITS/TRAYS/PACK) IMPLANT
TUBE SUCT ARGYLE STRL (TUBING) ×1 IMPLANT
WATER STERILE IRR 1000ML POUR (IV SOLUTION) ×3 IMPLANT

## 2022-04-17 NOTE — TOC CAGE-AID Note (Signed)
Transition of Care [facility]) - CAGE-AID Screening   Patient Details  Name: Terri Ortega MRN: [id number] Date of Birth: 1940/10/27  Transition of Care [facility]) CM/SW Contact:    Army Melia, RN Phone [phone] 04/17/2022, 10:40 PM    CAGE-AID Screening:    Have You Ever Felt You Ought to Cut Down on Your Drinking or Drug Use?: No Have People Annoyed You By Critizing Your Drinking Or Drug Use?: No Have You Felt Bad Or Guilty About Your Drinking Or Drug Use?: No Have You Ever Had a Drink or Used Drugs First Thing In The Morning to Steady Your Nerves or to Get Rid of a Hangover?: No CAGE-AID Score: 0  Substance Abuse Education Offered: No (reports no alcohol or drug use)

## 2022-04-17 NOTE — Care Management Important Message (Signed)
Important Message  Patient Details  Name: Terri Ortega MRN: [id number] Date of Birth: 1940/11/22   Medicare Important Message Given:  Yes     Hannah Beat 04/17/2022, 12:18 PM

## 2022-04-17 NOTE — Anesthesia Procedure Notes (Signed)
Spinal  Patient location during procedure: OR End time: 04/17/2022 5:48 PM Reason for block: surgical anesthesia Staffing Performed: anesthesiologist  Anesthesiologist: Annye Asa, MD Performed by: Annye Asa, MD Authorized by: Annye Asa, MD   Preanesthetic Checklist Completed: patient identified, IV checked, site marked, risks and benefits discussed, surgical consent, monitors and equipment checked, pre-op evaluation and timeout performed Spinal Block Patient position: sitting Prep: DuraPrep and site prepped and draped Patient monitoring: heart rate, cardiac monitor, continuous pulse ox and blood pressure Approach: midline Location: L3-4 Injection technique: single-shot Needle Needle type: Pencan and Introducer  Needle gauge: 24 G Needle length: 9 cm Assessment Events: CSF return Additional Notes Pt identified in Operating room.  Monitors applied. Working IV access confirmed. Sterile prep, drape lumbar spine.  1% lido local L 3,4.  #24ga Pencan into clear CSF L 3,4.  '12mg'$  0.75% Bupivacaine with dextrose injected with asp CSF beginning and end of injection.  Patient asymptomatic, VSS, no heme aspirated, tolerated well.  Jenita Seashore, MD

## 2022-04-17 NOTE — Anesthesia Preprocedure Evaluation (Addendum)
Anesthesia Evaluation  Patient identified by MRN, date of birth, ID band Patient awake    Reviewed: Allergy & Precautions, NPO status , Patient's Chart, lab work & pertinent test results  History of Anesthesia Complications Negative for: history of anesthetic complications  Airway Mallampati: II  TM Distance: >3 FB Neck ROM: Full    Dental  (+) Dental Advisory Given, Teeth Intact   Pulmonary neg pulmonary ROS,    Pulmonary exam normal        Cardiovascular hypertension, Pt. on medications +CHF  Normal cardiovascular exam   '17 TTE - EF 55% to 60%. Grade 1 diastolic dysfunction. Mild AI. PASP was mildly increased. PA peak pressure: 39 mm Hg (S).     Neuro/Psych  Mild cognitive impairment     GI/Hepatic negative GI ROS, Neg liver ROS,   Endo/Other  Hypothyroidism  Obesity Graves disease   Renal/GU CRFRenal disease     Musculoskeletal  (+) Arthritis ,   Abdominal   Peds  Hematology negative hematology ROS (+)   Anesthesia Other Findings   Reproductive/Obstetrics  Hx uterine cancer s/p hysterectomy                             Anesthesia Physical Anesthesia Plan  ASA: 3  Anesthesia Plan: Spinal   Post-op Pain Management: Tylenol PO (pre-op)*   Induction:   PONV Risk Score and Plan: 2 and Treatment may vary due to age or medical condition and Propofol infusion  Airway Management Planned: Natural Airway and Simple Face Mask  Additional Equipment: None  Intra-op Plan:   Post-operative Plan:   Informed Consent: I have reviewed the patients History and Physical, chart, labs and discussed the procedure including the risks, benefits and alternatives for the proposed anesthesia with the patient or authorized representative who has indicated his/her understanding and acceptance.       Plan Discussed with: CRNA and Anesthesiologist  Anesthesia Plan Comments: (Labs reviewed,  platelets acceptable. Discussed risks and benefits of spinal, including spinal/epidural hematoma, infection, failed block, and PDPH. Patient expressed understanding and wished to proceed. )       Anesthesia Quick Evaluation

## 2022-04-17 NOTE — Transfer of Care (Signed)
Immediate Anesthesia Transfer of Care Note  Patient: Terri Ortega  Procedure(s) Performed: TOTAL HIP ARTHROPLASTY ANTERIOR APPROACH (Left: Hip)  Patient Location: PACU  Anesthesia Type:Spinal  Level of Consciousness: awake  Airway & Oxygen Therapy: Patient Spontanous Breathing  Post-op Assessment: Report given to RN and Post -op Vital signs reviewed and stable  Post vital signs: Reviewed and stable  Last Vitals:  Vitals Value Taken Time  BP 133/59 04/17/22 1930  Temp 37.1 C 04/17/22 1930  Pulse 51 04/17/22 1932  Resp 15 04/17/22 1932  SpO2 95 % 04/17/22 1932  Vitals shown include unvalidated device data.  Last Pain:  Vitals:   04/17/22 1605  TempSrc:   PainSc: 0-No pain      Patients Stated Pain Goal: 0 (94/47/39 5844)  Complications: No notable events documented.

## 2022-04-17 NOTE — Discharge Instructions (Signed)
? ?Dr. Brian Swinteck ?Joint Replacement Specialist ?[facility] ?[address] ?, [place] [address] ?[phone] ? ? ?TOTAL HIP REPLACEMENT POSTOPERATIVE DIRECTIONS ? ? ? ?Hip Rehabilitation, Guidelines Following Surgery  ? ?WEIGHT BEARING ?Weight bearing as tolerated with assist device (walker, cane, etc) as directed, use it as long as suggested by your surgeon or therapist, typically at least 4-6 weeks. ? ?The results of a hip operation are greatly improved after range of motion and muscle strengthening exercises. Follow all safety measures which are given to protect your hip. If any of these exercises cause increased pain or swelling in your joint, decrease the amount until you are comfortable again. Then slowly increase the exercises. Call your caregiver if you have problems or questions.  ? ?HOME CARE INSTRUCTIONS  ?Most of the following instructions are designed to prevent the dislocation of your new hip.  ?Remove items at home which could result in a fall. This includes throw rugs or furniture in walking pathways.  ?Continue medications as instructed at time of discharge. ?You may have some home medications which will be placed on hold until you complete the course of blood thinner medication. ?You may start showering once you are discharged home. Do not remove your dressing. ?Do not put on socks or shoes without following the instructions of your caregivers.   ?Sit on chairs with arms. Use the chair arms to help push yourself up when arising.  ?Arrange for the use of a toilet seat elevator so you are not sitting low.  ?Walk with walker as instructed.  ?You may resume a sexual relationship in one month or when given the OK by your caregiver.  ?Use walker as long as suggested by your caregivers.  ?You may put full weight on your legs and walk as much as is comfortable. ?Avoid periods of inactivity such as sitting longer than an hour when not asleep. This helps prevent blood  clots.  ?You may return to work once you are cleared by your surgeon.  ?Do not drive a car for 6 weeks or until released by your surgeon.  ?Do not drive while taking narcotics.  ?Wear elastic stockings for two weeks following surgery during the day but you may remove then at night.  ?Make sure you keep all of your appointments after your operation with all of your doctors and caregivers. You should call the office at the above phone number and make an appointment for approximately two weeks after the date of your surgery. ?Please pick up a stool softener and laxative for home use as long as you are requiring pain medications. ?ICE to the affected hip every three hours for 30 minutes at a time and then as needed for pain and swelling. Continue to use ice on the hip for pain and swelling from surgery. You may notice swelling that will progress down to the foot and ankle.  This is normal after surgery.  Elevate the leg when you are not up walking on it.   ?It is important for you to complete the blood thinner medication as prescribed by your doctor. ?Continue to use the breathing machine which will help keep your temperature down.  It is common for your temperature to cycle up and down following surgery, especially at night when you are not up moving around and exerting yourself.  The breathing machine keeps your lungs expanded and your temperature down. ? ?RANGE OF MOTION AND STRENGTHENING EXERCISES  ?These exercises are designed to help you   keep full movement of your hip joint. Follow your caregiver's or physical therapist's instructions. Perform all exercises about fifteen times, three times per day or as directed. Exercise both hips, even if you have had only one joint replacement. These exercises can be done on a training (exercise) mat, on the floor, on a table or on a bed. Use whatever works the best and is most comfortable for you. Use music or television while you are exercising so that the exercises are a  pleasant break in your day. This will make your life better with the exercises acting as a break in routine you can look forward to.  ?Lying on your back, slowly slide your foot toward your buttocks, raising your knee up off the floor. Then slowly slide your foot back down until your leg is straight again.  ?Lying on your back spread your legs as far apart as you can without causing discomfort.  ?Lying on your side, raise your upper leg and foot straight up from the floor as far as is comfortable. Slowly lower the leg and repeat.  ?Lying on your back, tighten up the muscle in the front of your thigh (quadriceps muscles). You can do this by keeping your leg straight and trying to raise your heel off the floor. This helps strengthen the largest muscle supporting your knee.  ?Lying on your back, tighten up the muscles of your buttocks both with the legs straight and with the knee bent at a comfortable angle while keeping your heel on the floor.  ? ?SKILLED REHAB INSTRUCTIONS: ?If the patient is transferred to a skilled rehab facility following release from the hospital, a list of the current medications will be sent to the facility for the patient to continue.  When discharged from the skilled rehab facility, please have the facility set up the patient's Home Health Physical Therapy prior to being released. Also, the skilled facility will be responsible for providing the patient with their medications at time of release from the facility to include their pain medication and their blood thinner medication. If the patient is still at the rehab facility at time of the two week follow up appointment, the skilled rehab facility will also need to assist the patient in arranging follow up appointment in our office and any transportation needs. ? ?POST-OPERATIVE OPIOID TAPER INSTRUCTIONS: ?It is important to wean off of your opioid medication as soon as possible. If you do not need pain medication after your surgery it is ok  to stop day one. ?Opioids include: ?Codeine, Hydrocodone(Norco, Vicodin), Oxycodone(Percocet, oxycontin) and hydromorphone amongst others.  ?Long term and even short term use of opiods can cause: ?Increased pain response ?Dependence ?Constipation ?Depression ?Respiratory depression ?And more.  ?Withdrawal symptoms can include ?Flu like symptoms ?Nausea, vomiting ?And more ?Techniques to manage these symptoms ?Hydrate well ?Eat regular healthy meals ?Stay active ?Use relaxation techniques(deep breathing, meditating, yoga) ?Do Not substitute Alcohol to help with tapering ?If you have been on opioids for less than two weeks and do not have pain than it is ok to stop all together.  ?Plan to wean off of opioids ?This plan should start within one week post op of your joint replacement. ?Maintain the same interval or time between taking each dose and first decrease the dose.  ?Cut the total daily intake of opioids by one tablet each day ?Next start to increase the time between doses. ?The last dose that should be eliminated is the evening dose.  ? ? ?MAKE   SURE YOU:  ?Understand these instructions.  ?Will watch your condition.  ?Will get help right away if you are not doing well or get worse. ? ?Pick up stool softner and laxative for home use following surgery while on pain medications. ?Do not remove your dressing. ?The dressing is waterproof--it is OK to take showers. ?Continue to use ice for pain and swelling after surgery. ?Do not use any lotions or creams on the incision until instructed by your surgeon. ?Total Hip Protocol. ? ?

## 2022-04-17 NOTE — H&P (View-Only) (Signed)
ORTHOPAEDIC CONSULTATION  REQUESTING PHYSICIAN: Little Ishikawa, MD  PCP:  Lawerance Cruel, MD  Chief Complaint: left hip injury   HPI: Terri Ortega is a 81 y.o. female with medical history significant of pmh HTN, hypothyroidism, mild cognitive impairment, and osteoporosis who presented to the ED 04/15/22 for evaluation of left hip pain after a mechanical fall. She was standing in her driveway when her grandson ran up to hug her and accidentally knocked her over. She fell onto her left side and hip with immediate pain and inability to bear weight. She did not hit her head and denies LOC. She denies history of anticoagulation. X-rays showed a displaced left femoral neck fracture. Orthopedics was consulted.   She lives at home with her husband. She normally ambulates with a cane at baseline.   Past Medical History:  Diagnosis Date   Graves disease    HTN (hypertension)    Low back pain    Dr. Ronnald Ramp   Osteoporosis    Pneumonia 2012   Radiation 03/20/14, 03/27/14, 04/05/14, 04/12/14, 04/17/14   bracytherapy to proximal vagina 30 gray   Scoliosis    Uterine cancer ([place])    surgery and radiation x 5 treatments-last 9'15   Vitamin D deficiency    Past Surgical History:  Procedure Laterality Date   BUNIONECTOMY     left   CATARACT EXTRACTION, BILATERAL Bilateral    last done 11-21-14   COLONOSCOPY WITH PROPOFOL N/A 12/25/2014   Procedure: COLONOSCOPY WITH PROPOFOL;  Surgeon: Garlan Fair, MD;  Location: [facility];  Service: Endoscopy;  Laterality: N/A;   FOOT SURGERY Left 2004   Dr Johnnye Sima   JOINT REPLACEMENT     RTKA   ROBOTIC ASSISTED TOTAL HYSTERECTOMY WITH BILATERAL SALPINGO OOPHERECTOMY  01/30/14   with lymph node biopsy   TOTAL KNEE ARTHROPLASTY     right   TOTAL KNEE REVISION Right 01/04/2019   Procedure: TOTAL KNEE REVISION;  Surgeon: Gaynelle Arabian, MD;  Location: [facility];  Service: Orthopedics;  Laterality: Right;  149mn with block   Social History    Socioeconomic History   Marital status: Married    Spouse name: Not on file   Number of children: 3   Years of education: Not on file   Highest education level: High school graduate  Occupational History   Occupation: aChartered certified accountant Tobacco Use   Smoking status: Never   Smokeless tobacco: Never  Vaping Use   Vaping Use: Never used  Substance and Sexual Activity   Alcohol use: Never   Drug use: Never   Sexual activity: Not Currently  Other Topics Concern   Not on file  Social History Narrative   Lives at home with husband, BRush Landmark  Four grand and five great grands.     Caffeine: diet coke about 1-2 per day    Right handed   Social Determinants of Health   Financial Resource Strain: Not on file  Food Insecurity: Not on file  Transportation Needs: Not on file  Physical Activity: Not on file  Stress: Not on file  Social Connections: Not on file   Family History  Adopted: Yes  Problem Relation Age of Onset   Heart disease Mother 649      No details   Memory loss Neg Hx    Dementia Neg Hx    Allergies  Allergen Reactions   Percocet [Oxycodone-Acetaminophen] Nausea And Vomiting   Prior to Admission medications   Medication Sig  Start Date End Date Taking? Authorizing Provider  CALCIUM PO Take 1 tablet by mouth daily.   Yes [provider]  Cholecalciferol (VITAMIN D3 PO) Take 2,000 Int'l Units by mouth daily.   Yes [provider]  donepezil (ARICEPT) 10 MG tablet Take 1 tablet (10 mg total) by mouth at bedtime. 05/21/21  Yes Melvenia Beam, MD  ibandronate (BONIVA) 150 MG tablet Take 150 mg by mouth every 30 (thirty) days. Pt takes on the 25th of each month   Yes [provider]  levothyroxine (SYNTHROID) 88 MCG tablet Take 88 mcg by mouth daily before breakfast.  10/02/13  Yes [provider]  amLODipine (NORVASC) 2.5 MG tablet Take 1 tablet by mouth once daily Patient not taking: Reported on 04/15/2022 01/01/22   Minus Breeding, MD   DG Knee Left Port  Result Date: 04/16/2022 CLINICAL DATA:  Fracture left femoral neck. EXAM: PORTABLE LEFT KNEE - 1-2 VIEW COMPARISON:  None Available. FINDINGS: Tiny joint effusion. Mild medial compartment joint space narrowing. No acute fracture is seen. No dislocation. IMPRESSION: Mild medial compartment osteoarthritis. Electronically Signed   By: Yvonne Kendall M.D.   On: 04/16/2022 14:42    Positive ROS: All other systems have been reviewed and were otherwise negative with the exception of those mentioned in the HPI and as above.  Physical Exam: General: Alert, no acute distress Cardiovascular: No pedal edema Respiratory: No cyanosis, no use of accessory musculature GI: No organomegaly, abdomen is soft and non-tender Skin: No lesions in the area of chief complaint Psychiatric: Patient is competent for consent with normal mood and affect Lymphatic: No axillary or cervical lymphadenopathy  MUSCULOSKELETAL: Examination of the left hip reveals no skin wounds, lesions, rashes, or erythema. She has pain with hip motion. Slight ER and shortening noted.   Sensory and motor function intact including plantar flexion, dorsiflexion, and EHL. Distal pedal pulses 2+ bilaterally. Capillary refill < 2 seconds. No significant pedal edema. Calves soft and non-tender.    Assessment: Displaced left femoral neck fracture.   Plan: I discussed the findings with the patient. She has an unstable left femoral neck fracture that will require surgical treatment for pain control and allow immediate mobilization out of bed. Patient under [facility] admission for perioperative medical optimization. Plan for left total hip arthroplasty today. Last dose of lovenox 04/16/22 at 2134. Patient has been NPO since mn 04/16/22. All questions solicited and answered.     Charlott Rakes, PA-C    04/17/2022 4:11 PM

## 2022-04-17 NOTE — Consult Note (Signed)
ORTHOPAEDIC CONSULTATION  REQUESTING PHYSICIAN: Little Ishikawa, MD  PCP:  Lawerance Cruel, MD  Chief Complaint: left hip injury   HPI: Terri Ortega is a 81 y.o. female with medical history significant of pmh HTN, hypothyroidism, mild cognitive impairment, and osteoporosis who presented to the ED 04/15/22 for evaluation of left hip pain after a mechanical fall. She was standing in her driveway when her grandson ran up to hug her and accidentally knocked her over. She fell onto her left side and hip with immediate pain and inability to bear weight. She did not hit her head and denies LOC. She denies history of anticoagulation. X-rays showed a displaced left femoral neck fracture. Orthopedics was consulted.   She lives at home with her husband. She normally ambulates with a cane at baseline.   Past Medical History:  Diagnosis Date   Graves disease    HTN (hypertension)    Low back pain    Dr. Ronnald Ramp   Osteoporosis    Pneumonia 2012   Radiation 03/20/14, 03/27/14, 04/05/14, 04/12/14, 04/17/14   bracytherapy to proximal vagina 30 gray   Scoliosis    Uterine cancer ([place])    surgery and radiation x 5 treatments-last 9'15   Vitamin D deficiency    Past Surgical History:  Procedure Laterality Date   BUNIONECTOMY     left   CATARACT EXTRACTION, BILATERAL Bilateral    last done 11-21-14   COLONOSCOPY WITH PROPOFOL N/A 12/25/2014   Procedure: COLONOSCOPY WITH PROPOFOL;  Surgeon: Garlan Fair, MD;  Location: [facility];  Service: Endoscopy;  Laterality: N/A;   FOOT SURGERY Left 2004   Dr Johnnye Sima   JOINT REPLACEMENT     RTKA   ROBOTIC ASSISTED TOTAL HYSTERECTOMY WITH BILATERAL SALPINGO OOPHERECTOMY  01/30/14   with lymph node biopsy   TOTAL KNEE ARTHROPLASTY     right   TOTAL KNEE REVISION Right 01/04/2019   Procedure: TOTAL KNEE REVISION;  Surgeon: Gaynelle Arabian, MD;  Location: [facility];  Service: Orthopedics;  Laterality: Right;  1104mn with block   Social History    Socioeconomic History   Marital status: Married    Spouse name: Not on file   Number of children: 3   Years of education: Not on file   Highest education level: High school graduate  Occupational History   Occupation: aChartered certified accountant Tobacco Use   Smoking status: Never   Smokeless tobacco: Never  Vaping Use   Vaping Use: Never used  Substance and Sexual Activity   Alcohol use: Never   Drug use: Never   Sexual activity: Not Currently  Other Topics Concern   Not on file  Social History Narrative   Lives at home with husband, BRush Landmark  Four grand and five great grands.     Caffeine: diet coke about 1-2 per day    Right handed   Social Determinants of Health   Financial Resource Strain: Not on file  Food Insecurity: Not on file  Transportation Needs: Not on file  Physical Activity: Not on file  Stress: Not on file  Social Connections: Not on file   Family History  Adopted: Yes  Problem Relation Age of Onset   Heart disease Mother 65      No details   Memory loss Neg Hx    Dementia Neg Hx    Allergies  Allergen Reactions   Percocet [Oxycodone-Acetaminophen] Nausea And Vomiting   Prior to Admission medications   Medication Sig  Start Date End Date Taking? Authorizing Provider  CALCIUM PO Take 1 tablet by mouth daily.   Yes [provider]  Cholecalciferol (VITAMIN D3 PO) Take 2,000 Int'l Units by mouth daily.   Yes [provider]  donepezil (ARICEPT) 10 MG tablet Take 1 tablet (10 mg total) by mouth at bedtime. 05/21/21  Yes Melvenia Beam, MD  ibandronate (BONIVA) 150 MG tablet Take 150 mg by mouth every 30 (thirty) days. Pt takes on the 25th of each month   Yes [provider]  levothyroxine (SYNTHROID) 88 MCG tablet Take 88 mcg by mouth daily before breakfast.  10/02/13  Yes [provider]  amLODipine (NORVASC) 2.5 MG tablet Take 1 tablet by mouth once daily Patient not taking: Reported on 04/15/2022 01/01/22   Minus Breeding, MD   DG Knee Left Port  Result Date: 04/16/2022 CLINICAL DATA:  Fracture left femoral neck. EXAM: PORTABLE LEFT KNEE - 1-2 VIEW COMPARISON:  None Available. FINDINGS: Tiny joint effusion. Mild medial compartment joint space narrowing. No acute fracture is seen. No dislocation. IMPRESSION: Mild medial compartment osteoarthritis. Electronically Signed   By: Yvonne Kendall M.D.   On: 04/16/2022 14:42    Positive ROS: All other systems have been reviewed and were otherwise negative with the exception of those mentioned in the HPI and as above.  Physical Exam: General: Alert, no acute distress Cardiovascular: No pedal edema Respiratory: No cyanosis, no use of accessory musculature GI: No organomegaly, abdomen is soft and non-tender Skin: No lesions in the area of chief complaint Psychiatric: Patient is competent for consent with normal mood and affect Lymphatic: No axillary or cervical lymphadenopathy  MUSCULOSKELETAL: Examination of the left hip reveals no skin wounds, lesions, rashes, or erythema. She has pain with hip motion. Slight ER and shortening noted.   Sensory and motor function intact including plantar flexion, dorsiflexion, and EHL. Distal pedal pulses 2+ bilaterally. Capillary refill < 2 seconds. No significant pedal edema. Calves soft and non-tender.    Assessment: Displaced left femoral neck fracture.   Plan: I discussed the findings with the patient. She has an unstable left femoral neck fracture that will require surgical treatment for pain control and allow immediate mobilization out of bed. Patient under [facility] admission for perioperative medical optimization. Plan for left total hip arthroplasty today. Last dose of lovenox 04/16/22 at 2134. Patient has been NPO since mn 04/16/22. All questions solicited and answered.     Charlott Rakes, PA-C    04/17/2022 4:11 PM

## 2022-04-17 NOTE — Progress Notes (Signed)
At the start of shift patient is pleasantly conversing, bit confused. Then before midnight she got bit more confused she said she is waiting for RN and NT to come back, and if she ask what she need, she does not need anything. Refused pain med. She said she hardly know where she is, and she feel she is in the cage.

## 2022-04-17 NOTE — Interval H&P Note (Signed)
History and Physical Interval Note:  04/17/2022 4:45 PM  Terri Ortega  has presented today for surgery, with the diagnosis of LEFT HIP FRACTURE.  The various methods of treatment have been discussed with the patient and family. After consideration of risks, benefits and other options for treatment, the patient has consented to  Procedure(s): TOTAL HIP ARTHROPLASTY ANTERIOR APPROACH (Left) as a surgical intervention.  The patient's history has been reviewed, patient examined, no change in status, stable for surgery.  I have reviewed the patient's chart and labs.  Questions were answered to the patient's satisfaction.    The risks, benefits, and alternatives were discussed with the patient. There are risks associated with the surgery including, but not limited to, problems with anesthesia (death), infection, instability (giving out of the joint), dislocation, differences in leg length/angulation/rotation, fracture of bones, loosening or failure of implants, hematoma (blood accumulation) which may require surgical drainage, blood clots, pulmonary embolism, nerve injury (foot drop and lateral thigh numbness), and blood vessel injury. The patient understands these risks and elects to proceed.    H[place]

## 2022-04-17 NOTE — Op Note (Signed)
OPERATIVE REPORT  SURGEON: Rod Can, MD   ASSISTANT: Larene Pickett, PA-C  PREOPERATIVE DIAGNOSIS: Displaced Left femoral neck fracture.   POSTOPERATIVE DIAGNOSIS: Displaced Left femoral neck fracture.   PROCEDURE: Left total hip arthroplasty, anterior approach.   IMPLANTS: Biomet Taperloc Reduced Distal stem, size 16 x 115m, high offset. Biomet G7 OsseoTi Cup, size 50 mm. Biomet Vivacit-E liner, size 36 mm, D, neutral. Biomet Biolox ceramic head ball, size 36 + 3 mm.  ANESTHESIA:  MAC and Spinal  ANTIBIOTICS: g ancef.  ESTIMATED BLOOD LOSS:-200 mL    DRAINS: None.  COMPLICATIONS: None   CONDITION: PACU - hemodynamically stable.   BRIEF CLINICAL NOTE: Terri Ortega is a 81y.o. female with a displaced Left femoral neck fracture. The patient was admitted to the hospitalist service and underwent perioperative risk stratification and medical optimization. The risks, benefits, and alternatives to total hip arthroplasty were explained, and the patient elected to proceed.  PROCEDURE IN DETAIL: The patient was taken to the operating room and general anesthesia was induced on the hospital bed.  The patient was then positioned on the Hana table.  All bony prominences were well padded.  The hip was prepped and draped in the normal sterile surgical fashion.  A time-out was called verifying side and site of surgery. Antibiotics were given within 60 minutes of beginning the procedure.   Bikini incision was made, and the direct anterior approach to the hip was performed through the Hueter interval.  Lateral femoral circumflex vessels were treated with the Auqumantys. The anterior capsule was exposed and an inverted T capsulotomy was made.  Fracture hematoma was encountered and evacuated. The patient was found to have a comminuted Left subcapital femoral neck fracture.  I freshened the femoral neck cut with a saw.  I removed the femoral neck fragment.  A corkscrew was placed into the head  and the head was removed.  This was passed to the back table and was measured. The pubofemoral ligament was released subperiosteally to the lesser trochanter.  Acetabular exposure was achieved, and the pulvinar and labrum were excised. Sequential reaming of the acetabulum was then performed up to a size 49 mm reamer under direct visulization. A 50 mm cup was then opened and impacted into place at approximately 40 degrees of abduction and 20 degrees of anteversion. The final polyethylene liner was impacted into place and acetabular osteophytes were removed.    I then gained femoral exposure taking care to protect the abductors and greater trochanter.  This was performed using standard external rotation, extension, and adduction.  A cookie cutter was used to enter the femoral canal, and then the femoral canal finder was placed.  Sequential broaching was performed up to a size 16.  Calcar planer was used on the femoral neck remnant.  I placed a high offset neck and a trial head ball.  The hip was reduced.  Leg lengths and offset were checked fluoroscopically.  The hip was dislocated and trial components were removed.  The final implants were placed, and the hip was reduced.  Fluoroscopy was used to confirm component position and leg lengths.  At 90 degrees of external rotation and full extension, the hip was stable to an anterior directed force.   The wound was copiously irrigated with Irrisept solution and normal saline using pule lavage.  Marcaine solution was injected into the periarticular soft tissue.  The wound was closed in layers using #1 Stratafix for the fascia, 2-0 Vicryl for the subcutaneous fat,  2-0 Monocryl for the deep dermal layer, and staples + Dermabond for the skin.  Once the glue was fully dried, an Aquacell Ag dressing was applied.  The patient was transported to the recovery room in stable condition.  Sponge, needle, and instrument counts were correct at the end of the case x2.  The patient  tolerated the procedure well and there were no known complications.  Please note that a surgical assistant was a medical necessity for this procedure to perform it in a safe and expeditious manner. Assistant was necessary to provide appropriate retraction of vital neurovascular structures, to prevent femoral fracture, and to allow for anatomic placement of the prosthesis.

## 2022-04-17 NOTE — Progress Notes (Signed)
PROGRESS NOTE    Terri Ortega  VOJ:[id number] DOB: 1941-03-13 DOA: 04/15/2022 PCP: Lawerance Cruel, MD   Brief Narrative:  Terri Ortega is a 81 y.o. female with medical history significant of pmh HTN, hypothyroidism, mild cognitive impairment osteoporosis who presents after having a fall.  Found to have a subcapital fracture of the left femoral neck. Orthopedics consulted and hospitalist called for admission.  Assessment & Plan:   Principal Problem:   Subcapital fracture of femur, left, closed, initial encounter [facility]) Active Problems:   Fall at home, initial encounter   Hypertensive urgency   Leukocytosis   CKD (chronic kidney disease) stage 3, GFR 30-59 ml/min (HCC)   Osteoporosis   Hypothyroidism   Mild cognitive impairment  Subcapital fracture of the left femoral neck secondary to fall -Acute s/p mechanical fall -Orthopedics following - plan for sx later today pending OR schedule -Morphine/hydrocodone as needed for pain   Hypertensive urgency -Secondary to above -Complicated by noncompliance with morning BP meds. -Hydralazine IV as needed   Leukocytosis, reactive secondary to above -Follow repeat labs   Chronic kidney disease stage IIIa -at baseline - continue IVF perioperatively -follow labs   Osteoporosis Patient had a bone density study with she was diagnosed with osteoporosis based on T score of -2.8 of the left femoral neck back in 05/2020.  Patient is on ibandronate 150 mg monthly, calcium, and vitamin D. -Continue regimen in the outpatient setting   Hypothyroidism -Continue levothyroxine   Mild cognitive impairment Patient is followed by Dr. Jaynee Eagles of neurology in the outpatient setting. -Continue donepezil   DVT prophylaxis: lovenox Code Status: Full Family Communication: None present  Status is: inpt  Dispo: The patient is from: home              Anticipated d/c is to: TBD              Anticipated d/c date is: 48-72h pending  above              Patient currently note medically stable for discharge  Consultants:  ortho  Procedures:  Tentatively planned ORIF 04/16/22  Antimicrobials:  perioperatively   Subjective: No acute issues/events overnight  Objective: Vitals:   04/16/22 2153 04/16/22 2354 04/17/22 0547 04/17/22 0626  BP: (!) 157/70 (!) 169/77 (!) 185/58 (!) 172/58  Pulse: 62 84 71   Resp: '16 18 17   '$ Temp: 98.6 F (37 C) 98.3 F (36.8 C) 99 F (37.2 C)   TempSrc: Oral Oral Oral   SpO2: 93% 94% 93%   Weight:      Height:        Intake/Output Summary (Last 24 hours) at 04/17/2022 0757 Last data filed at 04/16/2022 1838 Gross per 24 hour  Intake 120 ml  Output 500 ml  Net -380 ml    Filed Weights   04/15/22 1446  Weight: 79.4 kg    Examination:  General:  Pleasantly resting in bed, No acute distress. HEENT:  Normocephalic atraumatic.  Sclerae nonicteric, noninjected.  Extraocular movements intact bilaterally. Neck:  Without mass or deformity.  Trachea is midline. Lungs:  Clear to auscultate bilaterally without rhonchi, wheeze, or rales. Heart:  Regular rate and rhythm.  Without murmurs, rubs, or gallops. Abdomen:  Soft, nontender, nondistended.  Without guarding or rebound. Extremities: Without cyanosis, clubbing, edema, or obvious deformity. Vascular:  Dorsalis pedis and posterior tibial pulses palpable bilaterally. Skin:  Warm and dry, no erythema, no ulcerations.   Data Reviewed: I have personally  reviewed following labs and imaging studies  CBC: Recent Labs  Lab 04/15/22 1614 04/16/22 0022 04/17/22 0554  WBC 10.9* 16.6* 15.7*  NEUTROABS 8.8*  --   --   HGB 12.7 13.7 13.3  HCT 38.2 40.4 38.5  MCV 95.5 94.0 91.2  PLT 196 201 628    Basic Metabolic Panel: Recent Labs  Lab 04/15/22 1712 04/16/22 0022 04/17/22 0554  NA 141 141 139  K 4.3 3.9 3.8  CL 110 111 107  CO2 '23 24 24  '$ GLUCOSE 113* 163* 121*  BUN '20 16 16  '$ CREATININE 1.05* 1.04* 0.80  CALCIUM 8.5*  8.6* 8.9    GFR: Estimated Creatinine Clearance: 56.2 mL/min (by C-G formula based on SCr of 0.8 mg/dL). Liver Function Tests: No results for input(s): "AST", "ALT", "ALKPHOS", "BILITOT", "PROT", "ALBUMIN" in the last 168 hours. No results for input(s): "LIPASE", "AMYLASE" in the last 168 hours. No results for input(s): "AMMONIA" in the last 168 hours. Coagulation Profile: Recent Labs  Lab 04/15/22 1614  INR 1.0    Cardiac Enzymes: Recent Labs  Lab 04/15/22 1716  CKTOTAL 51    BNP (last 3 results) No results for input(s): "PROBNP" in the last 8760 hours. HbA1C: No results for input(s): "HGBA1C" in the last 72 hours. CBG: No results for input(s): "GLUCAP" in the last 168 hours. Lipid Profile: No results for input(s): "CHOL", "HDL", "LDLCALC", "TRIG", "CHOLHDL", "LDLDIRECT" in the last 72 hours. Thyroid Function Tests: No results for input(s): "TSH", "T4TOTAL", "FREET4", "T3FREE", "THYROIDAB" in the last 72 hours. Anemia Panel: No results for input(s): "VITAMINB12", "FOLATE", "FERRITIN", "TIBC", "IRON", "RETICCTPCT" in the last 72 hours. Sepsis Labs: No results for input(s): "PROCALCITON", "LATICACIDVEN" in the last 168 hours.  No results found for this or any previous visit (from the past 240 hour(s)).   Radiology Studies: DG Knee Left Port  Result Date: 04/16/2022 CLINICAL DATA:  Fracture left femoral neck. EXAM: PORTABLE LEFT KNEE - 1-2 VIEW COMPARISON:  None Available. FINDINGS: Tiny joint effusion. Mild medial compartment joint space narrowing. No acute fracture is seen. No dislocation. IMPRESSION: Mild medial compartment osteoarthritis. Electronically Signed   By: Yvonne Kendall M.D.   On: 04/16/2022 14:42   DG Chest 1 View  Result Date: 04/15/2022 CLINICAL DATA:  [id number] EXAM: CHEST  1 VIEW COMPARISON:  10/31/2014 FINDINGS: Cardiac enlargement without heart failure. Lungs are clear without infiltrate or effusion. No acute skeletal abnormality. IMPRESSION: Prominent  heart size without acute abnormality. Electronically Signed   By: Franchot Gallo M.D.   On: 04/15/2022 16:12   DG Hip Unilat With Pelvis 2-3 Views Left  Result Date: 04/15/2022 CLINICAL DATA:  Fall EXAM: DG HIP (WITH OR WITHOUT PELVIS) 2-3V LEFT COMPARISON:  None Available. FINDINGS: Subcapital femoral neck fracture left with impaction and mild displacement. Left hip joint normal. No other fracture in the pelvis. Lumbar scoliosis and degenerative change. IMPRESSION: Subcapital fracture left femoral neck. Electronically Signed   By: Franchot Gallo M.D.   On: 04/15/2022 16:11    Scheduled Meds:  donepezil  10 mg Oral QHS   enoxaparin (LOVENOX) injection  40 mg Subcutaneous Q24H   levothyroxine  88 mcg Oral QAC breakfast   Continuous Infusions:    LOS: 2 days   Time spent: 3mn  Bienvenido Proehl C Stirling Orton, DO [facility]  If 7PM-7AM, please contact night-coverage [email or web address]  04/17/2022, 7:57 AM

## 2022-04-18 ENCOUNTER — Other Ambulatory Visit: Payer: Self-pay

## 2022-04-18 DIAGNOSIS — S72012A Unspecified intracapsular fracture of left femur, initial encounter for closed fracture: Secondary | ICD-10-CM | POA: Diagnosis not present

## 2022-04-18 LAB — BASIC METABOLIC PANEL
Anion gap: 11 (ref 5–15)
BUN: 22 mg/dL (ref 8–23)
CO2: 23 mmol/L (ref 22–32)
Calcium: 8.5 mg/dL — ABNORMAL LOW (ref 8.9–10.3)
Chloride: 104 mmol/L (ref 98–111)
Creatinine, Ser: 0.96 mg/dL (ref 0.44–1.00)
GFR, Estimated: 59 mL/min — ABNORMAL LOW (ref >60)
Glucose, Bld: 142 mg/dL — ABNORMAL HIGH (ref 70–99)
Potassium: 3.9 mmol/L (ref 3.5–5.1)
Sodium: 138 mmol/L (ref 135–145)

## 2022-04-18 LAB — CBC
HCT: 36.6 % (ref 36.0–46.0)
Hemoglobin: 12.3 g/dL (ref 12.0–15.0)
MCH: 31.5 pg (ref 26.0–34.0)
MCHC: 33.6 g/dL (ref 30.0–36.0)
MCV: 93.8 fL (ref 80.0–100.0)
Platelets: 157 10*3/uL (ref 150–400)
RBC: 3.9 MIL/uL (ref 3.87–5.11)
RDW: 12.8 % (ref 11.5–15.5)
WBC: 14.7 10*3/uL — ABNORMAL HIGH (ref 4.0–10.5)
nRBC: 0 % (ref 0.0–0.2)

## 2022-04-18 NOTE — Evaluation (Signed)
Physical Therapy Evaluation Patient Details Name: Terri Ortega MRN: [id number] DOB: 1940/08/30 Today's Date: 04/18/2022  History of Present Illness  81 yo female presenting to ED on 9/13 with mechanical fall sustaining L femur neck fx. S/p L THA with anterior approach on 9/15.  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   Clinical Impression  Pt presents with condition above and deficits mentioned below, see PT Problem List. PTA, she was mod I, holding onto furniture inside the home and using a hurry-cane outside for functional mobility. She lives with her husband in a 1-level house with 4 STE. Pt demonstrates deficits in processing speed and problem-solving, but family reports pt did not hit her head and pt does have early signs of dementia at baseline. Pt demonstrates deficits in L hip ROM, L leg strength, static and dynamic balance, activity tolerance, and cognition. She is at high risk for subsequent falls. Pt is requiring modAx2 for bed mobility and minAx2 for transfers to stand but then modAx2 to prevent posterior LOB due to posterior lean. Pt requires extensive cuing for motor planning and modAx2 to ambulate up to ~4 ft with a RW at this time. As pt has had a drastic functional decline from her PLOF, is motivated to participate and improve, and has good support at home, recommending AIR. Will continue to follow acutely.     Recommendations for follow up therapy are one component of a multi-disciplinary discharge planning process, led by the attending physician.  Recommendations may be updated based on patient status, additional functional criteria and insurance authorization.  Follow Up Recommendations Acute inpatient rehab (3hours/day)      Assistance Recommended at Discharge Frequent or constant Supervision/Assistance  Patient can return home with the following  A lot of help with walking and/or  transfers;A lot of help with bathing/dressing/bathroom;Assistance with cooking/housework;Assist for transportation;Help with stairs or ramp for entrance    Equipment Recommendations BSC/3in1;Wheelchair (measurements PT);Wheelchair cushion (measurements PT)  Recommendations for Other Services  Rehab consult    Functional Status Assessment Patient has had a recent decline in their functional status and demonstrates the ability to make significant improvements in function in a reasonable and predictable amount of time.     Precautions / Restrictions Precautions Precautions: Fall Restrictions Weight Bearing Restrictions: Yes LLE Weight Bearing: Weight bearing as tolerated Other Position/Activity Restrictions: no hip precautions      Mobility  Bed Mobility Overal bed mobility: Needs Assistance Bed Mobility: Supine to Sit     Supine to sit: Mod assist, +2 for physical assistance, +2 for safety/equipment, HOB elevated     General bed mobility comments: Mod A for bringing LLE towards EOB and then elevate trunk, cuing to bridge with R leg on bed to scoot hips laterally.    Transfers Overall transfer level: Needs assistance Equipment used: Rolling walker (2 wheels) Transfers: Sit to/from Stand Sit to Stand: Min assist, Mod assist, +2 physical assistance, +2 safety/equipment           General transfer comment: Min A for power up and then Mod A for gaining balance due to posterior lean, cuing pt to prepare for transfers with L foot anteriorly placed and use of UEs on arm rests/sitting surface.    Ambulation/Gait Ambulation/Gait assistance: Mod assist, +2 physical assistance, +2 safety/equipment Gait Distance (Feet): 4 Feet Assistive device: Rolling walker (2 wheels) Gait Pattern/deviations: Step-to pattern, Decreased step length - right, Decreased step length - left, Decreased stance time - left,  Decreased stride length, Decreased dorsiflexion - left, Decreased weight shift to  right, Decreased weight shift to left, Leaning posteriorly Gait velocity: reduced Gait velocity interpretation: <1.31 ft/sec, indicative of household ambulator Pre-gait activities: Marching in place prior to stepping anteriorly, cuing for weight shifting and use of RW for support General Gait Details: Pt with posterior lean, needing modA initially to prevent LOB due to this deficit but progressed to minA for static standing once cued to push through RW. Max tactile and verbal cues provided for bil weight shifting, coordinating steps and RW, to lift L leg, and to place L foot. Pt tends to lift L leg but then not advance it or lift her toes for stepping, or even pulls the foot back once it is placed anteriorly. ModAx2 for cuing, stability, and advancing L leg.  Stairs            Wheelchair Mobility    Modified Rankin (Stroke Patients Only)       Balance Overall balance assessment: Needs assistance Sitting-balance support: No upper extremity supported, Feet supported Sitting balance-Leahy Scale: Fair   Postural control: Posterior lean Standing balance support: Bilateral upper extremity supported, During functional activity Standing balance-Leahy Scale: Poor Standing balance comment: reliant on RW and min-modA, posterior lean                             Pertinent Vitals/Pain Pain Assessment Pain Assessment: Faces Faces Pain Scale: Hurts little more Pain Location: LLE Pain Descriptors / Indicators: Constant, Discomfort Pain Intervention(s): Limited activity within patient's tolerance, Monitored during session, Repositioned    Home Living Family/patient expects to be discharged to:: Private residence Living Arrangements: Spouse/significant other Available Help at Discharge: Family;Available 24 hours/day Type of Home: House Home Access: Stairs to enter Entrance Stairs-Rails: Right;Can reach Software engineer of Steps: 4   Home Layout: One  level Home Equipment: Shower seat;Grab bars - tub/shower;Hand held shower head;Grab bars - toilet;Rolling Walker (2 wheels);Cane - quad      Prior Function Prior Level of Function : Independent/Modified Independent;Driving             Mobility Comments: Furniture walking in home. Using hurry-cane when out and with community mobility ADLs Comments: ADLs, IADLs, driving     Hand Dominance        Extremity/Trunk Assessment   Upper Extremity Assessment Upper Extremity Assessment: Defer to OT evaluation    Lower Extremity Assessment Lower Extremity Assessment: LLE deficits/detail LLE Deficits / Details: Holds L hip in internal rotation and displays hip stiffness with all ROM; limited in strength by pain also; slight numbness around hip/incision site LLE Sensation: decreased light touch (at incision site) LLE Coordination: decreased gross motor    Cervical / Trunk Assessment Cervical / Trunk Assessment: Kyphotic  Communication   Communication: No difficulties  Cognition Arousal/Alertness: Awake/alert Behavior During Therapy: WFL for tasks assessed/performed, Flat affect Overall Cognitive Status: Impaired/Different from baseline Area of Impairment: Following commands, Awareness, Problem solving, Safety/judgement                       Following Commands: Follows one step commands inconsistently, Follows one step commands with increased time Safety/Judgement: Decreased awareness of safety, Decreased awareness of deficits Awareness: Intellectual, Emergent Problem Solving: Slow processing, Difficulty sequencing, Requires verbal cues General Comments: Pt presenting with slower processing needing significant time to follow commands, motor plan, and answer questions. Requiring cues throughout  General Comments General comments (skin integrity, edema, etc.): Daughter and husband arriving at end of session and very supportive of post-acute rehab; family also  reporting pt has early signs of dementia    Exercises     Assessment/Plan    PT Assessment Patient needs continued PT services  PT Problem List Decreased strength;Decreased range of motion;Decreased activity tolerance;Decreased balance;Decreased mobility;Decreased coordination;Decreased cognition;Decreased knowledge of use of DME;Pain       PT Treatment Interventions DME instruction;Gait training;Stair training;Functional mobility training;Therapeutic activities;Therapeutic exercise;Balance training;Neuromuscular re-education;Cognitive remediation;Patient/family education    PT Goals (Current goals can be found in the Care Plan section)  Acute Rehab PT Goals Patient Stated Goal: to go home PT Goal Formulation: With patient/family Time For Goal Achievement: 05/02/22 Potential to Achieve Goals: Good    Frequency Min 5X/week     Co-evaluation PT/OT/SLP Co-Evaluation/Treatment: Yes Reason for Co-Treatment: For patient/therapist safety;To address functional/ADL transfers PT goals addressed during session: Mobility/safety with mobility;Balance;Proper use of DME OT goals addressed during session: ADL's and self-care       AM-PAC PT "6 Clicks" Mobility  Outcome Measure Help needed turning from your back to your side while in a flat bed without using bedrails?: A Lot Help needed moving from lying on your back to sitting on the side of a flat bed without using bedrails?: Total Help needed moving to and from a bed to a chair (including a wheelchair)?: Total Help needed standing up from a chair using your arms (e.g., wheelchair or bedside chair)?: Total Help needed to walk in hospital room?: Total Help needed climbing 3-5 steps with a railing? : Total 6 Click Score: 7    End of Session Equipment Utilized During Treatment: Gait belt Activity Tolerance: Patient tolerated treatment well;Patient limited by pain Patient left: in chair;with call bell/phone within reach;with chair alarm  set Nurse Communication: Mobility status PT Visit Diagnosis: Unsteadiness on feet (R26.81);Other abnormalities of gait and mobility (R26.89);Muscle weakness (generalized) (M62.81);History of falling (Z91.81);Difficulty in walking, not elsewhere classified (R26.2);Pain Pain - Right/Left: Left Pain - part of body: Hip    Time: 0830-0902 PT Time Calculation (min) (ACUTE ONLY): 32 min   Charges:   PT Evaluation $PT Eval Moderate Complexity: 1 Mod          Moishe Spice, PT, DPT Acute Rehabilitation Services  Office: [phone]   Orvan Falconer 04/18/2022, 9:57 AM

## 2022-04-18 NOTE — Progress Notes (Signed)
Inpatient Rehab Admissions Coordinator:   Per therapy recommendations, patient was screened for CIR candidacy by Clemens Catholic, MS, CCC-SLP. At this time, Pt. does not appear to demonstrate medical necessity to justify in hospital rehabilitation/CIR. Payor will not approve AIR for this diagnosis. I will not pursue a rehab consult for this Pt.   Recommend other rehab venues to be pursued.  Please contact me with any questions.  Clemens Catholic, [place], [place] Admissions Coordinator  [phone] ([place]) [phone] (office)

## 2022-04-18 NOTE — Progress Notes (Signed)
PROGRESS NOTE    Terri Ortega  WJX:[id number] DOB: [birth year] DOA: 04/15/2022 PCP: Lawerance Cruel, MD   Brief Narrative:  Terri Ortega is a 81 y.o. female with medical history significant of pmh HTN, hypothyroidism, mild cognitive impairment osteoporosis who presents after having a fall.  Found to have a subcapital fracture of the left femoral neck. Orthopedics consulted and hospitalist called for admission.  Assessment & Plan:   Principal Problem:   Subcapital fracture of femur, left, closed, initial encounter [facility]) Active Problems:   Fall at home, initial encounter   Hypertensive urgency   Leukocytosis   CKD (chronic kidney disease) stage 3, GFR 30-59 ml/min (HCC)   Osteoporosis   Hypothyroidism   Mild cognitive impairment  Subcapital fracture of the left femoral neck secondary to fall -Acute s/p mechanical fall -Orthopedics following - L hip arthroplasty, anterior approach 04/17/22, tolerated well -Morphine/hydrocodone/ultram as needed for pain -PT/OT following - possible need for placement vs HHPT   Hypertensive urgency -Secondary to above -Complicated by noncompliance with morning BP meds. -Hydralazine IV as needed   Leukocytosis, reactive secondary to above -Follow repeat labs   Chronic kidney disease stage IIIa -at baseline - continue IVF perioperatively -follow labs   Osteoporosis Patient had a bone density study with she was diagnosed with osteoporosis based on T score of -2.8 of the left femoral neck back in 05/2020.  Patient is on ibandronate 150 mg monthly, calcium, and vitamin D. -Continue regimen in the outpatient setting   Hypothyroidism -Continue levothyroxine   Mild cognitive impairment Patient is followed by Dr. Jaynee Eagles of neurology in the outpatient setting. -Continue donepezil   DVT prophylaxis: lovenox Code Status: Full Family Communication: None present  Status is: inpt  Dispo: The patient is from: home               Anticipated d/c is to: TBD              Anticipated d/c date is: 24-48h pending above              Patient currently note medically stable for discharge  Consultants:  ortho  Procedures:  L femur ORIF 04/16/22  Antimicrobials:  perioperatively   Subjective: No acute issues/events overnight  Objective: Vitals:   04/17/22 2015 04/17/22 2033 04/18/22 0029 04/18/22 0431  BP: (!) 169/66 (!) 181/59 (!) 138/54 133/61  Pulse: (!) 56 (!) 57 68 (!) 57  Resp: '15 18 16 15  '$ Temp: 98.3 F (36.8 C) 97.9 F (36.6 C) 98.7 F (37.1 C) 98.3 F (36.8 C)  TempSrc:  Oral Oral Oral  SpO2: 91% 96% 90% 95%  Weight:      Height:        Intake/Output Summary (Last 24 hours) at 04/18/2022 0813 Last data filed at 04/18/2022 0600 Gross per 24 hour  Intake 600 ml  Output 475 ml  Net 125 ml    Filed Weights   04/15/22 1446  Weight: 79.4 kg    Examination:  General:  Pleasantly resting in bed, No acute distress. HEENT:  Normocephalic atraumatic.  Sclerae nonicteric, noninjected.  Extraocular movements intact bilaterally. Neck:  Without mass or deformity.  Trachea is midline. Lungs:  Clear to auscultate bilaterally without rhonchi, wheeze, or rales. Heart:  Regular rate and rhythm.  Without murmurs, rubs, or gallops. Abdomen:  Soft, nontender, nondistended.  Without guarding or rebound. Extremities: Without cyanosis, clubbing, edema, or obvious deformity. Vascular:  Dorsalis pedis and posterior tibial pulses palpable bilaterally. Skin:  Warm and dry, no erythema, no ulcerations.   Data Reviewed: I have personally reviewed following labs and imaging studies  CBC: Recent Labs  Lab 04/15/22 1614 04/16/22 0022 04/17/22 0554 04/18/22 0049  WBC 10.9* 16.6* 15.7* 14.7*  NEUTROABS 8.8*  --   --   --   HGB 12.7 13.7 13.3 12.3  HCT 38.2 40.4 38.5 36.6  MCV 95.5 94.0 91.2 93.8  PLT 196 201 168 619    Basic Metabolic Panel: Recent Labs  Lab 04/15/22 1712 04/16/22 0022 04/17/22 0554  04/18/22 0049  NA 141 141 139 138  K 4.3 3.9 3.8 3.9  CL 110 111 107 104  CO2 '23 24 24 23  '$ GLUCOSE 113* 163* 121* 142*  BUN '20 16 16 22  '$ CREATININE 1.05* 1.04* 0.80 0.96  CALCIUM 8.5* 8.6* 8.9 8.5*    GFR: Estimated Creatinine Clearance: 46.9 mL/min (by C-G formula based on SCr of 0.96 mg/dL). Liver Function Tests: No results for input(s): "AST", "ALT", "ALKPHOS", "BILITOT", "PROT", "ALBUMIN" in the last 168 hours. No results for input(s): "LIPASE", "AMYLASE" in the last 168 hours. No results for input(s): "AMMONIA" in the last 168 hours. Coagulation Profile: Recent Labs  Lab 04/15/22 1614  INR 1.0    Cardiac Enzymes: Recent Labs  Lab 04/15/22 1716  CKTOTAL 51    BNP (last 3 results) No results for input(s): "PROBNP" in the last 8760 hours. HbA1C: No results for input(s): "HGBA1C" in the last 72 hours. CBG: No results for input(s): "GLUCAP" in the last 168 hours. Lipid Profile: No results for input(s): "CHOL", "HDL", "LDLCALC", "TRIG", "CHOLHDL", "LDLDIRECT" in the last 72 hours. Thyroid Function Tests: No results for input(s): "TSH", "T4TOTAL", "FREET4", "T3FREE", "THYROIDAB" in the last 72 hours. Anemia Panel: No results for input(s): "VITAMINB12", "FOLATE", "FERRITIN", "TIBC", "IRON", "RETICCTPCT" in the last 72 hours. Sepsis Labs: No results for input(s): "PROCALCITON", "LATICACIDVEN" in the last 168 hours.  Recent Results (from the past 240 hour(s))  Surgical pcr screen     Status: None   Collection Time: 04/17/22  3:56 PM   Specimen: Nasal Mucosa; Nasal Swab  Result Value Ref Range Status   MRSA, PCR NEGATIVE NEGATIVE Final   Staphylococcus aureus NEGATIVE NEGATIVE Final    Comment: (NOTE) The Xpert SA Assay (FDA approved for NASAL specimens in patients 50 years of age and older), is one component of a comprehensive surveillance program. It is not intended to diagnose infection nor to guide or monitor treatment. Performed at [facility],  [place] [address] [place], [place] [address]      Radiology Studies: DG Pelvis Portable  Result Date: 04/17/2022 CLINICAL DATA:  Status post left hip arthroplasty. EXAM: PORTABLE PELVIS 1-2 VIEWS COMPARISON:  Earlier today FINDINGS: Postop change from left hip arthroplasty identified. The left hip arthroplasty device appears located. No signs of periprosthetic fracture or dislocation. Note: The distal end of the femoral component is excluded from these field of view. IMPRESSION: Status post left hip arthroplasty Electronically Signed   By: Kerby Moors M.D.   On: 04/17/2022 20:05   DG HIP UNILAT WITH PELVIS 1V LEFT  Result Date: 04/17/2022 CLINICAL DATA:  Left femoral neck fracture EXAM: DG HIP (WITH OR WITHOUT PELVIS) 1V*L* COMPARISON:  04/15/2022 FLUOROSCOPY TIME:  Radiation Exposure Index (as provided by the fluoroscopic device): 0.62 mGy If the device does not provide the exposure index: Fluoroscopy Time:  13 seconds Number of Acquired Images:  2 FINDINGS: Left hip prosthesis is noted in satisfactory position.  No acute bony abnormality is noted. IMPRESSION: Status post left hip replacement. Electronically Signed   By: Inez Catalina M.D.   On: 04/17/2022 19:07   DG C-Arm 1-60 Min-No Report  Result Date: 04/17/2022 Fluoroscopy was utilized by the requesting physician.  No radiographic interpretation.   DG C-Arm 1-60 Min-No Report  Result Date: 04/17/2022 Fluoroscopy was utilized by the requesting physician.  No radiographic interpretation.   DG Knee Left Port  Result Date: 04/16/2022 CLINICAL DATA:  Fracture left femoral neck. EXAM: PORTABLE LEFT KNEE - 1-2 VIEW COMPARISON:  None Available. FINDINGS: Tiny joint effusion. Mild medial compartment joint space narrowing. No acute fracture is seen. No dislocation. IMPRESSION: Mild medial compartment osteoarthritis. Electronically Signed   By: Yvonne Kendall M.D.   On: 04/16/2022 14:42    Scheduled Meds:  aspirin  81 mg Oral BID   docusate sodium   100 mg Oral BID   donepezil  10 mg Oral QHS   levothyroxine  88 mcg Oral QAC breakfast   senna  1 tablet Oral BID   traMADol  50 mg Oral Q6H   Continuous Infusions:  sodium chloride 150 mL/hr at 04/18/22 0609   methocarbamol (ROBAXIN) IV       LOS: 3 days   Time spent: 42mn  Xiamara Hulet C Demika Langenderfer, DO [facility]  If 7PM-7AM, please contact night-coverage [email or web address]  04/18/2022, 8:13 AM

## 2022-04-18 NOTE — Evaluation (Signed)
Occupational Therapy Evaluation Patient Details Name: Terri Ortega MRN: [id number] DOB: [birth year] Today's Date: 04/18/2022   History of Present Illness 81 yo female presenting to ED on 9/13 with mechanical fall sustaining L femur neck fx. S/p L THA with anterior approach on 9/15.  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   Clinical Impression   PTA, pt was living with her husband and was independent with ADLs and light IADLs; pt reporting she used a cane in community. Pt currently requiring  Mod-Max A for LB ADLs and Mod A +2 for functional mobility with RW. Pt presenting with decreased processing, motor planning, and balance impacting her functional performance. Pt would benefit from further acute OT to facilitate safe dc. Recommend dc to AIR for further OT to optimize safety, independence with ADLs, and return to PLOF.      Recommendations for follow up therapy are one component of a multi-disciplinary discharge planning process, led by the attending physician.  Recommendations may be updated based on patient status, additional functional criteria and insurance authorization.   Follow Up Recommendations  Acute inpatient rehab (3hours/day)    Assistance Recommended at Discharge Frequent or constant Supervision/Assistance  Patient can return home with the following A lot of help with walking and/or transfers;A lot of help with bathing/dressing/bathroom    Functional Status Assessment  Patient has had a recent decline in their functional status and demonstrates the ability to make significant improvements in function in a reasonable and predictable amount of time.  Equipment Recommendations  BSC/3in1;Wheelchair (measurements OT);Wheelchair cushion (measurements OT)    Recommendations for Other Services       Precautions / Restrictions Precautions Precautions: Fall      Mobility Bed  Mobility Overal bed mobility: Needs Assistance Bed Mobility: Supine to Sit     Supine to sit: Mod assist, +2 for physical assistance, +2 for safety/equipment, HOB elevated     General bed mobility comments: Mod A for bring LLE towards EOB and then elevate trunk    Transfers Overall transfer level: Needs assistance Equipment used: Rolling walker (2 wheels) Transfers: Sit to/from Stand Sit to Stand: Min assist, Mod assist, +2 physical assistance, +2 safety/equipment           General transfer comment: Min A for power up and then Mod A for gaining balance due to power lean      Balance Overall balance assessment: Needs assistance Sitting-balance support: No upper extremity supported, Feet supported Sitting balance-Leahy Scale: Fair     Standing balance support: Bilateral upper extremity supported, During functional activity Standing balance-Leahy Scale: Poor                             ADL either performed or assessed with clinical judgement   ADL Overall ADL's : Needs assistance/impaired Eating/Feeding: Set up;Sitting   Grooming: Set up;Sitting;Supervision/safety   Upper Body Bathing: Supervision/ safety;Set up;Sitting   Lower Body Bathing: Moderate assistance;Sit to/from stand   Upper Body Dressing : Supervision/safety;Set up;Sitting   Lower Body Dressing: Maximal assistance;Sit to/from stand   Toilet Transfer: Minimal assistance;Moderate assistance;+2 for physical assistance;+2 for safety/equipment;Ambulation;Rolling walker (2 wheels) Toilet Transfer Details (indicate cue type and reason): Very short distance for forward steps; bring recline behind. Min A-Mod A for balance.         Functional mobility during ADLs: Moderate assistance;+2 for physical assistance;+2 for safety/equipment;Rolling walker (2 wheels) General ADL Comments: Poor motor  planning and awareness     Vision Baseline Vision/History: 1 Wears glasses       Perception      Praxis      Pertinent Vitals/Pain Pain Assessment Pain Assessment: Faces Faces Pain Scale: Hurts little more Pain Location: LLE Pain Descriptors / Indicators: Constant, Discomfort Pain Intervention(s): Monitored during session, Repositioned     Hand Dominance     Extremity/Trunk Assessment Upper Extremity Assessment Upper Extremity Assessment: Overall WFL for tasks assessed   Lower Extremity Assessment Lower Extremity Assessment: Defer to PT evaluation   Cervical / Trunk Assessment Cervical / Trunk Assessment: Kyphotic   Communication Communication Communication: No difficulties   Cognition Arousal/Alertness: Awake/alert Behavior During Therapy: WFL for tasks assessed/performed, Flat affect Overall Cognitive Status: Impaired/Different from baseline Area of Impairment: Following commands, Awareness, Problem solving, Safety/judgement                       Following Commands: Follows one step commands inconsistently, Follows one step commands with increased time Safety/Judgement: Decreased awareness of safety, Decreased awareness of deficits Awareness: Intellectual, Emergent Problem Solving: Slow processing, Difficulty sequencing, Requires verbal cues General Comments: Pt presenting with slower processing needing significant time to follow commands, motor plan, and answer questions. Requiring cues throughout     General Comments  Daughter and husband arriving at end of session and very supportive of post-acute rehab    Exercises     Shoulder Instructions      Home Living Family/patient expects to be discharged to:: Private residence Living Arrangements: Spouse/significant other   Type of Home: House Home Access: Stairs to enter Technical brewer of Steps: 4 Entrance Stairs-Rails: Right;Can reach both;Left Home Layout: One level     Bathroom Shower/Tub: Occupational psychologist: Calcium: Shower seat;Grab bars -  tub/shower;Hand held shower head;Grab bars - toilet;Rolling Environmental consultant (2 wheels);Cane - quad          Prior Functioning/Environment Prior Level of Function : Independent/Modified Independent;Driving             Mobility Comments: Furnature walking in home. Using cane when out and community mobility ADLs Comments: ADLs, IADLs, driving        OT Problem List: Decreased strength;Decreased range of motion;Decreased activity tolerance;Impaired balance (sitting and/or standing);Decreased knowledge of use of DME or AE;Decreased knowledge of precautions;Decreased cognition;Pain      OT Treatment/Interventions: Self-care/ADL training;Therapeutic exercise;Energy conservation;DME and/or AE instruction;Therapeutic activities;Patient/family education    OT Goals(Current goals can be found in the care plan section) Acute Rehab OT Goals Patient Stated Goal: Go home OT Goal Formulation: With patient Time For Goal Achievement: 05/02/22 Potential to Achieve Goals: Good  OT Frequency: Min 2X/week    Co-evaluation PT/OT/SLP Co-Evaluation/Treatment: Yes Reason for Co-Treatment: For patient/therapist safety;To address functional/ADL transfers   OT goals addressed during session: ADL's and self-care      AM-PAC OT "6 Clicks" Daily Activity     Outcome Measure Help from another person eating meals?: None Help from another person taking care of personal grooming?: A Little Help from another person toileting, which includes using toliet, bedpan, or urinal?: A Lot Help from another person bathing (including washing, rinsing, drying)?: A Lot Help from another person to put on and taking off regular upper body clothing?: A Little Help from another person to put on and taking off regular lower body clothing?: A Lot 6 Click Score: 16   End of Session Equipment Utilized During Treatment:  Gait belt;Rolling walker (2 wheels) Nurse Communication: Mobility status  Activity Tolerance: Patient tolerated  treatment well Patient left: in chair;with call bell/phone within reach;with chair alarm set;with family/visitor present  OT Visit Diagnosis: Unsteadiness on feet (R26.81);Other abnormalities of gait and mobility (R26.89);Muscle weakness (generalized) (M62.81);Pain                Time: 8421-0312 OT Time Calculation (min): 32 min Charges:  OT General Charges $OT Visit: 1 Visit OT Evaluation $OT Eval Moderate Complexity: 1 Mod  Brynnley Dayrit MSOT, OTR/L Acute Rehab Office: [place] 04/18/2022, 9:46 AM

## 2022-04-18 NOTE — Progress Notes (Signed)
   Subjective: 1 Day Post-Op Procedure(s) (LRB): TOTAL HIP ARTHROPLASTY ANTERIOR APPROACH (Left)  C/o mild to moderate soreness in the left hip today Denies any new symptoms or issues Currently working with therapy Patient reports pain as moderate.  Objective:   VITALS:   Vitals:   04/18/22 1208 04/18/22 1511  BP: (!) 140/59 (!) 135/57  Pulse: 71 78  Resp: 15 17  Temp: 98.2 F (36.8 C) 98.4 F (36.9 C)  SpO2: 94% (!) 88%    Left hip incision healing well Nv intact distally No rashes or edema distally  LABS Recent Labs    04/16/22 0022 04/17/22 0554 04/18/22 0049  HGB 13.7 13.3 12.3  HCT 40.4 38.5 36.6  WBC 16.6* 15.7* 14.7*  PLT 201 168 157    Recent Labs    04/16/22 0022 04/17/22 0554 04/18/22 0049  NA 141 139 138  K 3.9 3.8 3.9  BUN '16 16 22  '$ CREATININE 1.04* 0.80 0.96  GLUCOSE 163* 121* 142*     Assessment/Plan: 1 Day Post-Op Procedure(s) (LRB): TOTAL HIP ARTHROPLASTY ANTERIOR APPROACH (Left) Continue PT/OT Pain management D/c planning   Terri Ortega, MPAS [facility] is now [facility]  [facility] [address] [place], [place], [place] [address] Phone: [phone] [email or web address]  [employer]

## 2022-04-19 DIAGNOSIS — S72012A Unspecified intracapsular fracture of left femur, initial encounter for closed fracture: Secondary | ICD-10-CM | POA: Diagnosis not present

## 2022-04-19 LAB — CBC
HCT: 34.5 % — ABNORMAL LOW (ref 36.0–46.0)
Hemoglobin: 11.4 g/dL — ABNORMAL LOW (ref 12.0–15.0)
MCH: 31.1 pg (ref 26.0–34.0)
MCHC: 33 g/dL (ref 30.0–36.0)
MCV: 94 fL (ref 80.0–100.0)
Platelets: 142 10*3/uL — ABNORMAL LOW (ref 150–400)
RBC: 3.67 MIL/uL — ABNORMAL LOW (ref 3.87–5.11)
RDW: 12.7 % (ref 11.5–15.5)
WBC: 10.4 10*3/uL (ref 4.0–10.5)
nRBC: 0 % (ref 0.0–0.2)

## 2022-04-19 NOTE — Progress Notes (Signed)
PROGRESS NOTE    Terri Ortega  KPT:[id number] DOB: [birth year] DOA: 04/15/2022 PCP: Lawerance Cruel, MD   Brief Narrative:  Terri Ortega is a 81 y.o. female with medical history significant of pmh HTN, hypothyroidism, mild cognitive impairment osteoporosis who presents after having a fall.  Found to have a subcapital fracture of the left femoral neck. Orthopedics consulted and hospitalist called for admission.  Patient remains medically stable for discharge, currently awaiting safe disposition.  Being evaluated by inpatient rehab at this time.  Otherwise may benefit from discharge to SNF if CIR is not appropriate or amenable.  Assessment & Plan:   Principal Problem:   Subcapital fracture of femur, left, closed, initial encounter [facility]) Active Problems:   Fall at home, initial encounter   Hypertensive urgency   Leukocytosis   CKD (chronic kidney disease) stage 3, GFR 30-59 ml/min (HCC)   Osteoporosis   Hypothyroidism   Mild cognitive impairment  Subcapital fracture of the left femoral neck secondary to fall -Acute s/p mechanical fall -Orthopedics following - L hip arthroplasty, anterior approach 04/17/22, tolerated well -Morphine/hydrocodone/ultram as needed for pain -PT/OT following -pending improvement plan for CIR versus SNF   Hypertensive urgency, resolved -Secondary to above and pain -Complicated by noncompliance with morning BP meds.   Leukocytosis, reactive secondary to above -Resolved   Chronic kidney disease stage IIIa -At baseline, IV fluids discontinued in the setting of increased p.o. intake   Osteoporosis Patient had a bone density study with she was diagnosed with osteoporosis based on T score of -2.8 of the left femoral neck back in 05/2020.  Patient is on ibandronate 150 mg monthly, calcium, and vitamin D. -Continue regimen in the outpatient setting   Hypothyroidism -Continue levothyroxine   Mild cognitive impairment Patient is followed  by Dr. Jaynee Eagles of neurology in the outpatient setting. -Continue donepezil   DVT prophylaxis: lovenox Code Status: Full Family Communication: None present  Status is: inpt  Dispo: The patient is from: home              Anticipated d/c is to: Inpatient rehab versus SNF              Anticipated d/c date is: 24-48h pending above              Patient currently note medically stable for discharge  Consultants:  ortho  Procedures:  L femur ORIF 04/16/22  Antimicrobials:  perioperatively   Subjective: No acute issues/events overnight, pain well controlled, appetite somewhat poor this morning but otherwise denies nausea vomiting diarrhea constipation  Objective: Vitals:   04/18/22 1511 04/18/22 2046 04/19/22 0429 04/19/22 0759  BP: (!) 135/57 (!) 172/72 (!) 175/64 (!) 178/64  Pulse: 78 81 71 66  Resp: '17 17 16 15  '$ Temp: 98.4 F (36.9 C) 98.4 F (36.9 C) 99.2 F (37.3 C) 98.7 F (37.1 C)  TempSrc: Oral Oral Oral Oral  SpO2: (!) 88% 92% 91% 95%  Weight:      Height:        Intake/Output Summary (Last 24 hours) at 04/19/2022 0839 Last data filed at 04/18/2022 2053 Gross per 24 hour  Intake 60 ml  Output --  Net 60 ml    Filed Weights   04/15/22 1446  Weight: 79.4 kg    Examination:  General:  Pleasantly resting in bed, No acute distress. HEENT:  Normocephalic atraumatic.  Sclerae nonicteric, noninjected.  Extraocular movements intact bilaterally. Neck:  Without mass or deformity.  Trachea is midline. Lungs:  Clear to auscultate bilaterally without rhonchi, wheeze, or rales. Heart:  Regular rate and rhythm.  Without murmurs, rubs, or gallops. Abdomen:  Soft, nontender, nondistended.  Without guarding or rebound. Extremities: Without cyanosis, clubbing, edema, or obvious deformity. Vascular:  Dorsalis pedis and posterior tibial pulses palpable bilaterally. Skin:  Warm and dry, no erythema, no ulcerations.   Data Reviewed: I have personally reviewed following labs  and imaging studies  CBC: Recent Labs  Lab 04/15/22 1614 04/16/22 0022 04/17/22 0554 04/18/22 0049 04/19/22 0011  WBC 10.9* 16.6* 15.7* 14.7* 10.4  NEUTROABS 8.8*  --   --   --   --   HGB 12.7 13.7 13.3 12.3 11.4*  HCT 38.2 40.4 38.5 36.6 34.5*  MCV 95.5 94.0 91.2 93.8 94.0  PLT 196 201 168 157 142*    Basic Metabolic Panel: Recent Labs  Lab 04/15/22 1712 04/16/22 0022 04/17/22 0554 04/18/22 0049  NA 141 141 139 138  K 4.3 3.9 3.8 3.9  CL 110 111 107 104  CO2 '23 24 24 23  '$ GLUCOSE 113* 163* 121* 142*  BUN '20 16 16 22  '$ CREATININE 1.05* 1.04* 0.80 0.96  CALCIUM 8.5* 8.6* 8.9 8.5*    GFR: Estimated Creatinine Clearance: 46.9 mL/min (by C-G formula based on SCr of 0.96 mg/dL). Liver Function Tests: No results for input(s): "AST", "ALT", "ALKPHOS", "BILITOT", "PROT", "ALBUMIN" in the last 168 hours. No results for input(s): "LIPASE", "AMYLASE" in the last 168 hours. No results for input(s): "AMMONIA" in the last 168 hours. Coagulation Profile: Recent Labs  Lab 04/15/22 1614  INR 1.0    Cardiac Enzymes: Recent Labs  Lab 04/15/22 1716  CKTOTAL 51    BNP (last 3 results) No results for input(s): "PROBNP" in the last 8760 hours. HbA1C: No results for input(s): "HGBA1C" in the last 72 hours. CBG: No results for input(s): "GLUCAP" in the last 168 hours. Lipid Profile: No results for input(s): "CHOL", "HDL", "LDLCALC", "TRIG", "CHOLHDL", "LDLDIRECT" in the last 72 hours. Thyroid Function Tests: No results for input(s): "TSH", "T4TOTAL", "FREET4", "T3FREE", "THYROIDAB" in the last 72 hours. Anemia Panel: No results for input(s): "VITAMINB12", "FOLATE", "FERRITIN", "TIBC", "IRON", "RETICCTPCT" in the last 72 hours. Sepsis Labs: No results for input(s): "PROCALCITON", "LATICACIDVEN" in the last 168 hours.  Recent Results (from the past 240 hour(s))  Surgical pcr screen     Status: None   Collection Time: 04/17/22  3:56 PM   Specimen: Nasal Mucosa; Nasal Swab   Result Value Ref Range Status   MRSA, PCR NEGATIVE NEGATIVE Final   Staphylococcus aureus NEGATIVE NEGATIVE Final    Comment: (NOTE) The Xpert SA Assay (FDA approved for NASAL specimens in patients 73 years of age and older), is one component of a comprehensive surveillance program. It is not intended to diagnose infection nor to guide or monitor treatment. Performed at [facility], [place] [address] [place], [place] [address]      Radiology Studies: DG Pelvis Portable  Result Date: 04/17/2022 CLINICAL DATA:  Status post left hip arthroplasty. EXAM: PORTABLE PELVIS 1-2 VIEWS COMPARISON:  Earlier today FINDINGS: Postop change from left hip arthroplasty identified. The left hip arthroplasty device appears located. No signs of periprosthetic fracture or dislocation. Note: The distal end of the femoral component is excluded from these field of view. IMPRESSION: Status post left hip arthroplasty Electronically Signed   By: Kerby Moors M.D.   On: 04/17/2022 20:05   DG HIP UNILAT WITH PELVIS 1V LEFT  Result Date: 04/17/2022  CLINICAL DATA:  Left femoral neck fracture EXAM: DG HIP (WITH OR WITHOUT PELVIS) 1V*L* COMPARISON:  04/15/2022 FLUOROSCOPY TIME:  Radiation Exposure Index (as provided by the fluoroscopic device): 0.62 mGy If the device does not provide the exposure index: Fluoroscopy Time:  13 seconds Number of Acquired Images:  2 FINDINGS: Left hip prosthesis is noted in satisfactory position. No acute bony abnormality is noted. IMPRESSION: Status post left hip replacement. Electronically Signed   By: Inez Catalina M.D.   On: 04/17/2022 19:07   DG C-Arm 1-60 Min-No Report  Result Date: 04/17/2022 Fluoroscopy was utilized by the requesting physician.  No radiographic interpretation.   DG C-Arm 1-60 Min-No Report  Result Date: 04/17/2022 Fluoroscopy was utilized by the requesting physician.  No radiographic interpretation.    Scheduled Meds:  aspirin  81 mg Oral BID   docusate  sodium  100 mg Oral BID   donepezil  10 mg Oral QHS   levothyroxine  88 mcg Oral QAC breakfast   senna  1 tablet Oral BID   traMADol  50 mg Oral Q6H   Continuous Infusions:  sodium chloride 150 mL/hr at 04/19/22 0520   methocarbamol (ROBAXIN) IV       LOS: 4 days   Time spent: 55mn  Jordanna Hendrie C Anarie Kalish, DO [facility]  If 7PM-7AM, please contact night-coverage [email or web address]  04/19/2022, 8:39 AM

## 2022-04-19 NOTE — Anesthesia Postprocedure Evaluation (Signed)
Anesthesia Post Note  Patient: Terri Ortega  Procedure(s) Performed: TOTAL HIP ARTHROPLASTY ANTERIOR APPROACH (Left: Hip)     Patient location during evaluation: PACU Anesthesia Type: Spinal Level of consciousness: oriented and awake and alert Pain management: pain level controlled Vital Signs Assessment: post-procedure vital signs reviewed and stable Respiratory status: spontaneous breathing, respiratory function stable and patient connected to nasal cannula oxygen Cardiovascular status: blood pressure returned to baseline and stable Postop Assessment: no headache, no backache and no apparent nausea or vomiting Anesthetic complications: no   No notable events documented.  Last Vitals:  Vitals:   04/19/22 0429 04/19/22 0759  BP: (!) 175/64 (!) 178/64  Pulse: 71 66  Resp: 16 15  Temp: 37.3 C 37.1 C  SpO2: 91% 95%    Last Pain:  Vitals:   04/19/22 0759  TempSrc: Oral  PainSc:                  [place]

## 2022-04-19 NOTE — Plan of Care (Signed)
  Problem: Education: Goal: Knowledge of General Education information will improve Description Including pain rating scale, medication(s)/side effects and non-pharmacologic comfort measures Outcome: Progressing   Problem: Health Behavior/Discharge Planning: Goal: Ability to manage health-related needs will improve Outcome: Progressing   

## 2022-04-19 NOTE — Progress Notes (Signed)
   Subjective: 2 Days Post-Op Procedure(s) (LRB): TOTAL HIP ARTHROPLASTY ANTERIOR APPROACH (Left)  Pt c/o moderate soreness this morning in the left hip Denies any numbness or tingling distally Therapy went fairly well yesterday Denies any new symptoms overnight Patient reports pain as moderate.  Objective:   VITALS:   Vitals:   04/19/22 0429 04/19/22 0759  BP: (!) 175/64 (!) 178/64  Pulse: 71 66  Resp: 16 15  Temp: 99.2 F (37.3 C) 98.7 F (37.1 C)  SpO2: 91% 95%    Left hip incision healing well Nv intact distally No rashes or edema  No drainage or signs of infection   LABS Recent Labs    04/17/22 0554 04/18/22 0049 04/19/22 0011  HGB 13.3 12.3 11.4*  HCT 38.5 36.6 34.5*  WBC 15.7* 14.7* 10.4  PLT 168 157 142*    Recent Labs    04/17/22 0554 04/18/22 0049  NA 139 138  K 3.8 3.9  BUN 16 22  CREATININE 0.80 0.96  GLUCOSE 121* 142*     Assessment/Plan: 2 Days Post-Op Procedure(s) (LRB): TOTAL HIP ARTHROPLASTY ANTERIOR APPROACH (Left) Continue PT/OT Pain management D/c planning Weight bearing as tolerated left lower extremity Will continue to monitor her progress   Merla Riches PA-C, [place] [facility] now [employer] [address] [place], [place], [place] [address] Phone: [phone] [email or web address] Facebook  [employer]

## 2022-04-20 ENCOUNTER — Encounter (HOSPITAL_COMMUNITY): Payer: Self-pay | Admitting: Orthopedic Surgery

## 2022-04-20 DIAGNOSIS — S72012A Unspecified intracapsular fracture of left femur, initial encounter for closed fracture: Secondary | ICD-10-CM | POA: Diagnosis not present

## 2022-04-20 NOTE — Plan of Care (Signed)
  Problem: Pain Managment: Goal: General experience of comfort will improve Outcome: Not Progressing   Problem: Safety: Goal: Ability to remain free from injury will improve Outcome: Not Progressing   Problem: Skin Integrity: Goal: Risk for impaired skin integrity will decrease Outcome: Not Progressing   

## 2022-04-20 NOTE — Progress Notes (Signed)
PROGRESS NOTE    Terri Ortega  LNL:[id number] DOB: 1941-03-08 DOA: 04/15/2022 PCP: Lawerance Cruel, MD   Brief Narrative:  Terri Ortega is a 81 y.o. female with medical history significant of pmh HTN, hypothyroidism, mild cognitive impairment osteoporosis who presents after having a fall.  Found to have a subcapital fracture of the left femoral neck. Orthopedics consulted and hospitalist called for admission.  Patient remains medically stable for discharge, currently awaiting safe disposition.  Being evaluated by inpatient rehab at this time.  Otherwise may benefit from discharge to SNF if CIR is not appropriate or amenable.  Assessment & Plan:   Principal Problem:   Subcapital fracture of femur, left, closed, initial encounter [facility]) Active Problems:   Fall at home, initial encounter   Hypertensive urgency   Leukocytosis   CKD (chronic kidney disease) stage 3, GFR 30-59 ml/min (HCC)   Osteoporosis   Hypothyroidism   Mild cognitive impairment  Subcapital fracture of the left femoral neck secondary to fall -Acute s/p mechanical fall -Orthopedics following - L hip arthroplasty, anterior approach 04/17/22, tolerated well -Morphine/hydrocodone/ultram as needed for pain -appears to be well controlled using hydrocodone only sparingly -PT/OT following -pending improvement plan for CIR versus SNF   Hypertensive urgency, resolved -Secondary to above and pain -Complicated by noncompliance with morning BP meds.   Leukocytosis, reactive secondary to above -Resolved   Chronic kidney disease stage IIIa -At baseline, IV fluids discontinued in the setting of increased p.o. intake   Osteoporosis Patient had a bone density study with she was diagnosed with osteoporosis based on T score of -2.8 of the left femoral neck back in 05/2020.  Patient is on ibandronate 150 mg monthly, calcium, and vitamin D. -Continue regimen in the outpatient setting   Hypothyroidism -Continue  levothyroxine   Mild cognitive impairment Patient is followed by Dr. Jaynee Eagles of neurology in the outpatient setting. -Continue donepezil   DVT prophylaxis: lovenox Code Status: Full Family Communication: Daughter at bedside  Status is: inpt  Dispo: The patient is from: home              Anticipated d/c is to: Inpatient rehab versus SNF pending evaluation approval              Anticipated d/c date is: 24-48h pending above              Patient currently note medically stable for discharge  Consultants:  ortho  Procedures:  L femur ORIF 04/16/22  Antimicrobials:  perioperatively   Subjective: No acute issues/events overnight, pain well controlled, appetite somewhat poor this morning but otherwise denies nausea vomiting diarrhea constipation  Objective: Vitals:   04/19/22 0759 04/19/22 1559 04/19/22 2137 04/20/22 0600  BP: (!) 178/64 (!) 141/58 (!) 162/63 (!) 154/53  Pulse: 66 66 69 62  Resp: '15 16 18 18  '$ Temp: 98.7 F (37.1 C) 99.1 F (37.3 C) 98.8 F (37.1 C) 97.8 F (36.6 C)  TempSrc: Oral Oral Oral Oral  SpO2: 95% 98% 91% 99%  Weight:      Height:        Intake/Output Summary (Last 24 hours) at 04/20/2022 0750 Last data filed at 04/20/2022 0000 Gross per 24 hour  Intake 336 ml  Output 750 ml  Net -414 ml    Filed Weights   04/15/22 1446  Weight: 79.4 kg    Examination:  General:  Pleasantly resting in bed, No acute distress. HEENT:  Normocephalic atraumatic.  Sclerae nonicteric, noninjected.  Extraocular movements  intact bilaterally. Neck:  Without mass or deformity.  Trachea is midline. Lungs:  Clear to auscultate bilaterally without rhonchi, wheeze, or rales. Heart:  Regular rate and rhythm.  Without murmurs, rubs, or gallops. Abdomen:  Soft, nontender, nondistended.  Without guarding or rebound. Extremities: Without cyanosis, clubbing, edema, or obvious deformity. Vascular:  Dorsalis pedis and posterior tibial pulses palpable bilaterally. Skin:  Warm  and dry, no erythema, no ulcerations.   Data Reviewed: I have personally reviewed following labs and imaging studies  CBC: Recent Labs  Lab 04/15/22 1614 04/16/22 0022 04/17/22 0554 04/18/22 0049 04/19/22 0011  WBC 10.9* 16.6* 15.7* 14.7* 10.4  NEUTROABS 8.8*  --   --   --   --   HGB 12.7 13.7 13.3 12.3 11.4*  HCT 38.2 40.4 38.5 36.6 34.5*  MCV 95.5 94.0 91.2 93.8 94.0  PLT 196 201 168 157 142*    Basic Metabolic Panel: Recent Labs  Lab 04/15/22 1712 04/16/22 0022 04/17/22 0554 04/18/22 0049  NA 141 141 139 138  K 4.3 3.9 3.8 3.9  CL 110 111 107 104  CO2 '23 24 24 23  '$ GLUCOSE 113* 163* 121* 142*  BUN '20 16 16 22  '$ CREATININE 1.05* 1.04* 0.80 0.96  CALCIUM 8.5* 8.6* 8.9 8.5*    GFR: Estimated Creatinine Clearance: 46.9 mL/min (by C-G formula based on SCr of 0.96 mg/dL). Liver Function Tests: No results for input(s): "AST", "ALT", "ALKPHOS", "BILITOT", "PROT", "ALBUMIN" in the last 168 hours. No results for input(s): "LIPASE", "AMYLASE" in the last 168 hours. No results for input(s): "AMMONIA" in the last 168 hours. Coagulation Profile: Recent Labs  Lab 04/15/22 1614  INR 1.0    Cardiac Enzymes: Recent Labs  Lab 04/15/22 1716  CKTOTAL 51    BNP (last 3 results) No results for input(s): "PROBNP" in the last 8760 hours. HbA1C: No results for input(s): "HGBA1C" in the last 72 hours. CBG: No results for input(s): "GLUCAP" in the last 168 hours. Lipid Profile: No results for input(s): "CHOL", "HDL", "LDLCALC", "TRIG", "CHOLHDL", "LDLDIRECT" in the last 72 hours. Thyroid Function Tests: No results for input(s): "TSH", "T4TOTAL", "FREET4", "T3FREE", "THYROIDAB" in the last 72 hours. Anemia Panel: No results for input(s): "VITAMINB12", "FOLATE", "FERRITIN", "TIBC", "IRON", "RETICCTPCT" in the last 72 hours. Sepsis Labs: No results for input(s): "PROCALCITON", "LATICACIDVEN" in the last 168 hours.  Recent Results (from the past 240 hour(s))  Surgical pcr  screen     Status: None   Collection Time: 04/17/22  3:56 PM   Specimen: Nasal Mucosa; Nasal Swab  Result Value Ref Range Status   MRSA, PCR NEGATIVE NEGATIVE Final   Staphylococcus aureus NEGATIVE NEGATIVE Final    Comment: (NOTE) The Xpert SA Assay (FDA approved for NASAL specimens in patients 28 years of age and older), is one component of a comprehensive surveillance program. It is not intended to diagnose infection nor to guide or monitor treatment. Performed at [facility], [place] [address] [place], [place] [address]      Radiology Studies: No results found.  Scheduled Meds:  aspirin  81 mg Oral BID   docusate sodium  100 mg Oral BID   donepezil  10 mg Oral QHS   levothyroxine  88 mcg Oral QAC breakfast   senna  1 tablet Oral BID   traMADol  50 mg Oral Q6H   Continuous Infusions:  methocarbamol (ROBAXIN) IV       LOS: 5 days   Time spent: 24mn  WLuanna Cole  Avon Gully, DO [facility]  If 7PM-7AM, please contact night-coverage [email or web address]  04/20/2022, 7:50 AM

## 2022-04-20 NOTE — Progress Notes (Signed)
Occupational Therapy Treatment Patient Details Name: Terri Ortega MRN: [id number] DOB: 01/31/1941 Today's Date: 04/20/2022   History of present illness 81 yo female presenting to ED on 9/13 with mechanical fall sustaining L femur neck fx. S/p L THA with anterior approach on 9/15.  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   OT comments  Patient continues to make steady progress towards goals in skilled OT session. Patient's session encompassed  functional mobility progression to increase overall independence. Patient continues to demonstrate poor motor planning and cognition, requiring multi-modal cues throughout entirety of session to advance gait with each repetition. Patient remains mod of 2 to progress with transfers and ambulation. OT continues to advocate for AIR placement for patient. OT will continue to follow.   Recommendations for follow up therapy are one component of a multi-disciplinary discharge planning process, led by the attending physician.  Recommendations may be updated based on patient status, additional functional criteria and insurance authorization.    Follow Up Recommendations  Acute inpatient rehab (3hours/day)    Assistance Recommended at Discharge Frequent or constant Supervision/Assistance  Patient can return home with the following  A lot of help with walking and/or transfers;A lot of help with bathing/dressing/bathroom;Direct supervision/assist for medications management;Direct supervision/assist for financial management;Assist for transportation;Help with stairs or ramp for entrance   Equipment Recommendations  BSC/3in1;Wheelchair (measurements OT);Wheelchair cushion (measurements OT)    Recommendations for Other Services      Precautions / Restrictions Precautions Precautions: Fall Restrictions Weight Bearing Restrictions: Yes LLE Weight Bearing: Weight bearing as  tolerated Other Position/Activity Restrictions: no hip precautions       Mobility Bed Mobility Overal bed mobility: Needs Assistance Bed Mobility: Rolling, Sidelying to Sit Rolling: Min assist Sidelying to sit: +2 for physical assistance, HOB elevated, Max assist       General bed mobility comments: Step by step cues for sequencing, assist with LLE, reaching for rail and to elevate trunk, scoot bottom to EOB with increased time/cues and effort. Heavily relying on rail.    Transfers Overall transfer level: Needs assistance Equipment used: Rolling walker (2 wheels) Transfers: Sit to/from Stand Sit to Stand: Mod assist, +2 physical assistance           General transfer comment: Assist of 2 to power to standing with cues for hand placement/foot placement, stood from EOB x1, from chair x1, transferred to chair post ambulation. Posterior and left lean bias with LLE in abducted position initially.     Balance Overall balance assessment: Needs assistance Sitting-balance support: Feet supported, Single extremity supported Sitting balance-Leahy Scale: Poor Sitting balance - Comments: Moments of min guard assist with UE support otherwise needing Min-mod A due to posterior lean. Postural control: Posterior lean Standing balance support: During functional activity, Reliant on assistive device for balance, Bilateral upper extremity supported Standing balance-Leahy Scale: Poor Standing balance comment: reliant on RW and min-modA, posterior lean                           ADL either performed or assessed with clinical judgement   ADL Overall ADL's : Needs assistance/impaired     Grooming: Set up;Sitting;Supervision/safety                   Toilet Transfer: Moderate assistance;+2 for physical assistance;+2 for safety/equipment;Ambulation;Rolling walker (2 wheels) Toilet Transfer Details (indicate cue type and reason): Very short distance for forward steps; bring  recliner behind.  Mod A for balance and having to provide tactile cues at trunk to advance gait with each leg.         Functional mobility during ADLs: Moderate assistance;+2 for physical assistance;+2 for safety/equipment;Rolling walker (2 wheels) General ADL Comments: Patient continues to demonstrate poor motor planning and awareness, with need for multi-modal cues to advance gait    Extremity/Trunk Assessment              Vision       Perception     Praxis      Cognition Arousal/Alertness: Awake/alert Behavior During Therapy: Flat affect Overall Cognitive Status: Impaired/Different from baseline Area of Impairment: Following commands, Awareness, Problem solving, Safety/judgement                       Following Commands: Follows one step commands inconsistently, Follows one step commands with increased time Safety/Judgement: Decreased awareness of safety, Decreased awareness of deficits Awareness: Intellectual, Emergent Problem Solving: Slow processing, Difficulty sequencing, Requires verbal cues, Requires tactile cues, Decreased initiation General Comments: Pt presenting with slower processing needing significant time to follow commands, motor plan, and answer questions. Requiring cues throughout, increased time.        Exercises      Shoulder Instructions       General Comments Daughter present during session.    Pertinent Vitals/ Pain       Pain Assessment Pain Assessment: Faces Faces Pain Scale: Hurts even more Pain Location: LLE Pain Descriptors / Indicators: Constant, Discomfort, Grimacing, Guarding Pain Intervention(s): Limited activity within patient's tolerance, Monitored during session, Repositioned  Home Living                                          Prior Functioning/Environment              Frequency  Min 2X/week        Progress Toward Goals  OT Goals(current goals can now be found in the care plan  section)  Progress towards OT goals: Progressing toward goals  Acute Rehab OT Goals Patient Stated Goal: go home OT Goal Formulation: With patient Time For Goal Achievement: 05/02/22 Potential to Achieve Goals: [place] Discharge plan remains appropriate    Co-evaluation      Reason for Co-Treatment: Necessary to address cognition/behavior during functional activity;For patient/therapist safety PT goals addressed during session: Mobility/safety with mobility;Balance;Strengthening/ROM;Proper use of DME OT goals addressed during session: ADL's and self-care      AM-PAC OT "6 Clicks" Daily Activity     Outcome Measure   Help from another person eating meals?: None Help from another person taking care of personal grooming?: A Little Help from another person toileting, which includes using toliet, bedpan, or urinal?: A Lot Help from another person bathing (including washing, rinsing, drying)?: A Lot Help from another person to put on and taking off regular upper body clothing?: A Little Help from another person to put on and taking off regular lower body clothing?: A Lot 6 Click Score: 16    End of Session Equipment Utilized During Treatment: Gait belt;Rolling walker (2 wheels)  OT Visit Diagnosis: Unsteadiness on feet (R26.81);Other abnormalities of gait and mobility (R26.89);Muscle weakness (generalized) (M62.81);Pain   Activity Tolerance Patient tolerated treatment well   Patient Left in chair;with call bell/phone within reach;with chair alarm set;with family/visitor present   Nurse Communication Mobility  status        Time: 1000-1031 OT Time Calculation (min): 31 min  Charges: OT General Charges $OT Visit: 1 Visit OT Treatments $Self Care/Home Management : 8-22 mins  Corinne Ports E. Danzig Macgregor, OTR/L Acute Rehabilitation Services [phone]   Ascencion Dike 04/20/2022, 12:29 PM

## 2022-04-20 NOTE — Progress Notes (Signed)
Physical Therapy Treatment Patient Details Name: Terri Ortega MRN: [id number] DOB: 1940/09/18 Today's Date: 04/20/2022   History of Present Illness 81 yo female presenting to ED on 9/13 with mechanical fall sustaining L femur neck fx. S/p L THA with anterior approach on 9/15.  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    PT Comments    Patient progressing slowly towards PT goals. Session focused on standing and initiation of gait training. Pt continues to require assist of 2 for bed mobility, standing and walking. Mod-max A of 2 needed for gait with assist for RW management, sequencing, LLE advancement, weight shifting and overall balance. Instructed pt in there ex to perform throughout the day and encouraged OOB to [facility] for toileting to increase reps of mobility. Continue to recommend post acute rehab. Very supportive and encouraging daughter. Will continue to follow and progress as tolerated.    Recommendations for follow up therapy are one component of a multi-disciplinary discharge planning process, led by the attending physician.  Recommendations may be updated based on patient status, additional functional criteria and insurance authorization.  Follow Up Recommendations  Acute inpatient rehab (3hours/day)     Assistance Recommended at Discharge Frequent or constant Supervision/Assistance  Patient can return home with the following A lot of help with walking and/or transfers;A lot of help with bathing/dressing/bathroom;Assistance with cooking/housework;Assist for transportation;Help with stairs or ramp for entrance   Equipment Recommendations  BSC/3in1;Wheelchair (measurements PT);Wheelchair cushion (measurements PT)    Recommendations for Other Services       Precautions / Restrictions Precautions Precautions: Fall Restrictions Weight Bearing Restrictions: Yes LLE Weight Bearing: Weight bearing  as tolerated Other Position/Activity Restrictions: no hip precautions     Mobility  Bed Mobility Overal bed mobility: Needs Assistance Bed Mobility: Rolling, Sidelying to Sit Rolling: Min assist Sidelying to sit: +2 for physical assistance, HOB elevated, Max assist       General bed mobility comments: Step by step cues for sequencing, assist with LLE, reaching for rail and to elevate trunk, scoot bottom to EOB with increased time/cues and effort. Heavily relying on rail.    Transfers Overall transfer level: Needs assistance Equipment used: Rolling walker (2 wheels) Transfers: Sit to/from Stand Sit to Stand: Mod assist, +2 physical assistance           General transfer comment: Assist of 2 to power to standing with cues for hand placement/foot placement, stood from EOB x1, from chair x1, transferred to chair post ambulation. Posterior and left lean bias with LLE in abducted position initially.    Ambulation/Gait Ambulation/Gait assistance: Mod assist, Max assist, +2 physical assistance, +2 safety/equipment Gait Distance (Feet): 3 Feet (+ 5') Assistive device: Rolling walker (2 wheels) Gait Pattern/deviations: Step-to pattern, Decreased step length - right, Decreased step length - left, Decreased stance time - left, Decreased stride length, Decreased dorsiflexion - left, Decreased weight shift to right, Decreased weight shift to left, Leaning posteriorly, Shuffle Gait velocity: reduced Gait velocity interpretation: <1.31 ft/sec, indicative of household ambulator   General Gait Details: Slow, shuffling like gait wtih assist for RW management, advancing LLE, weight shifting and balance. 1 seated rest break. Max A for cueing and sequencing.   Stairs             Wheelchair Mobility    Modified Rankin (Stroke Patients Only)       Balance Overall balance assessment: Needs assistance Sitting-balance support: Feet supported, Single extremity supported Sitting  balance-Leahy Scale:  Poor Sitting balance - Comments: MOments of min guard assist with UE support otherwise needing Min-mod A due to posterior lean. Postural control: Posterior lean Standing balance support: During functional activity, Reliant on assistive device for balance, Bilateral upper extremity supported Standing balance-Leahy Scale: Poor Standing balance comment: reliant on RW and min-modA, posterior lean                            Cognition Arousal/Alertness: Awake/alert Behavior During Therapy: Flat affect Overall Cognitive Status: Impaired/Different from baseline Area of Impairment: Following commands, Awareness, Problem solving, Safety/judgement                       Following Commands: Follows one step commands inconsistently, Follows one step commands with increased time Safety/Judgement: Decreased awareness of safety, Decreased awareness of deficits Awareness: Intellectual, Emergent Problem Solving: Slow processing, Difficulty sequencing, Requires verbal cues, Requires tactile cues, Decreased initiation General Comments: Pt presenting with slower processing needing significant time to follow commands, motor plan, and answer questions. Requiring cues throughout, increased time.        Exercises Total Joint Exercises Ankle Circles/Pumps: AROM, Both, 10 reps, Supine Quad Sets: AROM, Both, 10 reps, Supine Gluteal Sets: AROM, Both, 5 reps, Seated Long Arc Quad: AROM, Left, Seated, 5 reps    General Comments General comments (skin integrity, edema, etc.): Daughter present during session.      Pertinent Vitals/Pain Pain Assessment Pain Assessment: Faces Faces Pain Scale: Hurts even more Pain Location: LLE Pain Descriptors / Indicators: Constant, Discomfort, Grimacing, Guarding Pain Intervention(s): Monitored during session, Repositioned, Limited activity within patient's tolerance    Home Living                          Prior Function             PT Goals (current goals can now be found in the care plan section) Progress towards PT goals: Progressing toward goals    Frequency    Min 5X/week      PT Plan Current plan remains appropriate    Co-evaluation PT/OT/SLP Co-Evaluation/Treatment: Yes Reason for Co-Treatment: For patient/therapist safety;To address functional/ADL transfers;Necessary to address cognition/behavior during functional activity PT goals addressed during session: Mobility/safety with mobility;Balance;Strengthening/ROM;Proper use of DME        AM-PAC PT "6 Clicks" Mobility   Outcome Measure  Help needed turning from your back to your side while in a flat bed without using bedrails?: A Lot Help needed moving from lying on your back to sitting on the side of a flat bed without using bedrails?: A Lot Help needed moving to and from a bed to a chair (including a wheelchair)?: Total Help needed standing up from a chair using your arms (e.g., wheelchair or bedside chair)?: Total Help needed to walk in hospital room?: Total Help needed climbing 3-5 steps with a railing? : Total 6 Click Score: 8    End of Session Equipment Utilized During Treatment: Gait belt Activity Tolerance: Patient tolerated treatment well Patient left: in chair;with call bell/phone within reach;with chair alarm set;with family/visitor present Nurse Communication: Mobility status PT Visit Diagnosis: Unsteadiness on feet (R26.81);Other abnormalities of gait and mobility (R26.89);Muscle weakness (generalized) (M62.81);History of falling (Z91.81);Difficulty in walking, not elsewhere classified (R26.2);Pain Pain - Right/Left: Left Pain - part of body: Hip     Time: 1000-1030 PT Time Calculation (min) (ACUTE ONLY): 30 min  Charges:  $  Gait Training: 8-22 mins                     Zettie Cooley, DPT Acute Rehabilitation Services Secure chat preferred Office [place] 04/20/2022, 10:54  AM

## 2022-04-21 DIAGNOSIS — S72012A Unspecified intracapsular fracture of left femur, initial encounter for closed fracture: Secondary | ICD-10-CM | POA: Diagnosis not present

## 2022-04-21 MED ORDER — TRAMADOL HCL 50 MG PO TABS: 50.0000 mg | ORAL_TABLET | Freq: Four times a day (QID) | ORAL | 0 refills | Status: AC | PRN

## 2022-04-21 MED ORDER — ASPIRIN 81 MG PO CHEW: 81.0000 mg | CHEWABLE_TABLET | Freq: Two times a day (BID) | ORAL | 0 refills | Status: DC

## 2022-04-21 NOTE — Progress Notes (Signed)
    Subjective:  Patient reports pain as mild to moderate.  Currently denies CP/SOB. She is having some nausea this morning. She reports that she is having some pain in her thigh this morning. Denies tingling and numbness in LE bilaterally.  Objective:   VITALS:   Vitals:   04/20/22 2150 04/21/22 0052 04/21/22 0553 04/21/22 0639  BP: (!) 187/71 (!) 167/64 (!) 187/70 (!) 163/63  Pulse: 75 77 66 71  Resp:   18   Temp: 98.2 F (36.8 C)  98.1 F (36.7 C)   TempSrc: Oral  Oral   SpO2: 94% 95% 96%   Weight:      Height:        She is lying in bed. NAD.  ABD soft Neurovascular intact Sensation intact distally Intact pulses distally Dorsiflexion/Plantar flexion intact Incision: dressing C/D/I No cellulitis present Compartment soft Painless log rolling of the hip.   Lab Results  Component Value Date   WBC 10.4 04/19/2022   HGB 11.4 (L) 04/19/2022   HCT 34.5 (L) 04/19/2022   MCV 94.0 04/19/2022   PLT 142 (L) 04/19/2022   BMET    Component Value Date/Time   NA 138 04/18/2022 0049   K 3.9 04/18/2022 0049   CL 104 04/18/2022 0049   CO2 23 04/18/2022 0049   GLUCOSE 142 (H) 04/18/2022 0049   BUN 22 04/18/2022 0049   CREATININE 0.96 04/18/2022 0049   CALCIUM 8.5 (L) 04/18/2022 0049   GFRNONAA 59 (L) 04/18/2022 0049     Assessment/Plan: 4 Days Post-Op   Principal Problem:   Subcapital fracture of femur, left, closed, initial encounter ([place]) Active Problems:   Hypothyroidism   Leukocytosis   Mild cognitive impairment   Fall at home, initial encounter   CKD (chronic kidney disease) stage 3, GFR 30-59 ml/min (HCC)   Hypertensive urgency   Osteoporosis   WBAT with walker DVT ppx: Aspirin, SCDs, TEDS PO pain control: Has mainly been taking tramadol with Hydrocodone sparingly.  PT/OT: She ambulated 8 feet with PT yesterday.  Dispo: Patient under care of the medical team, disposition per their recommendation. Discussion about CIR vs SNF. Pain medication and DVT ppx  printed in chart.    Charlott Rakes, PA-C 04/21/2022, 7:05 AM   [facility]  [place] [address], [address], [place], [place] [address] Phone: [phone] [email or web address] Facebook  [employer]

## 2022-04-21 NOTE — Progress Notes (Signed)
Physical Therapy Treatment Patient Details Name: Terri Ortega MRN: [id number] DOB: 08/08/1940 Today's Date: 04/21/2022   History of Present Illness 81 yo female presenting to ED on 9/13 with mechanical fall sustaining L femur neck fx. S/p L THA with anterior approach on 9/15.  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    PT Comments    Patient progressing well towards PT goals. Pain is more controlled today and pt is better able to initiate movement of LLE. Continues to require Mod A of 2 to stand from all surfaces and max cues for technique. Requires Min A for gait training with cues for sequencing as pt has difficulty taking a step without verbal cues/sequencing. Incontinent of urine so had multiple bouts of standing for pericare. Continues to require post acute rehab due to above deficits. Will follow.    Recommendations for follow up therapy are one component of a multi-disciplinary discharge planning process, led by the attending physician.  Recommendations may be updated based on patient status, additional functional criteria and insurance authorization.  Follow Up Recommendations  Acute inpatient rehab (3hours/day)     Assistance Recommended at Discharge Frequent or constant Supervision/Assistance  Patient can return home with the following A lot of help with walking and/or transfers;A lot of help with bathing/dressing/bathroom;Assistance with cooking/housework;Assist for transportation;Help with stairs or ramp for entrance   Equipment Recommendations  BSC/3in1;Wheelchair (measurements PT);Wheelchair cushion (measurements PT)    Recommendations for Other Services       Precautions / Restrictions Precautions Precautions: Fall Restrictions Weight Bearing Restrictions: Yes LLE Weight Bearing: Weight bearing as tolerated Other Position/Activity Restrictions: no hip precautions     Mobility   Bed Mobility Overal bed mobility: Needs Assistance Bed Mobility: Supine to Sit     Supine to sit: Mod assist, +2 for physical assistance, +2 for safety/equipment, HOB elevated     General bed mobility comments: Step by step cues for sequencing, assist with LLE, reaching for rail and to elevate trunk, scoot bottom to EOB with increased time/cues and effort. Heavily relying on rail.    Transfers Overall transfer level: Needs assistance Equipment used: Rolling walker (2 wheels) Transfers: Sit to/from Stand Sit to Stand: Mod assist, +2 physical assistance, Min assist           General transfer comment: Assist of 2 to power to standing with cues for hand placement/foot placement, stood from EOB x1, from [facility] x3, transferred to chair post ambulation. Less posterior lean today but favors LLE in abducted position.    Ambulation/Gait Ambulation/Gait assistance: Min assist, +2 safety/equipment Gait Distance (Feet): 14 Feet Assistive device: Rolling walker (2 wheels) Gait Pattern/deviations: Step-to pattern, Decreased step length - right, Decreased step length - left, Decreased stance time - left, Decreased stride length, Decreased dorsiflexion - left, Decreased weight shift to right, Decreased weight shift to left, Leaning posteriorly, Shuffle Gait velocity: reduced Gait velocity interpretation: <1.31 ft/sec, indicative of household ambulator   General Gait Details: Slow, shuffling like gait with assist for RW management and balance. Max A for cueing to take a step with LLE and sequence. Incontinent of urine   Stairs             Wheelchair Mobility    Modified Rankin (Stroke Patients Only)       Balance Overall balance assessment: Needs assistance Sitting-balance support: Feet supported, Single extremity supported Sitting balance-Leahy Scale: Fair     Standing balance support: During functional activity, Reliant on  assistive device for balance Standing balance-Leahy  Scale: Poor Standing balance comment: reliant on RW and Min A                            Cognition Arousal/Alertness: Awake/alert Behavior During Therapy: Flat affect Overall Cognitive Status: Impaired/Different from baseline Area of Impairment: Following commands, Awareness, Problem solving, Safety/judgement                       Following Commands: Follows one step commands inconsistently, Follows one step commands with increased time Safety/Judgement: Decreased awareness of safety, Decreased awareness of deficits Awareness: Intellectual, Emergent Problem Solving: Slow processing, Difficulty sequencing, Requires verbal cues, Requires tactile cues, Decreased initiation General Comments: Pt presenting with slower processing needing significant time to follow commands, motor plan, and answer questions. Requiring cues throughout, increased time.        Exercises      General Comments        Pertinent Vitals/Pain Pain Assessment Pain Assessment: Faces Faces Pain Scale: Hurts little more Pain Location: LLE Pain Descriptors / Indicators: Discomfort, Guarding, Sore Pain Intervention(s): Monitored during session, Repositioned, Limited activity within patient's tolerance    Home Living                          Prior Function            PT Goals (current goals can now be found in the care plan section) Progress towards PT goals: Progressing toward goals    Frequency    Min 5X/week      PT Plan Current plan remains appropriate    Co-evaluation              AM-PAC PT "6 Clicks" Mobility   Outcome Measure  Help needed turning from your back to your side while in a flat bed without using bedrails?: A Lot Help needed moving from lying on your back to sitting on the side of a flat bed without using bedrails?: A Lot Help needed moving to and from a bed to a chair (including a wheelchair)?: Total Help needed standing up from a chair  using your arms (e.g., wheelchair or bedside chair)?: Total Help needed to walk in hospital room?: Total Help needed climbing 3-5 steps with a railing? : Total 6 Click Score: 8    End of Session Equipment Utilized During Treatment: Gait belt Activity Tolerance: Patient tolerated treatment well Patient left: in chair;with call bell/phone within reach;with chair alarm set;with family/visitor present Nurse Communication: Mobility status PT Visit Diagnosis: Unsteadiness on feet (R26.81);Other abnormalities of gait and mobility (R26.89);Muscle weakness (generalized) (M62.81);History of falling (Z91.81);Difficulty in walking, not elsewhere classified (R26.2);Pain Pain - Right/Left: Left Pain - part of body: Hip     Time: 6063-0160 PT Time Calculation (min) (ACUTE ONLY): 24 min  Charges:  $Gait Training: 8-22 mins $Therapeutic Activity: 8-22 mins                     Marisa Severin, PT, DPT Acute Rehabilitation Services Secure chat preferred Office [place] 04/21/2022, 11:30 AM

## 2022-04-21 NOTE — Progress Notes (Signed)
Patient's daughter is asking to case management to call her with update on rehab placement. We continue to monitor.

## 2022-04-21 NOTE — Plan of Care (Signed)
  Problem: Elimination: Goal: Will not experience complications related to bowel motility Outcome: Not Progressing   Problem: Elimination: Goal: Will not experience complications related to urinary retention Outcome: Not Progressing   Problem: Skin Integrity: Goal: Risk for impaired skin integrity will decrease Outcome: Not Progressing   Problem: Safety: Goal: Ability to remain free from injury will improve Outcome: Not Progressing   Problem: Pain Management: Goal: Pain level will decrease with appropriate interventions Outcome: Not Progressing

## 2022-04-21 NOTE — Progress Notes (Signed)
PROGRESS NOTE    Terri Ortega  KGU:[id number] DOB: 03-05-1941 DOA: 04/15/2022 PCP: Lawerance Cruel, MD   Brief Narrative:  Terri Ortega is a 81 y.o. female with medical history significant of pmh HTN, hypothyroidism, mild cognitive impairment osteoporosis who presents after having a fall.  Found to have a subcapital fracture of the left femoral neck. Orthopedics consulted and hospitalist called for admission.  Patient remains medically stable for discharge, currently awaiting safe disposition.  Pending transition to SNF, bed available but insurance approval pending.  Assessment & Plan:   Principal Problem:   Subcapital fracture of femur, left, closed, initial encounter [facility]) Active Problems:   Fall at home, initial encounter   Hypertensive urgency   Leukocytosis   CKD (chronic kidney disease) stage 3, GFR 30-59 ml/min (HCC)   Osteoporosis   Hypothyroidism   Mild cognitive impairment  Subcapital fracture of the left femoral neck secondary to fall -Acute s/p mechanical fall -Orthopedics following - L hip arthroplasty, anterior approach 04/17/22, tolerated well -Pain currently well controlled on current regimen, off IV narcotics on varying doses of hydrocodone with appropriate response -PT/OT following -pending improvement plan for SNF   Hypertensive urgency, resolved -Secondary to above and pain -Complicated by noncompliance with morning BP meds.   Leukocytosis, reactive secondary to above -Resolved   Chronic kidney disease stage IIIa -At baseline, IV fluids discontinued in the setting of increased p.o. intake   Osteoporosis Patient had a bone density study with she was diagnosed with osteoporosis based on T score of -2.8 of the left femoral neck back in 05/2020.  Patient is on ibandronate 150 mg monthly, calcium, and vitamin D. -Continue regimen in the outpatient setting   Hypothyroidism -Continue levothyroxine   Mild cognitive impairment Patient is  followed by Dr. Jaynee Eagles of neurology in the outpatient setting. -Continue donepezil   DVT prophylaxis: lovenox Code Status: Full Family Communication: Daughter at bedside  Status is: inpt  Dispo: The patient is from: home              Anticipated d/c is to: SNF pending approval              Anticipated d/c date is: Imminent pending above              Patient currently medically stable for discharge  Consultants:  ortho  Procedures:  L femur ORIF 04/16/22  Antimicrobials:  perioperatively   Subjective: No acute issues/events overnight, pain well controlled, denies nausea vomiting diarrhea constipation headache fevers chills or chest pain  Objective: Vitals:   04/21/22 0052 04/21/22 0553 04/21/22 0639 04/21/22 0744  BP: (!) 167/64 (!) 187/70 (!) 163/63 (!) 153/58  Pulse: 77 66 71 77  Resp:  18  17  Temp:  98.1 F (36.7 C)  98.4 F (36.9 C)  TempSrc:  Oral  Oral  SpO2: 95% 96%  95%  Weight:      Height:        Intake/Output Summary (Last 24 hours) at 04/21/2022 0805 Last data filed at 04/21/2022 0745 Gross per 24 hour  Intake 0 ml  Output 600 ml  Net -600 ml    Filed Weights   04/15/22 1446  Weight: 79.4 kg    Examination:  General:  Pleasantly resting in bed, No acute distress. HEENT:  Normocephalic atraumatic.  Sclerae nonicteric, noninjected.  Extraocular movements intact bilaterally. Neck:  Without mass or deformity.  Trachea is midline. Lungs:  Clear to auscultate bilaterally without rhonchi, wheeze, or rales.  Heart:  Regular rate and rhythm.  Without murmurs, rubs, or gallops. Abdomen:  Soft, nontender, nondistended.  Without guarding or rebound. Extremities: Without cyanosis, clubbing, edema, or obvious deformity.  Postop wound clean dry intact Vascular:  Dorsalis pedis and posterior tibial pulses palpable bilaterally. Skin:  Warm and dry, no erythema, no ulcerations.   Data Reviewed: I have personally reviewed following labs and imaging  studies  CBC: Recent Labs  Lab 04/15/22 1614 04/16/22 0022 04/17/22 0554 04/18/22 0049 04/19/22 0011  WBC 10.9* 16.6* 15.7* 14.7* 10.4  NEUTROABS 8.8*  --   --   --   --   HGB 12.7 13.7 13.3 12.3 11.4*  HCT 38.2 40.4 38.5 36.6 34.5*  MCV 95.5 94.0 91.2 93.8 94.0  PLT 196 201 168 157 142*    Basic Metabolic Panel: Recent Labs  Lab 04/15/22 1712 04/16/22 0022 04/17/22 0554 04/18/22 0049  NA 141 141 139 138  K 4.3 3.9 3.8 3.9  CL 110 111 107 104  CO2 '23 24 24 23  '$ GLUCOSE 113* 163* 121* 142*  BUN '20 16 16 22  '$ CREATININE 1.05* 1.04* 0.80 0.96  CALCIUM 8.5* 8.6* 8.9 8.5*    GFR: Estimated Creatinine Clearance: 46.9 mL/min (by C-G formula based on SCr of 0.96 mg/dL). Liver Function Tests: No results for input(s): "AST", "ALT", "ALKPHOS", "BILITOT", "PROT", "ALBUMIN" in the last 168 hours. No results for input(s): "LIPASE", "AMYLASE" in the last 168 hours. No results for input(s): "AMMONIA" in the last 168 hours. Coagulation Profile: Recent Labs  Lab 04/15/22 1614  INR 1.0    Cardiac Enzymes: Recent Labs  Lab 04/15/22 1716  CKTOTAL 51    BNP (last 3 results) No results for input(s): "PROBNP" in the last 8760 hours. HbA1C: No results for input(s): "HGBA1C" in the last 72 hours. CBG: No results for input(s): "GLUCAP" in the last 168 hours. Lipid Profile: No results for input(s): "CHOL", "HDL", "LDLCALC", "TRIG", "CHOLHDL", "LDLDIRECT" in the last 72 hours. Thyroid Function Tests: No results for input(s): "TSH", "T4TOTAL", "FREET4", "T3FREE", "THYROIDAB" in the last 72 hours. Anemia Panel: No results for input(s): "VITAMINB12", "FOLATE", "FERRITIN", "TIBC", "IRON", "RETICCTPCT" in the last 72 hours. Sepsis Labs: No results for input(s): "PROCALCITON", "LATICACIDVEN" in the last 168 hours.  Recent Results (from the past 240 hour(s))  Surgical pcr screen     Status: None   Collection Time: 04/17/22  3:56 PM   Specimen: Nasal Mucosa; Nasal Swab  Result  Value Ref Range Status   MRSA, PCR NEGATIVE NEGATIVE Final   Staphylococcus aureus NEGATIVE NEGATIVE Final    Comment: (NOTE) The Xpert SA Assay (FDA approved for NASAL specimens in patients 85 years of age and older), is one component of a comprehensive surveillance program. It is not intended to diagnose infection nor to guide or monitor treatment. Performed at [facility], [place] [address], [place], [place] [address]      Radiology Studies: No results found.  Scheduled Meds:  aspirin  81 mg Oral BID   docusate sodium  100 mg Oral BID   donepezil  10 mg Oral QHS   levothyroxine  88 mcg Oral QAC breakfast   senna  1 tablet Oral BID   traMADol  50 mg Oral Q6H   Continuous Infusions:  methocarbamol (ROBAXIN) IV       LOS: 6 days   Time spent: 5mn  Reginald Mangels C Marveen Donlon, DO [facility]  If 7PM-7AM, please contact night-coverage [email or web address]  04/21/2022, 8:05 AM

## 2022-04-21 NOTE — Care Management Important Message (Signed)
Important Message  Patient Details  Name: Terri Ortega MRN: [id number] Date of Birth: June 07, 1941   Medicare Important Message Given:  Yes     Hannah Beat 04/21/2022, 12:07 PM

## 2022-04-22 DIAGNOSIS — S72012A Unspecified intracapsular fracture of left femur, initial encounter for closed fracture: Secondary | ICD-10-CM | POA: Diagnosis not present

## 2022-04-22 DIAGNOSIS — W010XXA Fall on same level from slipping, tripping and stumbling without subsequent striking against object, initial encounter: Secondary | ICD-10-CM | POA: Diagnosis not present

## 2022-04-22 DIAGNOSIS — N183 Chronic kidney disease, stage 3 unspecified: Secondary | ICD-10-CM | POA: Diagnosis not present

## 2022-04-22 DIAGNOSIS — W19XXXA Unspecified fall, initial encounter: Secondary | ICD-10-CM | POA: Diagnosis not present

## 2022-04-22 LAB — BASIC METABOLIC PANEL
Anion gap: 7 (ref 5–15)
BUN: 11 mg/dL (ref 8–23)
CO2: 28 mmol/L (ref 22–32)
Calcium: 8.5 mg/dL — ABNORMAL LOW (ref 8.9–10.3)
Chloride: 102 mmol/L (ref 98–111)
Creatinine, Ser: 0.73 mg/dL (ref 0.44–1.00)
GFR, Estimated: >60 mL/min (ref >60)
Glucose, Bld: 98 mg/dL (ref 70–99)
Potassium: 3.7 mmol/L (ref 3.5–5.1)
Sodium: 137 mmol/L (ref 135–145)

## 2022-04-22 LAB — CBC
HCT: 32.1 % — ABNORMAL LOW (ref 36.0–46.0)
Hemoglobin: 11 g/dL — ABNORMAL LOW (ref 12.0–15.0)
MCH: 31.5 pg (ref 26.0–34.0)
MCHC: 34.3 g/dL (ref 30.0–36.0)
MCV: 92 fL (ref 80.0–100.0)
Platelets: 227 10*3/uL (ref 150–400)
RBC: 3.49 MIL/uL — ABNORMAL LOW (ref 3.87–5.11)
RDW: 12.7 % (ref 11.5–15.5)
WBC: 9.5 10*3/uL (ref 4.0–10.5)
nRBC: 0 % (ref 0.0–0.2)

## 2022-04-22 MED ORDER — AMLODIPINE BESYLATE 5 MG PO TABS
5.0000 mg | ORAL_TABLET | Freq: Every day | ORAL | Status: DC
  Administered 2022-04-22 – 2022-04-23 (×2): 5 mg via ORAL
  Filled 2022-04-22 (×2): qty 1

## 2022-04-22 MED ORDER — ORAL CARE MOUTH RINSE: 15.0000 mL | OROMUCOSAL | Status: DC | PRN

## 2022-04-22 NOTE — Progress Notes (Signed)
Physical Therapy Treatment Patient Details Name: Terri Ortega MRN: [id number] DOB: 07/18/1941 Today's Date: 04/22/2022   History of Present Illness 81 yo female presenting to ED on 9/13 with mechanical fall sustaining L femur neck fx. S/p L THA with anterior approach on 9/15.  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    PT Comments    Patient seen in conjunction with OT to maximize patient's tolerance. Patient with cognitive impairments impacting progress. She requires extensive amount of time to sequencing gait despite cues. Able to stand from bed with minA+2 and progressed to ability to stand from [facility] with min guard+2 for safety and patient preference. Requires extended amount of time to ambulate 6' with RW and minA+2 along. Updated discharge recommendation to SNF at discharge to maximize functional mobility and safety.     Recommendations for follow up therapy are one component of a multi-disciplinary discharge planning process, led by the attending physician.  Recommendations may be updated based on patient status, additional functional criteria and insurance authorization.  Follow Up Recommendations  Skilled nursing-short term rehab (<3 hours/day) Can patient physically be transported by private vehicle: No   Assistance Recommended at Discharge Frequent or constant Supervision/Assistance  Patient can return home with the following A lot of help with walking and/or transfers;A lot of help with bathing/dressing/bathroom;Assistance with cooking/housework;Assist for transportation;Help with stairs or ramp for entrance   Equipment Recommendations  BSC/3in1;Wheelchair (measurements PT);Wheelchair cushion (measurements PT)    Recommendations for Other Services       Precautions / Restrictions Precautions Precautions: Fall Restrictions Weight Bearing Restrictions: Yes LLE Weight Bearing: Weight bearing  as tolerated Other Position/Activity Restrictions: no hip precautions     Mobility  Bed Mobility Overal bed mobility: Needs Assistance Bed Mobility: Supine to Sit     Supine to sit: Min assist, +2 for physical assistance     General bed mobility comments: assist for LLE management off bed and slight trunk elevation with patient reaching for therapist's hand for assist. Cues for sequencing and hand placement to assist    Transfers Overall transfer level: Needs assistance Equipment used: Rolling Terri Ortega (2 wheels) Transfers: Sit to/from Stand Sit to Stand: Min assist, +2 safety/equipment, Min guard           General transfer comment: minA+2 to stand from elevated bed surface. Min guard+2 for safety from [facility]. Cues for hand placement    Ambulation/Gait Ambulation/Gait assistance: Min assist, +2 safety/equipment Gait Distance (Feet): 6 Feet Assistive device: Rolling Terri Ortega (2 wheels) Gait Pattern/deviations: Step-to pattern, Decreased step length - right, Decreased step length - left, Decreased stance time - left, Decreased stride length, Decreased dorsiflexion - left, Decreased weight shift to right, Decreased weight shift to left, Leaning posteriorly, Shuffle Gait velocity: decreased     General Gait Details: very slow gait speed with extensive time to process sequencing with cues. Assist for RW management.   Stairs             Wheelchair Mobility    Modified Rankin (Stroke Patients Only)       Balance Overall balance assessment: Needs assistance Sitting-balance support: Feet supported, Single extremity supported Sitting balance-Terri Ortega: Fair     Standing balance support: During functional activity, Reliant on assistive device for balance Standing balance-Terri Ortega: Poor                              Cognition Arousal/Alertness:  Awake/alert Behavior During Therapy: Flat affect Overall Cognitive Status: History of cognitive impairments -  at baseline                                 General Comments: hx of mild dementia however seems more advanced than chart states        Exercises      General Comments        Pertinent Vitals/Pain Pain Assessment Pain Assessment: Faces Faces Pain Ortega: Hurts little more Pain Location: LLE Pain Descriptors / Indicators: Discomfort, Guarding, Sore Pain Intervention(s): Monitored during session    Home Living                          Prior Function            PT Goals (current goals can now be found in the care plan section) Acute Rehab PT Goals PT Goal Formulation: With patient/family Time For Goal Achievement: 05/02/22 Potential to Achieve Goals: Good Progress towards PT goals: Progressing toward goals    Frequency    Min 3X/week      PT Plan Discharge plan needs to be updated;Frequency needs to be updated    Co-evaluation              AM-PAC PT "6 Clicks" Mobility   Outcome Measure  Help needed turning from your back to your side while in a flat bed without using bedrails?: A Lot Help needed moving from lying on your back to sitting on the side of a flat bed without using bedrails?: A Lot Help needed moving to and from a bed to a chair (including a wheelchair)?: Total Help needed standing up from a chair using your arms (e.g., wheelchair or bedside chair)?: Total Help needed to walk in hospital room?: Total Help needed climbing 3-5 steps with a railing? : Total 6 Click Score: 8    End of Session Equipment Utilized During Treatment: Gait belt Activity Tolerance: Patient tolerated treatment well Patient left: in chair;with call bell/phone within reach;with chair alarm set Nurse Communication: Mobility status PT Visit Diagnosis: Unsteadiness on feet (R26.81);Other abnormalities of gait and mobility (R26.89);Muscle weakness (generalized) (M62.81);History of falling (Z91.81);Difficulty in walking, not elsewhere classified  (R26.2);Pain Pain - Right/Left: Left Pain - part of body: Hip     Time: 7510-2585 PT Time Calculation (min) (ACUTE ONLY): 32 min  Charges:  $Gait Training: 8-22 mins                     Derin Granquist A. Gilford Rile PT, DPT Acute Rehabilitation Services Office [phone]    Linna Hoff 04/22/2022, 10:33 AM

## 2022-04-22 NOTE — Progress Notes (Signed)
Mobility Specialist - Progress Note   04/22/22 1208  Mobility  Activity Transferred from chair to bed  Level of Assistance Moderate assist, patient does 50-74% (+2)  Assistive Device Front wheel walker  LLE Weight Bearing WBAT  Distance Ambulated (ft) 0 ft  Activity Response Tolerated well  $Mobility charge 1 Mobility    Pt received in recliner requesting to move back to bed. ModA (+2 for safety) required to stand and pivot from recliner to bed. Left in bed with call bell in reach and all needs met.   Paulla Dolly Mobility Specialist

## 2022-04-22 NOTE — Progress Notes (Signed)
[facility]                                                                              Terri Ortega, is a 81 y.o. female, DOB - 1940/12/08, ZOX:[id number] Admit date - 04/15/2022    Outpatient Primary MD for the patient is Terri Cruel, MD  LOS - 7  days  Chief Complaint  Patient presents with   Fall       Brief summary   Terri Ortega is a 81 y.o. female with medical history significant of pmh HTN, hypothyroidism, mild cognitive impairment osteoporosis who presents after having a fall.  Found to have a subcapital fracture of the left femoral neck. Orthopedics consulted.    Assessment & Plan    Principal Problem: Mechanical fall with subcapital fracture of femur, left, closed(HCC) -Patient presented with fall at home, found to have subcapital fracture of left femoral neck.  Orthopedics consulted -Underwent left total hip arthroplasty on 9/15 -Pain controlled, orthopedics following -Awaiting SNF, pending authorization.  Active Problems:    Hypertensive urgency -Likely worse due to pain, BP still not well controlled -Started on Norvasc 5 mg daily   Leukocytosis, likely reactive -Resolved   Chronic kidney disease stage IIIa -Creatinine currently at baseline 0.7   Osteoporosis Patient had a bone density study with she was diagnosed with osteoporosis based on T score of -2.8 of the left femoral neck back in 05/2020.   -On ibandronate 150 mg monthly, calcium, and vitamin D outpatient, continue.   Hypothyroidism -Continue Synthroid   Mild cognitive impairment, ?  Dementia Patient is followed by Dr. Jaynee Eagles of neurology in the outpatient setting. -Continue donepezil UA 9/13 negative for UTI   obesity Estimated body mass index is 30.04 kg/m as calculated from the following:   Height as of this encounter: '5\' 4"'$  (1.626 m).   Weight as of this encounter: 79.4 kg.  Code Status: Full code DVT Prophylaxis:  SCDs Start: 04/17/22  2133   Level of Care: Level of care: Med-Surg Family Communication: Updated patient'   Disposition Plan:      Remains inpatient appropriate: Awaiting SNF pending authorization   Procedures:  Left total hip arthroplasty on 9/15  Consultants:   Ortho   Antimicrobials:   Anti-infectives (From admission, onward)    Start     Dose/Rate Route Frequency Ordered Stop   04/17/22 2300  ceFAZolin (ANCEF) IVPB 2g/100 mL premix        2 g 200 mL/hr over 30 Minutes Intravenous Every 6 hours 04/17/22 2132 04/18/22 0730   04/17/22 1645  ceFAZolin (ANCEF) IVPB 2g/100 mL premix        2 g 200 mL/hr over 30 Minutes Intravenous On call to O.R. 04/17/22 1626 04/17/22 1828   04/17/22 1626  ceFAZolin (ANCEF) 2-4 GM/100ML-% IVPB       Note to Pharmacy: Roosvelt Maser N: cabinet override      04/17/22 1626 04/18/22 0429          Medications  aspirin  81 mg Oral BID   docusate sodium  100 mg Oral BID   donepezil  10 mg Oral QHS  levothyroxine  88 mcg Oral QAC breakfast   senna  1 tablet Oral BID   traMADol  50 mg Oral Q6H      Subjective:   Melody Comas was seen and examined today.  No acute complaints patient denies dizziness, chest pain, shortness of breath, abdominal pain, N/V/D/C, new weakness, numbess, tingling. No acute events overnight.    Objective:   Vitals:   04/21/22 1626 04/21/22 2105 04/22/22 0542 04/22/22 0727  BP: (!) 150/56 (!) 180/66 (!) 173/65 (!) 174/78  Pulse: 64 64 64 60  Resp: 18   16  Temp: 98.2 F (36.8 C) 98.1 F (36.7 C) (!) 97.5 F (36.4 C) 98.7 F (37.1 C)  TempSrc: Oral Oral Oral Oral  SpO2: 94% 95% 95% 95%  Weight:      Height:       No intake or output data in the 24 hours ending 04/22/22 1430   Wt Readings from Last 3 Encounters:  04/15/22 79.4 kg  10/06/21 80.5 kg  07/11/21 80.7 kg     Exam General: Alert and oriented x 3, NAD, appears comfortable Cardiovascular: S1 S2 auscultated,  RRR Respiratory: Clear to auscultation  bilaterally, no wheezing Gastrointestinal: Soft, nontender, nondistended, + bowel sounds Ext: no pedal edema bilaterally Neuro: Strength 5/5 upper and lower extremities bilaterally Psych: Normal affect and demeanor    Data Reviewed:  I have personally reviewed following labs    CBC Lab Results  Component Value Date   WBC 9.5 04/22/2022   RBC 3.49 (L) 04/22/2022   HGB 11.0 (L) 04/22/2022   HCT 32.1 (L) 04/22/2022   MCV 92.0 04/22/2022   MCH 31.5 04/22/2022   PLT 227 04/22/2022   MCHC 34.3 04/22/2022   RDW 12.7 04/22/2022   LYMPHSABS 1.4 04/15/2022   MONOABS 0.5 04/15/2022   EOSABS 0.1 04/15/2022   BASOSABS 0.1 48/18/5631     Last metabolic panel Lab Results  Component Value Date   NA 137 04/22/2022   K 3.7 04/22/2022   CL 102 04/22/2022   CO2 28 04/22/2022   BUN 11 04/22/2022   CREATININE 0.73 04/22/2022   GLUCOSE 98 04/22/2022   GFRNONAA >60 04/22/2022   GFRAA >60 01/06/2019   CALCIUM 8.5 (L) 04/22/2022   PROT 7.0 12/30/2018   ALBUMIN 3.8 12/30/2018   BILITOT 0.9 12/30/2018   ALKPHOS 53 12/30/2018   AST 22 12/30/2018   ALT 15 12/30/2018   ANIONGAP 7 04/22/2022    CBG (last 3)  No results for input(s): "GLUCAP" in the last 72 hours.    Coagulation Profile: Recent Labs  Lab 04/15/22 1614  INR 1.0     Radiology Studies: I have personally reviewed the imaging studies  No results found.     Estill Cotta M.D. [facility] 04/22/2022, 2:30 PM  Available via Epic secure chat 7am-7pm After 7 pm, please refer to night coverage provider listed on amion.

## 2022-04-22 NOTE — TOC Initial Note (Addendum)
Transition of Care [facility]) - Initial/Assessment Note    Patient Details  Name: Terri Ortega MRN: [id number] Date of Birth: 29-Nov-1940  Transition of Care [facility]) CM/SW Contact:    Amador Cunas, [place] Phone Number: 04/22/2022, 8:48 AM  Clinical Narrative:  Spoke with pt's dtr re dc plan. Dtr aware CIR has denied and is agreeable to SNF. Dtr requesting [facility] where her MIL is a resident. Voicemail left for [facility] at [facility] requesting return call. Dtr aware she will need to choose from available offers if [facility] does not accept. Provided current offers and dtr is considering [facility] as a back up facility.   [employer] auth request submitted (ref # [id number]), will need update them with facility choice when known.    UPDATE 1100: spoke to Katie with [facility] who reports they are willing to review pt's information and will notify SW of decision. Pt's dtr updated, she is hopeful for [facility] but willing to consider [facility] if [facility] unable to offer.   [name], MSW, LCSW [phone] (coverage)                 Expected Discharge Plan: [place] Barriers to Discharge: Insurance Authorization, SNF Pending bed offer   Patient Goals and CMS Choice     Choice offered to / list presented to : Adult Children  Expected Discharge Plan and Services Expected Discharge Plan: [employer]       Living arrangements for the past 2 months: Single Family Home                                      Prior Living Arrangements/Services Living arrangements for the past 2 months: Single Family Home Lives with:: Spouse Patient language and need for interpreter reviewed:: No Do you feel safe going back to the place where you live?: Yes      Need for Family Participation in Patient Care: Yes (Comment) Care giver support system in place?: Yes (comment)   Criminal Activity/Legal Involvement Pertinent to Current  Situation/Hospitalization: No - Comment as needed  Activities of Daily Living Home Assistive Devices/Equipment: None ADL Screening (condition at time of admission) Patient's cognitive ability adequate to safely complete daily activities?: Yes Is the patient deaf or have difficulty hearing?: No Does the patient have difficulty seeing, even when wearing glasses/contacts?: No Does the patient have difficulty concentrating, remembering, or making decisions?: No Patient able to express need for assistance with ADLs?: Yes Does the patient have difficulty dressing or bathing?: No Independently performs ADLs?: No Communication: Independent Dressing (OT): Needs assistance Is this a change from baseline?: Change from baseline, expected to last <3days Grooming: Needs assistance Is this a change from baseline?: Change from baseline, expected to last <3 days Feeding: Independent Bathing: Needs assistance Is this a change from baseline?: Change from baseline, expected to last <3 days Toileting: Needs assistance Is this a change from baseline?: Change from baseline, expected to last <3 days In/Out Bed: Needs assistance Is this a change from baseline?: Change from baseline, expected to last <3 days Walks in Home: Independent with device (comment) Does the patient have difficulty walking or climbing stairs?: Yes Weakness of Legs: Left Weakness of Arms/Hands: None  Permission Sought/Granted Permission sought to share information with : Facility Art therapist granted to share information with : Yes, Verbal Permission Granted  Emotional Assessment       Orientation: : Oriented to Self, Oriented to Place, Oriented to  Time, Oriented to Situation, Fluctuating Orientation (Suspected and/or reported Sundowners) Alcohol / Substance Use: Not Applicable Psych Involvement: No (comment)  Admission diagnosis:  Fall from slip, trip, or stumble, initial encounter  [W01.0XXA] Subcapital fracture of femur, left, closed, initial encounter ([place]) [S72.012A] Subcapital fracture of left hip, closed, initial encounter [facility]) [S72.012A] Patient Active Problem List   Diagnosis Date Noted   Subcapital fracture of femur, left, closed, initial encounter ([place]) 04/15/2022   Fall at home, initial encounter 04/15/2022   CKD (chronic kidney disease) stage 3, GFR 30-59 ml/min ([place]) 04/15/2022   Hypertensive urgency 04/15/2022   Osteoporosis 04/15/2022   Aortic valve disease 07/10/2021   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   Orthostatic dizziness 11/14/2020   Educated about COVID-19 virus infection 12/21/2019   Mild cognitive impairment 09/06/2019   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   Preop cardiovascular exam 12/20/2018   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   Uterine cancer ([place]) 03/28/2018   Arthralgia of hip or thigh 03/28/2018   Benign essential HTN 03/28/2018   Overweight 03/28/2018   Heart disease 11/09/2014   Leukocytosis 44/08/270   Diastolic congestive heart failure ([place]) 10/30/2014   Hypothyroidism 10/30/2014   Chest pain at rest 10/30/2014   Chest pain 10/30/2014   Endometrial cancer ([place]) 01/30/2014   Dyspnea 01/11/2014   Edema 01/11/2014   Low back pain with right-sided sciatica 12/13/2013   Degeneration of lumbar or lumbosacral intervertebral disc 12/13/2013   Sciatica 12/13/2013   PCP:  Lawerance Cruel, MD Pharmacy:   [place], [place] - [address] [place] [place] [address] Phone: [phone] Fax: [phone]     Social Determinants of Health (SDOH) Interventions    Readmission Risk Interventions     No data to display

## 2022-04-22 NOTE — NC FL2 (Signed)
[place] LEVEL OF CARE SCREENING TOOL     IDENTIFICATION  Patient Name: Terri Ortega Birthdate: [birth year] Sex: female Admission Date (Current Location): 04/15/2022  [facility] and [place] Number:  Herbalist and Address:  [facility] [place] [address], [place], [place] [address]      Provider Number: [id number]  Attending Physician Name and Address:  Mendel Corning, MD  Relative Name and Phone Number:       Current Level of Care: Hospital Recommended Level of Care: [place] Prior Approval Number:    Date Approved/Denied:   PASRR Number: [id number]  Discharge Plan: SNF    Current Diagnoses: Patient Active Problem List   Diagnosis Date Noted   Subcapital fracture of femur, left, closed, initial encounter ([place]) 04/15/2022   Fall at home, initial encounter 04/15/2022   CKD (chronic kidney disease) stage 3, GFR 30-59 ml/min ([place]) 04/15/2022   Hypertensive urgency 04/15/2022   Osteoporosis 04/15/2022   Aortic valve disease 07/10/2021   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   Orthostatic dizziness 11/14/2020   Educated about COVID-19 virus infection 12/21/2019   Mild cognitive impairment 09/06/2019   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   Preop cardiovascular exam 12/20/2018   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   Uterine cancer ([place]) 03/28/2018   Arthralgia of hip or thigh 03/28/2018   Benign essential HTN 03/28/2018   Overweight 03/28/2018   Heart disease 11/09/2014   Leukocytosis 13/03/6577   Diastolic congestive heart failure ([place]) 10/30/2014   Hypothyroidism 10/30/2014   Chest pain at rest 10/30/2014   Chest pain 10/30/2014   Endometrial cancer ([place]) 01/30/2014   Dyspnea 01/11/2014   Edema 01/11/2014   Low back pain with right-sided sciatica 12/13/2013    Degeneration of lumbar or lumbosacral intervertebral disc 12/13/2013   Sciatica 12/13/2013    Orientation RESPIRATION BLADDER Height & Weight     Self, Time, Situation, Place  Normal Continent Weight: 175 lb (79.4 kg) Height:  '5\' 4"'$  (162.6 cm)  BEHAVIORAL SYMPTOMS/MOOD NEUROLOGICAL BOWEL NUTRITION STATUS      Continent    AMBULATORY STATUS COMMUNICATION OF NEEDS Skin   Limited Assist Verbally Surgical wounds                       Personal Care Assistance Level of Assistance  Bathing, Feeding, Dressing Bathing Assistance: Limited assistance Feeding assistance: Independent Dressing Assistance: Limited assistance     Functional Limitations Info  Sight, Hearing, Speech Sight Info: Adequate Hearing Info: Adequate Speech Info: Adequate    SPECIAL CARE FACTORS FREQUENCY  PT (By licensed PT), OT (By licensed OT)                    Contractures Contractures Info: Not present    Additional Factors Info                  Current Medications (04/22/2022):  This is the current hospital active medication list Current Facility-Administered Medications  Medication Dose Route Frequency Provider Last Rate Last Admin   acetaminophen (TYLENOL) tablet 325-650 mg  325-650 mg Oral Q6H PRN Swinteck, Aaron Edelman, MD       alum & mag hydroxide-simeth (MAALOX/MYLANTA) 200-200-20 MG/5ML suspension 30 mL  30 mL Oral Q4H PRN Swinteck, Aaron Edelman, MD       aspirin chewable tablet 81 mg  81 mg  Oral BID Rod Can, MD   81 mg at 04/22/22 3500   diphenhydrAMINE (BENADRYL) 12.5 MG/5ML elixir 12.5-25 mg  12.5-25 mg Oral Q4H PRN Swinteck, Aaron Edelman, MD       docusate sodium (COLACE) capsule 100 mg  100 mg Oral BID Rod Can, MD   100 mg at 04/22/22 0836   donepezil (ARICEPT) tablet 10 mg  10 mg Oral QHS Swinteck, Aaron Edelman, MD   10 mg at 04/21/22 2125   hydrALAZINE (APRESOLINE) injection 10 mg  10 mg Intravenous Q4H PRN Rod Can, MD   10 mg at 04/21/22 0557   HYDROcodone-acetaminophen  (NORCO) 7.5-325 MG per tablet 1-2 tablet  1-2 tablet Oral Q4H PRN Rod Can, MD       HYDROcodone-acetaminophen (NORCO/VICODIN) 5-325 MG per tablet 1-2 tablet  1-2 tablet Oral Q4H PRN Rod Can, MD   1 tablet at 04/17/22 2140   levothyroxine (SYNTHROID) tablet 88 mcg  88 mcg Oral QAC breakfast Rod Can, MD   88 mcg at 04/22/22 0836   menthol-cetylpyridinium (CEPACOL) lozenge 3 mg  1 lozenge Oral PRN Swinteck, Aaron Edelman, MD       Or   phenol (CHLORASEPTIC) mouth spray 1 spray  1 spray Mouth/Throat PRN Swinteck, Aaron Edelman, MD       methocarbamol (ROBAXIN) tablet 500 mg  500 mg Oral Q6H PRN Swinteck, Aaron Edelman, MD       Or   methocarbamol (ROBAXIN) 500 mg in dextrose 5 % 50 mL IVPB  500 mg Intravenous Q6H PRN Swinteck, Aaron Edelman, MD       metoCLOPramide (REGLAN) tablet 5-10 mg  5-10 mg Oral Q8H PRN Swinteck, Aaron Edelman, MD       Or   metoCLOPramide (REGLAN) injection 5-10 mg  5-10 mg Intravenous Q8H PRN Swinteck, Aaron Edelman, MD       ondansetron (ZOFRAN) tablet 4 mg  4 mg Oral Q6H PRN Swinteck, Aaron Edelman, MD       Or   ondansetron (ZOFRAN) injection 4 mg  4 mg Intravenous Q6H PRN Rod Can, MD   4 mg at 04/21/22 9381   Oral care mouth rinse  15 mL Mouth Rinse PRN Rai, Ripudeep K, MD       polyethylene glycol (MIRALAX / GLYCOLAX) packet 17 g  17 g Oral Daily PRN Rod Can, MD   17 g at 04/18/22 2047   senna (SENOKOT) tablet 8.6 mg  1 tablet Oral BID Rod Can, MD   8.6 mg at 04/22/22 0836   traMADol (ULTRAM) tablet 50 mg  50 mg Oral Q6H Rod Can, MD   50 mg at 04/22/22 0518     Discharge Medications: Please see discharge summary for a list of discharge medications.  Relevant Imaging Results:  Relevant Lab Results:   Additional Information SS# [id number]  [name] [place], [place]

## 2022-04-22 NOTE — Progress Notes (Signed)
Occupational Therapy Treatment Patient Details Name: Terri Ortega MRN: [id number] DOB: 25-Jun-1941 Today's Date: 04/22/2022   History of present illness 81 yo female presenting to ED on 9/13 with mechanical fall sustaining L femur neck fx. S/p L THA with anterior approach on 9/15.  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   OT comments  Pt self assisting to EOB with verbal cues for technique and assist for LLE and to raise trunk. Pt able to scoot to EOB with min guard assist. Pt stood from bed with min assist and BSC x 2 with min guard assist, second person for safety. Pt with slow processing speed during ambulation. Left up in chair set up with cranberry juice, no interest in eating breakfast. Updated discharge plan to SNF.   Recommendations for follow up therapy are one component of a multi-disciplinary discharge planning process, led by the attending physician.  Recommendations may be updated based on patient status, additional functional criteria and insurance authorization.    Follow Up Recommendations  Skilled nursing-short term rehab (<3 hours/day)    Assistance Recommended at Discharge Frequent or constant Supervision/Assistance  Patient can return home with the following  A little help with walking and/or transfers;A lot of help with bathing/dressing/bathroom;Assist for transportation;Help with stairs or ramp for entrance;Assistance with cooking/housework;Direct supervision/assist for medications management;Direct supervision/assist for financial management   Equipment Recommendations  BSC/3in1;Wheelchair (measurements OT);Wheelchair cushion (measurements OT)    Recommendations for Other Services      Precautions / Restrictions Precautions Precautions: Fall Restrictions Weight Bearing Restrictions: Yes LLE Weight Bearing: Weight bearing as tolerated Other Position/Activity Restrictions: no hip  precautions       Mobility Bed Mobility Overal bed mobility: Needs Assistance Bed Mobility: Supine to Sit     Supine to sit: Min assist     General bed mobility comments: assist for LLE management off bed and slight trunk elevation with patient reaching for therapist's hand for assist. Cues for sequencing and hand placement to assist    Transfers Overall transfer level: Needs assistance Equipment used: Rolling walker (2 wheels) Transfers: Sit to/from Stand Sit to Stand: Min assist, +2 safety/equipment, Min guard           General transfer comment: minA+2 to stand from elevated bed surface. Min guard+2 for safety from [facility]. Cues for hand placement     Balance Overall balance assessment: Needs assistance Sitting-balance support: Feet supported, Single extremity supported Sitting balance-Leahy Scale: Fair Sitting balance - Comments: Moments of min guard assist with UE support otherwise needing Min-mod A due to posterior lean. Postural control: Posterior lean Standing balance support: During functional activity, Reliant on assistive device for balance Standing balance-Leahy Scale: Poor Standing balance comment: reliant on RW and Min A, can release one hand momentarily                           ADL either performed or assessed with clinical judgement   ADL Overall ADL's : Needs assistance/impaired                             Toileting- Clothing Manipulation and Hygiene: Total assistance;Sit to/from stand       Functional mobility during ADLs: Minimal assistance;+2 for safety/equipment General ADL Comments: pt with urinary incontinence    Extremity/Trunk Assessment              Vision  Perception     Praxis      Cognition Arousal/Alertness: Awake/alert Behavior During Therapy: Flat affect Overall Cognitive Status: History of cognitive impairments - at baseline                                 General Comments:  very slow with mobility, irritable        Exercises      Shoulder Instructions       General Comments      Pertinent Vitals/ Pain       Pain Assessment Pain Assessment: Faces Faces Pain Scale: Hurts little more Pain Location: LLE Pain Descriptors / Indicators: Discomfort, Guarding, Sore Pain Intervention(s): Monitored during session, Repositioned  Home Living Family/patient expects to be discharged to:: Private residence                                        Prior Functioning/Environment              Frequency  Min 2X/week        Progress Toward Goals  OT Goals(current goals can now be found in the care plan section)  Progress towards OT goals: Progressing toward goals  Acute Rehab OT Goals OT Goal Formulation: With patient Time For Goal Achievement: 05/02/22 Potential to Achieve Goals: [place] Discharge plan needs to be updated    Co-evaluation                 AM-PAC OT "6 Clicks" Daily Activity     Outcome Measure   Help from another person eating meals?: None Help from another person taking care of personal grooming?: A Little Help from another person toileting, which includes using toliet, bedpan, or urinal?: Total Help from another person bathing (including washing, rinsing, drying)?: A Lot Help from another person to put on and taking off regular upper body clothing?: A Little Help from another person to put on and taking off regular lower body clothing?: Total 6 Click Score: 14    End of Session Equipment Utilized During Treatment: Gait belt;Rolling walker (2 wheels)  OT Visit Diagnosis: Unsteadiness on feet (R26.81);Other abnormalities of gait and mobility (R26.89);Muscle weakness (generalized) (M62.81);Pain   Activity Tolerance Patient tolerated treatment well   Patient Left in chair;with call bell/phone within reach;with chair alarm set;with family/visitor present   Nurse Communication          Time:  3953-2023 OT Time Calculation (min): 34 min  Charges: OT General Charges $OT Visit: 1 Visit OT Treatments $Self Care/Home Management : 8-22 mins  Cleta Alberts, OTR/L Acute Rehabilitation Services Office: [phone]  Malka So 04/22/2022, 10:51 AM

## 2022-04-22 NOTE — TOC Progression Note (Signed)
Transition of Care [facility]) - Progression Note    Patient Details  Name: Terri Ortega MRN: [id number] Date of Birth: Nov 02, 1940  Transition of Care [facility]) CM/SW [place], [place] Phone Number: 04/22/2022, 1:23 PM  Clinical Narrative:     [place], left voice for Katie to return the call.   Expected Discharge Plan: Skilled Nursing Facility Barriers to Discharge: Ship broker, SNF Pending bed offer  Expected Discharge Plan and Services Expected Discharge Plan: [place] arrangements for the past 2 months: Single Family Home                                       Social Determinants of Health (SDOH) Interventions    Readmission Risk Interventions     No data to display

## 2022-04-23 DIAGNOSIS — W19XXXA Unspecified fall, initial encounter: Secondary | ICD-10-CM | POA: Diagnosis not present

## 2022-04-23 DIAGNOSIS — E039 Hypothyroidism, unspecified: Secondary | ICD-10-CM

## 2022-04-23 DIAGNOSIS — N183 Chronic kidney disease, stage 3 unspecified: Secondary | ICD-10-CM | POA: Diagnosis not present

## 2022-04-23 DIAGNOSIS — W010XXA Fall on same level from slipping, tripping and stumbling without subsequent striking against object, initial encounter: Secondary | ICD-10-CM | POA: Diagnosis not present

## 2022-04-23 DIAGNOSIS — S72012A Unspecified intracapsular fracture of left femur, initial encounter for closed fracture: Secondary | ICD-10-CM | POA: Diagnosis not present

## 2022-04-23 MED ORDER — SORBITOL 70 % SOLN
960.0000 mL | TOPICAL_OIL | Freq: Once | ORAL | Status: AC
  Administered 2022-04-23: 960 mL via RECTAL
  Filled 2022-04-23: qty 240

## 2022-04-23 MED ORDER — AMLODIPINE BESYLATE 5 MG PO TABS: 5.0000 mg | ORAL_TABLET | Freq: Every day | ORAL | Status: DC

## 2022-04-23 MED ORDER — DOCUSATE SODIUM 100 MG PO CAPS: 100.0000 mg | ORAL_CAPSULE | Freq: Two times a day (BID) | ORAL | 0 refills | Status: DC

## 2022-04-23 MED ORDER — AMLODIPINE BESYLATE 10 MG PO TABS: 10.0000 mg | ORAL_TABLET | Freq: Every day | ORAL | Status: DC

## 2022-04-23 MED ORDER — AMLODIPINE BESYLATE 5 MG PO TABS
5.0000 mg | ORAL_TABLET | Freq: Once | ORAL | Status: AC
  Administered 2022-04-23: 5 mg via ORAL
  Filled 2022-04-23: qty 1

## 2022-04-23 MED ORDER — POLYETHYLENE GLYCOL 3350 17 G PO PACK: 17.0000 g | PACK | Freq: Every day | ORAL | 0 refills | Status: DC | PRN

## 2022-04-23 NOTE — TOC Transition Note (Signed)
Transition of Care [facility]) - CM/SW Discharge Note   Patient Details  Name: Terri Ortega MRN: [id number] Date of Birth: [birth year]  Transition of Care [facility]) CM/SW Contact:  Vinie Sill, LCSW Phone Number: 04/23/2022, 1:18 PM   Clinical Narrative:     Patient will Discharge to: [place] Discharge Date: 04/23/2022 Family Notified: daughter Transport By: Corey Harold  Per MD patient is ready for discharge. RN, patient, and facility notified of discharge. Discharge Summary sent to facility. RN given number for [phone], Room- [facility]. Ambulance transport requested for patient.   Clinical Social Worker signing off.  Thurmond Butts, MSW, LCSW Clinical Social Worker     Final next level of care: Skilled Nursing Facility Barriers to Discharge: Barriers Resolved   Patient Goals and CMS Choice     Choice offered to / list presented to : Adult Children  Discharge Placement              Patient chooses bed at: [facility] Patient to be transferred to facility by: [place] Name of family member notified: daughter Patient and family notified of of transfer: 04/23/22  Discharge Plan and Services                                     Social Determinants of Health (SDOH) Interventions     Readmission Risk Interventions     No data to display

## 2022-04-23 NOTE — Discharge Summary (Signed)
Physician Discharge Summary   Patient: Terri Ortega MRN: [id number] DOB: September 09, 1940  Admit date:     04/15/2022  Discharge date: 04/23/22  Discharge Physician: Estill Cotta, MD    PCP: Lawerance Cruel, MD   Recommendations at discharge:   Weightbearing as tolerated left lower extremity, continue PT OT  Discharge Diagnoses:    Subcapital fracture of femur, left, closed, initial encounter Terri Ortega)   Fall at home   Hypertensive urgency   Leukocytosis   CKD (chronic kidney disease) stage 3a, GFR 30-59 ml/min (HCC)   Osteoporosis   Hypothyroidism   Mild cognitive impairment    Ortega Course: Terri Ortega is a 81 y.o. female with medical history significant of pmh HTN, hypothyroidism, mild cognitive impairment osteoporosis who presents after having a fall.  Found to have a subcapital fracture of the left femoral neck. Orthopedics consulted.   Assessment and Plan:  Mechanical fall with subcapital fracture of femur, left, closed(HCC) -Patient presented with fall at home, found to have subcapital fracture of left femoral neck.  Orthopedics consulted -Underwent left total hip arthroplasty on 9/15 -Pain controlled, continue tramadol, aspirin for DVT prophylaxis     Hypertensive urgency -Likely worse due to pain, BP not well controlled -Patient was started on Norvasc, titrated up to 10 mg daily   Leukocytosis, likely reactive -Resolved   Chronic kidney disease stage IIIa -Creatinine currently at baseline 0.7   Osteoporosis Patient had a bone density study with she was diagnosed with osteoporosis based on T score of -2.8 of the left femoral neck back in 05/2020.   -On ibandronate 150 mg monthly, calcium, and vitamin D outpatient, continue.   Hypothyroidism -Continue Synthroid   Mild cognitive impairment, ?  Dementia Patient is followed by Dr. Jaynee Eagles of neurology in the outpatient setting. -Continue donepezil UA 9/13 negative for UTI   obesity Estimated  body mass index is 30.04 kg/m as calculated from the following:   Height as of this encounter: '5\' 4"'$  (1.626 m).   Weight as of this encounter: 79.4 kg.         Pain control - [employer] Controlled Substance Reporting System database was reviewed. and patient was instructed, not to drive, operate heavy machinery, perform activities at heights, swimming or participation in water activities or provide baby-sitting services while on Pain, Sleep and Anxiety Medications; until their outpatient Physician has advised to do so again. Also recommended to not to take more than prescribed Pain, Sleep and Anxiety Medications.  Consultants: Orthopedics Procedures performed: Left total hip arthroplasty on 9/15 Disposition: Skilled nursing facility Diet recommendation:  Discharge Diet Orders (From admission, onward)     Start     Ordered   04/23/22 0000  Diet - low sodium heart healthy        04/23/22 1101           Cardiac diet DISCHARGE MEDICATION: Allergies as of 04/23/2022       Reactions   Percocet [oxycodone-acetaminophen] Nausea And Vomiting        Medication List     TAKE these medications    amLODipine 5 MG tablet Commonly known as: NORVASC Take 1 tablet (5 mg total) by mouth daily. What changed:  medication strength how much to take   aspirin 81 MG chewable tablet Commonly known as: Aspirin Childrens Chew 1 tablet (81 mg total) by mouth 2 (two) times daily with a meal.   CALCIUM PO Take 1 tablet by mouth daily.   docusate sodium 100  MG capsule Commonly known as: COLACE Take 1 capsule (100 mg total) by mouth 2 (two) times daily.   donepezil 10 MG tablet Commonly known as: ARICEPT Take 1 tablet (10 mg total) by mouth at bedtime.   ibandronate 150 MG tablet Commonly known as: BONIVA Take 150 mg by mouth every 30 (thirty) days. Pt takes on the 25th of each month   levothyroxine 88 MCG tablet Commonly known as: SYNTHROID Take 88 mcg by mouth daily before  breakfast.   polyethylene glycol 17 g packet Commonly known as: MIRALAX / GLYCOLAX Take 17 g by mouth daily as needed for mild constipation.   traMADol 50 MG tablet Commonly known as: Ultram Take 1 tablet (50 mg total) by mouth every 6 (six) hours as needed for up to 7 days for moderate pain or severe pain.   VITAMIN D3 PO Take 2,000 Int'l Units by mouth daily.               Discharge Care Instructions  (From admission, onward)           Start     Ordered   04/23/22 0000  If the dressing is still on your incision site when you go home, remove it on the third day after your surgery date. Remove dressing if it begins to fall off, or if it is dirty or damaged before the third day.        04/23/22 1101            Follow-up Information     S[place], Aaron Edelman, MD Follow up in 2 week(s).   Specialty: Orthopedic Surgery Why: For suture removal, For wound re-check Contact information: [address] [place] [address] [phone]         Lawerance Cruel, MD. Schedule an appointment as soon as possible for a visit in 2 week(s).   Specialty: Family Medicine Why: for Ortega follow-up Contact information: [place] [address] [phone]         Minus Breeding, MD .   Specialty: Cardiology Contact information: [address] [place] [address] [phone]                Discharge Exam: Danley Danker Weights   04/15/22 1446  Weight: 79.4 kg   S: No acute complaints, BP somewhat uncontrolled.  No headache, blurry vision.  Pain controlled.  Vitals:   04/22/22 1532 04/22/22 2018 04/23/22 0520 04/23/22 0814  BP: (!) 152/59 (!) 141/63 (!) 171/60 (!) 183/66  Pulse: 62 67 66 65  Resp: '16 17 17 16  '$ Temp: 98.4 F (36.9 C) 98.8 F (37.1 C) 98 F (36.7 C) 98.3 F (36.8 C)  TempSrc: Oral Oral Oral Oral  SpO2: 93% 95% 96% 96%  Weight:      Height:        Physical Exam General: Alert and  oriented x 3, NAD Cardiovascular: S1 S2 clear, RRR.  Respiratory: CTAB, no wheezing, rales or rhonchi Gastrointestinal: Soft, nontender, nondistended, NBS Ext: no pedal edema bilaterally Neuro: no new deficits Psych: Normal affect and demeanor, pleasant  Condition at discharge: fair  The results of significant diagnostics from this hospitalization (including imaging, microbiology, ancillary and laboratory) are listed below for reference.   Imaging Studies: DG Pelvis Portable  Result Date: 04/17/2022 CLINICAL DATA:  Status post left hip arthroplasty. EXAM: PORTABLE PELVIS 1-2 VIEWS COMPARISON:  Earlier today FINDINGS: Postop change from left hip arthroplasty identified. The left hip arthroplasty device appears  located. No signs of periprosthetic fracture or dislocation. Note: The distal end of the femoral component is excluded from these field of view. IMPRESSION: Status post left hip arthroplasty Electronically Signed   By: Kerby Moors M.D.   On: 04/17/2022 20:05   DG HIP UNILAT WITH PELVIS 1V LEFT  Result Date: 04/17/2022 CLINICAL DATA:  Left femoral neck fracture EXAM: DG HIP (WITH OR WITHOUT PELVIS) 1V*L* COMPARISON:  04/15/2022 FLUOROSCOPY TIME:  Radiation Exposure Index (as provided by the fluoroscopic device): 0.62 mGy If the device does not provide the exposure index: Fluoroscopy Time:  13 seconds Number of Acquired Images:  2 FINDINGS: Left hip prosthesis is noted in satisfactory position. No acute bony abnormality is noted. IMPRESSION: Status post left hip replacement. Electronically Signed   By: Inez Catalina M.D.   On: 04/17/2022 19:07   DG C-Arm 1-60 Min-No Report  Result Date: 04/17/2022 Fluoroscopy was utilized by the requesting physician.  No radiographic interpretation.   DG C-Arm 1-60 Min-No Report  Result Date: 04/17/2022 Fluoroscopy was utilized by the requesting physician.  No radiographic interpretation.   DG Knee Left Port  Result Date: 04/16/2022 CLINICAL DATA:   Fracture left femoral neck. EXAM: PORTABLE LEFT KNEE - 1-2 VIEW COMPARISON:  None Available. FINDINGS: Tiny joint effusion. Mild medial compartment joint space narrowing. No acute fracture is seen. No dislocation. IMPRESSION: Mild medial compartment osteoarthritis. Electronically Signed   By: Yvonne Kendall M.D.   On: 04/16/2022 14:42   DG Chest 1 View  Result Date: 04/15/2022 CLINICAL DATA:  [id number] EXAM: CHEST  1 VIEW COMPARISON:  10/31/2014 FINDINGS: Cardiac enlargement without heart failure. Lungs are clear without infiltrate or effusion. No acute skeletal abnormality. IMPRESSION: Prominent heart size without acute abnormality. Electronically Signed   By: Franchot Gallo M.D.   On: 04/15/2022 16:12   DG Hip Unilat With Pelvis 2-3 Views Left  Result Date: 04/15/2022 CLINICAL DATA:  Fall EXAM: DG HIP (WITH OR WITHOUT PELVIS) 2-3V LEFT COMPARISON:  None Available. FINDINGS: Subcapital femoral neck fracture left with impaction and mild displacement. Left hip joint normal. No other fracture in the pelvis. Lumbar scoliosis and degenerative change. IMPRESSION: Subcapital fracture left femoral neck. Electronically Signed   By: Franchot Gallo M.D.   On: 04/15/2022 16:11    Microbiology: Results for orders placed or performed during the Ortega encounter of 04/15/22  Surgical pcr screen     Status: None   Collection Time: 04/17/22  3:56 PM   Specimen: Nasal Mucosa; Nasal Swab  Result Value Ref Range Status   MRSA, PCR NEGATIVE NEGATIVE Final   Staphylococcus aureus NEGATIVE NEGATIVE Final    Comment: (NOTE) The Xpert SA Assay (FDA approved for NASAL specimens in patients 82 years of age and older), is one component of a comprehensive surveillance program. It is not intended to diagnose infection nor to guide or monitor treatment. Performed at [facility], [place] [address], [place], [place] [address]     Labs: CBC: Recent Labs  Lab 04/17/22 0554 04/18/22 0049 04/19/22 0011  04/22/22 0728  WBC 15.7* 14.7* 10.4 9.5  HGB 13.3 12.3 11.4* 11.0*  HCT 38.5 36.6 34.5* 32.1*  MCV 91.2 93.8 94.0 92.0  PLT 168 157 142* 867   Basic Metabolic Panel: Recent Labs  Lab 04/17/22 0554 04/18/22 0049 04/22/22 0728  NA 139 138 137  K 3.8 3.9 3.7  CL 107 104 102  CO2 '24 23 28  '$ GLUCOSE 121* 142* 98  BUN 16 22 11  CREATININE 0.80 0.96 0.73  CALCIUM 8.9 8.5* 8.5*   Liver Function Tests: No results for input(s): "AST", "ALT", "ALKPHOS", "BILITOT", "PROT", "ALBUMIN" in the last 168 hours. CBG: No results for input(s): "GLUCAP" in the last 168 hours.  Discharge time spent: greater than 30 minutes.  Signed: Estill Cotta, MD [facility] 04/23/2022

## 2022-04-23 NOTE — Progress Notes (Signed)
[facility]. Receiving nurse- Boyce Medici notified patient had  bright red blood when I assisted her from the chair to the bed. I also informed receiving nurse on call provider says its ok for patient to be discharged

## 2022-04-23 NOTE — Progress Notes (Signed)
Bright red blood ( estimated 20 ml) noted when patient was assisted from the chair to bed after she passed gas. VSS stable. According to the patient this is the first time is happening [place] notified.

## 2022-04-23 NOTE — Progress Notes (Signed)
OK for patient to be discharged per Raenette Rover, NP

## 2022-04-23 NOTE — TOC Progression Note (Addendum)
Transition of Care [facility]) - Progression Note    Patient Details  Name: Maikayla Beggs MRN: [id number] Date of Birth: July 30, 1941  Transition of Care [facility]) CM/SW [place],  Phone Number: 04/23/2022, 10:06 AM  Clinical Narrative:     Received call from [place] today- SNF confirmed Bed offer  CSW will updated insurance of SNF choice -insurance approved - reference # [id number] 9/21-9/25.   Updated family, MD and RNCM.   TOC will continue to follow and assist with discharge planning.   Thurmond Butts, MSW, LCSW Clinical Social Worker    Expected Discharge Plan: Skilled Nursing Facility Barriers to Discharge: Ship broker, SNF Pending bed offer  Expected Discharge Plan and Services Expected Discharge Plan: [place] arrangements for the past 2 months: Single Family Home                                       Social Determinants of Health (SDOH) Interventions    Readmission Risk Interventions     No data to display

## 2022-04-23 NOTE — Progress Notes (Signed)
Call report to [facility] attempted 3 times without answer.

## 2022-04-23 NOTE — Progress Notes (Signed)
       CROSS COVER NOTE  NAME: Terri Ortega MRN: [id number] DOB : 08-11-1940    Date of Service   04/23/2022   HPI/Events of Note   Per RN, ~20 cc of rectal bright red blood was noted to be running down patients leg after patient passed gas. There is a d/c order for her tonight and transportation is at bedside with plans to take her to [facility].   Patient has been constipated for ~72 hours and was given an enema today without complications according to the patient. Patient was successful in having a BM per RN report. She has no hx of hemorrhoids and is on aspirin.   Her VSS are stable and is said to be A/O x4. Last HGB 11, it ranged from 11-13.7 during her stay here and did not have any previous episodes of bleeding.   Patient denies abdominal, pelvic, or rectal pain or discomfort. RN reassessed patient and denies visibly seeing any blood leaking from patients rectum.   Rectal bleed could be associated to enema administration. She has remained hemodynamically stable with no recurrent episode and can he safety discharged at this moment.    Interventions

## 2022-04-24 ENCOUNTER — Non-Acute Institutional Stay (SKILLED_NURSING_FACILITY): Payer: Medicare PPO | Admitting: Nurse Practitioner

## 2022-04-24 ENCOUNTER — Encounter: Payer: Self-pay | Admitting: Nurse Practitioner

## 2022-04-24 DIAGNOSIS — K5901 Slow transit constipation: Secondary | ICD-10-CM | POA: Insufficient documentation

## 2022-04-24 DIAGNOSIS — D500 Iron deficiency anemia secondary to blood loss (chronic): Secondary | ICD-10-CM | POA: Diagnosis not present

## 2022-04-24 DIAGNOSIS — N183 Chronic kidney disease, stage 3 unspecified: Secondary | ICD-10-CM

## 2022-04-24 DIAGNOSIS — S72012A Unspecified intracapsular fracture of left femur, initial encounter for closed fracture: Secondary | ICD-10-CM | POA: Diagnosis not present

## 2022-04-24 DIAGNOSIS — I10 Essential (primary) hypertension: Secondary | ICD-10-CM | POA: Diagnosis not present

## 2022-04-24 DIAGNOSIS — M8000XA Age-related osteoporosis with current pathological fracture, unspecified site, initial encounter for fracture: Secondary | ICD-10-CM

## 2022-04-24 DIAGNOSIS — D649 Anemia, unspecified: Secondary | ICD-10-CM | POA: Insufficient documentation

## 2022-04-24 DIAGNOSIS — E039 Hypothyroidism, unspecified: Secondary | ICD-10-CM

## 2022-04-24 DIAGNOSIS — G3184 Mild cognitive impairment, so stated: Secondary | ICD-10-CM

## 2022-04-24 DIAGNOSIS — D72829 Elevated white blood cell count, unspecified: Secondary | ICD-10-CM | POA: Diagnosis not present

## 2022-04-24 NOTE — Assessment & Plan Note (Signed)
Resolved

## 2022-04-24 NOTE — Progress Notes (Unsigned)
Location:  [place] Room Number: NO/42/A Place of Service:  SNF (31) Provider:  Gayle Collard X, NP  Patient Care Team: Lawerance Cruel, MD as PCP - General (Family Medicine) Minus Breeding, MD as PCP - Cardiology (Cardiology)  Extended Emergency Contact Information Primary Emergency Contact: Maltz,Bill Address: [address]          [place], [place] [address] Johnnette Litter of [place] Phone: [phone] Relation: Spouse Secondary Emergency Contact: Angelia Mould, [place] [address] Johnnette Litter of [place] Phone: [phone] Relation: Daughter  Code Status:  FULL Goals of care: Advanced Directive information    04/24/2022    8:30 AM  Advanced Directives  Does Patient Have a Medical Advance Directive? No  Would patient like information on creating a medical advance directive? No - Patient declined     Chief Complaint  Patient presents with   Hospitalization Follow-up    Patient is here for follow up after hospital stay And for medication review    HPI:  Pt is a 81 y.o. female seen today for a hospital f/u s/p admission to SNF [facility]  Hospitalized 04/15/22-04/23/22 left total hip arthroplasty 04/17/22, ASA for DVT prophylaxis, for subcapital fracture of left femur sustained from fall at home, f/u Dr. Lyla Glassing 05/08/22, pain is managed with Tramadol  HTN f/u Cardiology, taking Amlodipine   Leukocytosis resolved, wbc 9.5 04/22/22  CKD Bun/creat 11/0.73 04/22/22  OP, T score -2.8 left femoral neck 05/2020, on Ibandronate, Ca, Vit D  Hypothyroidism, takes Levothyroxine  Mild cognitive impairment, followed by Dr. Jaynee Eagles Neurology, on Donepezil  Obesity  Post op  anemia, Hgb 11.0 04/22/22  Constipation, has MiraLax available to her.  Past Medical History:  Diagnosis Date   Graves disease    HTN (hypertension)    Low back pain    Dr. Ronnald Ramp   Osteoporosis    Pneumonia 2012   Radiation 03/20/14, 03/27/14, 04/05/14, 04/12/14, 04/17/14    bracytherapy to proximal vagina 30 gray   Scoliosis    Uterine cancer ([place])    surgery and radiation x 5 treatments-last 9'15   Vitamin D deficiency    Past Surgical History:  Procedure Laterality Date   BUNIONECTOMY     left   CATARACT EXTRACTION, BILATERAL Bilateral    last done 11-21-14   COLONOSCOPY WITH PROPOFOL N/A 12/25/2014   Procedure: COLONOSCOPY WITH PROPOFOL;  Surgeon: Garlan Fair, MD;  Location: [facility];  Service: Endoscopy;  Laterality: N/A;   FOOT SURGERY Left 2004   Dr Johnnye Sima   JOINT REPLACEMENT     RTKA   ROBOTIC ASSISTED TOTAL HYSTERECTOMY WITH BILATERAL SALPINGO OOPHERECTOMY  01/30/14   with lymph node biopsy   TOTAL HIP ARTHROPLASTY Left 04/17/2022   Procedure: TOTAL HIP ARTHROPLASTY ANTERIOR APPROACH;  Surgeon: Rod Can, MD;  Location: [facility];  Service: Orthopedics;  Laterality: Left;   TOTAL KNEE ARTHROPLASTY     right   TOTAL KNEE REVISION Right 01/04/2019   Procedure: TOTAL KNEE REVISION;  Surgeon: Gaynelle Arabian, MD;  Location: [facility];  Service: Orthopedics;  Laterality: Right;  180mn with block    Allergies  Allergen Reactions   Percocet [Oxycodone-Acetaminophen] Nausea And Vomiting    Outpatient Encounter Medications as of 04/24/2022  Medication Sig   amLODipine (NORVASC) 10 MG tablet Take 1 tablet (10 mg total) by mouth daily.   Cholecalciferol (VITAMIN D3 PO) Take 2,000 Int'l Units by mouth daily.   docusate  sodium (COLACE) 100 MG capsule Take 1 capsule (100 mg total) by mouth 2 (two) times daily.   donepezil (ARICEPT) 10 MG tablet Take 1 tablet (10 mg total) by mouth at bedtime.   ibandronate (BONIVA) 150 MG tablet Take 150 mg by mouth every 30 (thirty) days. Pt takes on the 25th of each month   levothyroxine (SYNTHROID) 88 MCG tablet Take 88 mcg by mouth daily before breakfast.    polyethylene glycol (MIRALAX / GLYCOLAX) 17 g packet Take 17 g by mouth daily as needed for mild constipation.   traMADol (ULTRAM) 50 MG tablet Take 1  tablet (50 mg total) by mouth every 6 (six) hours as needed for up to 7 days for moderate pain or severe pain.   [DISCONTINUED] aspirin (ASPIRIN CHILDRENS) 81 MG chewable tablet Chew 1 tablet (81 mg total) by mouth 2 (two) times daily with a meal.   [DISCONTINUED] CALCIUM PO Take 1 tablet by mouth daily.   No facility-administered encounter medications on file as of 04/24/2022.    Review of Systems  Constitutional:  Negative for activity change, appetite change and fatigue.  HENT:  Negative for congestion and trouble swallowing.   Eyes:  Negative for visual disturbance.  Respiratory:  Negative for cough, shortness of breath and wheezing.   Cardiovascular:  Positive for leg swelling. Negative for chest pain and palpitations.  Gastrointestinal:  Negative for abdominal pain, constipation and vomiting.  Genitourinary:  Negative for dysuria, frequency and urgency.  Musculoskeletal:  Positive for arthralgias, back pain, gait problem and joint swelling.  Skin:  Negative for color change.  Neurological:  Negative for speech difficulty and headaches.       Memory lapses.   Psychiatric/Behavioral:  Negative for confusion and sleep disturbance. The patient is not nervous/anxious.     Immunization History  Administered Date(s) Administered   Influenza Split 05/25/2011, 05/30/2012, 05/17/2013, 06/19/2014   Influenza, High Dose Seasonal PF 05/13/2017, 04/24/2019   Influenza,inj,quad, With Preservative 04/24/2019   Pneumococcal Conjugate-13 04/12/2014   Pneumococcal Polysaccharide-23 08/27/2008   Tdap 12/06/2008   Pertinent  Health Maintenance Due  Topic Date Due   INFLUENZA VACCINE  03/03/2022   DEXA SCAN  Completed      04/21/2022    8:11 PM 04/22/2022    7:45 AM 04/22/2022   10:36 PM 04/23/2022    9:21 AM 04/24/2022    8:30 AM  [place] in the past year?     1  Patient Fall Risk Level High fall risk High fall risk High fall risk High fall risk High fall risk  Patient at Risk for  Falls Due to     History of fall(s)  Fall risk Follow up     Falls evaluation completed   Functional Status Survey:    Vitals:   04/24/22 0833  Weight: 175 lb (79.4 kg)  Height: '5\' 4"'$  (1.626 m)   Body mass index is 30.04 kg/m. Physical Exam Vitals and nursing note reviewed.  Constitutional:      Appearance: Normal appearance. She is well-developed.  HENT:     Head: Normocephalic and atraumatic.     Nose: Nose normal.     Mouth/Throat:     Mouth: Mucous membranes are moist.  Eyes:     Extraocular Movements: Extraocular movements intact.     Conjunctiva/sclera: Conjunctivae normal.     Pupils: Pupils are equal, round, and reactive to light.  Cardiovascular:     Rate and Rhythm: Normal rate and regular rhythm.  Heart sounds: No murmur heard. Pulmonary:     Effort: Pulmonary effort is normal.     Breath sounds: No wheezing or rales.  Abdominal:     General: Bowel sounds are normal.     Palpations: Abdomen is soft.     Tenderness: There is no abdominal tenderness.  Musculoskeletal:        General: Normal range of motion.     Cervical back: Normal range of motion and neck supple.     Right lower leg: Edema present.     Left lower leg: Edema present.     Comments: LLE 1+, RLE trace edema.   Skin:    General: Skin is warm and dry.     Comments: Left hip surgical wound is covered in dressing.   Neurological:     General: No focal deficit present.     Mental Status: She is alert and oriented to person, place, and time.     Motor: No weakness.     Gait: Gait abnormal.  Psychiatric:        Mood and Affect: Mood normal.        Behavior: Behavior normal.        Thought Content: Thought content normal.     Labs reviewed: Recent Labs    04/17/22 0554 04/18/22 0049 04/22/22 0728  NA 139 138 137  K 3.8 3.9 3.7  CL 107 104 102  CO2 '24 23 28  '$ GLUCOSE 121* 142* 98  BUN '16 22 11  '$ CREATININE 0.80 0.96 0.73  CALCIUM 8.9 8.5* 8.5*   No results for input(s): "AST",  "ALT", "ALKPHOS", "BILITOT", "PROT", "ALBUMIN" in the last 8760 hours. Recent Labs    04/15/22 1614 04/16/22 0022 04/18/22 0049 04/19/22 0011 04/22/22 0728  WBC 10.9*   < > 14.7* 10.4 9.5  NEUTROABS 8.8*  --   --   --   --   HGB 12.7   < > 12.3 11.4* 11.0*  HCT 38.2   < > 36.6 34.5* 32.1*  MCV 95.5   < > 93.8 94.0 92.0  PLT 196   < > 157 142* 227   < > = values in this interval not displayed.   Lab Results  Component Value Date   TSH 0.383 10/31/2014   Lab Results  Component Value Date   HGBA1C 5.5 10/31/2014   Lab Results  Component Value Date   CHOL 147 10/31/2014   HDL 52 10/31/2014   LDLCALC 87 10/31/2014   TRIG 38 10/31/2014   CHOLHDL 2.8 10/31/2014    Significant Diagnostic Results in last 30 days:  DG Knee Left Port  Result Date: 04/16/2022 CLINICAL DATA:  Fracture left femoral neck. EXAM: PORTABLE LEFT KNEE - 1-2 VIEW COMPARISON:  None Available. FINDINGS: Tiny joint effusion. Mild medial compartment joint space narrowing. No acute fracture is seen. No dislocation. IMPRESSION: Mild medial compartment osteoarthritis. Electronically Signed   By: Yvonne Kendall M.D.   On: 04/16/2022 14:42   DG Chest 1 View  Result Date: 04/15/2022 CLINICAL DATA:  [id number] EXAM: CHEST  1 VIEW COMPARISON:  10/31/2014 FINDINGS: Cardiac enlargement without heart failure. Lungs are clear without infiltrate or effusion. No acute skeletal abnormality. IMPRESSION: Prominent heart size without acute abnormality. Electronically Signed   By: Franchot Gallo M.D.   On: 04/15/2022 16:12   DG Hip Unilat With Pelvis 2-3 Views Left  Result Date: 04/15/2022 CLINICAL DATA:  Fall EXAM: DG HIP (WITH OR WITHOUT PELVIS) 2-3V LEFT COMPARISON:  None  Available. FINDINGS: Subcapital femoral neck fracture left with impaction and mild displacement. Left hip joint normal. No other fracture in the pelvis. Lumbar scoliosis and degenerative change. IMPRESSION: Subcapital fracture left femoral neck. Electronically Signed    By: Franchot Gallo M.D.   On: 04/15/2022 16:11    Assessment/Plan Benign essential HTN Blood pressure is controlled, f/u Cardiology, taking Amlodipine   Subcapital fracture of femur, left, closed, initial encounter [facility]) Hospitalized 04/15/22-04/23/22 left total hip arthroplasty 04/17/22, ASA for DVT prophylaxis, for subcapital fracture of left femur sustained from fall at home, f/u Dr. Lyla Glassing 05/08/22, pain is managed with Tramadol  CKD (chronic kidney disease) stage 3, GFR 30-59 ml/min (HCC) Bun/creat 11/0.73 04/22/22  Osteoporosis T score -2.8 left femoral neck 05/2020, on Ibandronate, Ca, Vit D  Hypothyroidism  takes Levothyroxine  Mild cognitive impairment Mild cognitive impairment, followed by Dr. Jaynee Eagles Neurology, on Donepezil  Blood loss anemia Hgb 11.0 04/22/22  Leukocytosis Resolved.   Slow transit constipation has MiraLax available to her.     Family/ staff Communication: plan of care reviewed with the patient and charge nurse.   Labs/tests ordered: none  Time spend 35 minutes.

## 2022-04-24 NOTE — Assessment & Plan Note (Signed)
Hgb 11.0 04/22/22

## 2022-04-24 NOTE — Assessment & Plan Note (Signed)
takes Levothyroxine

## 2022-04-24 NOTE — Assessment & Plan Note (Signed)
Bun/creat 11/0.73 04/22/22

## 2022-04-24 NOTE — Assessment & Plan Note (Signed)
T score -2.8 left femoral neck 05/2020, on Ibandronate, Ca, Vit D

## 2022-04-24 NOTE — Assessment & Plan Note (Signed)
Hospitalized 04/15/22-04/23/22 left total hip arthroplasty 04/17/22, ASA for DVT prophylaxis, for subcapital fracture of left femur sustained from fall at home, f/u Dr. Lyla Glassing 05/08/22, pain is managed with Tramadol

## 2022-04-24 NOTE — Assessment & Plan Note (Signed)
Blood pressure is controlled, f/u Cardiology, taking Amlodipine

## 2022-04-24 NOTE — Assessment & Plan Note (Signed)
Mild cognitive impairment, followed by Dr. Jaynee Eagles Neurology, on Donepezil

## 2022-04-24 NOTE — Assessment & Plan Note (Signed)
has MiraLax available to her.

## 2022-04-27 ENCOUNTER — Encounter: Payer: Self-pay | Admitting: Nurse Practitioner

## 2022-04-27 ENCOUNTER — Non-Acute Institutional Stay (SKILLED_NURSING_FACILITY): Payer: Medicare PPO | Admitting: Nurse Practitioner

## 2022-04-27 DIAGNOSIS — E039 Hypothyroidism, unspecified: Secondary | ICD-10-CM

## 2022-04-27 DIAGNOSIS — K5901 Slow transit constipation: Secondary | ICD-10-CM

## 2022-04-27 DIAGNOSIS — M8000XA Age-related osteoporosis with current pathological fracture, unspecified site, initial encounter for fracture: Secondary | ICD-10-CM

## 2022-04-27 DIAGNOSIS — G3184 Mild cognitive impairment, so stated: Secondary | ICD-10-CM

## 2022-04-27 DIAGNOSIS — S72012A Unspecified intracapsular fracture of left femur, initial encounter for closed fracture: Secondary | ICD-10-CM | POA: Diagnosis not present

## 2022-04-27 DIAGNOSIS — D500 Iron deficiency anemia secondary to blood loss (chronic): Secondary | ICD-10-CM

## 2022-04-27 DIAGNOSIS — I10 Essential (primary) hypertension: Secondary | ICD-10-CM

## 2022-04-27 DIAGNOSIS — R110 Nausea: Secondary | ICD-10-CM

## 2022-04-27 NOTE — Progress Notes (Signed)
Location:   SNF [place] Room Number: 042 Place of Service:  SNF (31) Provider: [facility] Marissa Weaver NP  Lawerance Cruel, MD  Patient Care Team: Lawerance Cruel, MD as PCP - General (Family Medicine) Minus Breeding, MD as PCP - Cardiology (Cardiology)  Extended Emergency Contact Information Primary Emergency Contact: Wisby,Bill Address: [address]          [place], [place] [address] Johnnette Litter of [place] Phone: [phone] Relation: Spouse Secondary Emergency Contact: Angelia Mould, [place] [address] Johnnette Litter of [place] Phone: [phone] Relation: Daughter  Code Status: DNR Goals of care: Advanced Directive information    04/27/2022    4:04 PM  Advanced Directives  Does Patient Have a Medical Advance Directive? No  Does patient want to make changes to medical advance directive? No - Patient declined     Chief Complaint  Patient presents with   Acute Visit    Nausea w/o vomiting 04/26/22    HPI:  Pt is a 81 y.o. female seen today for an acute visit for reported nausea without vomiting 04/26/22, prn Phenergan was effective.    Hospitalized 04/15/22-04/23/22 left total hip arthroplasty 04/17/22, ASA for DVT prophylaxis, for subcapital fracture of left femur sustained from fall at home, f/u Dr. 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 Jaynee Eagles Neurology, on Donepezil             Obesity             Post op  anemia, Hgb 11.0 04/22/22             Constipation, has MiraLax available to her.   Past Medical History:  Diagnosis Date   Graves disease    HTN (hypertension)    Low back pain    Dr. Ronnald Ramp    Osteoporosis    Pneumonia 2012   Radiation 03/20/14, 03/27/14, 04/05/14, 04/12/14, 04/17/14   bracytherapy to proximal vagina 30 gray   Scoliosis    Uterine cancer ([place])    surgery and radiation x 5 treatments-last 9'15   Vitamin D deficiency    Past Surgical History:  Procedure Laterality Date   BUNIONECTOMY     left   CATARACT EXTRACTION, BILATERAL Bilateral    last done 11-21-14   COLONOSCOPY WITH PROPOFOL N/A 12/25/2014   Procedure: COLONOSCOPY WITH PROPOFOL;  Surgeon: Garlan Fair, MD;  Location: [facility];  Service: Endoscopy;  Laterality: N/A;   FOOT SURGERY Left 2004   Dr Johnnye Sima   JOINT REPLACEMENT     RTKA   ROBOTIC ASSISTED TOTAL HYSTERECTOMY WITH BILATERAL SALPINGO OOPHERECTOMY  01/30/14   with lymph node biopsy   TOTAL HIP ARTHROPLASTY Left 04/17/2022   Procedure: TOTAL HIP ARTHROPLASTY ANTERIOR APPROACH;  Surgeon: Rod Can, MD;  Location: [place];  Service: Orthopedics;  Laterality: Left;  TOTAL KNEE ARTHROPLASTY     right   TOTAL KNEE REVISION Right 01/04/2019   Procedure: TOTAL KNEE REVISION;  Surgeon: Gaynelle Arabian, MD;  Location: [facility];  Service: Orthopedics;  Laterality: Right;  171mn with block    Allergies  Allergen Reactions   Percocet [Oxycodone-Acetaminophen] Nausea And Vomiting    Allergies as of 04/27/2022       Reactions   Percocet [oxycodone-acetaminophen] Nausea And Vomiting        Medication List        Accurate as of April 27, 2022  4:38 PM. If you have any questions, ask your nurse or doctor.          amLODipine 10 MG tablet Commonly known as: NORVASC Take 1 tablet (10 mg total) by mouth daily.   docusate sodium 100 MG capsule Commonly known as: COLACE Take 1 capsule (100 mg total) by mouth 2 (two) times daily.   donepezil 10 MG tablet Commonly known as: ARICEPT Take 1 tablet (10 mg total) by mouth at bedtime.   ibandronate 150 MG tablet Commonly known as: BONIVA Take 150 mg by mouth every 30 (thirty) days.  Pt takes on the 25th of each month   levothyroxine 88 MCG tablet Commonly known as: SYNTHROID Take 88 mcg by mouth daily before breakfast.   polyethylene glycol 17 g packet Commonly known as: MIRALAX / GLYCOLAX Take 17 g by mouth daily as needed for mild constipation.   traMADol 50 MG tablet Commonly known as: Ultram Take 1 tablet (50 mg total) by mouth every 6 (six) hours as needed for up to 7 days for moderate pain or severe pain.   VITAMIN D3 PO Take 2,000 Int'l Units by mouth daily.        Review of Systems  Constitutional:  Negative for activity change, appetite change and fatigue.  HENT:  Negative for congestion and trouble swallowing.   Eyes:  Negative for visual disturbance.  Respiratory:  Negative for cough, shortness of breath and wheezing.   Cardiovascular:  Positive for leg swelling. Negative for chest pain and palpitations.  Gastrointestinal:  Positive for nausea. Negative for abdominal pain, constipation and vomiting.       Nausea x1 04/26/22  Genitourinary:  Negative for dysuria, frequency and urgency.  Musculoskeletal:  Positive for arthralgias, back pain, gait problem and joint swelling.  Skin:  Negative for color change.  Neurological:  Negative for speech difficulty and headaches.       Memory lapses.   Psychiatric/Behavioral:  Negative for confusion and sleep disturbance. The patient is not nervous/anxious.     Immunization History  Administered Date(s) Administered   Influenza Split 05/25/2011, 05/30/2012, 05/17/2013, 06/19/2014   Influenza, High Dose Seasonal PF 05/13/2017, 04/24/2019   Influenza,inj,quad, With Preservative 04/24/2019   PFIZER(Purple Top)SARS-COV-2 Vaccination 09/07/2019, 09/28/2019   Pfizer Covid-19 Vaccine Bivalent Booster 136yr& up 05/11/2020   Pneumococcal Conjugate-13 04/12/2014   Pneumococcal Polysaccharide-23 08/27/2008   Tdap 12/06/2008   Pertinent  Health Maintenance Due  Topic Date Due   INFLUENZA VACCINE  03/03/2022    DEXA SCAN  Completed      04/21/2022    8:11 PM 04/22/2022    7:45 AM 04/22/2022   10:36 PM 04/23/2022    9:21 AM 04/24/2022    8:30 AM  [place] the past year?     1  Patient Fall Risk Level High fall risk High fall risk High fall risk High fall risk High fall risk  Patient at Risk for Falls Due to     History of fall(s)  Fall risk Follow up     Falls evaluation completed   Functional Status Survey:    Vitals:   04/27/22 1216  BP: (!) 147/74  Pulse: 62  Resp: 18  Temp: 97.9 F (36.6 C)  SpO2: 97%   There is no height or weight on file to calculate BMI. Physical Exam Vitals and nursing note reviewed.  Constitutional:      Appearance: Normal appearance. She is well-developed.  HENT:     Head: Normocephalic and atraumatic.     Nose: Nose normal.     Mouth/Throat:     Mouth: Mucous membranes are moist.  Eyes:     Extraocular Movements: Extraocular movements intact.     Conjunctiva/sclera: Conjunctivae normal.     Pupils: Pupils are equal, round, and reactive to light.  Cardiovascular:     Rate and Rhythm: Normal rate and regular rhythm.     Heart sounds: No murmur heard. Pulmonary:     Effort: Pulmonary effort is normal.     Breath sounds: No wheezing or rales.  Abdominal:     General: Bowel sounds are normal.     Palpations: Abdomen is soft.     Tenderness: There is no abdominal tenderness. There is no right CVA tenderness, guarding or rebound.  Musculoskeletal:        General: Normal range of motion.     Cervical back: Normal range of motion and neck supple.     Right lower leg: Edema present.     Left lower leg: Edema present.     Comments: LLE 1+, RLE trace edema.   Skin:    General: Skin is warm and dry.     Comments: Left hip surgical wound is covered in dressing.   Neurological:     General: No focal deficit present.     Mental Status: She is alert and oriented to person, place, and time.     Motor: No weakness.     Gait: Gait abnormal.   Psychiatric:        Mood and Affect: Mood normal.        Behavior: Behavior normal.        Thought Content: Thought content normal.     Labs reviewed: Recent Labs    04/17/22 0554 04/18/22 0049 04/22/22 0728  NA 139 138 137  K 3.8 3.9 3.7  CL 107 104 102  CO2 '24 23 28  '$ GLUCOSE 121* 142* 98  BUN '16 22 11  '$ CREATININE 0.80 0.96 0.73  CALCIUM 8.9 8.5* 8.5*   No results for input(s): "AST", "ALT", "ALKPHOS", "BILITOT", "PROT", "ALBUMIN" in the last 8760 hours. Recent Labs    04/15/22 1614 04/16/22 0022 04/18/22 0049 04/19/22 0011 04/22/22 0728  WBC 10.9*   < > 14.7* 10.4 9.5  NEUTROABS 8.8*  --   --   --   --   HGB 12.7   < > 12.3 11.4* 11.0*  HCT 38.2   < > 36.6 34.5* 32.1*  MCV 95.5   < > 93.8 94.0 92.0  PLT 196   < > 157 142* 227   < > = values in this interval not displayed.   Lab Results  Component Value Date   TSH 0.383 10/31/2014   Lab Results  Component Value Date   HGBA1C 5.5 10/31/2014   Lab Results  Component Value Date   CHOL 147 10/31/2014   HDL  52 10/31/2014   LDLCALC 87 10/31/2014   TRIG 38 10/31/2014   CHOLHDL 2.8 10/31/2014    Significant Diagnostic Results in last 30 days:  DG Pelvis Portable  Result Date: 04/17/2022 CLINICAL DATA:  Status post left hip arthroplasty. EXAM: PORTABLE PELVIS 1-2 VIEWS COMPARISON:  Earlier today FINDINGS: Postop change from left hip arthroplasty identified. The left hip arthroplasty device appears located. No signs of periprosthetic fracture or dislocation. Note: The distal end of the femoral component is excluded from these field of view. IMPRESSION: Status post left hip arthroplasty Electronically Signed   By: Kerby Moors M.D.   On: 04/17/2022 20:05   DG HIP UNILAT WITH PELVIS 1V LEFT  Result Date: 04/17/2022 CLINICAL DATA:  Left femoral neck fracture EXAM: DG HIP (WITH OR WITHOUT PELVIS) 1V*L* COMPARISON:  04/15/2022 FLUOROSCOPY TIME:  Radiation Exposure Index (as provided by the fluoroscopic device):  0.62 mGy If the device does not provide the exposure index: Fluoroscopy Time:  13 seconds Number of Acquired Images:  2 FINDINGS: Left hip prosthesis is noted in satisfactory position. No acute bony abnormality is noted. IMPRESSION: Status post left hip replacement. Electronically Signed   By: Inez Catalina M.D.   On: 04/17/2022 19:07   DG C-Arm 1-60 Min-No Report  Result Date: 04/17/2022 Fluoroscopy was utilized by the requesting physician.  No radiographic interpretation.   DG C-Arm 1-60 Min-No Report  Result Date: 04/17/2022 Fluoroscopy was utilized by the requesting physician.  No radiographic interpretation.   DG Knee Left Port  Result Date: 04/16/2022 CLINICAL DATA:  Fracture left femoral neck. EXAM: PORTABLE LEFT KNEE - 1-2 VIEW COMPARISON:  None Available. FINDINGS: Tiny joint effusion. Mild medial compartment joint space narrowing. No acute fracture is seen. No dislocation. IMPRESSION: Mild medial compartment osteoarthritis. Electronically Signed   By: Yvonne Kendall M.D.   On: 04/16/2022 14:42   DG Chest 1 View  Result Date: 04/15/2022 CLINICAL DATA:  [id number] EXAM: CHEST  1 VIEW COMPARISON:  10/31/2014 FINDINGS: Cardiac enlargement without heart failure. Lungs are clear without infiltrate or effusion. No acute skeletal abnormality. IMPRESSION: Prominent heart size without acute abnormality. Electronically Signed   By: Franchot Gallo M.D.   On: 04/15/2022 16:12   DG Hip Unilat With Pelvis 2-3 Views Left  Result Date: 04/15/2022 CLINICAL DATA:  Fall EXAM: DG HIP (WITH OR WITHOUT PELVIS) 2-3V LEFT COMPARISON:  None Available. FINDINGS: Subcapital femoral neck fracture left with impaction and mild displacement. Left hip joint normal. No other fracture in the pelvis. Lumbar scoliosis and degenerative change. IMPRESSION: Subcapital fracture left femoral neck. Electronically Signed   By: Franchot Gallo M.D.   On: 04/15/2022 16:11    Assessment/Plan: Nausea reported nausea without vomiting  04/26/22, prn Phenergan was effective. Will add Omeprazole for GI protection while taking ASA/Tramadol, observe.   Subcapital fracture of femur, left, closed, initial encounter [facility]) Hospitalized 04/15/22-04/23/22 left total hip arthroplasty 04/17/22, ASA for DVT prophylaxis, for subcapital fracture of left femur sustained from fall at home, f/u Dr. Lyla Glassing 05/08/22, pain is managed with Tramadol  Benign essential HTN Blood pressure is controlled, f/u Cardiology, taking Amlodipine   Osteoporosis  T score -2.8 left femoral neck 05/2020, on Ibandronate, Ca, Vit D  Hypothyroidism  takes Levothyroxine  Mild cognitive impairment followed by Dr. Jaynee Eagles Neurology, on Donepezil  Blood loss anemia Hgb 11.0 04/22/22  Slow transit constipation has MiraLax available to her, last BM today    Family/ staff Communication: plan of care reviewed with the patient and charge  nurse.   Labs/tests ordered:  none  Time spend 25 minutes.

## 2022-04-27 NOTE — Assessment & Plan Note (Signed)
Hgb 11.0 04/22/22

## 2022-04-27 NOTE — Assessment & Plan Note (Addendum)
has MiraLax available to her, last BM today

## 2022-04-27 NOTE — Assessment & Plan Note (Signed)
reported nausea without vomiting 04/26/22, prn Phenergan was effective. Will add Omeprazole for GI protection while taking ASA/Tramadol, observe.

## 2022-04-27 NOTE — Assessment & Plan Note (Signed)
Blood pressure is controlled, f/u Cardiology, taking Amlodipine

## 2022-04-27 NOTE — Assessment & Plan Note (Signed)
followed by Dr. Jaynee Eagles Neurology, on Donepezil

## 2022-04-27 NOTE — Assessment & Plan Note (Signed)
takes Levothyroxine

## 2022-04-27 NOTE — Assessment & Plan Note (Signed)
Hospitalized 04/15/22-04/23/22 left total hip arthroplasty 04/17/22, ASA for DVT prophylaxis, for subcapital fracture of left femur sustained from fall at home, f/u Dr. Lyla Glassing 05/08/22, pain is managed with Tramadol

## 2022-04-27 NOTE — Assessment & Plan Note (Signed)
T score -2.8 left femoral neck 05/2020, on Ibandronate, Ca, Vit D

## 2022-04-28 ENCOUNTER — Non-Acute Institutional Stay (SKILLED_NURSING_FACILITY): Payer: Medicare PPO | Admitting: Family Medicine

## 2022-04-28 DIAGNOSIS — E039 Hypothyroidism, unspecified: Secondary | ICD-10-CM

## 2022-04-28 DIAGNOSIS — G3184 Mild cognitive impairment, so stated: Secondary | ICD-10-CM | POA: Diagnosis not present

## 2022-04-28 DIAGNOSIS — S72002A Fracture of unspecified part of neck of left femur, initial encounter for closed fracture: Secondary | ICD-10-CM | POA: Diagnosis not present

## 2022-04-28 DIAGNOSIS — I10 Essential (primary) hypertension: Secondary | ICD-10-CM | POA: Diagnosis not present

## 2022-04-28 DIAGNOSIS — Z471 Aftercare following joint replacement surgery: Secondary | ICD-10-CM | POA: Diagnosis not present

## 2022-04-28 DIAGNOSIS — N183 Chronic kidney disease, stage 3 unspecified: Secondary | ICD-10-CM

## 2022-04-28 NOTE — Progress Notes (Unsigned)
Location:  [place]    Place of Service: [facility] [place]   Provider:  Man X Mast NP  Lawerance Cruel, MD  Patient Care Team: Lawerance Cruel, MD as PCP - General (Family Medicine) Minus Breeding, MD as PCP - Cardiology (Cardiology)  Extended Emergency Contact Information Primary Emergency Contact: Vanderbeck,Bill Address: [address]          [place], [place] [address] Johnnette Litter of [place] Phone: [phone] Relation: Spouse Secondary Emergency Contact: Angelia Mould, [place] [address] Johnnette Litter of [place] Phone: [phone] Relation: Daughter  Code Status:   Goals of care: Advanced Directive information    04/27/2022    4:04 PM  Advanced Directives  Does Patient Have a Medical Advance Directive? No  Does patient want to make changes to medical advance directive? No - Patient declined     No chief complaint on file.   HPI:  Pt is a 81 y.o. female seen today for an acute visit for    Past Medical History:  Diagnosis Date   Graves disease    HTN (hypertension)    Low back pain    Dr. Ronnald Ramp   Osteoporosis    Pneumonia 2012   Radiation 03/20/14, 03/27/14, 04/05/14, 04/12/14, 04/17/14   bracytherapy to proximal vagina 30 gray   Scoliosis    Uterine cancer ([place])    surgery and radiation x 5 treatments-last 9'15   Vitamin D deficiency    Past Surgical History:  Procedure Laterality Date   BUNIONECTOMY     left   CATARACT EXTRACTION, BILATERAL Bilateral    last done 11-21-14   COLONOSCOPY WITH PROPOFOL N/A 12/25/2014   Procedure: COLONOSCOPY WITH PROPOFOL;  Surgeon: Garlan Fair, MD;  [facility];  Service: Endoscopy;  Laterality: N/A;   FOOT SURGERY Left 2004   Dr Johnnye Sima   JOINT REPLACEMENT     RTKA   ROBOTIC ASSISTED TOTAL HYSTERECTOMY WITH BILATERAL SALPINGO OOPHERECTOMY  01/30/14   with lymph node biopsy   TOTAL HIP ARTHROPLASTY Left 04/17/2022   Procedure: TOTAL HIP ARTHROPLASTY  ANTERIOR APPROACH;  Surgeon: Rod Can, MD;  Location: [facility];  Service: Orthopedics;  Laterality: Left;   TOTAL KNEE ARTHROPLASTY     right   TOTAL KNEE REVISION Right 01/04/2019   Procedure: TOTAL KNEE REVISION;  Surgeon: Gaynelle Arabian, MD;  [facility];  Service: Orthopedics;  Laterality: Right;  159mn with block    Allergies  Allergen Reactions   Percocet [Oxycodone-Acetaminophen] Nausea And Vomiting    Allergies as of 04/27/2022       Reactions   Percocet [oxycodone-acetaminophen] Nausea And Vomiting        Medication List        Accurate as of April 27, 2022 11:59 PM. If you have any questions, ask your nurse or doctor.          amLODipine 10 MG tablet Commonly known as: NORVASC Take 1 tablet (10 mg total) by mouth daily.   docusate sodium 100 MG capsule Commonly known as: COLACE Take 1 capsule (100 mg total) by mouth 2 (two) times daily.   donepezil 10 MG tablet Commonly known as: ARICEPT Take 1 tablet (10 mg total) by mouth at bedtime.   ibandronate 150 MG tablet Commonly known as: BONIVA Take 150 mg by mouth every 30 (thirty) days. Pt takes on the 25th of each month   levothyroxine 88 MCG tablet  Commonly known as: SYNTHROID Take 88 mcg by mouth daily before breakfast.   polyethylene glycol 17 g packet Commonly known as: MIRALAX / GLYCOLAX Take 17 g by mouth daily as needed for mild constipation.   traMADol 50 MG tablet Commonly known as: Ultram Take 1 tablet (50 mg total) by mouth every 6 (six) hours as needed for up to 7 days for moderate pain or severe pain.   VITAMIN D3 PO Take 2,000 Int'l Units by mouth daily.        Review of Systems  Immunization History  Administered Date(s) Administered   Influenza Split 05/25/2011, 05/30/2012, 05/17/2013, 06/19/2014   Influenza, High Dose Seasonal PF 05/13/2017, 04/24/2019   Influenza,inj,quad, With Preservative 04/24/2019   PFIZER(Purple Top)SARS-COV-2 Vaccination 09/07/2019,  09/28/2019   Pfizer Covid-19 Vaccine Bivalent Booster 75yr & up 05/11/2020   Pneumococcal Conjugate-13 04/12/2014   Pneumococcal Polysaccharide-23 08/27/2008   Tdap 12/06/2008   Pertinent  Health Maintenance Due  Topic Date Due   INFLUENZA VACCINE  03/03/2022   DEXA SCAN  Completed      04/21/2022    8:11 PM 04/22/2022    7:45 AM 04/22/2022   10:36 PM 04/23/2022    9:21 AM 04/24/2022    8:30 AM  [place] the past year?     1  Patient Fall Risk Level High fall risk High fall risk High fall risk High fall risk High fall risk  Patient at Risk for Falls Due to     History of fall(s)  Fall risk Follow up     Falls evaluation completed   Functional Status Survey:    There were no vitals filed for this visit. There is no height or weight on file to calculate BMI. Physical Exam  Labs reviewed: Recent Labs    04/17/22 0554 04/18/22 0049 04/22/22 0728  NA 139 138 137  K 3.8 3.9 3.7  CL 107 104 102  CO2 '24 23 28  '$ GLUCOSE 121* 142* 98  BUN '16 22 11  '$ CREATININE 0.80 0.96 0.73  CALCIUM 8.9 8.5* 8.5*   No results for input(s): "AST", "ALT", "ALKPHOS", "BILITOT", "PROT", "ALBUMIN" in the last 8760 hours. Recent Labs    04/15/22 1614 04/16/22 0022 04/18/22 0049 04/19/22 0011 04/22/22 0728  WBC 10.9*   < > 14.7* 10.4 9.5  NEUTROABS 8.8*  --   --   --   --   HGB 12.7   < > 12.3 11.4* 11.0*  HCT 38.2   < > 36.6 34.5* 32.1*  MCV 95.5   < > 93.8 94.0 92.0  PLT 196   < > 157 142* 227   < > = values in this interval not displayed.   Lab Results  Component Value Date   TSH 0.383 10/31/2014   Lab Results  Component Value Date   HGBA1C 5.5 10/31/2014   Lab Results  Component Value Date   CHOL 147 10/31/2014   HDL 52 10/31/2014   LDLCALC 87 10/31/2014   TRIG 38 10/31/2014   CHOLHDL 2.8 10/31/2014    Significant Diagnostic Results in last 30 days:  DG Pelvis Portable  Result Date: 04/17/2022 CLINICAL DATA:  Status post left hip arthroplasty. EXAM: PORTABLE  PELVIS 1-2 VIEWS COMPARISON:  Earlier today FINDINGS: Postop change from left hip arthroplasty identified. The left hip arthroplasty device appears located. No signs of periprosthetic fracture or dislocation. Note: The distal end of the femoral component is excluded from these field of view. IMPRESSION: Status post  left hip arthroplasty Electronically Signed   By: Kerby Moors M.D.   On: 04/17/2022 20:05   DG HIP UNILAT WITH PELVIS 1V LEFT  Result Date: 04/17/2022 CLINICAL DATA:  Left femoral neck fracture EXAM: DG HIP (WITH OR WITHOUT PELVIS) 1V*L* COMPARISON:  04/15/2022 FLUOROSCOPY TIME:  Radiation Exposure Index (as provided by the fluoroscopic device): 0.62 mGy If the device does not provide the exposure index: Fluoroscopy Time:  13 seconds Number of Acquired Images:  2 FINDINGS: Left hip prosthesis is noted in satisfactory position. No acute bony abnormality is noted. IMPRESSION: Status post left hip replacement. Electronically Signed   By: Inez Catalina M.D.   On: 04/17/2022 19:07   DG C-Arm 1-60 Min-No Report  Result Date: 04/17/2022 Fluoroscopy was utilized by the requesting physician.  No radiographic interpretation.   DG C-Arm 1-60 Min-No Report  Result Date: 04/17/2022 Fluoroscopy was utilized by the requesting physician.  No radiographic interpretation.   DG Knee Left Port  Result Date: 04/16/2022 CLINICAL DATA:  Fracture left femoral neck. EXAM: PORTABLE LEFT KNEE - 1-2 VIEW COMPARISON:  None Available. FINDINGS: Tiny joint effusion. Mild medial compartment joint space narrowing. No acute fracture is seen. No dislocation. IMPRESSION: Mild medial compartment osteoarthritis. Electronically Signed   By: Yvonne Kendall M.D.   On: 04/16/2022 14:42   DG Chest 1 View  Result Date: 04/15/2022 CLINICAL DATA:  [id number] EXAM: CHEST  1 VIEW COMPARISON:  10/31/2014 FINDINGS: Cardiac enlargement without heart failure. Lungs are clear without infiltrate or effusion. No acute skeletal abnormality.  IMPRESSION: Prominent heart size without acute abnormality. Electronically Signed   By: Franchot Gallo M.D.   On: 04/15/2022 16:12   DG Hip Unilat With Pelvis 2-3 Views Left  Result Date: 04/15/2022 CLINICAL DATA:  Fall EXAM: DG HIP (WITH OR WITHOUT PELVIS) 2-3V LEFT COMPARISON:  None Available. FINDINGS: Subcapital femoral neck fracture left with impaction and mild displacement. Left hip joint normal. No other fracture in the pelvis. Lumbar scoliosis and degenerative change. IMPRESSION: Subcapital fracture left femoral neck. Electronically Signed   By: Franchot Gallo M.D.   On: 04/15/2022 16:11    Assessment/Plan There are no diagnoses linked to this encounter.   Family/ staff Communication:   Labs/tests ordered:

## 2022-04-28 NOTE — Progress Notes (Deleted)
.  smm 

## 2022-04-28 NOTE — Progress Notes (Signed)
Provider:  Alain Honey, MD Location:      Place of Service:     PCP: Lawerance Cruel, MD Patient Care Team: Lawerance Cruel, MD as PCP - General (Family Medicine) Minus Breeding, MD as PCP - Cardiology (Cardiology)  Extended Emergency Contact Information Primary Emergency Contact: Ruddick,Bill Address: [address]          [place], [place] [address] Johnnette Litter of [place] Phone: [phone] Relation: Spouse Secondary Emergency Contact: Angelia Mould, [place] [address] Johnnette Litter of [place] Phone: [phone] Relation: Daughter  Code Status:  Goals of Care: Advanced Directive information    04/27/2022    4:04 PM  Advanced Directives  Does Patient Have a Medical Advance Directive? No  Does patient want to make changes to medical advance directive? No - Patient declined      No chief complaint on file.   HPI: Patient is a 81 y.o. female seen today for admission to [facility].  She was hospitalized from September 13 to September 21 for a subcapital fracture of the left femur that she suffered from a fall.  She underwent left total hip arthroplasty on September 15.  She is admitted here for therapy with plan to return home to her husband after goals are reached in therapy. Chronic problems include osteoporosis, hypothyroidism, chronic kidney disease, mild cognitive impairment followed by neurology, and obesity.   Past Medical History:  Diagnosis Date   Graves disease    HTN (hypertension)    Low back pain    Dr. Ronnald Ramp   Osteoporosis    Pneumonia 2012   Radiation 03/20/14, 03/27/14, 04/05/14, 04/12/14, 04/17/14   bracytherapy to proximal vagina 30 gray   Scoliosis    Uterine cancer ([place])    surgery and radiation x 5 treatments-last 9'15   Vitamin D deficiency    Past Surgical History:  Procedure Laterality Date   BUNIONECTOMY     left   CATARACT EXTRACTION, BILATERAL Bilateral    last done 11-21-14   COLONOSCOPY WITH  PROPOFOL N/A 12/25/2014   Procedure: COLONOSCOPY WITH PROPOFOL;  Surgeon: Garlan Fair, MD;  Location: [facility];  Service: Endoscopy;  Laterality: N/A;   FOOT SURGERY Left 2004   Dr Johnnye Sima   JOINT REPLACEMENT     RTKA   ROBOTIC ASSISTED TOTAL HYSTERECTOMY WITH BILATERAL SALPINGO OOPHERECTOMY  01/30/14   with lymph node biopsy   TOTAL HIP ARTHROPLASTY Left 04/17/2022   Procedure: TOTAL HIP ARTHROPLASTY ANTERIOR APPROACH;  Surgeon: Rod Can, MD;  Location: [facility];  Service: Orthopedics;  Laterality: Left;   TOTAL KNEE ARTHROPLASTY     right   TOTAL KNEE REVISION Right 01/04/2019   Procedure: TOTAL KNEE REVISION;  Surgeon: Gaynelle Arabian, MD;  Location: [facility];  Service: Orthopedics;  Laterality: Right;  165mn with block    reports that she has never smoked. She has never used smokeless tobacco. She reports that she does not drink alcohol and does not use drugs. Social History   Socioeconomic History   Marital status: Married    Spouse name: Not on file   Number of children: 3   Years of education: Not on file   Highest education level: High school graduate  Occupational History   Occupation: administrative assitant  Tobacco Use   Smoking status: Never   Smokeless tobacco: Never  Vaping Use   Vaping Use: Never used  Substance and Sexual Activity   Alcohol  use: Never   Drug use: Never   Sexual activity: Not Currently  Other Topics Concern   Not on file  Social History Narrative   Lives at home with husband, Rush [place]mark.  Four grand and five great grands.     Caffeine: diet coke about 1-2 per day    Right handed   Social Determinants of Health   Financial Resource Strain: Not on file  Food Insecurity: No Food Insecurity (04/22/2022)   Hunger Vital Sign    Worried About Running Out of Food in the Last Year: Never true    Ran Out of Food in the Last Year: Never true  Transportation Needs: No Transportation Needs (04/22/2022)   PRAPARE - Radiographer, therapeutic (Medical): No    Lack of Transportation (Non-Medical): No  Physical Activity: Not on file  Stress: Not on file  Social Connections: Not on file  Intimate Partner Violence: Not At Risk (04/22/2022)   Humiliation, Afraid, Rape, and Kick questionnaire    Fear of Current or Ex-Partner: No    Emotionally Abused: No    Physically Abused: No    Sexually Abused: No    Functional Status Survey:    Family History  Adopted: Yes  Problem Relation Age of Onset   Heart disease Mother 30       No details   Memory loss Neg Hx    Dementia Neg Hx     Health Maintenance  Topic Date Due   Zoster Vaccines- Shingrix (1 of 2) Never done   TETANUS/TDAP  12/07/2018   COVID-19 Vaccine (4 - Pfizer risk series) 07/06/2020   INFLUENZA VACCINE  03/03/2022   Pneumonia Vaccine 45+ Years old  Completed   DEXA SCAN  Completed   HPV VACCINES  Aged Out    Allergies  Allergen Reactions   Percocet [Oxycodone-Acetaminophen] Nausea And Vomiting    Outpatient Encounter Medications as of 04/28/2022  Medication Sig   amLODipine (NORVASC) 10 MG tablet Take 1 tablet (10 mg total) by mouth daily.   Cholecalciferol (VITAMIN D3 PO) Take 2,000 Int'l Units by mouth daily.   docusate sodium (COLACE) 100 MG capsule Take 1 capsule (100 mg total) by mouth 2 (two) times daily.   donepezil (ARICEPT) 10 MG tablet Take 1 tablet (10 mg total) by mouth at bedtime.   ibandronate (BONIVA) 150 MG tablet Take 150 mg by mouth every 30 (thirty) days. Pt takes on the 25th of each month   levothyroxine (SYNTHROID) 88 MCG tablet Take 88 mcg by mouth daily before breakfast.    polyethylene glycol (MIRALAX / GLYCOLAX) 17 g packet Take 17 g by mouth daily as needed for mild constipation.   traMADol (ULTRAM) 50 MG tablet Take 1 tablet (50 mg total) by mouth every 6 (six) hours as needed for up to 7 days for moderate pain or severe pain.   No facility-administered encounter medications on file as of 04/28/2022.    Review  of Systems  Constitutional: Negative.   HENT: Negative.    Respiratory: Negative.    Cardiovascular: Negative.   Musculoskeletal:  Positive for gait problem.       Secondary to hip fracture  Skin: Negative.   Psychiatric/Behavioral:  Positive for decreased concentration.   All other systems reviewed and are negative.   There were no vitals filed for this visit. There is no height or weight on file to calculate BMI. Physical Exam Vitals and nursing note reviewed.  Constitutional:  Appearance: She is obese.  HENT:     Head: Normocephalic.     Nose: Nose normal.  Cardiovascular:     Rate and Rhythm: Normal rate and regular rhythm.  Pulmonary:     Effort: Pulmonary effort is normal.     Breath sounds: Normal breath sounds.  Abdominal:     General: Abdomen is flat. Bowel sounds are normal.     Palpations: Abdomen is soft.  Musculoskeletal:        General: Tenderness present.     Cervical back: Normal range of motion.     Comments: Status post left hip arthroplasty  Skin:    General: Skin is warm and dry.  Neurological:     General: No focal deficit present.     Mental Status: She is alert and oriented to person, place, and time.  Psychiatric:        Mood and Affect: Mood normal.        Behavior: Behavior normal.     Labs reviewed: Basic Metabolic Panel: Recent Labs    04/17/22 0554 04/18/22 0049 04/22/22 0728  NA 139 138 137  K 3.8 3.9 3.7  CL 107 104 102  CO2 '24 23 28  '$ GLUCOSE 121* 142* 98  BUN '16 22 11  '$ CREATININE 0.80 0.96 0.73  CALCIUM 8.9 8.5* 8.5*   Liver Function Tests: No results for input(s): "AST", "ALT", "ALKPHOS", "BILITOT", "PROT", "ALBUMIN" in the last 8760 hours. No results for input(s): "LIPASE", "AMYLASE" in the last 8760 hours. No results for input(s): "AMMONIA" in the last 8760 hours. CBC: Recent Labs    04/15/22 1614 04/16/22 0022 04/18/22 0049 04/19/22 0011 04/22/22 0728  WBC 10.9*   < > 14.7* 10.4 9.5  NEUTROABS 8.8*  --    --   --   --   HGB 12.7   < > 12.3 11.4* 11.0*  HCT 38.2   < > 36.6 34.5* 32.1*  MCV 95.5   < > 93.8 94.0 92.0  PLT 196   < > 157 142* 227   < > = values in this interval not displayed.   Cardiac Enzymes: Recent Labs    04/15/22 1716  CKTOTAL 51   BNP: Invalid input(s): "POCBNP" Lab Results  Component Value Date   HGBA1C 5.5 10/31/2014   Lab Results  Component Value Date   TSH 0.383 10/31/2014   No results found for: "VITAMINB12" No results found for: "FOLATE" No results found for: "IRON", "TIBC", "FERRITIN"  Imaging and Procedures obtained prior to SNF admission: DG Knee Left Port  Result Date: 04/16/2022 CLINICAL DATA:  Fracture left femoral neck. EXAM: PORTABLE LEFT KNEE - 1-2 VIEW COMPARISON:  None Available. FINDINGS: Tiny joint effusion. Mild medial compartment joint space narrowing. No acute fracture is seen. No dislocation. IMPRESSION: Mild medial compartment osteoarthritis. Electronically Signed   By: Yvonne Kendall M.D.   On: 04/16/2022 14:42   DG Chest 1 View  Result Date: 04/15/2022 CLINICAL DATA:  [id number] EXAM: CHEST  1 VIEW COMPARISON:  10/31/2014 FINDINGS: Cardiac enlargement without heart failure. Lungs are clear without infiltrate or effusion. No acute skeletal abnormality. IMPRESSION: Prominent heart size without acute abnormality. Electronically Signed   By: Franchot Gallo M.D.   On: 04/15/2022 16:12   DG Hip Unilat With Pelvis 2-3 Views Left  Result Date: 04/15/2022 CLINICAL DATA:  Fall EXAM: DG HIP (WITH OR WITHOUT PELVIS) 2-3V LEFT COMPARISON:  None Available. FINDINGS: Subcapital femoral neck fracture left with impaction and mild displacement. Left  hip joint normal. No other fracture in the pelvis. Lumbar scoliosis and degenerative change. IMPRESSION: Subcapital fracture left femoral neck. Electronically Signed   By: Franchot Gallo M.D.   On: 04/15/2022 16:11    Assessment/Plan 1. Benign essential HTN Blood pressure has been elevated since her fall and  fractured hip.  Currently taking amlodipine 10 mg.  Will follow  2. Stage 3 chronic kidney disease, unspecified whether stage 3a or 3b CKD ([place]) Most recent renal studies show a creatinine of 0.73 with a BUN of 11.  Previously creatinine had been elevated  3. Acquired hypothyroidism She takes levothyroxine 88 mcg.  That is followed by her PCP  4. Mild cognitive impairment She demonstrates good memory on her encounter today.  She is followed by neurology and takes donepezil    Family/ staff Communication:   Labs/tests ordered:  Lillette Boxer. Sabra Heck, [place] [address] [place], [place] Office [phone]

## 2022-04-30 NOTE — Progress Notes (Signed)
This encounter was created in error - please disregard.

## 2022-05-03 DIAGNOSIS — F0390 Unspecified dementia without behavioral disturbance: Secondary | ICD-10-CM | POA: Diagnosis not present

## 2022-05-03 DIAGNOSIS — E039 Hypothyroidism, unspecified: Secondary | ICD-10-CM | POA: Diagnosis not present

## 2022-05-03 DIAGNOSIS — D500 Iron deficiency anemia secondary to blood loss (chronic): Secondary | ICD-10-CM | POA: Diagnosis not present

## 2022-05-03 DIAGNOSIS — G3184 Mild cognitive impairment, so stated: Secondary | ICD-10-CM | POA: Diagnosis not present

## 2022-05-03 DIAGNOSIS — I10 Essential (primary) hypertension: Secondary | ICD-10-CM | POA: Diagnosis not present

## 2022-05-03 DIAGNOSIS — S72012S Unspecified intracapsular fracture of left femur, sequela: Secondary | ICD-10-CM | POA: Diagnosis not present

## 2022-05-03 DIAGNOSIS — M8000XA Age-related osteoporosis with current pathological fracture, unspecified site, initial encounter for fracture: Secondary | ICD-10-CM | POA: Diagnosis not present

## 2022-05-03 DIAGNOSIS — K5901 Slow transit constipation: Secondary | ICD-10-CM | POA: Diagnosis not present

## 2022-05-03 DIAGNOSIS — W19XXXD Unspecified fall, subsequent encounter: Secondary | ICD-10-CM | POA: Diagnosis not present

## 2022-05-03 DIAGNOSIS — S72012D Unspecified intracapsular fracture of left femur, subsequent encounter for closed fracture with routine healing: Secondary | ICD-10-CM | POA: Diagnosis not present

## 2022-05-03 DIAGNOSIS — M25552 Pain in left hip: Secondary | ICD-10-CM | POA: Diagnosis not present

## 2022-05-03 DIAGNOSIS — D72829 Elevated white blood cell count, unspecified: Secondary | ICD-10-CM | POA: Diagnosis not present

## 2022-05-03 DIAGNOSIS — K5900 Constipation, unspecified: Secondary | ICD-10-CM | POA: Diagnosis not present

## 2022-05-03 DIAGNOSIS — I160 Hypertensive urgency: Secondary | ICD-10-CM | POA: Diagnosis not present

## 2022-05-03 DIAGNOSIS — N183 Chronic kidney disease, stage 3 unspecified: Secondary | ICD-10-CM | POA: Diagnosis not present

## 2022-05-13 ENCOUNTER — Encounter: Payer: Self-pay | Admitting: Adult Health

## 2022-05-13 ENCOUNTER — Non-Acute Institutional Stay (SKILLED_NURSING_FACILITY): Payer: Medicare PPO | Admitting: Adult Health

## 2022-05-13 DIAGNOSIS — G3184 Mild cognitive impairment, so stated: Secondary | ICD-10-CM | POA: Diagnosis not present

## 2022-05-13 DIAGNOSIS — S72012S Unspecified intracapsular fracture of left femur, sequela: Secondary | ICD-10-CM

## 2022-05-13 DIAGNOSIS — I10 Essential (primary) hypertension: Secondary | ICD-10-CM

## 2022-05-13 DIAGNOSIS — M25552 Pain in left hip: Secondary | ICD-10-CM

## 2022-05-13 MED ORDER — TRAMADOL HCL 50 MG PO TABS: 50.0000 mg | ORAL_TABLET | Freq: Two times a day (BID) | ORAL | 0 refills | Status: DC | PRN

## 2022-05-13 NOTE — Progress Notes (Deleted)
Location:  [facility] Room Number: 42/A Place of Service:  SNF [phone]) Provider:  Durenda Age NP  Lawerance Cruel, MD  Patient Care Team: Lawerance Cruel, MD as PCP - General (Family Medicine) Minus Breeding, MD as PCP - Cardiology (Cardiology)  Extended Emergency Contact Information Primary Emergency Contact: Giles,Bill Address: [address]          [place], [place] [address] Johnnette Litter of [place] Phone: [phone] Relation: Spouse Secondary Emergency Contact: Angelia Mould, [place] [address] Johnnette Litter of [place] Phone: [phone] Relation: Daughter  Code Status: Full Code Managed Care  Advanced Directives Does Patient Have a Medical Advance Directive? No Does patient want to make changes to medical advance directive? No - Patient declined  05/13/2022   2:37PM   Advanced Directives Does Patient Have a Medical Advance Directive? No Does patient want to make changes to medical advance directive? No - Patient declined    Chief Complaint  Patient presents with   Acute Visit    Pain management    HPI:  Pt is a 81 y.o. female seen today for an acute visit for    Past Medical History:  Diagnosis Date   Graves disease    HTN (hypertension)    Low back pain    Dr. Ronnald Ramp   Osteoporosis    Pneumonia 2012   Radiation 03/20/14, 03/27/14, 04/05/14, 04/12/14, 04/17/14   bracytherapy to proximal vagina 30 gray   Scoliosis    Uterine cancer ([facility])    surgery and radiation x 5 treatments-last 9'15   Vitamin D deficiency    Past Surgical History:  Procedure Laterality Date   BUNIONECTOMY     left   CATARACT EXTRACTION, BILATERAL Bilateral    last done 11-21-14   COLONOSCOPY WITH PROPOFOL N/A 12/25/2014   Procedure: COLONOSCOPY WITH PROPOFOL;  Surgeon: Garlan Fair, MD;  [facility];  Service: Endoscopy;  Laterality: N/A;   FOOT SURGERY Left 2004   Dr Johnnye Sima    JOINT REPLACEMENT     RTKA   ROBOTIC ASSISTED TOTAL HYSTERECTOMY WITH BILATERAL SALPINGO OOPHERECTOMY  01/30/14   with lymph node biopsy   TOTAL HIP ARTHROPLASTY Left 04/17/2022   Procedure: TOTAL HIP ARTHROPLASTY ANTERIOR APPROACH;  Surgeon: Rod Can, MD;  Location: [facility];  Service: Orthopedics;  Laterality: Left;   TOTAL KNEE ARTHROPLASTY     right   TOTAL KNEE REVISION Right 01/04/2019   Procedure: TOTAL KNEE REVISION;  Surgeon: Gaynelle Arabian, MD;  [facility];  Service: Orthopedics;  Laterality: Right;  166mn with block    Allergies  Allergen Reactions   Percocet [Oxycodone-Acetaminophen] Nausea And Vomiting    Allergies as of 05/13/2022       Reactions   Percocet [oxycodone-acetaminophen] Nausea And Vomiting        Medication List        Accurate as of May 13, 2022  2:33 PM. If you have any questions, ask your nurse or doctor.          amLODipine 10 MG tablet Commonly known as: NORVASC Take 1 tablet (10 mg total) by mouth daily.   docusate sodium 100 MG capsule Commonly known as: COLACE Take 1 capsule (100 mg total) by mouth 2 (two) times daily.   donepezil 10 MG tablet Commonly known as: ARICEPT Take 1 tablet (10 mg total) by mouth at bedtime.   ibandronate 150  MG tablet Commonly known as: BONIVA Take 150 mg by mouth every 30 (thirty) days. Pt takes on the 25th of each month   levothyroxine 88 MCG tablet Commonly known as: SYNTHROID Take 88 mcg by mouth daily before breakfast.   polyethylene glycol 17 g packet Commonly known as: MIRALAX / GLYCOLAX Take 17 g by mouth daily as needed for mild constipation.   VITAMIN D3 PO Take 2,000 Int'l Units by mouth daily.        Review of Systems  Immunization History  Administered Date(s) Administered   Influenza Split 05/25/2011, 05/30/2012, 05/17/2013, 06/19/2014   Influenza, High Dose Seasonal PF 05/13/2017, 04/24/2019   Influenza,inj,quad, With Preservative 04/24/2019    PFIZER(Purple Top)SARS-COV-2 Vaccination 09/07/2019, 09/28/2019   Pfizer Covid-19 Vaccine Bivalent Booster 59yr & up 05/11/2020   Pneumococcal Conjugate-13 04/12/2014   Pneumococcal Polysaccharide-23 08/27/2008   Tdap 12/06/2008   Pertinent  Health Maintenance Due  Topic Date Due   INFLUENZA VACCINE  03/03/2022   DEXA SCAN  Completed      04/21/2022    8:11 PM 04/22/2022    7:45 AM 04/22/2022   10:36 PM 04/23/2022    9:21 AM 04/24/2022    8:30 AM  [place]?     1  Patient Fall Risk Level High fall risk High fall risk High fall risk High fall risk High fall risk  Patient at Risk for Falls Due to     History of fall(s)  Fall risk Follow up     Falls evaluation completed   Functional Status Survey:    Vitals:   05/13/22 1415  BP: (!) 162/74  Pulse: 66  Resp: 18  Temp: (!) 97.4 F (36.3 C)  SpO2: 96%  Weight: 175 lb (79.4 kg)  Height: '5\' 4"'$  (1.626 m)   Body mass index is 30.04 kg/m. Physical Exam  Labs reviewed: Recent Labs    04/17/22 0554 04/18/22 0049 04/22/22 0728  NA 139 138 137  K 3.8 3.9 3.7  CL 107 104 102  CO2 '24 23 28  '$ GLUCOSE 121* 142* 98  BUN '16 22 11  '$ CREATININE 0.80 0.96 0.73  CALCIUM 8.9 8.5* 8.5*   No results for input(s): "AST", "ALT", "ALKPHOS", "BILITOT", "PROT", "ALBUMIN" in the last 8760 hours. Recent Labs    04/15/22 1614 04/16/22 0022 04/18/22 0049 04/19/22 0011 04/22/22 0728  WBC 10.9*   < > 14.7* 10.4 9.5  NEUTROABS 8.8*  --   --   --   --   HGB 12.7   < > 12.3 11.4* 11.0*  HCT 38.2   < > 36.6 34.5* 32.1*  MCV 95.5   < > 93.8 94.0 92.0  PLT 196   < > 157 142* 227   < > = values in this interval not displayed.   Lab Results  Component Value Date   TSH 0.383 10/31/2014   Lab Results  Component Value Date   HGBA1C 5.5 10/31/2014   Lab Results  Component Value Date   CHOL 147 10/31/2014   HDL 52 10/31/2014   LDLCALC 87 10/31/2014   TRIG 38 10/31/2014   CHOLHDL 2.8 10/31/2014    Significant  Diagnostic Results in last 30 days:  DG Pelvis Portable  Result Date: 04/17/2022 CLINICAL DATA:  Status post left hip arthroplasty. EXAM: PORTABLE PELVIS 1-2 VIEWS COMPARISON:  Earlier today FINDINGS: Postop change from left hip arthroplasty identified. The left hip arthroplasty device appears located. No signs of periprosthetic fracture  or dislocation. Note: The distal end of the femoral component is excluded from these field of view. IMPRESSION: Status post left hip arthroplasty Electronically Signed   By: Kerby Moors M.D.   On: 04/17/2022 20:05   DG HIP UNILAT WITH PELVIS 1V LEFT  Result Date: 04/17/2022 CLINICAL DATA:  Left femoral neck fracture EXAM: DG HIP (WITH OR WITHOUT PELVIS) 1V*L* COMPARISON:  04/15/2022 FLUOROSCOPY TIME:  Radiation Exposure Index (as provided by the fluoroscopic device): 0.62 mGy If the device does not provide the exposure index: Fluoroscopy Time:  13 seconds Number of Acquired Images:  2 FINDINGS: Left hip prosthesis is noted in satisfactory position. No acute bony abnormality is noted. IMPRESSION: Status post left hip replacement. Electronically Signed   By: Inez Catalina M.D.   On: 04/17/2022 19:07   DG C-Arm 1-60 Min-No Report  Result Date: 04/17/2022 Fluoroscopy was utilized by the requesting physician.  No radiographic interpretation.   DG C-Arm 1-60 Min-No Report  Result Date: 04/17/2022 Fluoroscopy was utilized by the requesting physician.  No radiographic interpretation.   DG Knee Left Port  Result Date: 04/16/2022 CLINICAL DATA:  Fracture left femoral neck. EXAM: PORTABLE LEFT KNEE - 1-2 VIEW COMPARISON:  None Available. FINDINGS: Tiny joint effusion. Mild medial compartment joint space narrowing. No acute fracture is seen. No dislocation. IMPRESSION: Mild medial compartment osteoarthritis. Electronically Signed   By: Yvonne Kendall M.D.   On: 04/16/2022 14:42   DG Chest 1 View  Result Date: 04/15/2022 CLINICAL DATA:  [id number] EXAM: CHEST  1 VIEW  COMPARISON:  10/31/2014 FINDINGS: Cardiac enlargement without heart failure. Lungs are clear without infiltrate or effusion. No acute skeletal abnormality. IMPRESSION: Prominent heart size without acute abnormality. Electronically Signed   By: Franchot Gallo M.D.   On: 04/15/2022 16:12   DG Hip Unilat With Pelvis 2-3 Views Left  Result Date: 04/15/2022 CLINICAL DATA:  Fall EXAM: DG HIP (WITH OR WITHOUT PELVIS) 2-3V LEFT COMPARISON:  None Available. FINDINGS: Subcapital femoral neck fracture left with impaction and mild displacement. Left hip joint normal. No other fracture in the pelvis. Lumbar scoliosis and degenerative change. IMPRESSION: Subcapital fracture left femoral neck. Electronically Signed   By: Franchot Gallo M.D.   On: 04/15/2022 16:11    Assessment/Plan There are no diagnoses linked to this encounter.   Family/ staff Communication:   Labs/tests ordered:

## 2022-05-13 NOTE — Progress Notes (Signed)
Location:  [facility] Room Number: 42/A Place of Service:  SNF (31) Provider:  Durenda Age, DNP, FNP-BC  Patient Care Team: Lawerance Cruel, MD as PCP - General (Family Medicine) Minus Breeding, MD as PCP - Cardiology (Cardiology)  Extended Emergency Contact Information Primary Emergency Contact: Bracco,Bill Address: [address]          [place], [place] [address] Johnnette Litter of [place] Phone: [phone] Relation: Spouse Secondary Emergency Contact: Angelia Mould, [place] [address] Johnnette Litter of [place] Phone: [phone] Relation: Daughter  Code Status:   Full Code  Goals of care: Advanced Directive information    05/13/2022    2:20 PM  Advanced Directives  Does Patient Have a Medical Advance Directive? No  Does patient want to make changes to medical advance directive? No - Patient declined     Chief Complaint  Patient presents with   Acute Visit    Pain management    HPI:  Pt is a 81 y.o. female seen today for an acute visit regarding pain management. She was admitted to [facility] on 04/23/22 post hospitalization 04/15/22 to 04/23/22 for due to a fall at home sustaining subcapital fracture of left femoral neck for which she had left total hip arthroplasty on 04/17/2022.  She has been admitted to [facility] for short-term rehabilitation. She complained today of pain on her left hip, 7/10. Left femur surgical wound is dry, no erythema.  BP 135/62, takes Norvasc for hypertension.  She takes Donepezil for mild cognitive impairment.    Past Medical History:  Diagnosis Date   Graves disease    HTN (hypertension)    Low back pain    Dr. Ronnald Ramp   Osteoporosis    Pneumonia 2012   Radiation 03/20/14, 03/27/14, 04/05/14, 04/12/14, 04/17/14   bracytherapy to proximal vagina 30 gray   Scoliosis    Uterine cancer ([place])    surgery and radiation x 5 treatments-last 9'15   Vitamin D deficiency    Past  Surgical History:  Procedure Laterality Date   BUNIONECTOMY     left   CATARACT EXTRACTION, BILATERAL Bilateral    last done 11-21-14   COLONOSCOPY WITH PROPOFOL N/A 12/25/2014   Procedure: COLONOSCOPY WITH PROPOFOL;  Surgeon: Garlan Fair, MD;  Location: [facility];  Service: Endoscopy;  Laterality: N/A;   FOOT SURGERY Left 2004   Dr Johnnye Sima   JOINT REPLACEMENT     RTKA   ROBOTIC ASSISTED TOTAL HYSTERECTOMY WITH BILATERAL SALPINGO OOPHERECTOMY  01/30/14   with lymph node biopsy   TOTAL HIP ARTHROPLASTY Left 04/17/2022   Procedure: TOTAL HIP ARTHROPLASTY ANTERIOR APPROACH;  Surgeon: Rod Can, MD;  Location: [facility];  Service: Orthopedics;  Laterality: Left;   TOTAL KNEE ARTHROPLASTY     right   TOTAL KNEE REVISION Right 01/04/2019   Procedure: TOTAL KNEE REVISION;  Surgeon: Gaynelle Arabian, MD;  Location: [facility];  Service: Orthopedics;  Laterality: Right;  169mn with block    Allergies  Allergen Reactions   Percocet [Oxycodone-Acetaminophen] Nausea And Vomiting    Outpatient Encounter Medications as of 05/13/2022  Medication Sig   amLODipine (NORVASC) 10 MG tablet Take 1 tablet (10 mg total) by mouth daily.   Cholecalciferol (VITAMIN D3 PO) Take 2,000 Int'l Units by mouth daily.   docusate sodium (COLACE) 100 MG capsule Take 1 capsule (100 mg total) by mouth 2 (two) times daily.   donepezil (ARICEPT)  10 MG tablet Take 1 tablet (10 mg total) by mouth at bedtime.   ibandronate (BONIVA) 150 MG tablet Take 150 mg by mouth every 30 (thirty) days. Pt takes on the 25th of each month   levothyroxine (SYNTHROID) 88 MCG tablet Take 88 mcg by mouth daily before breakfast.    polyethylene glycol (MIRALAX / GLYCOLAX) 17 g packet Take 17 g by mouth daily as needed for mild constipation.   No facility-administered encounter medications on file as of 05/13/2022.    Review of Systems  Constitutional:  Negative for appetite change, chills, fatigue and fever.  HENT:  Negative for  congestion, hearing loss, rhinorrhea and sore throat.   Eyes: Negative.   Respiratory:  Negative for cough, shortness of breath and wheezing.   Cardiovascular:  Negative for chest pain, palpitations and leg swelling.  Gastrointestinal:  Negative for abdominal pain, constipation, diarrhea, nausea and vomiting.  Genitourinary:  Negative for dysuria.  Musculoskeletal:  Negative for arthralgias, back pain and myalgias.  Skin:  Negative for color change, rash and wound.  Neurological:  Negative for dizziness, weakness and headaches.  Psychiatric/Behavioral:  Negative for behavioral problems. The patient is not nervous/anxious.        Immunization History  Administered Date(s) Administered   Influenza Split 05/25/2011, 05/30/2012, 05/17/2013, 06/19/2014   Influenza, High Dose Seasonal PF 05/13/2017, 04/24/2019   Influenza,inj,quad, With Preservative 04/24/2019   PFIZER(Purple Top)SARS-COV-2 Vaccination 09/07/2019, 09/28/2019   Pfizer Covid-19 Vaccine Bivalent Booster 36yr & up 05/11/2020   Pneumococcal Conjugate-13 04/12/2014   Pneumococcal Polysaccharide-23 08/27/2008   Tdap 12/06/2008   Pertinent  Health Maintenance Due  Topic Date Due   INFLUENZA VACCINE  03/03/2022   DEXA SCAN  Completed      04/21/2022    8:11 PM 04/22/2022    7:45 AM 04/22/2022   10:36 PM 04/23/2022    9:21 AM 04/24/2022    8:30 AM  FTiftin the past year?     1  Patient Fall Risk Level High fall risk High fall risk High fall risk High fall risk High fall risk  Patient at Risk for Falls Due to     History of fall(s)  Fall risk Follow up     Falls evaluation completed     Vitals:   05/13/22 1415  BP: (!) 162/74  Pulse: 66  Resp: 18  Temp: (!) 97.4 F (36.3 C)  SpO2: 96%  Weight: 175 lb (79.4 kg)  Height: '5\' 4"'$  (1.626 m)   Body mass index is 30.04 kg/m.  Physical Exam Constitutional:      Appearance: Normal appearance.  HENT:     Head: Normocephalic and atraumatic.     Nose: Nose  normal.     Mouth/Throat:     Mouth: Mucous membranes are moist.  Eyes:     Conjunctiva/sclera: Conjunctivae normal.  Cardiovascular:     Rate and Rhythm: Normal rate and regular rhythm.  Pulmonary:     Effort: Pulmonary effort is normal.     Breath sounds: Normal breath sounds.  Abdominal:     General: Bowel sounds are normal.     Palpations: Abdomen is soft.  Musculoskeletal:        General: Normal range of motion.     Cervical back: Normal range of motion.  Skin:    General: Skin is warm and dry.  Neurological:     General: No focal deficit present.     Mental Status: She is alert and oriented  to person, place, and time.  Psychiatric:        Mood and Affect: Mood normal.        Behavior: Behavior normal.        Thought Content: Thought content normal.        Judgment: Judgment normal.        Labs reviewed: Recent Labs    04/17/22 0554 04/18/22 0049 04/22/22 0728  NA 139 138 137  K 3.8 3.9 3.7  CL 107 104 102  CO2 '24 23 28  '$ GLUCOSE 121* 142* 98  BUN '16 22 11  '$ CREATININE 0.80 0.96 0.73  CALCIUM 8.9 8.5* 8.5*   No results for input(s): "AST", "ALT", "ALKPHOS", "BILITOT", "PROT", "ALBUMIN" in the last 8760 hours. Recent Labs    04/15/22 1614 04/16/22 0022 04/18/22 0049 04/19/22 0011 04/22/22 0728  WBC 10.9*   < > 14.7* 10.4 9.5  NEUTROABS 8.8*  --   --   --   --   HGB 12.7   < > 12.3 11.4* 11.0*  HCT 38.2   < > 36.6 34.5* 32.1*  MCV 95.5   < > 93.8 94.0 92.0  PLT 196   < > 157 142* 227   < > = values in this interval not displayed.   Lab Results  Component Value Date   TSH 0.383 10/31/2014   Lab Results  Component Value Date   HGBA1C 5.5 10/31/2014   Lab Results  Component Value Date   CHOL 147 10/31/2014   HDL 52 10/31/2014   LDLCALC 87 10/31/2014   TRIG 38 10/31/2014   CHOLHDL 2.8 10/31/2014    Significant Diagnostic Results in last 30 days:  DG Pelvis Portable  Result Date: 04/17/2022 CLINICAL DATA:  Status post left hip  arthroplasty. EXAM: PORTABLE PELVIS 1-2 VIEWS COMPARISON:  Earlier today FINDINGS: Postop change from left hip arthroplasty identified. The left hip arthroplasty device appears located. No signs of periprosthetic fracture or dislocation. Note: The distal end of the femoral component is excluded from these field of view. IMPRESSION: Status post left hip arthroplasty Electronically Signed   By: Kerby Moors M.D.   On: 04/17/2022 20:05   DG HIP UNILAT WITH PELVIS 1V LEFT  Result Date: 04/17/2022 CLINICAL DATA:  Left femoral neck fracture EXAM: DG HIP (WITH OR WITHOUT PELVIS) 1V*L* COMPARISON:  04/15/2022 FLUOROSCOPY TIME:  Radiation Exposure Index (as provided by the fluoroscopic device): 0.62 mGy If the device does not provide the exposure index: Fluoroscopy Time:  13 seconds Number of Acquired Images:  2 FINDINGS: Left hip prosthesis is noted in satisfactory position. No acute bony abnormality is noted. IMPRESSION: Status post left hip replacement. Electronically Signed   By: Inez Catalina M.D.   On: 04/17/2022 19:07   DG C-Arm 1-60 Min-No Report  Result Date: 04/17/2022 Fluoroscopy was utilized by the requesting physician.  No radiographic interpretation.   DG C-Arm 1-60 Min-No Report  Result Date: 04/17/2022 Fluoroscopy was utilized by the requesting physician.  No radiographic interpretation.   DG Knee Left Port  Result Date: 04/16/2022 CLINICAL DATA:  Fracture left femoral neck. EXAM: PORTABLE LEFT KNEE - 1-2 VIEW COMPARISON:  None Available. FINDINGS: Tiny joint effusion. Mild medial compartment joint space narrowing. No acute fracture is seen. No dislocation. IMPRESSION: Mild medial compartment osteoarthritis. Electronically Signed   By: Yvonne Kendall M.D.   On: 04/16/2022 14:42   DG Chest 1 View  Result Date: 04/15/2022 CLINICAL DATA:  [id number] EXAM: CHEST  1 VIEW COMPARISON:  10/31/2014 FINDINGS: Cardiac enlargement without heart failure. Lungs are clear without infiltrate or effusion. No  acute skeletal abnormality. IMPRESSION: Prominent heart size without acute abnormality. Electronically Signed   By: Franchot Gallo M.D.   On: 04/15/2022 16:12   DG Hip Unilat With Pelvis 2-3 Views Left  Result Date: 04/15/2022 CLINICAL DATA:  Fall EXAM: DG HIP (WITH OR WITHOUT PELVIS) 2-3V LEFT COMPARISON:  None Available. FINDINGS: Subcapital femoral neck fracture left with impaction and mild displacement. Left hip joint normal. No other fracture in the pelvis. Lumbar scoliosis and degenerative change. IMPRESSION: Subcapital fracture left femoral neck. Electronically Signed   By: Franchot Gallo M.D.   On: 04/15/2022 16:11    Assessment/Plan  1. Left hip pain - traMADol (ULTRAM) 50 MG tablet; Take 1 tablet (50 mg total) by mouth 2 (two) times daily as needed.  Dispense: 30 tablet; Refill: 0  2. Closed subcapital fracture of left femur, sequela -   S/P left total hip arthroplasty on 9/15 -   WBAT on LLE -  Continue PT and OT, for therapeutic strengthening exercises -   Follow-up with orthopedics  3. Benign essential HTN -  BP 135/62, stable -   Continue Norvasc  4. Mild cognitive impairment -   Stable, continue donepezil -   Continue supportive care     Family/ staff Communication: Discussed plan of care with resident and charge nurse.  Labs/tests ordered:   None    Durenda Age, DNP, MSN, FNP-BC [facility] [phone] (Monday-Friday 8:00 a.m. - 5:00 p.m.) [phone] (after hours)

## 2022-05-13 NOTE — Progress Notes (Deleted)
[facility] Room Number: 42/A Place of Service:  SNF (31) Provider:    Lawerance Cruel, MD  Patient Care Team: Lawerance Cruel, MD as PCP - General (Family Medicine) Minus Breeding, MD as PCP - Cardiology (Cardiology)  Extended Emergency Contact Information Primary Emergency Contact: Navarrette,Bill Address: [address]          [place], [place] [address] Johnnette Litter of [place] Phone: [phone] Relation: Spouse Secondary Emergency Contact: Angelia Mould, [place] [address] Johnnette Litter of [place] Phone: [phone] Relation: Daughter  Code Status:  Full Code  Managed Care Goals of care: Advanced Directive information    04/27/2022    4:04 PM  Advanced Directives  Does Patient Have a Medical Advance Directive? No  Does patient want to make changes to medical advance directive? No - Patient declined     Chief Complaint  Patient presents with   Acute Visit    Pain management    HPI:  Pt is a 81 y.o. female seen today for an acute visit for    Past Medical History:  Diagnosis Date   Graves disease    HTN (hypertension)    Low back pain    Dr. Ronnald Ramp   Osteoporosis    Pneumonia 2012   Radiation 03/20/14, 03/27/14, 04/05/14, 04/12/14, 04/17/14   bracytherapy to proximal vagina 30 gray   Scoliosis    Uterine cancer ([place])    surgery and radiation x 5 treatments-last 9'15   Vitamin D deficiency    Past Surgical History:  Procedure Laterality Date   BUNIONECTOMY     left   CATARACT EXTRACTION, BILATERAL Bilateral    last done 11-21-14   COLONOSCOPY WITH PROPOFOL N/A 12/25/2014   Procedure: COLONOSCOPY WITH PROPOFOL;  Surgeon: Garlan Fair, MD;  [facility];  Service: Endoscopy;  Laterality: N/A;   FOOT SURGERY Left 2004   Dr Johnnye Sima   JOINT REPLACEMENT     RTKA   ROBOTIC ASSISTED TOTAL HYSTERECTOMY WITH BILATERAL SALPINGO OOPHERECTOMY  01/30/14   with lymph node biopsy   TOTAL HIP ARTHROPLASTY Left 04/17/2022    Procedure: TOTAL HIP ARTHROPLASTY ANTERIOR APPROACH;  Surgeon: Rod Can, MD;  Location: [facility];  Service: Orthopedics;  Laterality: Left;   TOTAL KNEE ARTHROPLASTY     right   TOTAL KNEE REVISION Right 01/04/2019   Procedure: TOTAL KNEE REVISION;  Surgeon: Gaynelle Arabian, MD;  [facility]: [facility];  Service: Orthopedics;  Laterality: Right;  180mn with block    Allergies  Allergen Reactions   Percocet [Oxycodone-Acetaminophen] Nausea And Vomiting    Allergies as of 05/13/2022       Reactions   Percocet [oxycodone-acetaminophen] Nausea And Vomiting        Medication List        Accurate as of May 13, 2022  2:25 PM. If you have any questions, ask your nurse or doctor.          amLODipine 10 MG tablet Commonly known as: NORVASC Take 1 tablet (10 mg total) by mouth daily.   docusate sodium 100 MG capsule Commonly known as: COLACE Take 1 capsule (100 mg total) by mouth 2 (two) times daily.   donepezil 10 MG tablet Commonly known as: ARICEPT Take 1 tablet (10 mg total) by mouth at bedtime.   ibandronate 150 MG tablet Commonly known as: BONIVA Take 150 mg by mouth every 30 (thirty) days. Pt takes on the 25th  of each month   levothyroxine 88 MCG tablet Commonly known as: SYNTHROID Take 88 mcg by mouth daily before breakfast.   polyethylene glycol 17 g packet Commonly known as: MIRALAX / GLYCOLAX Take 17 g by mouth daily as needed for mild constipation.   VITAMIN D3 PO Take 2,000 Int'l Units by mouth daily.        Review of Systems  Immunization History  Administered Date(s) Administered   Influenza Split 05/25/2011, 05/30/2012, 05/17/2013, 06/19/2014   Influenza, High Dose Seasonal PF 05/13/2017, 04/24/2019   Influenza,inj,quad, With Preservative 04/24/2019   PFIZER(Purple Top)SARS-COV-2 Vaccination 09/07/2019, 09/28/2019   Pfizer Covid-19 Vaccine Bivalent Booster 72yr & up 05/11/2020   Pneumococcal Conjugate-13 04/12/2014   Pneumococcal  Polysaccharide-23 08/27/2008   Tdap 12/06/2008   Pertinent  Health Maintenance Due  Topic Date Due   INFLUENZA VACCINE  03/03/2022   DEXA SCAN  Completed      04/21/2022    8:11 PM 04/22/2022    7:45 AM 04/22/2022   10:36 PM 04/23/2022    9:21 AM 04/24/2022    8:30 AM  FPutnamin the past year?     1  Patient Fall Risk Level High fall risk High fall risk High fall risk High fall risk High fall risk  Patient at Risk for Falls Due to     History of fall(s)  Fall risk Follow up     Falls evaluation completed   Functional Status Survey:    Vitals:   05/13/22 1415  BP: (!) 162/74  Pulse: 66  Resp: 18  Temp: (!) 97.4 F (36.3 C)  SpO2: 96%  Weight: 175 lb (79.4 kg)  Height: '5\' 4"'$  (1.626 m)   Body mass index is 30.04 kg/m. Physical Exam  Labs reviewed: Recent Labs    04/17/22 0554 04/18/22 0049 04/22/22 0728  NA 139 138 137  K 3.8 3.9 3.7  CL 107 104 102  CO2 '24 23 28  '$ GLUCOSE 121* 142* 98  BUN '16 22 11  '$ CREATININE 0.80 0.96 0.73  CALCIUM 8.9 8.5* 8.5*   No results for input(s): "AST", "ALT", "ALKPHOS", "BILITOT", "PROT", "ALBUMIN" in the last 8760 hours. Recent Labs    04/15/22 1614 04/16/22 0022 04/18/22 0049 04/19/22 0011 04/22/22 0728  WBC 10.9*   < > 14.7* 10.4 9.5  NEUTROABS 8.8*  --   --   --   --   HGB 12.7   < > 12.3 11.4* 11.0*  HCT 38.2   < > 36.6 34.5* 32.1*  MCV 95.5   < > 93.8 94.0 92.0  PLT 196   < > 157 142* 227   < > = values in this interval not displayed.   Lab Results  Component Value Date   TSH 0.383 10/31/2014   Lab Results  Component Value Date   HGBA1C 5.5 10/31/2014   Lab Results  Component Value Date   CHOL 147 10/31/2014   HDL 52 10/31/2014   LDLCALC 87 10/31/2014   TRIG 38 10/31/2014   CHOLHDL 2.8 10/31/2014    Significant Diagnostic Results in last 30 days:  DG Pelvis Portable  Result Date: 04/17/2022 CLINICAL DATA:  Status post left hip arthroplasty. EXAM: PORTABLE PELVIS 1-2 VIEWS COMPARISON:   Earlier today FINDINGS: Postop change from left hip arthroplasty identified. The left hip arthroplasty device appears located. No signs of periprosthetic fracture or dislocation. Note: The distal end of the femoral component is excluded from these field of view. IMPRESSION: Status post  left hip arthroplasty Electronically Signed   By: Kerby Moors M.D.   On: 04/17/2022 20:05   DG HIP UNILAT WITH PELVIS 1V LEFT  Result Date: 04/17/2022 CLINICAL DATA:  Left femoral neck fracture EXAM: DG HIP (WITH OR WITHOUT PELVIS) 1V*L* COMPARISON:  04/15/2022 FLUOROSCOPY TIME:  Radiation Exposure Index (as provided by the fluoroscopic device): 0.62 mGy If the device does not provide the exposure index: Fluoroscopy Time:  13 seconds Number of Acquired Images:  2 FINDINGS: Left hip prosthesis is noted in satisfactory position. No acute bony abnormality is noted. IMPRESSION: Status post left hip replacement. Electronically Signed   By: Inez Catalina M.D.   On: 04/17/2022 19:07   DG C-Arm 1-60 Min-No Report  Result Date: 04/17/2022 Fluoroscopy was utilized by the requesting physician.  No radiographic interpretation.   DG C-Arm 1-60 Min-No Report  Result Date: 04/17/2022 Fluoroscopy was utilized by the requesting physician.  No radiographic interpretation.   DG Knee Left Port  Result Date: 04/16/2022 CLINICAL DATA:  Fracture left femoral neck. EXAM: PORTABLE LEFT KNEE - 1-2 VIEW COMPARISON:  None Available. FINDINGS: Tiny joint effusion. Mild medial compartment joint space narrowing. No acute fracture is seen. No dislocation. IMPRESSION: Mild medial compartment osteoarthritis. Electronically Signed   By: Yvonne Kendall M.D.   On: 04/16/2022 14:42   DG Chest 1 View  Result Date: 04/15/2022 CLINICAL DATA:  [id number] EXAM: CHEST  1 VIEW COMPARISON:  10/31/2014 FINDINGS: Cardiac enlargement without heart failure. Lungs are clear without infiltrate or effusion. No acute skeletal abnormality. IMPRESSION: Prominent heart size  without acute abnormality. Electronically Signed   By: Franchot Gallo M.D.   On: 04/15/2022 16:12   DG Hip Unilat With Pelvis 2-3 Views Left  Result Date: 04/15/2022 CLINICAL DATA:  Fall EXAM: DG HIP (WITH OR WITHOUT PELVIS) 2-3V LEFT COMPARISON:  None Available. FINDINGS: Subcapital femoral neck fracture left with impaction and mild displacement. Left hip joint normal. No other fracture in the pelvis. Lumbar scoliosis and degenerative change. IMPRESSION: Subcapital fracture left femoral neck. Electronically Signed   By: Franchot Gallo M.D.   On: 04/15/2022 16:11    Assessment/Plan There are no diagnoses linked to this encounter.   Family/ staff Communication:   Labs/tests ordered:

## 2022-05-14 ENCOUNTER — Non-Acute Institutional Stay (SKILLED_NURSING_FACILITY): Payer: Medicare PPO | Admitting: Nurse Practitioner

## 2022-05-14 ENCOUNTER — Encounter: Payer: Self-pay | Admitting: Nurse Practitioner

## 2022-05-14 DIAGNOSIS — I10 Essential (primary) hypertension: Secondary | ICD-10-CM | POA: Diagnosis not present

## 2022-05-14 DIAGNOSIS — N183 Chronic kidney disease, stage 3 unspecified: Secondary | ICD-10-CM | POA: Diagnosis not present

## 2022-05-14 DIAGNOSIS — G3184 Mild cognitive impairment, so stated: Secondary | ICD-10-CM | POA: Diagnosis not present

## 2022-05-14 DIAGNOSIS — M8000XA Age-related osteoporosis with current pathological fracture, unspecified site, initial encounter for fracture: Secondary | ICD-10-CM | POA: Diagnosis not present

## 2022-05-14 DIAGNOSIS — E039 Hypothyroidism, unspecified: Secondary | ICD-10-CM | POA: Diagnosis not present

## 2022-05-14 DIAGNOSIS — D500 Iron deficiency anemia secondary to blood loss (chronic): Secondary | ICD-10-CM | POA: Diagnosis not present

## 2022-05-14 DIAGNOSIS — K5901 Slow transit constipation: Secondary | ICD-10-CM

## 2022-05-14 DIAGNOSIS — M25552 Pain in left hip: Secondary | ICD-10-CM

## 2022-05-14 NOTE — Progress Notes (Unsigned)
Location:  [facility] Room Number: NO/42/A Place of Service:  SNF (31) Provider:  Copelyn Widmer X, NP   Patient Care Team: Lawerance Cruel, MD as PCP - General (Family Medicine) Minus Breeding, MD as PCP - Cardiology (Cardiology)  Extended Emergency Contact Information Primary Emergency Contact: Furlough,Bill Address: [address]          [place], [place] [address] Johnnette Litter of [place] Phone: [phone] Relation: Spouse Secondary Emergency Contact: Angelia Mould, [place] [address] Johnnette Litter of [place] Phone: [phone] Relation: Daughter  Code Status:  FULL Goals of care: Advanced Directive information    05/14/2022    3:24 PM  Advanced Directives  Does Patient Have a Medical Advance Directive? No  Does patient want to make changes to medical advance directive? No - Patient declined     Chief Complaint  Patient presents with   Acute Visit    Patient is being discharged in next 24 hrs    HPI:  Pt is a 81 y.o. female with past medical history significant of HTN, CKD, OP, Hypothyroidism, Mild cognitive impairment, obseity, post op anemia, and constipation was admitted to [facility] for therapy following hospital stay  Hospitalized 04/15/22-04/23/22 left total hip arthroplasty 04/17/22, for subcapital fracture of left femur sustained from fall at home, f/u Dr. Rex Kras, pain is managed with Tramadol  The patient has recovered from the left total hip arthroplasty, pain with weight bearing is controlled with Tramadol, she is ambulate with walker. She has regained her physical strength and ADL function, she is ready to return home to continue therapy.              HTN f/u Cardiology, taking Amlodipine             CKD Bun/creat 11/0.73 04/22/22             OP, T score -2.8 left femoral neck 05/2020, on Ibandronate, Ca, Vit D             Hypothyroidism, takes Levothyroxine             Mild cognitive impairment, followed by Dr. Jaynee Eagles  Neurology, on Donepezil             Post op  anemia, Hgb 11.0 04/22/22             Constipation, has MiraLax available to her.   Past Medical History:  Diagnosis Date   Graves disease    HTN (hypertension)    Low back pain    Dr. Ronnald Ramp   Osteoporosis    Pneumonia 2012   Radiation 03/20/14, 03/27/14, 04/05/14, 04/12/14, 04/17/14   bracytherapy to proximal vagina 30 gray   Scoliosis    Uterine cancer ([place])    surgery and radiation x 5 treatments-last 9'15   Vitamin D deficiency    Past Surgical History:  Procedure Laterality Date   BUNIONECTOMY     left   CATARACT EXTRACTION, BILATERAL Bilateral    last done 11-21-14   COLONOSCOPY WITH PROPOFOL N/A 12/25/2014   Procedure: COLONOSCOPY WITH PROPOFOL;  Surgeon: Garlan Fair, MD;  Location: [facility];  Service: Endoscopy;  Laterality: N/A;   FOOT SURGERY Left 2004   Dr Johnnye Sima   JOINT REPLACEMENT     RTKA   ROBOTIC ASSISTED TOTAL HYSTERECTOMY WITH BILATERAL SALPINGO OOPHERECTOMY  01/30/14   with lymph node biopsy   TOTAL HIP ARTHROPLASTY Left 04/17/2022  Procedure: TOTAL HIP ARTHROPLASTY ANTERIOR APPROACH;  Surgeon: Rod Can, MD;  Location: [facility];  Service: Orthopedics;  Laterality: Left;   TOTAL KNEE ARTHROPLASTY     right   TOTAL KNEE REVISION Right 01/04/2019   Procedure: TOTAL KNEE REVISION;  Surgeon: Gaynelle Arabian, MD;  Location: [facility];  Service: Orthopedics;  Laterality: Right;  163mn with block    Allergies  Allergen Reactions   Percocet [Oxycodone-Acetaminophen] Nausea And Vomiting    Outpatient Encounter Medications as of 05/14/2022  Medication Sig   amLODipine (NORVASC) 10 MG tablet Take 1 tablet (10 mg total) by mouth daily.   Cholecalciferol (VITAMIN D3 PO) Take 2,000 Int'l Units by mouth daily.   docusate sodium (COLACE) 100 MG capsule Take 1 capsule (100 mg total) by mouth 2 (two) times daily.   donepezil (ARICEPT) 10 MG tablet Take 1 tablet (10 mg total) by mouth at bedtime.   ibandronate (BONIVA)  150 MG tablet Take 150 mg by mouth every 30 (thirty) days. Pt takes on the 25th of each month   levothyroxine (SYNTHROID) 88 MCG tablet Take 88 mcg by mouth daily before breakfast.    polyethylene glycol (MIRALAX / GLYCOLAX) 17 g packet Take 17 g by mouth daily as needed for mild constipation.   [DISCONTINUED] traMADol (ULTRAM) 50 MG tablet Take 1 tablet (50 mg total) by mouth 2 (two) times daily as needed.   traMADol (ULTRAM) 50 MG tablet Take 1 tablet (50 mg total) by mouth 2 (two) times daily as needed.   No facility-administered encounter medications on file as of 05/14/2022.    Review of Systems  Constitutional:  Negative for activity change, appetite change and fatigue.  HENT:  Negative for congestion and trouble swallowing.   Eyes:  Negative for visual disturbance.  Respiratory:  Negative for cough, shortness of breath and wheezing.   Cardiovascular:  Positive for leg swelling. Negative for chest pain and palpitations.  Gastrointestinal:  Negative for abdominal pain, constipation, nausea and vomiting.  Genitourinary:  Negative for dysuria, frequency and urgency.  Musculoskeletal:  Positive for arthralgias and gait problem. Negative for back pain and joint swelling.  Skin:  Negative for color change.  Neurological:  Negative for speech difficulty and headaches.       Memory lapses.   Psychiatric/Behavioral:  Negative for confusion and sleep disturbance. The patient is not nervous/anxious.     Immunization History  Administered Date(s) Administered   Influenza Split 05/25/2011, 05/30/2012, 05/17/2013, 06/19/2014   Influenza, High Dose Seasonal PF 05/13/2017, 04/24/2019   Influenza,inj,quad, With Preservative 04/24/2019   PFIZER(Purple Top)SARS-COV-2 Vaccination 09/07/2019, 09/28/2019   Pfizer Covid-19 Vaccine Bivalent Booster 168yr& up 05/11/2020   Pneumococcal Conjugate-13 04/12/2014   Pneumococcal Polysaccharide-23 08/27/2008   Tdap 12/06/2008   Pertinent  Health  Maintenance Due  Topic Date Due   INFLUENZA VACCINE  03/03/2022   DEXA SCAN  Completed      04/21/2022    8:11 PM 04/22/2022    7:45 AM 04/22/2022   10:36 PM 04/23/2022    9:21 AM 04/24/2022    8:30 AM  [place] the past year?     1  Patient Fall Risk Level High fall risk High fall risk High fall risk High fall risk High fall risk  Patient at Risk for Falls Due to     History of fall(s)  Fall risk Follow up     Falls evaluation completed   Functional Status Survey:    Vitals:  05/14/22 1523  BP: (!) 162/74  Pulse: 66  Resp: 18  Temp: (!) 97.4 F (36.3 C)  SpO2: 96%  Weight: 162 lb (73.5 kg)  Height: '5\' 4"'$  (1.626 m)   Body mass index is 27.81 kg/m. Physical Exam Vitals and nursing note reviewed.  Constitutional:      Appearance: Normal appearance. She is well-developed.  HENT:     Head: Normocephalic and atraumatic.     Nose: Nose normal.     Mouth/Throat:     Mouth: Mucous membranes are moist.  Eyes:     Extraocular Movements: Extraocular movements intact.     Conjunctiva/sclera: Conjunctivae normal.     Pupils: Pupils are equal, round, and reactive to light.  Cardiovascular:     Rate and Rhythm: Normal rate and regular rhythm.     Heart sounds: No murmur heard. Pulmonary:     Effort: Pulmonary effort is normal.     Breath sounds: No rales.  Abdominal:     General: Bowel sounds are normal.     Palpations: Abdomen is soft.     Tenderness: There is no abdominal tenderness.  Musculoskeletal:        General: Normal range of motion.     Cervical back: Normal range of motion and neck supple.     Right lower leg: No edema.     Left lower leg: Edema present.     Comments: LLE  trace edema.   Skin:    General: Skin is warm and dry.     Comments: Left hip surgical scar  Neurological:     General: No focal deficit present.     Mental Status: She is alert and oriented to person, place, and time.     Motor: No weakness.     Gait: Gait abnormal.   Psychiatric:        Mood and Affect: Mood normal.        Behavior: Behavior normal.        Thought Content: Thought content normal.     Labs reviewed: Recent Labs    04/17/22 0554 04/18/22 0049 04/22/22 0728  NA 139 138 137  K 3.8 3.9 3.7  CL 107 104 102  CO2 '24 23 28  '$ GLUCOSE 121* 142* 98  BUN '16 22 11  '$ CREATININE 0.80 0.96 0.73  CALCIUM 8.9 8.5* 8.5*   No results for input(s): "AST", "ALT", "ALKPHOS", "BILITOT", "PROT", "ALBUMIN" in the last 8760 hours. Recent Labs    04/15/22 1614 04/16/22 0022 04/18/22 0049 04/19/22 0011 04/22/22 0728  WBC 10.9*   < > 14.7* 10.4 9.5  NEUTROABS 8.8*  --   --   --   --   HGB 12.7   < > 12.3 11.4* 11.0*  HCT 38.2   < > 36.6 34.5* 32.1*  MCV 95.5   < > 93.8 94.0 92.0  PLT 196   < > 157 142* 227   < > = values in this interval not displayed.   Lab Results  Component Value Date   TSH 0.383 10/31/2014   Lab Results  Component Value Date   HGBA1C 5.5 10/31/2014   Lab Results  Component Value Date   CHOL 147 10/31/2014   HDL 52 10/31/2014   LDLCALC 87 10/31/2014   TRIG 38 10/31/2014   CHOLHDL 2.8 10/31/2014    Significant Diagnostic Results in last 30 days:  No results found.  Assessment/Plan Arthralgia of hip or thigh Hospitalized 04/15/22-04/23/22 left total hip arthroplasty 04/17/22, for  subcapital fracture of left femur sustained from fall at home, f/u Dr. Rex Kras, pain is managed with Tramadol.  Benign essential HTN Intermittent elevated Sbp, the patient is asymptomatic today, f/u Cardiology, taking Amlodipine  CKD (chronic kidney disease) stage 3, GFR 30-59 ml/min (HCC) Bun/creat 11/0.73 04/22/22  Osteoporosis T score -2.8 left femoral neck 05/2020, on Ibandronate, Ca, Vit D  Hypothyroidism takes Levothyroxine, f/u with her PCP  Mild cognitive impairment followed by Dr. Jaynee Eagles Neurology, on Donepezil, the patient returns home with her husband.   Blood loss anemia Hgb 11.0 04/22/22  Slow transit  constipation has MiraLax available to her.      Family/ staff Communication: plan of care reviewed with the patient and charge nurse.   Labs/tests ordered:  none

## 2022-05-15 MED ORDER — TRAMADOL HCL 50 MG PO TABS: 50.0000 mg | ORAL_TABLET | Freq: Two times a day (BID) | ORAL | 0 refills | Status: DC | PRN

## 2022-05-15 NOTE — Assessment & Plan Note (Signed)
has MiraLax available to her.

## 2022-05-15 NOTE — Assessment & Plan Note (Addendum)
followed by Dr. Jaynee Eagles Neurology, on Donepezil, the patient returns home with her husband.

## 2022-05-15 NOTE — Assessment & Plan Note (Signed)
takes Levothyroxine, f/u with her PCP

## 2022-05-15 NOTE — Assessment & Plan Note (Signed)
Hgb 11.0 04/22/22

## 2022-05-15 NOTE — Assessment & Plan Note (Signed)
T score -2.8 left femoral neck 05/2020, on Ibandronate, Ca, Vit D

## 2022-05-15 NOTE — Assessment & Plan Note (Signed)
Hospitalized 04/15/22-04/23/22 left total hip arthroplasty 04/17/22, for subcapital fracture of left femur sustained from fall at home, f/u Dr. Rex Kras, pain is managed with Tramadol.

## 2022-05-15 NOTE — Assessment & Plan Note (Signed)
Bun/creat 11/0.73 04/22/22

## 2022-05-15 NOTE — Assessment & Plan Note (Signed)
Intermittent elevated Sbp, the patient is asymptomatic today, f/u Cardiology, taking Amlodipine

## 2022-05-17 DIAGNOSIS — I129 Hypertensive chronic kidney disease with stage 1 through stage 4 chronic kidney disease, or unspecified chronic kidney disease: Secondary | ICD-10-CM | POA: Diagnosis not present

## 2022-05-17 DIAGNOSIS — M80052D Age-related osteoporosis with current pathological fracture, left femur, subsequent encounter for fracture with routine healing: Secondary | ICD-10-CM | POA: Diagnosis not present

## 2022-05-17 DIAGNOSIS — E039 Hypothyroidism, unspecified: Secondary | ICD-10-CM | POA: Diagnosis not present

## 2022-05-17 DIAGNOSIS — D631 Anemia in chronic kidney disease: Secondary | ICD-10-CM | POA: Diagnosis not present

## 2022-05-17 DIAGNOSIS — N1831 Chronic kidney disease, stage 3a: Secondary | ICD-10-CM | POA: Diagnosis not present

## 2022-05-17 DIAGNOSIS — E0500 Thyrotoxicosis with diffuse goiter without thyrotoxic crisis or storm: Secondary | ICD-10-CM | POA: Diagnosis not present

## 2022-05-17 DIAGNOSIS — E669 Obesity, unspecified: Secondary | ICD-10-CM | POA: Diagnosis not present

## 2022-05-17 DIAGNOSIS — G3184 Mild cognitive impairment, so stated: Secondary | ICD-10-CM | POA: Diagnosis not present

## 2022-05-17 DIAGNOSIS — M419 Scoliosis, unspecified: Secondary | ICD-10-CM | POA: Diagnosis not present

## 2022-05-18 ENCOUNTER — Telehealth: Payer: Self-pay | Admitting: Cardiology

## 2022-05-18 ENCOUNTER — Encounter: Payer: Self-pay | Admitting: Nurse Practitioner

## 2022-05-18 MED ORDER — AMLODIPINE BESYLATE 10 MG PO TABS: 10.0000 mg | ORAL_TABLET | Freq: Every day | ORAL | 0 refills | Status: DC

## 2022-05-18 NOTE — Telephone Encounter (Signed)
 *  STAT* If patient is at the pharmacy, call can be transferred to refill team.   1. Which medications need to be refilled? (please list name of each medication and dose if known) amLODipine (NORVASC) 10 MG tablet  2. Which pharmacy/location (including street and city if local pharmacy) is medication to be sent to? [place], [place] - [address]  3. Do they need a 30 day or 90 day supply? 30 days

## 2022-05-26 DIAGNOSIS — S72032D Displaced midcervical fracture of left femur, subsequent encounter for closed fracture with routine healing: Secondary | ICD-10-CM | POA: Diagnosis not present

## 2022-06-05 ENCOUNTER — Telehealth: Payer: Self-pay | Admitting: Cardiology

## 2022-06-05 NOTE — Telephone Encounter (Signed)
Pt c/o medication issue:  1. Name of Medication:   amLODipine (NORVASC) 10 MG tablet   2. How are you currently taking this medication (dosage and times per day)?   Take 1 tablet (10 mg total) by mouth daily.    3. Are you having a reaction (difficulty breathing--STAT)? no  4. What is your medication issue? Patient wants to go back on 2.'5mg'$  of this medication she states the '10mg'$  of this medication makes her feet and ankles swell.  She wants a script for 2.'5mg'$  sent to [facility], [place] - [address]

## 2022-06-05 NOTE — Telephone Encounter (Signed)
Returned call to patient who states that she has been having some ankle swelling since starting the Amlodipine '10mg'$ . Patient states that she has not been taking the Amlodipine at all for several days and reports that her swelling is better off of it. Patient denies any shortness of breath, or weight gain. Patient unable to check her BP currently but reports that BP yesterday was "okay"- unable to provide number. Patient states she was previously on Amlodipine 2.'5mg'$  daily and reports she did well on it. Patient would like to know if she can return to that dosage. Advised patient that I would forward to MD for review. Patient verbalized understanding.

## 2022-06-08 MED ORDER — AMLODIPINE BESYLATE 2.5 MG PO TABS: 2.5000 mg | ORAL_TABLET | Freq: Every day | ORAL | 3 refills | Status: DC

## 2022-06-08 NOTE — Telephone Encounter (Signed)
Spoke with pt, aware of the recommendations. New script sent to the pharmacy

## 2022-06-29 ENCOUNTER — Other Ambulatory Visit: Payer: Self-pay | Admitting: Neurology

## 2022-07-02 DIAGNOSIS — Z23 Encounter for immunization: Secondary | ICD-10-CM | POA: Diagnosis not present

## 2022-07-19 NOTE — Progress Notes (Unsigned)
Cardiology Office Note   Date:  07/21/2022   ID:  Tamanna, [place] [birth year], MRN [id number]  PCP:  Lawerance Cruel, MD  Cardiologist:   Minus Breeding, MD    Chief Complaint  Patient presents with   Edema      History of Present Illness: Terri Ortega is a 81 y.o. female who presents for follow up of a mildly reduced ejection fraction.  This was noted 7 years ago.  Her EF was found to be 45% on an echocardiogram.  However, subsequent follow-up demonstrated to be normalized to 65%.  At the last visit she was having dizziness.  She has seen neurology.  On CT and she has some small vessel disease.  She had a monitor without arrhythmia.     Since I last saw her she had a fall.  She broke her left hip and had to have surgical repair.  She has not had any new cardiac problems however.  The patient denies any new symptoms such as chest discomfort, neck or arm discomfort. There has been no new shortness of breath, PND or orthopnea. There have been no reported palpitations, presyncope or syncope.    She gets around slowly with a cane.     Past Medical History:  Diagnosis Date   Graves disease    HTN (hypertension)    Low back pain    Dr. Ronnald Ramp   Osteoporosis    Pneumonia 2012   Radiation 03/20/14, 03/27/14, 04/05/14, 04/12/14, 04/17/14   bracytherapy to proximal vagina 30 gray   Scoliosis    Uterine cancer ([place])    surgery and radiation x 5 treatments-last 9'15   Vitamin D deficiency     Past Surgical History:  Procedure Laterality Date   BUNIONECTOMY     left   CATARACT EXTRACTION, BILATERAL Bilateral    last done 11-21-14   COLONOSCOPY WITH PROPOFOL N/A 12/25/2014   Procedure: COLONOSCOPY WITH PROPOFOL;  Surgeon: Garlan Fair, MD;  [facility];  Service: Endoscopy;  Laterality: N/A;   FOOT SURGERY Left 2004   Dr Johnnye Sima   JOINT REPLACEMENT     RTKA   ROBOTIC ASSISTED TOTAL HYSTERECTOMY WITH BILATERAL SALPINGO OOPHERECTOMY  01/30/14   with  lymph node biopsy   TOTAL HIP ARTHROPLASTY Left 04/17/2022   Procedure: TOTAL HIP ARTHROPLASTY ANTERIOR APPROACH;  Surgeon: Rod Can, MD;  Location: [facility];  Service: Orthopedics;  Laterality: Left;   TOTAL KNEE ARTHROPLASTY     right   TOTAL KNEE REVISION Right 01/04/2019   Procedure: TOTAL KNEE REVISION;  Surgeon: Gaynelle Arabian, MD;  [facility];  Service: Orthopedics;  Laterality: Right;  158mn with block     Current Outpatient Medications  Medication Sig Dispense Refill   amLODipine (NORVASC) 2.5 MG tablet Take 1 tablet (2.5 mg total) by mouth daily. Keep upcoming appointment for future refills. 90 tablet 3   Cholecalciferol (VITAMIN D3 PO) Take 2,000 Int'l Units by mouth daily.     furosemide (LASIX) 20 MG tablet Take 1 tablet (20 mg total) by mouth daily as needed for fluid or edema. 20 tablet 3   ibandronate (BONIVA) 150 MG tablet Take 150 mg by mouth every 30 (thirty) days. Pt takes on the 25th of each month (Is on hold)     levothyroxine (SYNTHROID) 88 MCG tablet Take 88 mcg by mouth daily before breakfast.      traMADol (ULTRAM) 50 MG tablet Take 1 tablet (50 mg total) by  mouth 2 (two) times daily as needed. 20 tablet 0   No current facility-administered medications for this visit.    Allergies:   Percocet [oxycodone-acetaminophen]    ROS:  Please see the history of present illness.   Otherwise, review of systems are positive for diffuse bony aches with tenderness to none.   All other systems are reviewed and negative.    PHYSICAL EXAM: VS:  BP 138/84   Pulse (!) 55   Wt 176 lb 9.6 oz (80.1 kg)   SpO2 98%   BMI 30.31 kg/m  , BMI Body mass index is 30.31 kg/m. GENERAL:  Well appearing NECK:  No jugular venous distention, waveform within normal limits, carotid upstroke brisk and symmetric, no bruits, no thyromegaly LUNGS:  Clear to auscultation bilaterally CHEST:  Unremarkable HEART:  PMI not displaced or sustained,S1 and S2 within normal limits, no S3, no  S4, no clicks, no rubs, no murmurs ABD:  Flat, positive bowel sounds normal in frequency in pitch, no bruits, no rebound, no guarding, no midline pulsatile mass, no hepatomegaly, no splenomegaly EXT:  2 plus pulses throughout, moderate leg edema, no cyanosis no clubbing   EKG:  EKG is  ordered today. Sinus rhythm, rate 55, axis within normal limits, mild QRS widening, no acute ST-T wave changes.   Recent Labs: 04/22/2022: BUN 11; Creatinine, Ser 0.73; Hemoglobin 11.0; Platelets 227; Potassium 3.7; Sodium 137    Lipid Panel    Component Value Date/Time   CHOL 147 10/31/2014 0241   TRIG 38 10/31/2014 0241   HDL 52 10/31/2014 0241   CHOLHDL 2.8 10/31/2014 0241   VLDL 8 10/31/2014 0241   LDLCALC 87 10/31/2014 0241      Wt Readings from Last 3 Encounters:  07/21/22 176 lb 9.6 oz (80.1 kg)  05/14/22 162 lb (73.5 kg)  05/13/22 175 lb (79.4 kg)      Other studies Reviewed: Additional studies/ records that were reviewed today include: Hospital records Review of the above records demonstrates:  Please see elsewhere in the note.     ASSESSMENT AND PLAN:   CARDIOMYOPATHY:   I do not suspect heart failure.  Her exam is unremarkable.  She is not having any shortness of breath.  EDEMA: I suspect this is venous insufficiency related to her injury and her decreased mobility.  I am to give her Lasix 20 mg just for 3 days.  We talked about salt restriction and fluid restriction.  I outlined how to keep her feet elevated.  She should wear compression stockings.  I will check a BNP level though again I do not strongly suspect heart failure.  HTN:  The blood pressure is at target although upper limits.  She can keep an eye on this.  It has been labile.   AORTIC VALVE DISEASE: I do not appreciate any change in exam.  I will be checking a BNP level.  If this is elevated she will need an echocardiogram.  BRADYCARDIA:   She tolerates this.  No change in therapy.    Current medicines are  reviewed at length with the patient today.  The patient does not have concerns regarding medicines.  The following changes have been made:    None  Labs/ tests ordered today include:     Orders Placed This Encounter  Procedures   B Nat Peptide     Disposition:   FU with me in 12 months.   Signed, Minus Breeding, MD  07/21/2022 2:13 PM  [facility]

## 2022-07-21 ENCOUNTER — Encounter: Payer: Self-pay | Admitting: Cardiology

## 2022-07-21 ENCOUNTER — Ambulatory Visit: Payer: Medicare PPO | Attending: Cardiology | Admitting: Cardiology

## 2022-07-21 VITALS — BP 138/84 | HR 55 | Wt 176.6 lb

## 2022-07-21 DIAGNOSIS — I10 Essential (primary) hypertension: Secondary | ICD-10-CM | POA: Diagnosis not present

## 2022-07-21 DIAGNOSIS — R601 Generalized edema: Secondary | ICD-10-CM

## 2022-07-21 DIAGNOSIS — I420 Dilated cardiomyopathy: Secondary | ICD-10-CM | POA: Diagnosis not present

## 2022-07-21 DIAGNOSIS — R609 Edema, unspecified: Secondary | ICD-10-CM

## 2022-07-21 MED ORDER — FUROSEMIDE 20 MG PO TABS: 20.0000 mg | ORAL_TABLET | Freq: Every day | ORAL | 3 refills | Status: DC | PRN

## 2022-07-21 NOTE — Patient Instructions (Signed)
Medication Instructions:  Your physician has recommended you make the following change in your medication:  START: Lasix '20mg'$  daily as needed for swelling. Please take one tablet daily for the next 3 days per Dr. Rosezella [place] orders.  *If you need a refill on your cardiac medications before your next appointment, please call your pharmacy*   Lab Work: Your physician recommends that you have the following lab drawn today: BNP  If you have labs (blood work) drawn today and your tests are completely normal, you will receive your results only by: [place] (if you have MyChart) OR A paper copy in the mail If you have any lab test that is abnormal or we need to change your treatment, we will call you to review the results.   Testing/Procedures: NONE   Follow-Up: At [facility], you and your health needs are our priority.  As part of our continuing mission to provide you with exceptional heart care, we have created designated Provider Care Teams.  These Care Teams include your primary Cardiologist (physician) and Advanced Practice Providers (APPs -  Physician Assistants and Nurse Practitioners) who all work together to provide you with the care you need, when you need it.  We recommend signing up for the patient portal called "MyChart".  Sign up information is provided on this After Visit Summary.  MyChart is used to connect with patients for Virtual Visits (Telemedicine).  Patients are able to view lab/test results, encounter notes, upcoming appointments, etc.  Non-urgent messages can be sent to your provider as well.   To learn more about what you can do with MyChart, go to [email or web address].    Your next appointment:   1 year(s)  The format for your next appointment:   In Person  Provider:   Minus Breeding, MD

## 2022-07-23 LAB — BRAIN NATRIURETIC PEPTIDE: BNP: 123.2 pg/mL — ABNORMAL HIGH (ref 0.0–100.0)

## 2022-07-31 NOTE — Progress Notes (Signed)
This encounter was created in error - please disregard.

## 2022-08-04 ENCOUNTER — Encounter: Payer: Self-pay | Admitting: *Deleted

## 2022-08-12 ENCOUNTER — Ambulatory Visit (HOSPITAL_BASED_OUTPATIENT_CLINIC_OR_DEPARTMENT_OTHER): Payer: Medicare PPO | Attending: Family Medicine | Admitting: Physical Therapy

## 2022-08-12 ENCOUNTER — Other Ambulatory Visit: Payer: Self-pay

## 2022-08-12 ENCOUNTER — Encounter (HOSPITAL_BASED_OUTPATIENT_CLINIC_OR_DEPARTMENT_OTHER): Payer: Self-pay | Admitting: Physical Therapy

## 2022-08-12 DIAGNOSIS — R2689 Other abnormalities of gait and mobility: Secondary | ICD-10-CM | POA: Insufficient documentation

## 2022-08-12 DIAGNOSIS — Z96659 Presence of unspecified artificial knee joint: Secondary | ICD-10-CM | POA: Insufficient documentation

## 2022-08-12 DIAGNOSIS — Z9889 Other specified postprocedural states: Secondary | ICD-10-CM | POA: Diagnosis not present

## 2022-08-12 DIAGNOSIS — M25652 Stiffness of left hip, not elsewhere classified: Secondary | ICD-10-CM | POA: Diagnosis not present

## 2022-08-12 DIAGNOSIS — M6281 Muscle weakness (generalized): Secondary | ICD-10-CM | POA: Diagnosis not present

## 2022-08-12 NOTE — Therapy (Signed)
OUTPATIENT PHYSICAL THERAPY LOWER EXTREMITY EVALUATION   Patient Name: Terri Ortega MRN: [id number] DOB:Feb 07, 1941, 82 y.o., female Today's Date: 08/12/2022  END OF SESSION:  PT End of Session - 08/12/22 1310     Visit Number 1    Number of Visits 12    Date for PT Re-Evaluation 09/23/22    PT Start Time 1148    PT Stop Time 1233    PT Time Calculation (min) 45 min    Activity Tolerance Patient tolerated treatment well    Behavior During Therapy [facility] for tasks assessed/performed             Past Medical History:  Diagnosis Date   Graves disease    HTN (hypertension)    Low back pain    Dr. Ronnald Ramp   Osteoporosis    Pneumonia 2012   Radiation 03/20/14, 03/27/14, 04/05/14, 04/12/14, 04/17/14   bracytherapy to proximal vagina 30 gray   Scoliosis    Uterine cancer ([place])    surgery and radiation x 5 treatments-last 9'15   Vitamin D deficiency    Past Surgical History:  Procedure Laterality Date   BUNIONECTOMY     left   CATARACT EXTRACTION, BILATERAL Bilateral    last done 11-21-14   COLONOSCOPY WITH PROPOFOL N/A 12/25/2014   Procedure: COLONOSCOPY WITH PROPOFOL;  Surgeon: Garlan Fair, MD;  [facility];  Service: Endoscopy;  Laterality: N/A;   FOOT SURGERY Left 2004   Dr Johnnye Sima   JOINT REPLACEMENT     RTKA   ROBOTIC ASSISTED TOTAL HYSTERECTOMY WITH BILATERAL SALPINGO OOPHERECTOMY  01/30/14   with lymph node biopsy   TOTAL HIP ARTHROPLASTY Left 04/17/2022   Procedure: TOTAL HIP ARTHROPLASTY ANTERIOR APPROACH;  Surgeon: Rod Can, MD;  Location: [place];  Service: Orthopedics;  Laterality: Left;   TOTAL KNEE ARTHROPLASTY     right   TOTAL KNEE REVISION Right 01/04/2019   Procedure: TOTAL KNEE REVISION;  Surgeon: Gaynelle Arabian, MD;  [facility];  Service: Orthopedics;  Laterality: Right;  124mn with block   Patient Active Problem List   Diagnosis Date Noted   Nausea 04/27/2022   Blood loss anemia 04/24/2022   Slow transit constipation  04/24/2022   Subcapital fracture of femur, left, closed, initial encounter ([place] 04/15/2022   Fall at home, initial encounter 04/15/2022   CKD (chronic kidney disease) stage 3, GFR 30-59 ml/min ([place] 04/15/2022   Hypertensive urgency 04/15/2022   Osteoporosis 04/15/2022   Aortic valve disease 07/10/2021   Dilated cardiomyopathy ([place] 12/14/2020   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   Orthostatic dizziness 11/14/2020   Educated about COVID-19 virus infection 12/21/2019   Mild cognitive impairment 09/06/2019   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[place] 01/04/2019   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   Preop cardiovascular exam 12/20/2018   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   Uterine cancer ([place] 03/28/2018   Arthralgia of hip or thigh 03/28/2018   Benign essential HTN 03/28/2018   Overweight 03/28/2018   Heart disease 11/09/2014   Leukocytosis 025/36/6440  Diastolic congestive heart failure ([place] 10/30/2014   Hypothyroidism 10/30/2014   Chest pain at rest 10/30/2014   Chest pain 10/30/2014   Endometrial cancer ([place] 01/30/2014   Dyspnea 01/11/2014   Edema 01/11/2014   Low back pain with right-sided sciatica 12/13/2013   Degeneration of lumbar or lumbosacral intervertebral disc 12/13/2013   Sciatica 12/13/2013    PCP: CLawerance CruelMD  REFERRING PROVIDER: SNemiah CommanderNP  REFERRING  DIAG:  Diagnosis  R26.89 (ICD-10-CM) - Other abnormalities of gait and mobility  Z96.659 (ICD-10-CM) - Presence of unspecified artificial knee joint  Z98.890 (ICD-10-CM) - Other specified postprocedural states    THERAPY DIAG:  Other abnormalities of gait and mobility  Muscle weakness (generalized)  Stiffness of left hip, not elsewhere classified  Rationale for Evaluation and Treatment: Rehabilitation  ONSET DATE: 9/14 right hip arthroplasty following fall and fx  SUBJECTIVE:   SUBJECTIVE STATEMENT: Patient is a 82 year old female status  post fall and hip fracture on 04/17/2022.  She had an anterior total hip arthroplasty on 9/14.  She went to a subsequent rehabilitation  followed by home health physical therapy.  She currently feels like she is unsteady on her feet.  She is using a cane for primary mobility outside the home.  She is using a walker inside the home.  She has not had any falls.  Therapy spoke with her daughter who feels like her general mobility has declined since her fall.  PERTINENT HISTORY:  PAIN:  Are you having pain? Yes: NPRS scale: 0/10 No real Pain at this tine.  Pain location: left hip at times  Pain description: aching  Aggravating factors: standing and walking  Relieving factors: rest   PRECAUTIONS: None  WEIGHT BEARING RESTRICTIONS: No  FALLS:  Has patient fallen in last 6 months? Yes. Number of falls 1 resulting in a hip fracture.   LIVING ENVIRONMENT: 4 steps in the front door  OCCUPATION:   Hobbies: Nothing specific   PLOF: Independent  PATIENT GOALS:  Put pants on.   NEXT MD VISIT:   OBJECTIVE:   DIAGNOSTIC FINDINGS:   PATIENT SURVEYS:  FOTO    COGNITION: Overall cognitive status: Within functional limits for tasks assessed     SENSATION: WFL  EDEMA:  Has noticed some swelling following the fall  MUSCLE LENGTH:  POSTURE: Forward head and rounded shoulders  PALPATION:   LOWER EXTREMITY ROM:  Passive ROM Right eval Left eval  Hip flexion    Hip extension    Hip abduction    Hip adduction    Hip internal rotation  15  Hip external rotation  10  Knee flexion    Knee extension    Ankle dorsiflexion    Ankle plantarflexion    Ankle inversion    Ankle eversion     (Blank rows = not tested)  LOWER EXTREMITY MMT:  MMT Right eval Left eval  Hip flexion 19.9 12.2  Hip extension    Hip abduction 16.7 10.8  Hip adduction    Hip internal rotation    Hip external rotation    Knee flexion    Knee extension 20.7 22.5  Ankle dorsiflexion    Ankle  plantarflexion    Ankle inversion    Ankle eversion     (Blank rows = not tested)   FUNCTIONAL TESTS:  Sit to stand  GAIT:  Significant lateral shift to the left in left weight bearing, decreased left hip flexion.   Balance:  Tandem stance: left foot back min to mod a  Right foot back CGA to min a   Narrow base I eyes open  Narrow base CGA eyes closed     TODAY'S TREATMENT:  DATE:  Access Code: [email or web address] URL: [email or web address] Date: 08/12/2022 Prepared by: Carolyne Littles  Exercises - Seated Knee Extension with Resistance  - 1 x daily - 7 x weekly - 3 sets - 10 reps - Seated Hip Abduction with Resistance  - 1 x daily - 7 x weekly - 3 sets - 10 reps - Seated Knee Lifts with Resistance  - 1 x daily - 7 x weekly - 3 sets - 10 reps  PATIENT EDUCATION:  Education details: HEP, symptom management, safety with ambulation  Person educated: Patient Education method: Explanation, Demonstration, Tactile cues, Verbal cues, and Handouts Education comprehension: verbalized understanding, returned demonstration, verbal cues required, tactile cues required, and needs further education  HOME EXERCISE PROGRAM: Access Code: [email or web address] URL: [email or web address] Date: 08/12/2022 Prepared by: Carolyne Littles  Exercises - Seated Knee Extension with Resistance  - 1 x daily - 7 x weekly - 3 sets - 10 reps - Seated Hip Abduction with Resistance  - 1 x daily - 7 x weekly - 3 sets - 10 reps - Seated Knee Lifts with Resistance  - 1 x daily - 7 x weekly - 3 sets - 10 reps  ASSESSMENT:  CLINICAL IMPRESSION: Patient is an 82 year old female status post left hip total arthroplasty on 04/16/2022 following a fracture.  She presents with limited hip flexion and a significant limitation in hip external rotation.  She has a strength deficit with hip  abduction and hip flexion on the left compared to right.  She has decreased left foot clearance with ambulation and a lateral shift to the left and left weightbearing.  She reports functionally she is having difficulty putting on her pants.  She would benefit from further skilled therapy to improve her ability to perform daily tasks around the house and improve her stability with ambulation.  OBJECTIVE IMPAIRMENTS: Abnormal gait, decreased activity tolerance, decreased endurance, decreased mobility, difficulty walking, decreased ROM, decreased strength, impaired flexibility, and pain.   ACTIVITY LIMITATIONS: standing, stairs, transfers, bed mobility, dressing, and locomotion level  PARTICIPATION LIMITATIONS: meal prep, cleaning, laundry, shopping, community activity, and yard work  PERSONAL FACTORS: Age and 1-2 comorbidities: right knee replacement, scoliosis  are also affecting patient's functional outcome.   REHAB POTENTIAL: Good  CLINICAL DECISION MAKING: Evolving/moderate complexity  EVALUATION COMPLEXITY: Moderate   GOALS: Goals reviewed with patient? Yes  SHORT TERM GOALS: Target date: 09/02/2022   Patient will increase passive hip flexion by 10 degrees Baseline: Goal status: INITIAL  2.  Patient will increase passive hip external rotation by 20 degrees Baseline:  Goal status: INITIAL  3.  Patient will transfer sit to stand without use of hands Baseline:  Goal status: INITIAL  4.  Patient will be independent with basic HEP Baseline:  Goal status: INITIAL   LONG TERM GOALS: Target date: 09/23/2022    Patient will put pants on without difficulty Baseline:  Goal status: INITIAL  2.  Patient will go up and down 4 steps without difficulty Baseline:  Goal status: INITIAL  3.  Patient will ambulate 2000 feet in the community without self-report of decreased balance or safety Baseline:  Goal status: INITIAL  PLAN:  PT FREQUENCY: 2x/week  PT DURATION: 6  weeks  PLANNED INTERVENTIONS: Therapeutic exercises, Therapeutic activity, Neuromuscular re-education, Balance training, Gait training, Patient/Family education, Self Care, Joint mobilization, Stair training, DME instructions, Aquatic Therapy, Electrical stimulation, Cryotherapy, Moist heat, Taping, Ultrasound, and Manual therapy  PLAN FOR NEXT SESSION: Consider passive range of motion into flexion and external rotation.  Begin strengthening of hip muscles in different positions.  Review HEP.  Updated HEP.  Consider balance exercises.  Referring diagnosis?  R26.89 (ICD-10-CM) - Other abnormalities of gait and mobility  Z96.659 (ICD-10-CM) - Presence of unspecified artificial knee joint  Z98.890 (ICD-10-CM) - Other specified postprocedural states   Treatment diagnosis? (if different than referring diagnosis)   What was this (referring dx) caused by? '[x]'$  Surgery '[]'$  Fall '[]'$  Ongoing issue '[]'$  Arthritis '[]'$  Other: ____________  Laterality: '[]'$  Rt '[x]'$  Lt '[]'$  Both  Check all possible CPT codes:  *CHOOSE 10 OR LESS*    '[]'$  97110 (Therapeutic Exercise)  '[]'$  92507 (SLP Treatment)  '[]'$  97112 (Neuro Re-ed)   '[]'$  92526 (Swallowing Treatment)   '[]'$  97116 (Gait Training)   '[]'$  [id number] (Cognitive Training, 1st 15 minutes) '[]'$  97140 (Manual Therapy)   '[]'$  97130 (Cognitive Training, each add'l 15 minutes)  '[]'$  97164 (Re-evaluation)                              '[]'$  Other, List CPT Code ____________  '[]'$  77824 (Therapeutic Activities)     '[]'$  23536 (Self Care)   '[x]'$  All codes above (97110 - 97535)  '[]'$  97012 (Mechanical Traction)  '[x]'$  97014 (E-stim Unattended)  '[]'$  97032 (E-stim manual)  '[]'$  97033 (Ionto)  '[]'$  97035 (Ultrasound) '[]'$  97750 (Physical Performance Training) '[]'$  [id number] (Aquatic Therapy) '[]'$  97016 (Vasopneumatic Device) '[]'$  [id number] (Paraffin) '[]'$  97034 (Contrast Bath) '[]'$  97597 (Wound Care 1st 20 sq cm) '[]'$  97598 (Wound Care each add'l 20 sq cm) '[]'$  97760 (Orthotic Fabrication, Fitting, Training Initial) '[]'$   [id number] (Prosthetic Management and Training Initial) '[]'$  [id number] (Orthotic or Prosthetic Training/ Modification Subsequent)   Carney Living, PT 08/12/2022, 2:47 PM

## 2022-08-17 ENCOUNTER — Ambulatory Visit (HOSPITAL_BASED_OUTPATIENT_CLINIC_OR_DEPARTMENT_OTHER): Payer: Medicare PPO | Admitting: Physical Therapy

## 2022-08-17 DIAGNOSIS — Z85828 Personal history of other malignant neoplasm of skin: Secondary | ICD-10-CM | POA: Diagnosis not present

## 2022-08-17 DIAGNOSIS — L812 Freckles: Secondary | ICD-10-CM | POA: Diagnosis not present

## 2022-08-17 DIAGNOSIS — L821 Other seborrheic keratosis: Secondary | ICD-10-CM | POA: Diagnosis not present

## 2022-08-19 DIAGNOSIS — Z79899 Other long term (current) drug therapy: Secondary | ICD-10-CM | POA: Diagnosis not present

## 2022-08-19 DIAGNOSIS — I5030 Unspecified diastolic (congestive) heart failure: Secondary | ICD-10-CM | POA: Diagnosis not present

## 2022-08-19 DIAGNOSIS — G4733 Obstructive sleep apnea (adult) (pediatric): Secondary | ICD-10-CM | POA: Diagnosis not present

## 2022-08-19 DIAGNOSIS — I10 Essential (primary) hypertension: Secondary | ICD-10-CM | POA: Diagnosis not present

## 2022-08-19 DIAGNOSIS — M810 Age-related osteoporosis without current pathological fracture: Secondary | ICD-10-CM | POA: Diagnosis not present

## 2022-08-19 DIAGNOSIS — Z6829 Body mass index (BMI) 29.0-29.9, adult: Secondary | ICD-10-CM | POA: Diagnosis not present

## 2022-08-19 DIAGNOSIS — E039 Hypothyroidism, unspecified: Secondary | ICD-10-CM | POA: Diagnosis not present

## 2022-08-20 ENCOUNTER — Ambulatory Visit (HOSPITAL_BASED_OUTPATIENT_CLINIC_OR_DEPARTMENT_OTHER): Payer: Medicare PPO | Admitting: Physical Therapy

## 2022-08-20 ENCOUNTER — Encounter (HOSPITAL_BASED_OUTPATIENT_CLINIC_OR_DEPARTMENT_OTHER): Payer: Self-pay | Admitting: Physical Therapy

## 2022-08-20 DIAGNOSIS — Z9889 Other specified postprocedural states: Secondary | ICD-10-CM | POA: Diagnosis not present

## 2022-08-20 DIAGNOSIS — R2689 Other abnormalities of gait and mobility: Secondary | ICD-10-CM | POA: Diagnosis not present

## 2022-08-20 DIAGNOSIS — M25652 Stiffness of left hip, not elsewhere classified: Secondary | ICD-10-CM | POA: Diagnosis not present

## 2022-08-20 DIAGNOSIS — M6281 Muscle weakness (generalized): Secondary | ICD-10-CM | POA: Diagnosis not present

## 2022-08-20 DIAGNOSIS — Z96659 Presence of unspecified artificial knee joint: Secondary | ICD-10-CM | POA: Diagnosis not present

## 2022-08-20 NOTE — Therapy (Signed)
OUTPATIENT PHYSICAL THERAPY LOWER EXTREMITY EVALUATION   Patient Name: Terri Ortega MRN: [id number] DOB:12/27/1940, 82 y.o., female Today's Date: 08/20/2022  END OF SESSION:    Past Medical History:  Diagnosis Date   Graves disease    HTN (hypertension)    Low back pain    Dr. Ronnald Ramp   Osteoporosis    Pneumonia 2012   Radiation 03/20/14, 03/27/14, 04/05/14, 04/12/14, 04/17/14   bracytherapy to proximal vagina 30 gray   Scoliosis    Uterine cancer ([place])    surgery and radiation x 5 treatments-last 9'15   Vitamin D deficiency    Past Surgical History:  Procedure Laterality Date   BUNIONECTOMY     left   CATARACT EXTRACTION, BILATERAL Bilateral    last done 11-21-14   COLONOSCOPY WITH PROPOFOL N/A 12/25/2014   Procedure: COLONOSCOPY WITH PROPOFOL;  Surgeon: Garlan Fair, MD;  [facility];  Service: Endoscopy;  Laterality: N/A;   FOOT SURGERY Left 2004   Dr Johnnye Sima   JOINT REPLACEMENT     RTKA   ROBOTIC ASSISTED TOTAL HYSTERECTOMY WITH BILATERAL SALPINGO OOPHERECTOMY  01/30/14   with lymph node biopsy   TOTAL HIP ARTHROPLASTY Left 04/17/2022   Procedure: TOTAL HIP ARTHROPLASTY ANTERIOR APPROACH;  Surgeon: Rod Can, MD;  Location: [place];  Service: Orthopedics;  Laterality: Left;   TOTAL KNEE ARTHROPLASTY     right   TOTAL KNEE REVISION Right 01/04/2019   Procedure: TOTAL KNEE REVISION;  Surgeon: Gaynelle Arabian, MD;  [facility];  Service: Orthopedics;  Laterality: Right;  144mn with block   Patient Active Problem List   Diagnosis Date Noted   Nausea 04/27/2022   Blood loss anemia 04/24/2022   Slow transit constipation 04/24/2022   Subcapital fracture of femur, left, closed, initial encounter ([place] 04/15/2022   Fall at home, initial encounter 04/15/2022   CKD (chronic kidney disease) stage 3, GFR 30-59 ml/min ([facility] 04/15/2022   Hypertensive urgency 04/15/2022   Osteoporosis 04/15/2022   Aortic valve disease 07/10/2021   Dilated  cardiomyopathy ([place] 12/14/2020   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   Orthostatic dizziness 11/14/2020   Educated about COVID-19 virus infection 12/21/2019   Mild cognitive impairment 09/06/2019   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[facility] 01/04/2019   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 ([facility] 01/04/2019   Preop cardiovascular exam 12/20/2018   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   Uterine cancer ([place] 03/28/2018   Arthralgia of hip or thigh 03/28/2018   Benign essential HTN 03/28/2018   Overweight 03/28/2018   Heart disease 11/09/2014   Leukocytosis 076/80/8811  Diastolic congestive heart failure ([facility] 10/30/2014   Hypothyroidism 10/30/2014   Chest pain at rest 10/30/2014   Chest pain 10/30/2014   Endometrial cancer ([facility] 01/30/2014   Dyspnea 01/11/2014   Edema 01/11/2014   Low back pain with right-sided sciatica 12/13/2013   Degeneration of lumbar or lumbosacral intervertebral disc 12/13/2013   Sciatica 12/13/2013    PCP: CLawerance CruelMD  REFERRING PROVIDER: SNemiah CommanderNP  REFERRING DIAG:  Diagnosis  R26.89 (ICD-10-CM) - Other abnormalities of gait and mobility  Z96.659 (ICD-10-CM) - Presence of unspecified artificial knee joint  Z98.890 (ICD-10-CM) - Other specified postprocedural states    THERAPY DIAG:  No diagnosis found.  Rationale for Evaluation and Treatment: Rehabilitation  ONSET DATE: 9/14 Left  hip arthroplasty following fall and fx  SUBJECTIVE:   SUBJECTIVE STATEMENT: The patient reports her hip is doing OK. She was not feeling well on Tuesday.  Eval: Patient is a 82 year old female status post fall and hip fracture on 04/17/2022.  She had an anterior total hip arthroplasty on 9/14.  She went to a subsequent rehabilitation  followed by home health physical therapy.  She currently feels like she is unsteady on her feet.  She is using a cane for primary mobility outside the home.  She is using a walker inside the home.  She  has not had any falls.  Therapy spoke with her daughter who feels like her general mobility has declined since her fall.  PERTINENT HISTORY:  PAIN:  Are you having pain? Yes: NPRS scale: 0/10 No real Pain at this tine.  Pain location: left hip at times  Pain description: aching  Aggravating factors: standing and walking  Relieving factors: rest   PRECAUTIONS: None  WEIGHT BEARING RESTRICTIONS: No  FALLS:  Has patient fallen in last 6 months? Yes. Number of falls 1 resulting in a hip fracture.   LIVING ENVIRONMENT: 4 steps in the front door  OCCUPATION:   Hobbies: Nothing specific   PLOF: Independent  PATIENT GOALS:  Put pants on.   NEXT MD VISIT:   OBJECTIVE:   DIAGNOSTIC FINDINGS:   PATIENT SURVEYS:  FOTO    COGNITION: Overall cognitive status: Within functional limits for tasks assessed     SENSATION: WFL  EDEMA:  Has noticed some swelling following the fall  MUSCLE LENGTH:  POSTURE: Forward head and rounded shoulders  PALPATION:   LOWER EXTREMITY ROM:  Passive ROM Right eval Left eval  Hip flexion    Hip extension    Hip abduction    Hip adduction    Hip internal rotation  15  Hip external rotation  10  Knee flexion    Knee extension    Ankle dorsiflexion    Ankle plantarflexion    Ankle inversion    Ankle eversion     (Blank rows = not tested)  LOWER EXTREMITY MMT:  MMT Right eval Left eval  Hip flexion 19.9 12.2  Hip extension    Hip abduction 16.7 10.8  Hip adduction    Hip internal rotation    Hip external rotation    Knee flexion    Knee extension 20.7 22.5  Ankle dorsiflexion    Ankle plantarflexion    Ankle inversion    Ankle eversion     (Blank rows = not tested)   FUNCTIONAL TESTS:  Sit to stand  GAIT:  Significant lateral shift to the left in left weight bearing, decreased left hip flexion.   Balance:  Tandem stance: left foot back min to mod a  Right foot back CGA to min a   Narrow base I eyes open   Narrow base CGA eyes closed     TODAY'S TREATMENT:  DATE:  1/18 Nu-step 5 min  Manual: PROM into flexion ER and IR with emphasis on ER   Supine exercises:  LTR x10  Supine march 2x10  Supine hip abduction 2x10  PPT with mod cuing x10   SAQ 2x10   Sit tot stand 3x and 5x min cuing for the first trial none the second  Standing heel raise x20     Eval Access Code: [email or web address] URL: [email or web address] Date: 08/12/2022 Prepared by: Carolyne Littles  Exercises - Seated Knee Extension with Resistance  - 1 x daily - 7 x weekly - 3 sets - 10 reps - Seated Hip Abduction with Resistance  - 1 x daily - 7 x weekly - 3 sets - 10 reps - Seated Knee Lifts with Resistance  - 1 x daily - 7 x weekly - 3 sets - 10 reps  PATIENT EDUCATION:  Education details: HEP, symptom management, safety with ambulation  Person educated: Patient Education method: Explanation, Demonstration, Tactile cues, Verbal cues, and Handouts Education comprehension: verbalized understanding, returned demonstration, verbal cues required, tactile cues required, and needs further education  HOME EXERCISE PROGRAM: Access Code: [email or web address] URL: [email or web address] Date: 08/12/2022 Prepared by: Carolyne Littles  Exercises - Seated Knee Extension with Resistance  - 1 x daily - 7 x weekly - 3 sets - 10 reps - Seated Hip Abduction with Resistance  - 1 x daily - 7 x weekly - 3 sets - 10 reps - Seated Knee Lifts with Resistance  - 1 x daily - 7 x weekly - 3 sets - 10 reps  ASSESSMENT:  CLINICAL IMPRESSION: The patient tolerated treatment well. We reviewed supine exercises for home. She tolerated well. She reported minor right knee pain with hip abduction but it did not last.  We also reviewed technique with sit to stand transfers. We reviewed and updated her HEP.    OBJECTIVE IMPAIRMENTS: Abnormal gait, decreased activity tolerance, decreased endurance, decreased mobility, difficulty walking, decreased ROM, decreased strength, impaired flexibility, and pain.   ACTIVITY LIMITATIONS: standing, stairs, transfers, bed mobility, dressing, and locomotion level  PARTICIPATION LIMITATIONS: meal prep, cleaning, laundry, shopping, community activity, and yard work  PERSONAL FACTORS: Age and 1-2 comorbidities: right knee replacement, scoliosis  are also affecting patient's functional outcome.   REHAB POTENTIAL: Good  CLINICAL DECISION MAKING: Evolving/moderate complexity  EVALUATION COMPLEXITY: Moderate   GOALS: Goals reviewed with patient? Yes  SHORT TERM GOALS: Target date: 09/02/2022   Patient will increase passive hip flexion by 10 degrees Baseline: Goal status: INITIAL  2.  Patient will increase passive hip external rotation by 20 degrees Baseline:  Goal status: INITIAL  3.  Patient will transfer sit to stand without use of hands Baseline:  Goal status: INITIAL  4.  Patient will be independent with basic HEP Baseline:  Goal status: INITIAL   LONG TERM GOALS: Target date: 09/23/2022    Patient will put pants on without difficulty Baseline:  Goal status: INITIAL  2.  Patient will go up and down 4 steps without difficulty Baseline:  Goal status: INITIAL  3.  Patient will ambulate 2000 feet in the community without self-report of decreased balance or safety Baseline:  Goal status: INITIAL  PLAN:  PT FREQUENCY: 2x/week  PT DURATION: 6 weeks  PLANNED INTERVENTIONS: Therapeutic exercises, Therapeutic activity, Neuromuscular re-education, Balance training, Gait training, Patient/Family education, Self Care, Joint mobilization, Stair training, DME instructions, Aquatic Therapy, Electrical stimulation, Cryotherapy, Moist heat, Taping, Ultrasound, and Manual therapy  PLAN FOR NEXT SESSION: Consider passive range  of motion into  flexion and external rotation.  Begin strengthening of hip muscles in different positions.  Review HEP.  Updated HEP.  Consider balance exercises.  Referring diagnosis?  R26.89 (ICD-10-CM) - Other abnormalities of gait and mobility  Z96.659 (ICD-10-CM) - Presence of unspecified artificial knee joint  Z98.890 (ICD-10-CM) - Other specified postprocedural states   Treatment diagnosis? (if different than referring diagnosis)   What was this (referring dx) caused by? '[x]'$  Surgery '[]'$  Fall '[]'$  Ongoing issue '[]'$  Arthritis '[]'$  Other: ____________  Laterality: '[]'$  Rt '[x]'$  Lt '[]'$  Both  Check all possible CPT codes:  *CHOOSE 10 OR LESS*    '[]'$  97110 (Therapeutic Exercise)  '[]'$  92507 (SLP Treatment)  '[]'$  97112 (Neuro Re-ed)   '[]'$  92526 (Swallowing Treatment)   '[]'$  97116 (Gait Training)   '[]'$  [id number] (Cognitive Training, 1st 15 minutes) '[]'$  97140 (Manual Therapy)   '[]'$  97130 (Cognitive Training, each add'l 15 minutes)  '[]'$  97164 (Re-evaluation)                              '[]'$  Other, List CPT Code ____________  '[]'$  64680 (Therapeutic Activities)     '[]'$  32122 (Self Care)   '[x]'$  All codes above (97110 - 97535)  '[]'$  97012 (Mechanical Traction)  '[x]'$  97014 (E-stim Unattended)  '[]'$  97032 (E-stim manual)  '[]'$  97033 (Ionto)  '[]'$  97035 (Ultrasound) '[]'$  48250 (Physical Performance Training) '[]'$  [id number] (Aquatic Therapy) '[]'$  97016 (Vasopneumatic Device) '[]'$  [id number] (Paraffin) '[]'$  97034 (Contrast Bath) '[]'$  97597 (Wound Care 1st 20 sq cm) '[]'$  97598 (Wound Care each add'l 20 sq cm) '[]'$  97760 (Orthotic Fabrication, Fitting, Training Initial) '[]'$  [id number] (Prosthetic Management and Training Initial) '[]'$  [id number] (Orthotic or Prosthetic Training/ Modification Subsequent)   Carney Living, PT 08/20/2022, 10:06 AM

## 2022-08-26 ENCOUNTER — Ambulatory Visit (HOSPITAL_BASED_OUTPATIENT_CLINIC_OR_DEPARTMENT_OTHER): Payer: Medicare PPO | Admitting: Physical Therapy

## 2022-08-26 ENCOUNTER — Encounter (HOSPITAL_BASED_OUTPATIENT_CLINIC_OR_DEPARTMENT_OTHER): Payer: Self-pay | Admitting: Physical Therapy

## 2022-08-26 DIAGNOSIS — M25652 Stiffness of left hip, not elsewhere classified: Secondary | ICD-10-CM | POA: Diagnosis not present

## 2022-08-26 DIAGNOSIS — R2689 Other abnormalities of gait and mobility: Secondary | ICD-10-CM

## 2022-08-26 DIAGNOSIS — Z9889 Other specified postprocedural states: Secondary | ICD-10-CM | POA: Diagnosis not present

## 2022-08-26 DIAGNOSIS — M6281 Muscle weakness (generalized): Secondary | ICD-10-CM

## 2022-08-26 DIAGNOSIS — Z96659 Presence of unspecified artificial knee joint: Secondary | ICD-10-CM | POA: Diagnosis not present

## 2022-08-26 NOTE — Therapy (Signed)
OUTPATIENT PHYSICAL THERAPY LOWER EXTREMITY EVALUATION   Patient Name: Terri Ortega MRN: [id number] DOB:05-24-1941, 82 y.o., female Today's Date: 08/20/2022  END OF SESSION:    Past Medical History:  Diagnosis Date   Graves disease    HTN (hypertension)    Low back pain    Dr. Ronnald Ramp   Osteoporosis    Pneumonia 2012   Radiation 03/20/14, 03/27/14, 04/05/14, 04/12/14, 04/17/14   bracytherapy to proximal vagina 30 gray   Scoliosis    Uterine cancer ([place])    surgery and radiation x 5 treatments-last 9'15   Vitamin D deficiency    Past Surgical History:  Procedure Laterality Date   BUNIONECTOMY     left   CATARACT EXTRACTION, BILATERAL Bilateral    last done 11-21-14   COLONOSCOPY WITH PROPOFOL N/A 12/25/2014   Procedure: COLONOSCOPY WITH PROPOFOL;  Surgeon: Garlan Fair, MD;  Location: [facility];  Service: Endoscopy;  Laterality: N/A;   FOOT SURGERY Left 2004   Dr Johnnye Sima   JOINT REPLACEMENT     RTKA   ROBOTIC ASSISTED TOTAL HYSTERECTOMY WITH BILATERAL SALPINGO OOPHERECTOMY  01/30/14   with lymph node biopsy   TOTAL HIP ARTHROPLASTY Left 04/17/2022   Procedure: TOTAL HIP ARTHROPLASTY ANTERIOR APPROACH;  Surgeon: Rod Can, MD;  Location: [facility];  Service: Orthopedics;  Laterality: Left;   TOTAL KNEE ARTHROPLASTY     right   TOTAL KNEE REVISION Right 01/04/2019   Procedure: TOTAL KNEE REVISION;  Surgeon: Gaynelle Arabian, MD;  [facility];  Service: Orthopedics;  Laterality: Right;  171mn with block   Patient Active Problem List   Diagnosis Date Noted   Nausea 04/27/2022   Blood loss anemia 04/24/2022   Slow transit constipation 04/24/2022   Subcapital fracture of femur, left, closed, initial encounter ([facility] 04/15/2022   Fall at home, initial encounter 04/15/2022   CKD (chronic kidney disease) stage 3, GFR 30-59 ml/min ([facility] 04/15/2022   Hypertensive urgency 04/15/2022   Osteoporosis 04/15/2022   Aortic valve disease 07/10/2021   Dilated  cardiomyopathy ([facility] 12/14/2020   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   Orthostatic dizziness 11/14/2020   Educated about COVID-19 virus infection 12/21/2019   Mild cognitive impairment 09/06/2019   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[facility] [place] 01/04/2019   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 ([facility] 01/04/2019   Preop cardiovascular exam 12/20/2018   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   Uterine cancer ([facility] 03/28/2018   Arthralgia of hip or thigh 03/28/2018   Benign essential HTN 03/28/2018   Overweight 03/28/2018   Heart disease 11/09/2014   Leukocytosis 054/00/8676  Diastolic congestive heart failure ([facility] 10/30/2014   Hypothyroidism 10/30/2014   Chest pain at rest 10/30/2014   Chest pain 10/30/2014   Endometrial cancer ([facility] 01/30/2014   Dyspnea 01/11/2014   Edema 01/11/2014   Low back pain with right-sided sciatica 12/13/2013   Degeneration of lumbar or lumbosacral intervertebral disc 12/13/2013   Sciatica 12/13/2013    PCP: CLawerance CruelMD  REFERRING PROVIDER: SNemiah CommanderNP  REFERRING DIAG:  Diagnosis  R26.89 (ICD-10-CM) - Other abnormalities of gait and mobility  Z96.659 (ICD-10-CM) - Presence of unspecified artificial knee joint  Z98.890 (ICD-10-CM) - Other specified postprocedural states    THERAPY DIAG:  No diagnosis found.  Rationale for Evaluation and Treatment: Rehabilitation  ONSET DATE: 9/14 Left  hip arthroplasty following fall and fx  SUBJECTIVE:   SUBJECTIVE STATEMENT:  The patient has no complaints with her hip today.   Eval: Patient is a  82 year old female status post fall and hip fracture on 04/17/2022.  She had an anterior total hip arthroplasty on 9/14.  She went to a subsequent rehabilitation  followed by home health physical therapy.  She currently feels like she is unsteady on her feet.  She is using a cane for primary mobility outside the home.  She is using a walker inside the home.  She has not had any falls.   Therapy spoke with her daughter who feels like her general mobility has declined since her fall.  PERTINENT HISTORY:  PAIN:  Are you having pain? Yes: NPRS scale: 0/10 No real Pain at this tine.  Pain location: left hip at times  Pain description: aching  Aggravating factors: standing and walking  Relieving factors: rest   PRECAUTIONS: None  WEIGHT BEARING RESTRICTIONS: No  FALLS:  Has patient fallen in last 6 months? Yes. Number of falls 1 resulting in a hip fracture.   LIVING ENVIRONMENT: 4 steps in the front door  OCCUPATION:   Hobbies: Nothing specific   PLOF: Independent  PATIENT GOALS:  Put pants on.   NEXT MD VISIT:   OBJECTIVE:   DIAGNOSTIC FINDINGS:   PATIENT SURVEYS:  FOTO    COGNITION: Overall cognitive status: Within functional limits for tasks assessed     SENSATION: WFL  EDEMA:  Has noticed some swelling following the fall  MUSCLE LENGTH:  POSTURE: Forward head and rounded shoulders  PALPATION:   LOWER EXTREMITY ROM:  Passive ROM Right eval Left eval  Hip flexion    Hip extension    Hip abduction    Hip adduction    Hip internal rotation  15  Hip external rotation  10  Knee flexion    Knee extension    Ankle dorsiflexion    Ankle plantarflexion    Ankle inversion    Ankle eversion     (Blank rows = not tested)  LOWER EXTREMITY MMT:  MMT Right eval Left eval  Hip flexion 19.9 12.2  Hip extension    Hip abduction 16.7 10.8  Hip adduction    Hip internal rotation    Hip external rotation    Knee flexion    Knee extension 20.7 22.5  Ankle dorsiflexion    Ankle plantarflexion    Ankle inversion    Ankle eversion     (Blank rows = not tested)   FUNCTIONAL TESTS:  Sit to stand  GAIT:  Significant lateral shift to the left in left weight bearing, decreased left hip flexion.   Balance:  Tandem stance: left foot back min to mod a  Right foot back CGA to min a   Narrow base I eyes open  Narrow base CGA eyes  closed     TODAY'S TREATMENT:                                                                                                                              DATE:  1/24 Supine exercises:  LTR 2x10  Supine heel slide  2x10  Supine hip abduction 2x15 red  SAQ 2x15 left 1x15 left  Standing weight shdft x20  Standing low march 1 leg at a time 15x with cuing not to shift     Air-ex:  Normal base eyes open and closed 2x20 sec hold     1/18 Nu-step 5 min  Manual: PROM into flexion ER and IR with emphasis on ER   Supine exercises:  LTR x10  Supine march 2x10  Supine hip abduction 2x10  PPT with mod cuing x10   SAQ 2x10   Sit tot stand 3x and 5x min cuing for the first trial none the second  Standing heel raise x20     Eval Access Code: [email or web address] URL: [email or web address] Date: 08/12/2022 Prepared by: Carolyne Littles  Exercises - Seated Knee Extension with Resistance  - 1 x daily - 7 x weekly - 3 sets - 10 reps - Seated Hip Abduction with Resistance  - 1 x daily - 7 x weekly - 3 sets - 10 reps - Seated Knee Lifts with Resistance  - 1 x daily - 7 x weekly - 3 sets - 10 reps  PATIENT EDUCATION:  Education details: HEP, symptom management, safety with ambulation  Person educated: Patient Education method: Explanation, Demonstration, Tactile cues, Verbal cues, and Handouts Education comprehension: verbalized understanding, returned demonstration, verbal cues required, tactile cues required, and needs further education  HOME EXERCISE PROGRAM: Access Code: [email or web address] URL: [email or web address] Date: 08/12/2022 Prepared by: Carolyne Littles  Exercises - Seated Knee Extension with Resistance  - 1 x daily - 7 x weekly - 3 sets - 10 reps - Seated Hip Abduction with Resistance  - 1 x daily - 7 x weekly - 3 sets - 10 reps - Seated Knee Lifts with Resistance  - 1 x daily - 7 x weekly - 3 sets - 10 reps  ASSESSMENT:  CLINICAL IMPRESSION: The patient  is doing well. Her ER is still limited  She has had no pain with light manual therapy. We added in standing there-ex as well as balance exercises. She had no significant pain. Therapy will continue to progress as tolerated.    OBJECTIVE IMPAIRMENTS: Abnormal gait, decreased activity tolerance, decreased endurance, decreased mobility, difficulty walking, decreased ROM, decreased strength, impaired flexibility, and pain.   ACTIVITY LIMITATIONS: standing, stairs, transfers, bed mobility, dressing, and locomotion level  PARTICIPATION LIMITATIONS: meal prep, cleaning, laundry, shopping, community activity, and yard work  PERSONAL FACTORS: Age and 1-2 comorbidities: right knee replacement, scoliosis  are also affecting patient's functional outcome.   REHAB POTENTIAL: Good  CLINICAL DECISION MAKING: Evolving/moderate complexity  EVALUATION COMPLEXITY: Moderate   GOALS: Goals reviewed with patient? Yes  SHORT TERM GOALS: Target date: 09/02/2022   Patient will increase passive hip flexion by 10 degrees Baseline: Goal status: INITIAL  2.  Patient will increase passive hip external rotation by 20 degrees Baseline:  Goal status: INITIAL  3.  Patient will transfer sit to stand without use of hands Baseline:  Goal status: INITIAL  4.  Patient will be independent with basic HEP Baseline:  Goal status: INITIAL   LONG TERM GOALS: Target date: 09/23/2022    Patient will put pants on without difficulty Baseline:  Goal status: INITIAL  2.  Patient will go up and down 4 steps without difficulty Baseline:  Goal status: INITIAL  3.  Patient will ambulate 2000 feet in the community without self-report of decreased balance or  safety Baseline:  Goal status: INITIAL  PLAN:  PT FREQUENCY: 2x/week  PT DURATION: 6 weeks  PLANNED INTERVENTIONS: Therapeutic exercises, Therapeutic activity, Neuromuscular re-education, Balance training, Gait training, Patient/Family education, Self Care,  Joint mobilization, Stair training, DME instructions, Aquatic Therapy, Electrical stimulation, Cryotherapy, Moist heat, Taping, Ultrasound, and Manual therapy  PLAN FOR NEXT SESSION: Consider passive range of motion into flexion and external rotation.  Begin strengthening of hip muscles in different positions.  Review HEP.  Updated HEP.  Consider balance exercises.  Referring diagnosis?  R26.89 (ICD-10-CM) - Other abnormalities of gait and mobility  Z96.659 (ICD-10-CM) - Presence of unspecified artificial knee joint  Z98.890 (ICD-10-CM) - Other specified postprocedural states   Treatment diagnosis? (if different than referring diagnosis)   What was this (referring dx) caused by? '[x]'$  Surgery '[]'$  Fall '[]'$  Ongoing issue '[]'$  Arthritis '[]'$  Other: ____________  Laterality: '[]'$  Rt '[x]'$  Lt '[]'$  Both  Check all possible CPT codes:  *CHOOSE 10 OR LESS*    '[]'$  97110 (Therapeutic Exercise)  '[]'$  92507 (SLP Treatment)  '[]'$  97112 (Neuro Re-ed)   '[]'$  92526 (Swallowing Treatment)   '[]'$  97116 (Gait Training)   '[]'$  [id number] (Cognitive Training, 1st 15 minutes) '[]'$  97140 (Manual Therapy)   '[]'$  97130 (Cognitive Training, each add'l 15 minutes)  '[]'$  97164 (Re-evaluation)                              '[]'$  Other, List CPT Code ____________  '[]'$  54656 (Therapeutic Activities)     '[]'$  97535 (Self Care)   '[x]'$  All codes above (97110 - 97535)  '[]'$  97012 (Mechanical Traction)  '[x]'$  97014 (E-stim Unattended)  '[]'$  97032 (E-stim manual)  '[]'$  97033 (Ionto)  '[]'$  97035 (Ultrasound) '[]'$  97750 (Physical Performance Training) '[]'$  [id number] (Aquatic Therapy) '[]'$  97016 (Vasopneumatic Device) '[]'$  [id number] (Paraffin) '[]'$  97034 (Contrast Bath) '[]'$  97597 (Wound Care 1st 20 sq cm) '[]'$  97598 (Wound Care each add'l 20 sq cm) '[]'$  97760 (Orthotic Fabrication, Fitting, Training Initial) '[]'$  [id number] (Prosthetic Management and Training Initial) '[]'$  [id number] (Orthotic or Prosthetic Training/ Modification Subsequent)   Carney Living, PT 08/20/2022, 10:06 AM

## 2022-09-03 ENCOUNTER — Ambulatory Visit (HOSPITAL_BASED_OUTPATIENT_CLINIC_OR_DEPARTMENT_OTHER): Payer: Medicare PPO | Attending: Family Medicine | Admitting: Physical Therapy

## 2022-09-03 ENCOUNTER — Encounter (HOSPITAL_BASED_OUTPATIENT_CLINIC_OR_DEPARTMENT_OTHER): Payer: Self-pay | Admitting: Physical Therapy

## 2022-09-03 DIAGNOSIS — R2689 Other abnormalities of gait and mobility: Secondary | ICD-10-CM | POA: Diagnosis not present

## 2022-09-03 DIAGNOSIS — R262 Difficulty in walking, not elsewhere classified: Secondary | ICD-10-CM | POA: Diagnosis not present

## 2022-09-03 DIAGNOSIS — M6281 Muscle weakness (generalized): Secondary | ICD-10-CM | POA: Insufficient documentation

## 2022-09-03 DIAGNOSIS — R2681 Unsteadiness on feet: Secondary | ICD-10-CM | POA: Diagnosis not present

## 2022-09-03 DIAGNOSIS — M25652 Stiffness of left hip, not elsewhere classified: Secondary | ICD-10-CM | POA: Insufficient documentation

## 2022-09-03 NOTE — Therapy (Signed)
OUTPATIENT PHYSICAL THERAPY LOWER EXTREMITY EVALUATION   Patient Name: Terri Ortega MRN: [id number] DOB:1940-08-21, 82 y.o., female Today's Date: 09/03/2022  END OF SESSION:  PT End of Session - 09/03/22 0952     Visit Number 4    Number of Visits 12    Date for PT Re-Evaluation 09/23/22    PT Start Time 0930    PT Stop Time 4081    PT Time Calculation (min) 44 min    Activity Tolerance Patient tolerated treatment well    Behavior During Therapy [facility] for tasks assessed/performed              Past Medical History:  Diagnosis Date   Graves disease    HTN (hypertension)    Low back pain    Dr. Ronnald Ramp   Osteoporosis    Pneumonia 2012   Radiation 03/20/14, 03/27/14, 04/05/14, 04/12/14, 04/17/14   bracytherapy to proximal vagina 30 gray   Scoliosis    Uterine cancer ([place])    surgery and radiation x 5 treatments-last 9'15   Vitamin D deficiency    Past Surgical History:  Procedure Laterality Date   BUNIONECTOMY     left   CATARACT EXTRACTION, BILATERAL Bilateral    last done 11-21-14   COLONOSCOPY WITH PROPOFOL N/A 12/25/2014   Procedure: COLONOSCOPY WITH PROPOFOL;  Surgeon: Garlan Fair, MD;  [facility];  Service: Endoscopy;  Laterality: N/A;   FOOT SURGERY Left 2004   Dr Johnnye Sima   JOINT REPLACEMENT     RTKA   ROBOTIC ASSISTED TOTAL HYSTERECTOMY WITH BILATERAL SALPINGO OOPHERECTOMY  01/30/14   with lymph node biopsy   TOTAL HIP ARTHROPLASTY Left 04/17/2022   Procedure: TOTAL HIP ARTHROPLASTY ANTERIOR APPROACH;  Surgeon: Rod Can, MD;  Location: [facility];  Service: Orthopedics;  Laterality: Left;   TOTAL KNEE ARTHROPLASTY     right   TOTAL KNEE REVISION Right 01/04/2019   Procedure: TOTAL KNEE REVISION;  Surgeon: Gaynelle Arabian, MD;  [facility];  Service: Orthopedics;  Laterality: Right;  174mn with block   Patient Active Problem List   Diagnosis Date Noted   Nausea 04/27/2022   Blood loss anemia 04/24/2022   Slow transit constipation  04/24/2022   Subcapital fracture of femur, left, closed, initial encounter ([facility] 04/15/2022   Fall at home, initial encounter 04/15/2022   CKD (chronic kidney disease) stage 3, GFR 30-59 ml/min ([facility] 04/15/2022   Hypertensive urgency 04/15/2022   Osteoporosis 04/15/2022   Aortic valve disease 07/10/2021   Dilated cardiomyopathy ([facility] [place] 12/14/2020   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   Orthostatic dizziness 11/14/2020   Educated about COVID-19 virus infection 12/21/2019   Mild cognitive impairment 09/06/2019   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[facility] 01/04/2019   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 ([facility] 01/04/2019   Preop cardiovascular exam 12/20/2018   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   Uterine cancer ([facility] [place] 03/28/2018   Arthralgia of hip or thigh 03/28/2018   Benign essential HTN 03/28/2018   Overweight 03/28/2018   Heart disease 11/09/2014   Leukocytosis 044/81/8563  Diastolic congestive heart failure ([facility] 10/30/2014   Hypothyroidism 10/30/2014   Chest pain at rest 10/30/2014   Chest pain 10/30/2014   [facility]ometrial cancer ([facility] 01/30/2014   Dyspnea 01/11/2014   Edema 01/11/2014   Low back pain with right-sided sciatica 12/13/2013   Degeneration of lumbar or lumbosacral intervertebral disc 12/13/2013   Sciatica 12/13/2013    PCP: CLawerance CruelMD  REFERRING PROVIDER: SNemiah CommanderNP  REFERRING DIAG:  Diagnosis  R26.89 (ICD-10-CM) - Other abnormalities of gait and mobility  Z96.659 (ICD-10-CM) - Presence of unspecified artificial knee joint  Z98.890 (ICD-10-CM) - Other specified postprocedural states    THERAPY DIAG:  Other abnormalities of gait and mobility  Muscle weakness (generalized)  Stiffness of left hip, not elsewhere classified  Rationale for Evaluation and Treatment: Rehabilitation  ONSET DATE: 9/14 Left  hip arthroplasty following fall and fx  SUBJECTIVE:   SUBJECTIVE STATEMENT:  The patient reports minor pain when  she is trying to lift her leg to put her pants on. She is otherwise tolerating treatment well.   Eval: Patient is a 82 year old female status post fall and hip fracture on 04/17/2022.  She had an anterior total hip arthroplasty on 9/14.  She went to a subsequent rehabilitation  followed by home health physical therapy.  She currently feels like she is unsteady on her feet.  She is using a cane for primary mobility outside the home.  She is using a walker inside the home.  She has not had any falls.  Therapy spoke with her daughter who feels like her general mobility has declined since her fall.  PERTINENT HISTORY:  PAIN:  Are you having pain? Yes: NPRS scale: 0/10 No real Pain at this tine.  Pain location: left hip at times  Pain description: aching  Aggravating factors: standing and walking  Relieving factors: rest   PRECAUTIONS: None  WEIGHT BEARING RESTRICTIONS: No  FALLS:  Has patient fallen in last 6 months? Yes. Number of falls 1 resulting in a hip fracture.   LIVING ENVIRONMENT: 4 steps in the front door  OCCUPATION:   Hobbies: Nothing specific   PLOF: Independent  PATIENT GOALS:  Put pants on.   NEXT MD VISIT:   OBJECTIVE:   DIAGNOSTIC FINDINGS:   PATIENT SURVEYS:  FOTO    COGNITION: Overall cognitive status: Within functional limits for tasks assessed     SENSATION: WFL  EDEMA:  Has noticed some swelling following the fall  MUSCLE LENGTH:  POSTURE: Forward head and rounded shoulders  PALPATION:   LOWER EXTREMITY ROM:  Passive ROM Right eval Left eval  Hip flexion    Hip extension    Hip abduction    Hip adduction    Hip internal rotation  15  Hip external rotation  10  Knee flexion    Knee extension    Ankle dorsiflexion    Ankle plantarflexion    Ankle inversion    Ankle eversion     (Blank rows = not tested)  LOWER EXTREMITY MMT:  MMT Right eval Left eval  Hip flexion 19.9 12.2  Hip extension    Hip abduction 16.7 10.8  Hip  adduction    Hip internal rotation    Hip external rotation    Knee flexion    Knee extension 20.7 22.5  Ankle dorsiflexion    Ankle plantarflexion    Ankle inversion    Ankle eversion     (Blank rows = not tested)   FUNCTIONAL TESTS:  Sit to stand  GAIT:  Significant lateral shift to the left in left weight bearing, decreased left hip flexion.   Balance:  Tandem stance: left foot back min to mod a  Right foot back CGA to min a   Narrow base I eyes open  Narrow base CGA eyes closed     TODAY'S TREATMENT:  DATE:  2/1  Manual: ER stretching; manual therapy to the anterior hip; trigger point release to the anterior hip.   LTRx20  Supine march x20  Supine hip abduction red 2x15  Supine bridge 2x10    Standing weight shift x20    Air-ex:  Normal base eyes open and closed 2x20 sec hold     1/24 Supine exercises:  LTR 2x10  Supine heel slide  2x10  Supine hip abduction 2x15 red  SAQ 2x15 left 1x15 left  Standing weight shdft x20  Standing low march 1 leg at a time 15x with cuing not to shift     Air-ex:  Normal base eyes open and closed 2x20 sec hold     1/18 Nu-step 5 min  Manual: PROM into flexion ER and IR with emphasis on ER   Supine exercises:  LTR x10  Supine march 2x10  Supine hip abduction 2x10  PPT with mod cuing x10   SAQ 2x10   Sit tot stand 3x and 5x min cuing for the first trial none the second  Standing heel raise x20     Eval Access Code: [email or web address] URL: [email or web address] Date: 08/12/2022 Prepared by: Carolyne Littles  Exercises - Seated Knee Extension with Resistance  - 1 x daily - 7 x weekly - 3 sets - 10 reps - Seated Hip Abduction with Resistance  - 1 x daily - 7 x weekly - 3 sets - 10 reps - Seated Knee Lifts with Resistance  - 1 x daily - 7 x weekly - 3 sets - 10 reps  PATIENT  EDUCATION:  Education details: HEP, symptom management, safety with ambulation  Person educated: Patient Education method: Explanation, Demonstration, Tactile cues, Verbal cues, and Handouts Education comprehension: verbalized understanding, returned demonstration, verbal cues required, tactile cues required, and needs further education  HOME EXERCISE PROGRAM: Access Code: [email or web address] URL: [email or web address] Date: 08/12/2022 Prepared by: Carolyne Littles  Exercises - Seated Knee Extension with Resistance  - 1 x daily - 7 x weekly - 3 sets - 10 reps - Seated Hip Abduction with Resistance  - 1 x daily - 7 x weekly - 3 sets - 10 reps - Seated Knee Lifts with Resistance  - 1 x daily - 7 x weekly - 3 sets - 10 reps  ASSESSMENT:  CLINICAL IMPRESSION: The patient had some tightness in her anterior hip and soreness in her anterior hip with palpation. We reviewed self softt tissue mobilization and mobilization and performed a roller to the area. We also performed an anterior hip stretch in side lying. After she was able to do a supine march without increased pain. Overall she is making progress. We will continue to progress as tolerated.   OBJECTIVE IMPAIRMENTS: Abnormal gait, decreased activity tolerance, decreased endurance, decreased mobility, difficulty walking, decreased ROM, decreased strength, impaired flexibility, and pain.   ACTIVITY LIMITATIONS: standing, stairs, transfers, bed mobility, dressing, and locomotion level  PARTICIPATION LIMITATIONS: meal prep, cleaning, laundry, shopping, community activity, and yard work  PERSONAL FACTORS: Age and 1-2 comorbidities: right knee replacement, scoliosis  are also affecting patient's functional outcome.   REHAB POTENTIAL: Good  CLINICAL DECISION MAKING: Evolving/moderate complexity  EVALUATION COMPLEXITY: Moderate   GOALS: Goals reviewed with patient? Yes  SHORT TERM GOALS: Target date: 09/02/2022   Patient will increase  passive hip flexion by 10 degrees Baseline: Goal status: INITIAL  2.  Patient will increase passive hip external rotation by 20 degrees Baseline:  Goal status: INITIAL  3.  Patient will  transfer sit to stand without use of hands Baseline:  Goal status: INITIAL  4.  Patient will be independent with basic HEP Baseline:  Goal status: INITIAL   LONG TERM GOALS: Target date: 09/23/2022    Patient will put pants on without difficulty Baseline:  Goal status: INITIAL  2.  Patient will go up and down 4 steps without difficulty Baseline:  Goal status: INITIAL  3.  Patient will ambulate 2000 feet in the community without self-report of decreased balance or safety Baseline:  Goal status: INITIAL  PLAN:  PT FREQUENCY: 2x/week  PT DURATION: 6 weeks  PLANNED INTERVENTIONS: Therapeutic exercises, Therapeutic activity, Neuromuscular re-education, Balance training, Gait training, Patient/Family education, Self Care, Joint mobilization, Stair training, DME instructions, Aquatic Therapy, Electrical stimulation, Cryotherapy, Moist heat, Taping, Ultrasound, and Manual therapy  PLAN FOR NEXT SESSION: Consider passive range of motion into flexion and external rotation.  Begin strengthening of hip muscles in different positions.  Review HEP.  Updated HEP.  Consider balance exercises.  Referring diagnosis?  R26.89 (ICD-10-CM) - Other abnormalities of gait and mobility  Z96.659 (ICD-10-CM) - Presence of unspecified artificial knee joint  Z98.890 (ICD-10-CM) - Other specified postprocedural states   Treatment diagnosis? (if different than referring diagnosis)   What was this (referring dx) caused by? '[x]'$  Surgery '[]'$  Fall '[]'$  Ongoing issue '[]'$  Arthritis '[]'$  Other: ____________  Laterality: '[]'$  Rt '[x]'$  Lt '[]'$  Both  Check all possible CPT codes:  *CHOOSE 10 OR LESS*    '[]'$  97110 (Therapeutic Exercise)  '[]'$  92507 (SLP Treatment)  '[]'$  97112 (Neuro Re-ed)   '[]'$  92526 (Swallowing Treatment)   '[]'$   97116 (Gait Training)   '[]'$  [id number] (Cognitive Training, 1st 15 minutes) '[]'$  97140 (Manual Therapy)   '[]'$  97130 (Cognitive Training, each add'l 15 minutes)  '[]'$  97164 (Re-evaluation)                              '[]'$  Other, List CPT Code ____________  '[]'$  13244 (Therapeutic Activities)     '[]'$  97535 (Self Care)   '[x]'$  All codes above (97110 - 97535)  '[]'$  97012 (Mechanical Traction)  '[x]'$  97014 (E-stim Unattended)  '[]'$  97032 (E-stim manual)  '[]'$  97033 (Ionto)  '[]'$  97035 (Ultrasound) '[]'$  97750 (Physical Performance Training) '[]'$  [id number] (Aquatic Therapy) '[]'$  97016 (Vasopneumatic Device) '[]'$  [id number] (Paraffin) '[]'$  97034 (Contrast Bath) '[]'$  97597 (Wound Care 1st 20 sq cm) '[]'$  97598 (Wound Care each add'l 20 sq cm) '[]'$  97760 (Orthotic Fabrication, Fitting, Training Initial) '[]'$  [id number] (Prosthetic Management and Training Initial) '[]'$  [id number] (Orthotic or Prosthetic Training/ Modification Subsequent)   Carney Living, PT 09/03/2022, 10:47 AM

## 2022-09-07 ENCOUNTER — Ambulatory Visit (HOSPITAL_BASED_OUTPATIENT_CLINIC_OR_DEPARTMENT_OTHER): Payer: Medicare PPO | Admitting: Physical Therapy

## 2022-09-07 DIAGNOSIS — R2681 Unsteadiness on feet: Secondary | ICD-10-CM | POA: Diagnosis not present

## 2022-09-07 DIAGNOSIS — R262 Difficulty in walking, not elsewhere classified: Secondary | ICD-10-CM | POA: Diagnosis not present

## 2022-09-07 DIAGNOSIS — R2689 Other abnormalities of gait and mobility: Secondary | ICD-10-CM

## 2022-09-07 DIAGNOSIS — M25652 Stiffness of left hip, not elsewhere classified: Secondary | ICD-10-CM | POA: Diagnosis not present

## 2022-09-07 DIAGNOSIS — M6281 Muscle weakness (generalized): Secondary | ICD-10-CM | POA: Diagnosis not present

## 2022-09-07 NOTE — Therapy (Signed)
OUTPATIENT PHYSICAL THERAPY LOWER EXTREMITY EVALUATION   Patient Name: Terri Ortega MRN: [id number] DOB:1940/10/09, 82 y.o., female Today's Date: 09/03/2022  END OF SESSION:  PT End of Session - 09/03/22 0952     Visit Number 4    Number of Visits 12    Date for PT Re-Evaluation 09/23/22    PT Start Time 0930    PT Stop Time 0998    PT Time Calculation (min) 44 min    Activity Tolerance Patient tolerated treatment well    Behavior During Therapy [facility] for tasks assessed/performed              Past Medical History:  Diagnosis Date   Graves disease    HTN (hypertension)    Low back pain    Dr. Ronnald Ramp   Osteoporosis    Pneumonia 2012   Radiation 03/20/14, 03/27/14, 04/05/14, 04/12/14, 04/17/14   bracytherapy to proximal vagina 30 gray   Scoliosis    Uterine cancer ([place])    surgery and radiation x 5 treatments-last 9'15   Vitamin D deficiency    Past Surgical History:  Procedure Laterality Date   BUNIONECTOMY     left   CATARACT EXTRACTION, BILATERAL Bilateral    last done 11-21-14   COLONOSCOPY WITH PROPOFOL N/A 12/25/2014   Procedure: COLONOSCOPY WITH PROPOFOL;  Surgeon: Garlan Fair, MD;  Location: [facility];  Service: Endoscopy;  Laterality: N/A;   FOOT SURGERY Left 2004   Dr Johnnye Sima   JOINT REPLACEMENT     RTKA   ROBOTIC ASSISTED TOTAL HYSTERECTOMY WITH BILATERAL SALPINGO OOPHERECTOMY  01/30/14   with lymph node biopsy   TOTAL HIP ARTHROPLASTY Left 04/17/2022   Procedure: TOTAL HIP ARTHROPLASTY ANTERIOR APPROACH;  Surgeon: Rod Can, MD;  Location: [place];  Service: Orthopedics;  Laterality: Left;   TOTAL KNEE ARTHROPLASTY     right   TOTAL KNEE REVISION Right 01/04/2019   Procedure: TOTAL KNEE REVISION;  Surgeon: Gaynelle Arabian, MD;  Location: [facility];  Service: Orthopedics;  Laterality: Right;  170mn with block   Patient Active Problem List   Diagnosis Date Noted   Nausea 04/27/2022   Blood loss anemia 04/24/2022   Slow transit constipation  04/24/2022   Subcapital fracture of femur, left, closed, [place] 04/15/2022   Fall at home, initial encounter 04/15/2022   CKD (chronic kidney disease) stage 3, GFR 30-59 ml/min ([place] 04/15/2022   Hypertensive urgency 04/15/2022   Osteoporosis 04/15/2022   Aortic valve disease 07/10/2021   Dilated cardiomyopathy ([place] 12/14/2020   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   Orthostatic dizziness 11/14/2020   Educated about COVID-19 virus infection 12/21/2019   Mild cognitive impairment 09/06/2019   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[place] 01/04/2019   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   Preop cardiovascular exam 12/20/2018   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   Uterine cancer ([place] 03/28/2018   Arthralgia of hip or thigh 03/28/2018   Benign essential HTN 03/28/2018   Overweight 03/28/2018   Heart disease 11/09/2014   Leukocytosis 033/82/5053  Diastolic congestive heart failure ([place] 10/30/2014   Hypothyroidism 10/30/2014   Chest pain at rest 10/30/2014   Chest pain 10/30/2014   Endometrial cancer ([place] 01/30/2014   Dyspnea 01/11/2014   Edema 01/11/2014   Low back pain with right-sided sciatica 12/13/2013   Degeneration of lumbar or lumbosacral intervertebral disc 12/13/2013   Sciatica 12/13/2013    PCP: CLawerance CruelMD  REFERRING PROVIDER: SNemiah CommanderNP  REFERRING DIAG:  Diagnosis  R26.89 (ICD-10-CM) - Other abnormalities of gait and mobility  Z96.659 (ICD-10-CM) - Presence of unspecified artificial knee joint  Z98.890 (ICD-10-CM) - Other specified postprocedural states    THERAPY DIAG:  Other abnormalities of gait and mobility  Muscle weakness (generalized)  Stiffness of left hip, not elsewhere classified  Rationale for Evaluation and Treatment: Rehabilitation  ONSET DATE: 9/14 Left  hip arthroplasty following fall and fx  SUBJECTIVE:   SUBJECTIVE STATEMENT:  The patient reports minor pain when  she is trying to lift her leg to put her pants on. She is otherwise tolerating treatment well.   Eval: Patient is a 82 year old female status post fall and hip fracture on 04/17/2022.  She had an anterior total hip arthroplasty on 9/14.  She went to a subsequent rehabilitation  followed by home health physical therapy.  She currently feels like she is unsteady on her feet.  She is using a cane for primary mobility outside the home.  She is using a walker inside the home.  She has not had any falls.  Therapy spoke with her daughter who feels like her general mobility has declined since her fall.  PERTINENT HISTORY:  PAIN:  Are you having pain? Yes: NPRS scale: 0/10 No real Pain at this tine.  Pain location: left hip at times  Pain description: aching  Aggravating factors: standing and walking  Relieving factors: rest   PRECAUTIONS: None  WEIGHT BEARING RESTRICTIONS: No  FALLS:  Has patient fallen in last 6 months? Yes. Number of falls 1 resulting in a hip fracture.   LIVING ENVIRONMENT: 4 steps in the front door  OCCUPATION:   Hobbies: Nothing specific   PLOF: Independent  PATIENT GOALS:  Put pants on.   NEXT MD VISIT:   OBJECTIVE:   DIAGNOSTIC FINDINGS:   PATIENT SURVEYS:  FOTO    COGNITION: Overall cognitive status: Within functional limits for tasks assessed     SENSATION: WFL  EDEMA:  Has noticed some swelling following the fall  MUSCLE LENGTH:  POSTURE: Forward head and rounded shoulders  PALPATION:   LOWER EXTREMITY ROM:  Passive ROM Right eval Left eval  Hip flexion    Hip extension    Hip abduction    Hip adduction    Hip internal rotation  15  Hip external rotation  10  Knee flexion    Knee extension    Ankle dorsiflexion    Ankle plantarflexion    Ankle inversion    Ankle eversion     (Blank rows = not tested)  LOWER EXTREMITY MMT:  MMT Right eval Left eval  Hip flexion 19.9 12.2  Hip extension    Hip abduction 16.7 10.8  Hip  adduction    Hip internal rotation    Hip external rotation    Knee flexion    Knee extension 20.7 22.5  Ankle dorsiflexion    Ankle plantarflexion    Ankle inversion    Ankle eversion     (Blank rows = not tested)   FUNCTIONAL TESTS:  Sit to stand  GAIT:  Significant lateral shift to the left in left weight bearing, decreased left hip flexion.   Balance:  Tandem stance: left foot back min to mod a  Right foot back CGA to min a   Narrow base I eyes open  Narrow base CGA eyes closed     TODAY'S TREATMENT:  DATE:  2/6 Manual: ER stretching; manual therapy to the anterior hip; trigger point release to the anterior hip. Roller to anterior hip   LTRx20  Supine march x20  Supine hip abduction red 2x15  Supine bridge 2x10   Normal base eyes open and closed 3x20 sec hold   Step up 2x10 4 inch     2/1  Manual: ER stretching; manual therapy to the anterior hip; trigger point release to the anterior hip.   LTRx20  Supine march x20  Supine hip abduction red 2x15  Supine bridge 2x10    Standing weight shift x20    Air-ex:  Normal base eyes open and closed 2x20 sec hold     1/24 Supine exercises:  LTR 2x10  Supine heel slide  2x10  Supine hip abduction 2x15 red  SAQ 2x15 left 1x15 left  Standing weight shdft x20  Standing low march 1 leg at a time 15x with cuing not to shift     Air-ex:  Normal base eyes open and closed 2x20 sec hold     Eval Access Code: [email or web address] URL: [email or web address] Date: 08/12/2022 Prepared by: Carolyne Littles  Exercises - Seated Knee Extension with Resistance  - 1 x daily - 7 x weekly - 3 sets - 10 reps - Seated Hip Abduction with Resistance  - 1 x daily - 7 x weekly - 3 sets - 10 reps - Seated Knee Lifts with Resistance  - 1 x daily - 7 x weekly - 3 sets - 10 reps  PATIENT EDUCATION:   Education details: HEP, symptom management, safety with ambulation  Person educated: Patient Education method: Explanation, Demonstration, Tactile cues, Verbal cues, and Handouts Education comprehension: verbalized understanding, returned demonstration, verbal cues required, tactile cues required, and needs further education  HOME EXERCISE PROGRAM: Access Code: [email or web address] URL: [email or web address] Date: 08/12/2022 Prepared by: Carolyne Littles  Exercises - Seated Knee Extension with Resistance  - 1 x daily - 7 x weekly - 3 sets - 10 reps - Seated Hip Abduction with Resistance  - 1 x daily - 7 x weekly - 3 sets - 10 reps - Seated Knee Lifts with Resistance  - 1 x daily - 7 x weekly - 3 sets - 10 reps  ASSESSMENT:  CLINICAL IMPRESSION: The patient had reported in the beginning of the session some pain with steps. We initiated stair training today. She reported mild pain with stair training. Her balance is improving with eyes closed on air-ex. Per visual inspection she is having slightly improved ER. We will continue to work on tight sore spots int he front of her hip. We will continue to advance as tolerated.    OBJECTIVE IMPAIRMENTS: Abnormal gait, decreased activity tolerance, decreased endurance, decreased mobility, difficulty walking, decreased ROM, decreased strength, impaired flexibility, and pain.   ACTIVITY LIMITATIONS: standing, stairs, transfers, bed mobility, dressing, and locomotion level  PARTICIPATION LIMITATIONS: meal prep, cleaning, laundry, shopping, community activity, and yard work  PERSONAL FACTORS: Age and 1-2 comorbidities: right knee replacement, scoliosis  are also affecting patient's functional outcome.   REHAB POTENTIAL: Good  CLINICAL DECISION MAKING: Evolving/moderate complexity  EVALUATION COMPLEXITY: Moderate   GOALS: Goals reviewed with patient? Yes  SHORT TERM GOALS: Target date: 09/02/2022   Patient will increase passive hip flexion  by 10 degrees Baseline: Goal status: INITIAL  2.  Patient will increase passive hip external rotation by 20 degrees Baseline:  Goal status: INITIAL  3.  Patient will transfer sit to stand without use of  hands Baseline:  Goal status: INITIAL  4.  Patient will be independent with basic HEP Baseline:  Goal status: INITIAL   LONG TERM GOALS: Target date: 09/23/2022    Patient will put pants on without difficulty Baseline:  Goal status: INITIAL  2.  Patient will go up and down 4 steps without difficulty Baseline:  Goal status: INITIAL  3.  Patient will ambulate 2000 feet in the community without self-report of decreased balance or safety Baseline:  Goal status: INITIAL  PLAN:  PT FREQUENCY: 2x/week  PT DURATION: 6 weeks  PLANNED INTERVENTIONS: Therapeutic exercises, Therapeutic activity, Neuromuscular re-education, Balance training, Gait training, Patient/Family education, Self Care, Joint mobilization, Stair training, DME instructions, Aquatic Therapy, Electrical stimulation, Cryotherapy, Moist heat, Taping, Ultrasound, and Manual therapy  PLAN FOR NEXT SESSION: Consider passive range of motion into flexion and external rotation.  Begin strengthening of hip muscles in different positions.  Review HEP.  Updated HEP.  Consider balance exercises.  Referring diagnosis?  R26.89 (ICD-10-CM) - Other abnormalities of gait and mobility  Z96.659 (ICD-10-CM) - Presence of unspecified artificial knee joint  Z98.890 (ICD-10-CM) - Other specified postprocedural states   Treatment diagnosis? (if different than referring diagnosis)   What was this (referring dx) caused by? '[x]'$  Surgery '[]'$  Fall '[]'$  Ongoing issue '[]'$  Arthritis '[]'$  Other: ____________  Laterality: '[]'$  Rt '[x]'$  Lt '[]'$  Both  Check all possible CPT codes:  *CHOOSE 10 OR LESS*    '[]'$  97110 (Therapeutic Exercise)  '[]'$  92507 (SLP Treatment)  '[]'$  97112 (Neuro Re-ed)   '[]'$  92526 (Swallowing Treatment)   '[]'$  97116 (Gait  Training)   '[]'$  [id number] (Cognitive Training, 1st 15 minutes) '[]'$  97140 (Manual Therapy)   '[]'$  97130 (Cognitive Training, each add'l 15 minutes)  '[]'$  97164 (Re-evaluation)                              '[]'$  Other, List CPT Code ____________  '[]'$  11572 (Therapeutic Activities)     '[]'$  62035 (Self Care)   '[x]'$  All codes above (97110 - 97535)  '[]'$  97012 (Mechanical Traction)  '[x]'$  97014 (E-stim Unattended)  '[]'$  97032 (E-stim manual)  '[]'$  97033 (Ionto)  '[]'$  97035 (Ultrasound) '[]'$  97750 (Physical Performance Training) '[]'$  [id number] (Aquatic Therapy) '[]'$  97016 (Vasopneumatic Device) '[]'$  [id number] (Paraffin) '[]'$  97034 (Contrast Bath) '[]'$  97597 (Wound Care 1st 20 sq cm) '[]'$  97598 (Wound Care each add'l 20 sq cm) '[]'$  97760 (Orthotic Fabrication, Fitting, Training Initial) '[]'$  [id number] (Prosthetic Management and Training Initial) '[]'$  [id number] (Orthotic or Prosthetic Training/ Modification Subsequent)   Carney Living, PT 09/03/2022, 10:47 AM

## 2022-09-08 ENCOUNTER — Encounter (HOSPITAL_BASED_OUTPATIENT_CLINIC_OR_DEPARTMENT_OTHER): Payer: Self-pay | Admitting: Physical Therapy

## 2022-09-10 ENCOUNTER — Encounter (HOSPITAL_BASED_OUTPATIENT_CLINIC_OR_DEPARTMENT_OTHER): Payer: Self-pay | Admitting: Physical Therapy

## 2022-09-10 ENCOUNTER — Ambulatory Visit (HOSPITAL_BASED_OUTPATIENT_CLINIC_OR_DEPARTMENT_OTHER): Payer: Medicare PPO | Admitting: Physical Therapy

## 2022-09-10 DIAGNOSIS — R262 Difficulty in walking, not elsewhere classified: Secondary | ICD-10-CM | POA: Diagnosis not present

## 2022-09-10 DIAGNOSIS — R2689 Other abnormalities of gait and mobility: Secondary | ICD-10-CM

## 2022-09-10 DIAGNOSIS — M25652 Stiffness of left hip, not elsewhere classified: Secondary | ICD-10-CM | POA: Diagnosis not present

## 2022-09-10 DIAGNOSIS — M6281 Muscle weakness (generalized): Secondary | ICD-10-CM | POA: Diagnosis not present

## 2022-09-10 DIAGNOSIS — R2681 Unsteadiness on feet: Secondary | ICD-10-CM | POA: Diagnosis not present

## 2022-09-10 NOTE — Therapy (Signed)
OUTPATIENT PHYSICAL THERAPY LOWER EXTREMITY EVALUATION   Patient Name: Terri Ortega MRN: [id number] DOB:[birth year], 82 y.o., female Today's Date: 09/03/2022  END OF SESSION:  PT End of Session - 09/03/22 0952     Visit Number 4    Number of Visits 12    Date for PT Re-Evaluation 09/23/22    PT Start Time 0930    PT Stop Time 9326    PT Time Calculation (min) 44 min    Activity Tolerance Patient tolerated treatment well    Behavior During Therapy [facility] for tasks assessed/performed              Past Medical History:  Diagnosis Date   Graves disease    HTN (hypertension)    Low back pain    Dr. Ronnald Ramp   Osteoporosis    Pneumonia 2012   Radiation 03/20/14, 03/27/14, 04/05/14, 04/12/14, 04/17/14   bracytherapy to proximal vagina 30 gray   Scoliosis    Uterine cancer ([place])    surgery and radiation x 5 treatments-last 9'15   Vitamin D deficiency    Past Surgical History:  Procedure Laterality Date   BUNIONECTOMY     left   CATARACT EXTRACTION, BILATERAL Bilateral    last done 11-21-14   COLONOSCOPY WITH PROPOFOL N/A 12/25/2014   Procedure: COLONOSCOPY WITH PROPOFOL;  Surgeon: Garlan Fair, MD;  Location: [facility];  Service: Endoscopy;  Laterality: N/A;   FOOT SURGERY Left 2004   Dr Johnnye Sima   JOINT REPLACEMENT     RTKA   ROBOTIC ASSISTED TOTAL HYSTERECTOMY WITH BILATERAL SALPINGO OOPHERECTOMY  01/30/14   with lymph node biopsy   TOTAL HIP ARTHROPLASTY Left 04/17/2022   Procedure: TOTAL HIP ARTHROPLASTY ANTERIOR APPROACH;  Surgeon: Rod Can, MD;  Location: [facility];  Service: Orthopedics;  Laterality: Left;   TOTAL KNEE ARTHROPLASTY     right   TOTAL KNEE REVISION Right 01/04/2019   Procedure: TOTAL KNEE REVISION;  Surgeon: Gaynelle Arabian, MD;  Location: [facility];  Service: Orthopedics;  Laterality: Right;  166mn with block   Patient Active Problem List   Diagnosis Date Noted   Nausea 04/27/2022   Blood loss anemia 04/24/2022   Slow transit constipation  04/24/2022   Subcapital fracture of femur, left, closed, initial encounter ([place] 04/15/2022   Fall at home, initial encounter 04/15/2022   CKD (chronic kidney disease) stage 3, GFR 30-59 ml/min ([facility] 04/15/2022   Hypertensive urgency 04/15/2022   Osteoporosis 04/15/2022   Aortic valve disease 07/10/2021   Dilated cardiomyopathy ([facility] 12/14/2020   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   Orthostatic dizziness 11/14/2020   Educated about COVID-19 virus infection 12/21/2019   Mild cognitive impairment 09/06/2019   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[place] 01/04/2019   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 ([place] 01/04/2019   Preop cardiovascular exam 12/20/2018   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   [place] ([facility] 03/28/2018   Arthralgia of hip or thigh 03/28/2018   Benign essential HTN 03/28/2018   Overweight 03/28/2018   Heart disease 11/09/2014   Leukocytosis 071/24/5809  Diastolic congestive heart failure ([facility] 10/30/2014   Hypothyroidism 10/30/2014   Chest pain at rest 10/30/2014   Chest pain 10/30/2014   Endometrial cancer ([facility] 01/30/2014   Dyspnea 01/11/2014   Edema 01/11/2014   Low back pain with right-sided sciatica 12/13/2013   Degeneration of lumbar or lumbosacral intervertebral disc 12/13/2013   Sciatica 12/13/2013    PCP: CLawerance CruelMD  REFERRING PROVIDER: SNemiah CommanderNP  REFERRING DIAG:  Diagnosis  R26.89 (ICD-10-CM) - Other abnormalities of gait and mobility  Z96.659 (ICD-10-CM) - Presence of unspecified artificial knee joint  Z98.890 (ICD-10-CM) - Other specified postprocedural states    THERAPY DIAG:  Other abnormalities of gait and mobility  Muscle weakness (generalized)  Stiffness of left hip, not elsewhere classified  Rationale for Evaluation and Treatment: Rehabilitation  ONSET DATE: 9/14 Left  hip arthroplasty following fall and fx  SUBJECTIVE:   SUBJECTIVE STATEMENT:  The patient reports the pain in the  front of her hip might be a little better.   Eval: Patient is a 82 year old female status post fall and hip fracture on 04/17/2022.  She had an anterior total hip arthroplasty on 9/14.  She went to a subsequent rehabilitation  followed by home health physical therapy.  She currently feels like she is unsteady on her feet.  She is using a cane for primary mobility outside the home.  She is using a walker inside the home.  She has not had any falls.  Therapy spoke with her daughter who feels like her general mobility has declined since her fall.  PERTINENT HISTORY:  PAIN:  Are you having pain? Yes: NPRS scale: 0/10 No real Pain at this tine.  Pain location: left hip at times  Pain description: aching  Aggravating factors: standing and walking  Relieving factors: rest   PRECAUTIONS: None  WEIGHT BEARING RESTRICTIONS: No  FALLS:  Has patient fallen in last 6 months? Yes. Number of falls 1 resulting in a hip fracture.   LIVING ENVIRONMENT: 4 steps in the front door  OCCUPATION:   Hobbies: Nothing specific   PLOF: Independent  PATIENT GOALS:  Put pants on.   NEXT MD VISIT:   OBJECTIVE:   DIAGNOSTIC FINDINGS:   PATIENT SURVEYS:  FOTO    COGNITION: Overall cognitive status: Within functional limits for tasks assessed     SENSATION: WFL  EDEMA:  Has noticed some swelling following the fall  MUSCLE LENGTH:  POSTURE: Forward head and rounded shoulders  PALPATION:   LOWER EXTREMITY ROM:  Passive ROM Right eval Left eval  Hip flexion    Hip extension    Hip abduction    Hip adduction    Hip internal rotation  15  Hip external rotation  10  Knee flexion    Knee extension    Ankle dorsiflexion    Ankle plantarflexion    Ankle inversion    Ankle eversion     (Blank rows = not tested)  LOWER EXTREMITY MMT:  MMT Right eval Left eval  Hip flexion 19.9 12.2  Hip extension    Hip abduction 16.7 10.8  Hip adduction    Hip internal rotation    Hip external  rotation    Knee flexion    Knee extension 20.7 22.5  Ankle dorsiflexion    Ankle plantarflexion    Ankle inversion    Ankle eversion     (Blank rows = not tested)   FUNCTIONAL TESTS:  Sit to stand  GAIT:  Significant lateral shift to the left in left weight bearing, decreased left hip flexion.   Balance:  Tandem stance: left foot back min to mod a  Right foot back CGA to min a   Narrow base I eyes open  Narrow base CGA eyes closed     TODAY'S TREATMENT:  DATE:  2/8 Manual: ER stretching; manual therapy to the anterior hip; trigger point release to the anterior hip. Roller to anterior hip   LTRx20  Supine march x20  Supine hip abduction red 2x15  Supine bridge 2x10   Step up 2x10 4 inch   Lateral step up 2x10 4 inch   Heel raise x20   2/6 Manual: ER stretching; manual therapy to the anterior hip; trigger point release to the anterior hip. Roller to anterior hip   LTRx20  Supine march x20  Supine hip abduction red 2x15  Supine bridge 2x10   Normal base eyes open and closed 3x20 sec hold   Step up 2x10 4 inch   Heel raises x20   2/1  Manual: ER stretching; manual therapy to the anterior hip; trigger point release to the anterior hip.   LTRx20  Supine march x20  Supine hip abduction red 2x15  Supine bridge 2x10    Standing weight shift x20    Air-ex:  Normal base eyes open and closed 2x20 sec hold   PATIENT EDUCATION:  Education details: HEP, symptom management, safety with ambulation  Person educated: Patient Education method: Explanation, Demonstration, Tactile cues, Verbal cues, and Handouts Education comprehension: verbalized understanding, returned demonstration, verbal cues required, tactile cues required, and needs further education  HOME EXERCISE PROGRAM: Access Code: [email or web address] URL:  [email or web address] Date: 08/12/2022 Prepared by: Carolyne Littles  Exercises - Seated Knee Extension with Resistance  - 1 x daily - 7 x weekly - 3 sets - 10 reps - Seated Hip Abduction with Resistance  - 1 x daily - 7 x weekly - 3 sets - 10 reps - Seated Knee Lifts with Resistance  - 1 x daily - 7 x weekly - 3 sets - 10 reps  ASSESSMENT:  CLINICAL IMPRESSION: The patient continues to have improved tolerance to palpation in the anterior hip. We continued to work on IT trainer today. She had no significant pain just fatigue. Overall she is progressing well. We will work on balance next time.   OBJECTIVE IMPAIRMENTS: Abnormal gait, decreased activity tolerance, decreased endurance, decreased mobility, difficulty walking, decreased ROM, decreased strength, impaired flexibility, and pain.   ACTIVITY LIMITATIONS: standing, stairs, transfers, bed mobility, dressing, and locomotion level  PARTICIPATION LIMITATIONS: meal prep, cleaning, laundry, shopping, community activity, and yard work  PERSONAL FACTORS: Age and 1-2 comorbidities: right knee replacement, scoliosis  are also affecting patient's functional outcome.   REHAB POTENTIAL: Good  CLINICAL DECISION MAKING: Evolving/moderate complexity  EVALUATION COMPLEXITY: Moderate   GOALS: Goals reviewed with patient? Yes  SHORT TERM GOALS: Target date: 09/02/2022   Patient will increase passive hip flexion by 10 degrees Baseline: Goal status: INITIAL  2.  Patient will increase passive hip external rotation by 20 degrees Baseline:  Goal status: INITIAL  3.  Patient will transfer sit to stand without use of hands Baseline:  Goal status: INITIAL  4.  Patient will be independent with basic HEP Baseline:  Goal status: INITIAL   LONG TERM GOALS: Target date: 09/23/2022    Patient will put pants on without difficulty Baseline:  Goal status: INITIAL  2.  Patient will go up and down 4 steps without  difficulty Baseline:  Goal status: INITIAL  3.  Patient will ambulate 2000 feet in the community without self-report of decreased balance or safety Baseline:  Goal status: INITIAL  PLAN:  PT FREQUENCY: 2x/week  PT DURATION: 6 weeks  PLANNED INTERVENTIONS: Therapeutic exercises, Therapeutic activity, Neuromuscular re-education, Balance training,  Gait training, Patient/Family education, Self Care, Joint mobilization, Stair training, DME instructions, Aquatic Therapy, Electrical stimulation, Cryotherapy, Moist heat, Taping, Ultrasound, and Manual therapy  PLAN FOR NEXT SESSION: Consider passive range of motion into flexion and external rotation.  Begin strengthening of hip muscles in different positions.  Review HEP.  Updated HEP.  Consider balance exercises.  Referring diagnosis?  R26.89 (ICD-10-CM) - Other abnormalities of gait and mobility  Z96.659 (ICD-10-CM) - Presence of unspecified artificial knee joint  Z98.890 (ICD-10-CM) - Other specified postprocedural states   Treatment diagnosis? (if different than referring diagnosis)   What was this (referring dx) caused by? '[x]'$  Surgery '[]'$  Fall '[]'$  Ongoing issue '[]'$  Arthritis '[]'$  Other: ____________  Laterality: '[]'$  Rt '[x]'$  Lt '[]'$  Both  Check all possible CPT codes:  *CHOOSE 10 OR LESS*    '[]'$  97110 (Therapeutic Exercise)  '[]'$  92507 (SLP Treatment)  '[]'$  97112 (Neuro Re-ed)   '[]'$  92526 (Swallowing Treatment)   '[]'$  97116 (Gait Training)   '[]'$  37106 (Cognitive Training, 1st 15 minutes) '[]'$  97140 (Manual Therapy)   '[]'$  97130 (Cognitive Training, each add'l 15 minutes)  '[]'$  97164 (Re-evaluation)                              '[]'$  Other, List CPT Code ____________  '[]'$  26948 (Therapeutic Activities)     '[]'$  54627 (Self Care)   '[x]'$  All codes above (97110 - 97535)  '[]'$  97012 (Mechanical Traction)  '[x]'$  97014 (E-stim Unattended)  '[]'$  97032 (E-stim manual)  '[]'$  97033 (Ionto)  '[]'$  97035 (Ultrasound) '[]'$  97750 (Physical Performance Training) '[]'$  [id number]  (Aquatic Therapy) '[]'$  97016 (Vasopneumatic Device) '[]'$  [id number] (Paraffin) '[]'$  97034 (Contrast Bath) '[]'$  97597 (Wound Care 1st 20 sq cm) '[]'$  97598 (Wound Care each add'l 20 sq cm) '[]'$  97760 (Orthotic Fabrication, Fitting, Training Initial) '[]'$  [id number] (Prosthetic Management and Training Initial) '[]'$  [id number] (Orthotic or Prosthetic Training/ Modification Subsequent)   Carney Living, PT 09/03/2022, 10:47 AM

## 2022-09-28 ENCOUNTER — Encounter (HOSPITAL_BASED_OUTPATIENT_CLINIC_OR_DEPARTMENT_OTHER): Payer: Self-pay | Admitting: Physical Therapy

## 2022-09-28 ENCOUNTER — Ambulatory Visit (HOSPITAL_BASED_OUTPATIENT_CLINIC_OR_DEPARTMENT_OTHER): Payer: Medicare PPO | Admitting: Physical Therapy

## 2022-09-28 DIAGNOSIS — R2689 Other abnormalities of gait and mobility: Secondary | ICD-10-CM

## 2022-09-28 DIAGNOSIS — M25652 Stiffness of left hip, not elsewhere classified: Secondary | ICD-10-CM

## 2022-09-28 DIAGNOSIS — M6281 Muscle weakness (generalized): Secondary | ICD-10-CM

## 2022-09-28 DIAGNOSIS — R2681 Unsteadiness on feet: Secondary | ICD-10-CM | POA: Diagnosis not present

## 2022-09-28 DIAGNOSIS — R262 Difficulty in walking, not elsewhere classified: Secondary | ICD-10-CM | POA: Diagnosis not present

## 2022-09-28 NOTE — Therapy (Signed)
OUTPATIENT PHYSICAL THERAPY LOWER EXTREMITY EVALUATION   Patient Name: Terri Ortega MRN: [id number] DOB:1940/11/28, 82 y.o., female Today's Date: 09/29/2022  END OF SESSION:  PT End of Session - 09/28/22 1233     Visit Number 7    Number of Visits 12    Date for PT Re-Evaluation 09/23/22    PT Start Time 1430    PT Stop Time 1512    PT Time Calculation (min) 42 min    Activity Tolerance Patient tolerated treatment well    Behavior During Therapy [facility] for tasks assessed/performed              Past Medical History:  Diagnosis Date   Graves disease    HTN (hypertension)    Low back pain    Dr. Ronnald Ramp   Osteoporosis    Pneumonia 2012   Radiation 03/20/14, 03/27/14, 04/05/14, 04/12/14, 04/17/14   bracytherapy to proximal vagina 30 gray   Scoliosis    Uterine cancer ([place])    surgery and radiation x 5 treatments-last 9'15   Vitamin D deficiency    Past Surgical History:  Procedure Laterality Date   BUNIONECTOMY     left   CATARACT EXTRACTION, BILATERAL Bilateral    last done 11-21-14   COLONOSCOPY WITH PROPOFOL N/A 12/25/2014   Procedure: COLONOSCOPY WITH PROPOFOL;  Surgeon: Garlan Fair, MD;  Location: [facility];  Service: Endoscopy;  Laterality: N/A;   FOOT SURGERY Left 2004   Dr Johnnye Sima   JOINT REPLACEMENT     RTKA   ROBOTIC ASSISTED TOTAL HYSTERECTOMY WITH BILATERAL SALPINGO OOPHERECTOMY  01/30/14   with lymph node biopsy   TOTAL HIP ARTHROPLASTY Left 04/17/2022   Procedure: TOTAL HIP ARTHROPLASTY ANTERIOR APPROACH;  Surgeon: Rod Can, MD;  Location: [place];  Service: Orthopedics;  Laterality: Left;   TOTAL KNEE ARTHROPLASTY     right   TOTAL KNEE REVISION Right 01/04/2019   Procedure: TOTAL KNEE REVISION;  Surgeon: Gaynelle Arabian, MD;  Location: [facility];  Service: Orthopedics;  Laterality: Right;  129mn with block   Patient Active Problem List   Diagnosis Date Noted   Nausea 04/27/2022   Blood loss anemia 04/24/2022   Slow transit constipation  04/24/2022   Subcapital fracture of femur, left, closed, initial encounter ([place] 04/15/2022   Fall at home, initial encounter 04/15/2022   CKD (chronic kidney disease) stage 3, GFR 30-59 ml/min ([place] 04/15/2022   Hypertensive urgency 04/15/2022   Osteoporosis 04/15/2022   Aortic valve disease 07/10/2021   Dilated cardiomyopathy ([place] 12/14/2020   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   Orthostatic dizziness 11/14/2020   Educated about COVID-19 virus infection 12/21/2019   Mild cognitive impairment 09/06/2019   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 ([place] 01/04/2019   Preop cardiovascular exam 12/20/2018   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   [place]terine cancer ([place] 03/28/2018   Arthralgia of hip or thigh 03/28/2018   Benign essential HTN 03/28/2018   Overweight 03/28/2018   Heart disease 11/09/2014   Leukocytosis [id number]  Diastolic congestive heart failure ([place] 10/30/2014   Hypothyroidism 10/30/2014   Chest pain at rest 10/30/2014   Chest pain 10/30/2014   Endometrial cancer ([place] 01/30/2014   Dyspnea 01/11/2014   Edema 01/11/2014   Low back pain with right-sided sciatica 12/13/2013   Degeneration of lumbar or lumbosacral intervertebral disc 12/13/2013   Sciatica 12/13/2013    PCP: CLawerance CruelMD  REFERRING PROVIDER: SNemiah CommanderNP  REFERRING DIAG:  Diagnosis  R26.89 (ICD-10-CM) - Other abnormalities of gait and mobility  Z96.659 (ICD-10-CM) - Presence of unspecified artificial knee joint  Z98.890 (ICD-10-CM) - Other specified postprocedural states    THERAPY DIAG:  Other abnormalities of gait and mobility  Muscle weakness (generalized)  Stiffness of left hip, not elsewhere classified  Rationale for Evaluation and Treatment: Rehabilitation  ONSET DATE: 9/14 Left  hip arthroplasty following fall and fx  SUBJECTIVE:   SUBJECTIVE STATEMENT:  The patient returns for follow up  following a few weeks off 2nd to scheduling issues. She reports her hip is about the same. She has had very little pain. Most of her pain is in her right knee.   Eval: Patient is a 82 year old female status post fall and hip fracture on 04/17/2022.  She had an anterior total hip arthroplasty on 9/14.  She went to a subsequent rehabilitation  followed by home health physical therapy.  She currently feels like she is unsteady on her feet.  She is using a cane for primary mobility outside the home.  She is using a walker inside the home.  She has not had any falls.  Therapy spoke with her daughter who feels like her general mobility has declined since her fall.  PERTINENT HISTORY:  PAIN:  Are you having pain? Yes: NPRS scale: 0/10 No real Pain at this tine.  Pain location: left hip at times  Pain description: aching  Aggravating factors: standing and walking  Relieving factors: rest   PRECAUTIONS: None  WEIGHT BEARING RESTRICTIONS: No  FALLS:  Has patient fallen in last 6 months? Yes. Number of falls 1 resulting in a hip fracture.   LIVING ENVIRONMENT: 4 steps in the front door  OCCUPATION:   Hobbies: Nothing specific   PLOF: Independent  PATIENT GOALS:  Put pants on.   NEXT MD VISIT:   OBJECTIVE:   DIAGNOSTIC FINDINGS:   PATIENT SURVEYS:  FOTO    COGNITION: Overall cognitive status: Within functional limits for tasks assessed     SENSATION: WFL  EDEMA:  Has noticed some swelling following the fall  MUSCLE LENGTH:  POSTURE: Forward head and rounded shoulders  PALPATION:   LOWER EXTREMITY ROM:  Passive ROM Right eval Left eval Left   Hip flexion   107  Hip extension     Hip abduction     Hip adduction     Hip internal rotation  15 15  Hip external rotation  10 58  Knee flexion     Knee extension     Ankle dorsiflexion     Ankle plantarflexion     Ankle inversion     Ankle eversion      (Blank rows = not tested)  LOWER EXTREMITY MMT:  MMT  Right eval Left eval Right  2/26 Left  2/26  Hip flexion 19.9 12.2 16.7 17.9  Hip extension      Hip abduction 16.7 10.8 20.1 27.0  Hip adduction      Hip internal rotation      Hip external rotation      Knee flexion      Knee extension 20.7 22.5  26.1  Ankle dorsiflexion      Ankle plantarflexion      Ankle inversion      Ankle eversion       (Blank rows = not tested)   FUNCTIONAL TESTS:  Sit to stand minimal use of hands  GAIT:  Significant lateral shift to the left  in left weight bearing, decreased left hip flexion.   Balance:  Tandem stance: left foot back min to mod a  Right foot back CGA to min a   Narrow base I eyes open  Narrow base CGA eyes closed     TODAY'S TREATMENT:                                                                                                                              DATE:  2/26 Manual: ER stretching; manual therapy to the anterior hip; trigger point release to the anterior hip. Roller to anterior hip    Supine march x20  Supine hip abduction red 2x15  Supine bridge 2x10   Standing heel raise x20  Standing low march 2x15  Sit to stand 3x  Performed re-assessment on the patient. Reviewed test and measures and goals including strength testing, FOTO, and ROM   2/8 Manual: ER stretching; manual therapy to the anterior hip; trigger point release to the anterior hip. Roller to anterior hip   LTRx20  Supine march x20  Supine hip abduction red 2x15  Supine bridge 2x10   Step up 2x10 4 inch   Lateral step up 2x10 4 inch   Heel raise x20   2/6 Manual: ER stretching; manual therapy to the anterior hip; trigger point release to the anterior hip. Roller to anterior hip   LTRx20  Supine march x20  Supine hip abduction red 2x15  Supine bridge 2x10   Normal base eyes open and closed 3x20 sec hold   Step up 2x10 4 inch   Heel raises x20   2/1  Manual: ER stretching; manual therapy to the anterior hip; trigger point  release to the anterior hip.   LTRx20  Supine march x20  Supine hip abduction red 2x15  Supine bridge 2x10    Standing weight shift x20    Air-ex:  Normal base eyes open and closed 2x20 sec hold   PATIENT EDUCATION:  Education details: HEP, symptom management, safety with ambulation  Person educated: Patient Education method: Explanation, Demonstration, Tactile cues, Verbal cues, and Handouts Education comprehension: verbalized understanding, returned demonstration, verbal cues required, tactile cues required, and needs further education  HOME EXERCISE PROGRAM: Access Code: [email or web address] URL: [email or web address] Date: 08/12/2022 Prepared by: Carolyne Littles  Exercises - Seated Knee Extension with Resistance  - 1 x daily - 7 x weekly - 3 sets - 10 reps - Seated Hip Abduction with Resistance  - 1 x daily - 7 x weekly - 3 sets - 10 reps - Seated Knee Lifts with Resistance  - 1 x daily - 7 x weekly - 3 sets - 10 reps  ASSESSMENT:  CLINICAL IMPRESSION: Therapy performed a re-assessment on the patient today. She is doing very well. Her strength and ROM have improved significantly. She continues ot have an area of tenderness in her anterior hip. It has improved despite reports of minimal work on it in the  past few weeks. Despite layoff, her hip is doing well. Her ER has improved significantly. Her C/O at this time is her right knee. We had been calling her for appointment openings but we had the wrong number on file. We will extend her plan for 6 more weeks, but we will likely review her HEP next visit in preporation for D/C. She was advised to have her knee assessed if it continues to bother her on the right side.    OBJECTIVE IMPAIRMENTS: Abnormal gait, decreased activity tolerance, decreased endurance, decreased mobility, difficulty walking, decreased ROM, decreased strength, impaired flexibility, and pain.   ACTIVITY LIMITATIONS: standing, stairs, transfers, bed mobility,  dressing, and locomotion level  PARTICIPATION LIMITATIONS: meal prep, cleaning, laundry, shopping, community activity, and yard work  PERSONAL FACTORS: Age and 1-2 comorbidities: right knee replacement, scoliosis  are also affecting patient's functional outcome.   REHAB POTENTIAL: Good  CLINICAL DECISION MAKING: Evolving/moderate complexity  EVALUATION COMPLEXITY: Moderate   GOALS: Goals reviewed with patient? Yes  SHORT TERM GOALS: Target date: 09/02/2022   Patient will increase passive hip flexion by 10 degrees Baseline: Goal status: INITIAL  2.  Patient will increase passive hip external rotation by 20 degrees Baseline:  Goal status: INITIAL  3.  Patient will transfer sit to stand without use of hands Baseline:  Goal status: INITIAL  4.  Patient will be independent with basic HEP Baseline:  Goal status: INITIAL   LONG TERM GOALS: Target date: 09/23/2022    Patient will put pants on without difficulty Baseline:  Goal status: INITIAL  2.  Patient will go up and down 4 steps without difficulty Baseline:  Goal status: INITIAL  3.  Patient will ambulate 2000 feet in the community without self-report of decreased balance or safety Baseline:  Goal status: INITIAL  PLAN:  PT FREQUENCY: 2x/week  PT DURATION: 6 weeks  PLANNED INTERVENTIONS: Therapeutic exercises, Therapeutic activity, Neuromuscular re-education, Balance training, Gait training, Patient/Family education, Self Care, Joint mobilization, Stair training, DME instructions, Aquatic Therapy, Electrical stimulation, Cryotherapy, Moist heat, Taping, Ultrasound, and Manual therapy  PLAN FOR NEXT SESSION: review final HEP, likely D/C unless she feels like she needs more visits.   Referring diagnosis?  R26.89 (ICD-10-CM) - Other abnormalities of gait and mobility  Z96.659 (ICD-10-CM) - Presence of unspecified artificial knee joint  Z98.890 (ICD-10-CM) - Other specified postprocedural states   Treatment  diagnosis? (if different than referring diagnosis)   What was this (referring dx) caused by? '[x]'$  Surgery '[]'$  Fall '[]'$  Ongoing issue '[]'$  Arthritis '[]'$  Other: ____________  Laterality: '[]'$  Rt '[x]'$  Lt '[]'$  Both  Check all possible CPT codes:  *CHOOSE 10 OR LESS*    '[]'$  97110 (Therapeutic Exercise)  '[]'$  92507 (SLP Treatment)  '[]'$  97112 (Neuro Re-ed)   '[]'$  92526 (Swallowing Treatment)   '[]'$  97116 (Gait Training)   '[]'$  [id number] (Cognitive Training, 1st 15 minutes) '[]'$  97140 (Manual Therapy)   '[]'$  97130 (Cognitive Training, each add'l 15 minutes)  '[]'$  97164 (Re-evaluation)                              '[]'$  Other, List CPT Code ____________  '[]'$  [id number] (Therapeutic Activities)     '[]'$  [id number] (Self Care)   '[x]'$  All codes above (97110 - 97535)  '[]'$  97012 (Mechanical Traction)  '[x]'$  97014 (E-stim Unattended)  '[]'$  97032 (E-stim manual)  '[]'$  97033 (Ionto)  '[]'$  97035 (Ultrasound) '[]'$  97750 (Physical Performance Training) '[]'$  [id number] (Aquatic Therapy) '[]'$   [id number] (Vasopneumatic Device) '[]'$  [id number] (Paraffin) '[]'$  97034 (Contrast Bath) '[]'$  97597 (Wound Care 1st 20 sq cm) '[]'$  97598 (Wound Care each add'l 20 sq cm) '[]'$  97760 (Orthotic Fabrication, Fitting, Training Initial) '[]'$  [id number] (Prosthetic Management and Training Initial) '[]'$  [id number] (Orthotic or Prosthetic Training/ Modification Subsequent)   Carney Living, PT 09/29/2022, 8:56 AM

## 2022-09-29 ENCOUNTER — Encounter (HOSPITAL_BASED_OUTPATIENT_CLINIC_OR_DEPARTMENT_OTHER): Payer: Self-pay | Admitting: Physical Therapy

## 2022-09-30 ENCOUNTER — Ambulatory Visit (HOSPITAL_BASED_OUTPATIENT_CLINIC_OR_DEPARTMENT_OTHER): Payer: Medicare PPO | Admitting: Physical Therapy

## 2022-09-30 DIAGNOSIS — R2681 Unsteadiness on feet: Secondary | ICD-10-CM

## 2022-09-30 DIAGNOSIS — M6281 Muscle weakness (generalized): Secondary | ICD-10-CM

## 2022-09-30 DIAGNOSIS — R262 Difficulty in walking, not elsewhere classified: Secondary | ICD-10-CM | POA: Diagnosis not present

## 2022-09-30 DIAGNOSIS — R2689 Other abnormalities of gait and mobility: Secondary | ICD-10-CM

## 2022-09-30 DIAGNOSIS — M25652 Stiffness of left hip, not elsewhere classified: Secondary | ICD-10-CM

## 2022-09-30 NOTE — Therapy (Signed)
OUTPATIENT PHYSICAL THERAPY LOWER EXTREMITY/Discharge    Patient Name: Terri Ortega MRN: [id number] DOB:10-16-1940, 82 y.o., female Today's Date: 09/29/2022  END OF SESSION:  PT End of Session - 09/28/22 1233     Visit Number 7    Number of Visits 12    Date for PT Re-Evaluation 09/23/22    PT Start Time 1430    PT Stop Time 1512    PT Time Calculation (min) 42 min    Activity Tolerance Patient tolerated treatment well    Behavior During Therapy [facility] for tasks assessed/performed              Past Medical History:  Diagnosis Date   Graves disease    HTN (hypertension)    Low back pain    Dr. Ronnald Ramp   Osteoporosis    Pneumonia 2012   Radiation 03/20/14, 03/27/14, 04/05/14, 04/12/14, 04/17/14   bracytherapy to proximal vagina 30 gray   Scoliosis    Uterine cancer ([place])    surgery and radiation x 5 treatments-last 9'15   Vitamin D deficiency    Past Surgical History:  Procedure Laterality Date   BUNIONECTOMY     left   CATARACT EXTRACTION, BILATERAL Bilateral    last done 11-21-14   COLONOSCOPY WITH PROPOFOL N/A 12/25/2014   Procedure: COLONOSCOPY WITH PROPOFOL;  Surgeon: Garlan Fair, MD;  Location: [facility];  Service: Endoscopy;  Laterality: N/A;   FOOT SURGERY Left 2004   Dr Johnnye Sima   JOINT REPLACEMENT     RTKA   ROBOTIC ASSISTED TOTAL HYSTERECTOMY WITH BILATERAL SALPINGO OOPHERECTOMY  01/30/14   with lymph node biopsy   TOTAL HIP ARTHROPLASTY Left 04/17/2022   Procedure: TOTAL HIP ARTHROPLASTY ANTERIOR APPROACH;  Surgeon: Rod Can, MD;  Location: [place];  Service: Orthopedics;  Laterality: Left;   TOTAL KNEE ARTHROPLASTY     right   TOTAL KNEE REVISION Right 01/04/2019   Procedure: TOTAL KNEE REVISION;  Surgeon: Gaynelle Arabian, MD;  Location: [facility];  Service: Orthopedics;  Laterality: Right;  131mn with block   Patient Active Problem List   Diagnosis Date Noted   Nausea 04/27/2022   Blood loss anemia 04/24/2022   Slow transit constipation  04/24/2022   Subcapital fracture of femur, left, closed, initial encounter ([place] 04/15/2022   Fall at home, initial encounter 04/15/2022   CKD (chronic kidney disease) stage 3, GFR 30-59 ml/min ([place] 04/15/2022   Hypertensive urgency 04/15/2022   Osteoporosis 04/15/2022   Aortic valve disease 07/10/2021   Dilated cardiomyopathy (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[place] 01/04/2019   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 ([place] 01/04/2019   Preop cardiovascular exam 12/20/2018   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   [place]terine cancer ([place] 03/28/2018   Arthralgia of hip or thigh 03/28/2018   Benign essential HTN 03/28/2018   Overweight 03/28/2018   Heart disease 11/09/2014   Leukocytosis [id number]  Diastolic congestive heart failure ([place] 10/30/2014   Hypothyroidism 10/30/2014   Chest pain at rest 10/30/2014   Chest pain 10/30/2014   Endometrial cancer ([place] 01/30/2014   Dyspnea 01/11/2014   Edema 01/11/2014   Low back pain with right-sided sciatica 12/13/2013   Degeneration of lumbar or lumbosacral intervertebral disc 12/13/2013   Sciatica 12/13/2013    PCP: CLawerance CruelMD  REFERRING PROVIDER: SNemiah CommanderNP  REFERRING DIAG:  Diagnosis  R26.89 (ICD-10-CM) - Other abnormalities of gait and mobility  Z96.659 (ICD-10-CM) - Presence of unspecified artificial knee joint  Z98.890 (ICD-10-CM) - Other specified postprocedural states    THERAPY DIAG:  Other abnormalities of gait and mobility  Muscle weakness (generalized)  Stiffness of left hip, not elsewhere classified  Rationale for Evaluation and Treatment: Rehabilitation  ONSET DATE: 9/14 Left  hip arthroplasty following fall and fx  SUBJECTIVE:   SUBJECTIVE STATEMENT:  The patient has no complaints today.  Her knee is hurting her less.   Eval: Patient is a 82 year old female status post fall and hip fracture on 04/17/2022.  She had an anterior total hip arthroplasty on 9/14.  She went to a subsequent rehabilitation  followed by home health physical therapy.  She currently feels like she is unsteady on her feet.  She is using a cane for primary mobility outside the home.  She is using a walker inside the home.  She has not had any falls.  Therapy spoke with her daughter who feels like her general mobility has declined since her fall.  PERTINENT HISTORY:  PAIN:  Are you having pain? Yes: NPRS scale: 0/10 No real Pain at this tine.  Pain location: left hip at times  Pain description: aching  Aggravating factors: standing and walking  Relieving factors: rest   PRECAUTIONS: None  WEIGHT BEARING RESTRICTIONS: No  FALLS:  Has patient fallen in last 6 months? Yes. Number of falls 1 resulting in a hip fracture.   LIVING ENVIRONMENT: 4 steps in the front door  OCCUPATION:   Hobbies: Nothing specific   PLOF: Independent  PATIENT GOALS:  Put pants on.   NEXT MD VISIT:   OBJECTIVE:   DIAGNOSTIC FINDINGS:   PATIENT SURVEYS:  FOTO    COGNITION: Overall cognitive status: Within functional limits for tasks assessed     SENSATION: WFL  EDEMA:  Has noticed some swelling following the fall  MUSCLE LENGTH:  POSTURE: Forward head and rounded shoulders  PALPATION:   LOWER EXTREMITY ROM:  Passive ROM Right eval Left eval Left   Hip flexion   107  Hip extension     Hip abduction     Hip adduction     Hip internal rotation  15 15  Hip external rotation  10 58  Knee flexion     Knee extension     Ankle dorsiflexion     Ankle plantarflexion     Ankle inversion     Ankle eversion      (Blank rows = not tested)  LOWER EXTREMITY MMT:  MMT Right eval Left eval Right  2/26 Left  2/26  Hip flexion 19.9 12.2 16.7 17.9  Hip extension      Hip abduction 16.7 10.8 20.1  27.0  Hip adduction      Hip internal rotation      Hip external rotation      Knee flexion      Knee extension 20.7 22.5  26.1  Ankle dorsiflexion      Ankle plantarflexion      Ankle inversion      Ankle eversion       (Blank rows = not tested)   FUNCTIONAL TESTS:  Sit to stand minimal use of hands  GAIT:  Significant lateral shift to the left in left weight bearing, decreased left hip flexion.   Balance:  Tandem stance: left foot back min to mod a  Right foot back CGA  to min a   Narrow base I eyes open  Narrow base CGA eyes closed     TODAY'S TREATMENT:                                                                                                                              DATE:  Manual: ER stretching; manual therapy to the anterior hip; trigger point release to the anterior hip. Roller to anterior hip    Supine march x20  Supine hip abduction red 2x15  Supine bridge 2x10   Standing heel raise x20  Standing low march 2x15 Standing hip abduction 2x10  Standing hip extension 2x10     2/26 Manual: ER stretching; manual therapy to the anterior hip; trigger point release to the anterior hip. Roller to anterior hip    Supine march x20  Supine hip abduction red 2x15  Supine bridge 2x10   Standing heel raise x20  Standing low march 2x15  Sit to stand 3x  Performed re-assessment on the patient. Reviewed test and measures and goals including strength testing, FOTO, and ROM   2/8 Manual: ER stretching; manual therapy to the anterior hip; trigger point release to the anterior hip. Roller to anterior hip   LTRx20  Supine march x20  Supine hip abduction red 2x15  Supine bridge 2x10   Step up 2x10 4 inch   Lateral step up 2x10 4 inch   Heel raise x20   2/6 Manual: ER stretching; manual therapy to the anterior hip; trigger point release to the anterior hip. Roller to anterior hip   LTRx20  Supine march x20  Supine hip abduction red 2x15  Supine  bridge 2x10   Normal base eyes open and closed 3x20 sec hold   Step up 2x10 4 inch   Heel raises x20   2/1  Manual: ER stretching; manual therapy to the anterior hip; trigger point release to the anterior hip.   LTRx20  Supine march x20  Supine hip abduction red 2x15  Supine bridge 2x10    Standing weight shift x20    Air-ex:  Normal base eyes open and closed 2x20 sec hold   PATIENT EDUCATION:  Education details: HEP, symptom management, safety with ambulation  Person educated: Patient Education method: Explanation, Demonstration, Tactile cues, Verbal cues, and Handouts Education comprehension: verbalized understanding, returned demonstration, verbal cues required, tactile cues required, and needs further education  HOME EXERCISE PROGRAM: Access Code: [email or web address] URL: [email or web address] Date: 08/12/2022 Prepared by: Carolyne Littles  Exercises - Seated Knee Extension with Resistance  - 1 x daily - 7 x weekly - 3 sets - 10 reps - Seated Hip Abduction with Resistance  - 1 x daily - 7 x weekly - 3 sets - 10 reps - Seated Knee Lifts with Resistance  - 1 x daily - 7 x weekly - 3 sets - 10 reps  ASSESSMENT:  CLINICAL IMPRESSION: The patient has progressed well. She has a full exercise  program at this time. She feels comfortable with her home program. We expanded her standing series today. Overall she has made great progress. Her strength has improved significantly. Goals were assessed last visit    OBJECTIVE IMPAIRMENTS: Abnormal gait, decreased activity tolerance, decreased endurance, decreased mobility, difficulty walking, decreased ROM, decreased strength, impaired flexibility, and pain.   ACTIVITY LIMITATIONS: standing, stairs, transfers, bed mobility, dressing, and locomotion level  PARTICIPATION LIMITATIONS: meal prep, cleaning, laundry, shopping, community activity, and yard work  PERSONAL FACTORS: Age and 1-2 comorbidities: right knee replacement,  scoliosis  are also affecting patient's functional outcome.   REHAB POTENTIAL: Good  CLINICAL DECISION MAKING: Evolving/moderate complexity  EVALUATION COMPLEXITY: Moderate   GOALS: Goals reviewed with patient? Yes  SHORT TERM GOALS: Target date: 09/02/2022   Patient will increase passive hip flexion by 10 degrees Baseline: Goal status: INITIAL  2.  Patient will increase passive hip external rotation by 20 degrees Baseline:  Goal status: INITIAL  3.  Patient will transfer sit to stand without use of hands Baseline:  Goal status: INITIAL  4.  Patient will be independent with basic HEP Baseline:  Goal status: INITIAL   LONG TERM GOALS: Target date: 09/23/2022    Patient will put pants on without difficulty Baseline:  Goal status: INITIAL  2.  Patient will go up and down 4 steps without difficulty Baseline:  Goal status: INITIAL  3.  Patient will ambulate 2000 feet in the community without self-report of decreased balance or safety Baseline:  Goal status: INITIAL  PLAN:  PT FREQUENCY: 2x/week  PT DURATION: 6 weeks  PLANNED INTERVENTIONS: Therapeutic exercises, Therapeutic activity, Neuromuscular re-education, Balance training, Gait training, Patient/Family education, Self Care, Joint mobilization, Stair training, DME instructions, Aquatic Therapy, Electrical stimulation, Cryotherapy, Moist heat, Taping, Ultrasound, and Manual therapy  PLAN FOR NEXT SESSION: review final HEP, likely D/C unless she feels like she needs more visits.   Referring diagnosis?  R26.89 (ICD-10-CM) - Other abnormalities of gait and mobility  Z96.659 (ICD-10-CM) - Presence of unspecified artificial knee joint  Z98.890 (ICD-10-CM) - Other specified postprocedural states   Treatment diagnosis? (if different than referring diagnosis)   What was this (referring dx) caused by? '[x]'$  Surgery '[]'$  Fall '[]'$  Ongoing issue '[]'$  Arthritis '[]'$  Other: ____________  Laterality: '[]'$  Rt '[x]'$  Lt '[]'$   Both  Check all possible CPT codes:  *CHOOSE 10 OR LESS*    '[]'$  97110 (Therapeutic Exercise)  '[]'$  92507 (SLP Treatment)  '[]'$  97112 (Neuro Re-ed)   '[]'$  92526 (Swallowing Treatment)   '[]'$  97116 (Gait Training)   '[]'$  [id number] (Cognitive Training, 1st 15 minutes) '[]'$  16109 (Manual Therapy)   '[]'$  97130 (Cognitive Training, each add'l 15 minutes)  '[]'$  97164 (Re-evaluation)                              '[]'$  Other, List CPT Code ____________  '[]'$  [id number] (Therapeutic Activities)     '[]'$  97535 (Self Care)   '[x]'$  All codes above (97110 - 97535)  '[]'$  97012 (Mechanical Traction)  '[x]'$  97014 (E-stim Unattended)  '[]'$  97032 (E-stim manual)  '[]'$  97033 (Ionto)  '[]'$  97035 (Ultrasound) '[]'$  97750 (Physical Performance Training) '[]'$  [id number] (Aquatic Therapy) '[]'$  97016 (Vasopneumatic Device) '[]'$  [id number] (Paraffin) '[]'$  97034 (Contrast Bath) '[]'$  97597 (Wound Care 1st 20 sq cm) '[]'$  97598 (Wound Care each add'l 20 sq cm) '[]'$  97760 (Orthotic Fabrication, Fitting, Training Initial) '[]'$  [id number] (Prosthetic Management and Training Initial) '[]'$  [id number] (Orthotic or Prosthetic  Training/ Modification Subsequent)   Carney Living, PT 09/29/2022, 8:56 AM

## 2022-10-05 DIAGNOSIS — M6281 Muscle weakness (generalized): Secondary | ICD-10-CM | POA: Diagnosis not present

## 2022-10-05 DIAGNOSIS — S72032D Displaced midcervical fracture of left femur, subsequent encounter for closed fracture with routine healing: Secondary | ICD-10-CM | POA: Diagnosis not present

## 2022-10-07 ENCOUNTER — Ambulatory Visit: Payer: Medicare PPO | Admitting: Neurology

## 2022-10-07 ENCOUNTER — Encounter: Payer: Self-pay | Admitting: Neurology

## 2022-10-07 VITALS — BP 142/64 | HR 60 | Ht 64.0 in | Wt 173.4 lb

## 2022-10-07 DIAGNOSIS — G3184 Mild cognitive impairment, so stated: Secondary | ICD-10-CM

## 2022-10-07 MED ORDER — DONEPEZIL HCL 10 MG PO TABS: 10.0000 mg | ORAL_TABLET | Freq: Every day | ORAL | 4 refills | Status: DC

## 2022-10-07 NOTE — Patient Instructions (Addendum)
Follow up as needed Refilled aricept

## 2022-10-07 NOTE — Progress Notes (Signed)
[id number]    Provider:  Dr Jaynee Eagles Requesting Provider: Lawerance Cruel, MD Primary Care Provider:  Lawerance Cruel, MD  CC:  [facility]  10/07/2022: She had hip surgery and feeling a coginitely fuzzy since then. On donepezil. MMSE was actually better today than last year 26/30. Both feel like her memory is stable.  Discussed repeating formal memory testing from 2022 "We reviewed the results of the recent neuropsychological evaluation which were quite encouraging and showed no indication of any progressive type of dementia process and is clearly not consistent with a progressive dementia such as Alzheimer's or Lewy body type of condition." Dr Sima Matas and she declines at this time. Discussed Namenda which is another medication but if stable I wouldn;t start that/ Also discussed lecanemab if needed in the future at this time I wouldn;t recommend anything new and she declines anything new or mor etestinv, just clinically monitoring.   Patient complains of symptoms per HPI as well as the following symptoms: none . Pertinent negatives and positives per HPI. All others negative   10/06/2021: Patient here with husband and grandson today for follow up requested for memory loss. Daughter is on the phone. She is on Aricept. There is depression. She is snippy. She is blue. Everything is been about the same. MMSE has remained stable over years. Driving is fine, no accidents. Family here thinks memory is fine. She goes to bed about 10 and wakes up at 7am. She feels refreshed when she gets up. She naps during the day. She is getting out, she doesn't go shopping, she goes out to eat with her husband, she would like to spend more time with the great grandchildren. She does not feel she is sad most days. She declines bing irritable. Husband says "she is not so bad". They have to talk louder because of hearing. No falls. No swallowing problems. Appetite is good. In general life is about the same. She  declines anymore memory testing. Family does not report anything else. Her daughter is concerned about irritability and starting Lexapro but patient declines.   Patient complains of symptoms per HPI as well as the following symptoms: none . Pertinent negatives and positives per HPI. All others negative   Per Dr. Sima Matas: 11/04/2020 2 PM-3 PM: Today's visit was an in person visit that was conducted in my outpatient clinic office with the patient and her husband present.  We reviewed the results of the recent neuropsychological evaluation which were quite encouraging and showed no indication of any progressive type of dementia process and is clearly not consistent with a progressive dementia such as Alzheimer's or Lewy body type of condition  05/23/2021: Her dizziness is better. She is unsure of her stepping. She is still driving. She drove to [place]. She declines driving test. She feels she is imbalanced. It is aggravating, daughter feels she did not follow through with PT and feels the imbalance is disuse. Discussed [facility], will refer to [facility] for [facility]rapy.   HPI 11/12/2020:  Terri Ortega is a 82 y.o. female here as requested by [name], MD for dizziness. PMHx hypothyroidism, hypertension, obstructive sleep apnea (borderline not needing CPAP), diastolic congestive heart failure, osteoporosis, remote uterine cancer s/p hysterectomy 2015, memory difficulties and had a thorough evaluation including formal memory testing which was not consistent with a neurodegenerative disorder at this time.  She feels dizzy, here with her daughter, less than 6 months ago, she got out of bed ane morning and she fell, she  didn't bump her head, she did not pass out, she felt dizzy. In the last 2 weeks she is comfortable because she is lightheaded, dizzy, the room may spin or she feels like there is movement when there is not, always when standing or walking, worse when she first gets up in the  morning, she is not following instructions or wearing her compression stockings. She doesn't drink a lot of fluids, she had a sleep evaluation. No neck pain. Daughter thinks she may have some depression, we discussed that today, They don;t want to [place], we can monitor. No other focal neurologic deficits, associated symptoms, inciting events or modifiable factors.  I reviewed Dr. Lina Sar new notes, patient transiently with change of position dizziness, orthostatics negative, they stopped her diuretics, she was encouraged to wear support stockings, push fluids, CT of the head was negative for any acute pathology, referred back to neurology for further evaluation of dizziness.  At last appointment in September 09, 2020 she still complained of some dizziness with change of position, she did sustain a fall because of dizziness, again CT of her head was negative for any acute pathology, she did voice concern for not eating as much and not drinking as much fluids therefore they stopped her diuretics, she was complaining of feeling dizzy when going from sitting to lying, lying to sitting, otherwise neuro exam was nonfocal.  Patient complains of symptoms per HPI as well as the following symptoms: dizziness, memory difficulty . Pertinent negatives and positives per HPI. All others negative  Interval history Dec 25, 2019: Here with husband and daughter to review work-up.  Reviewed MRI of the brain images as below, also reviewed findings of formal memory testing which were not consistent with degenerative dementia such as Alzheimer's, Lewy body or other cortical dementia, there was some pattern of lateralization to performance with almost all her deficits in the areas of visual processing and visual cognitive functioning, it could be very early in picking up some cognitive changes they will retest her in 9 months.  We offered her a sleep test due to her snoring and difficulty with achieving good restful sleep but  she declined.  No significant depression or anxiety.  Answered all questions.  1.   Mild to moderate generalized cortical atrophy. 2.   T2/FLAIR hyperintense foci in the hemispheres consistent with moderate chronic microvascular ischemic change. 3.   Mild right chronic maxillary sinusitis. 4.   There were no acute findings and there is a normal enhancement pattern.  HPI:  Terri Ortega is a 82 y.o. female here as requested by Lawerance Cruel, MD for memory loss. PMHx high blood pressure, uterine cancer, osteoporosis, hypothyroidism, memory difficulties, diastolic congestive heart failure, overweight.  I reviewed notes from Dr. Delfina Redwood, B12 458, TSH 4.27.  Patient recently presented for memory evaluation, patient considering better memory, she scored 26 out of 30 on the Mini-Mental status exam, I am not sure but the note states possibly she has been part of a financial scheme or the victim I am not sure, she could not recall any other specific issues with her memory, she did report being under a great deal expressed about the election in the pandemic.  I reviewed the report of a head CT from 2004, patient had a fall, head CT without contrast, no evidence of fracture or intracranial hemorrhage, mild atrophy, overall unremarkable per report (too old to be able to access images). She is here with her daughter who also  provides much information. She had knee surgery in 01/2019 and she has improved but it is still hard for her, her knee hurts when she walks. She is very independent, she drives to the grocery store, still driving well, no falls, she does not think her memory has been a problem even since getting anesthesia, she feels her husband's memory problems are so obvious and he knows it, it is frustrating for her husband and she notices it, moreso names of people and she notices it, she hekps him keep appointments. They visit with family and grandchildren, she still enjoys socializing, seeing family,  its hard with Covid and isolation and they can't see relatives such as her sister in law who is close by but can't spend time together. They grow collards in their garden. She does notice some memory changes in herself. Daughter says they feel patient's logic is impaired and some memory, the logic is impaired, she still texts but she cn get mixed up sometime, she had a scammer call her on the phone and a few years ago she gave $300 to someone she had no idea, to pay someone to keep her computer updated, he called again and fortunately she did not lose anything again and she gave up talking to him and went to her bank and told them what had happened and they pulled up her account. The week before last she started to pull up the [employer] site for her husband she pays the bills and suddenly on her monitor she saw menus she called the number and he wanted $200 and she declined. Her mother died young, she was a foster child, she doesn't know her father. She likes to take a nap in  The afternoon. Discussed POA and HCPOA. She snores heavily, she drools on her pillow, she denies vivid dreams, no shaking, no shuffling, no depression or anxiety.   Reviewed notes, labs and imaging from outside physicians, which showed:   See above  Review of Systems: Patient complains of symptoms per HPI as well as the following symptoms: imbalance, disuse . Pertinent negatives and positives per HPI. All others negative     Social History   Socioeconomic History   Marital status: Married    Spouse name: Not on file   Number of children: 3   Years of education: Not on file   Highest education level: High school graduate  Occupational History   Occupation: Chartered certified accountant  Tobacco Use   Smoking status: Never   Smokeless tobacco: Never  Vaping Use   Vaping Use: Never used  Substance and Sexual Activity   Alcohol use: Never   Drug use: Never   Sexual activity: Not Currently  Other Topics Concern   Not on  file  Social History Narrative   Lives at home with husband, [place].  Four grand and five great grands.     Caffeine: diet coke about 1-2 per day    Right handed   Social Determinants of Health   Financial Resource Strain: Not on file  Food Insecurity: No Food Insecurity (04/22/2022)   Hunger Vital Sign    Worried About Running Out of Food in the Last Year: Never true    Ran Out of Food in the Last Year: Never true  Transportation Needs: No Transportation Needs (04/22/2022)   PRAPARE - Hydrologist (Medical): No    Lack of Transportation (Non-Medical): No  Physical Activity: Not on file  Stress: Not on file  Social Connections: Not on file  Intimate Partner Violence: Not At Risk (04/22/2022)   Humiliation, Afraid, Rape, and Kick questionnaire    Fear of Current or Ex-Partner: No    Emotionally Abused: No    Physically Abused: No    Sexually Abused: No    Family History  Adopted: Yes  Problem Relation Age of Onset   Heart disease Mother 68       No details   Memory loss Neg Hx    Dementia Neg Hx     Past Medical History:  Diagnosis Date   Graves disease    HTN (hypertension)    Low back pain    Dr. Ronnald Ramp   Osteoporosis    Pneumonia 2012   Radiation 03/20/14, 03/27/14, 04/05/14, 04/12/14, 04/17/14   bracytherapy to proximal vagina 30 gray   Scoliosis    Uterine cancer ([place])    surgery and radiation x 5 treatments-last 9'15   Vitamin D deficiency     Patient Active Problem List   Diagnosis Date Noted   Nausea 04/27/2022   Blood loss anemia 04/24/2022   Slow transit constipation 04/24/2022   Subcapital fracture of femur, left, closed, initial encounter ([place]) 04/15/2022   Fall at home, initial encounter 04/15/2022   CKD (chronic kidney disease) stage 3, GFR 30-59 ml/min ([place]) 04/15/2022   Hypertensive urgency 04/15/2022   Osteoporosis 04/15/2022   Aortic valve disease 07/10/2021   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   Nonrheumatic  aortic valve insufficiency 12/14/2020   Orthostatic dizziness 11/14/2020   Educated about COVID-19 virus infection 12/21/2019   Mild cognitive impairment 09/06/2019   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   Preop cardiovascular exam 12/20/2018   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   Uterine cancer ([place]) 03/28/2018   Arthralgia of hip or thigh 03/28/2018   Benign essential HTN 03/28/2018   Overweight 03/28/2018   Heart disease 11/09/2014   Leukocytosis [id number]   Diastolic congestive heart failure ([place]) 10/30/2014   Hypothyroidism 10/30/2014   Chest pain at rest 10/30/2014   Chest pain 10/30/2014   Endometrial cancer ([place]) 01/30/2014   Dyspnea 01/11/2014   Edema 01/11/2014   Low back pain with right-sided sciatica 12/13/2013   Degeneration of lumbar or lumbosacral intervertebral disc 12/13/2013   Sciatica 12/13/2013    Past Surgical History:  Procedure Laterality Date   BUNIONECTOMY     left   CATARACT EXTRACTION, BILATERAL Bilateral    last done 11-21-14   COLONOSCOPY WITH PROPOFOL N/A 12/25/2014   Procedure: COLONOSCOPY WITH PROPOFOL;  Surgeon: Garlan Fair, MD;  Location: [facility];  Service: Endoscopy;  Laterality: N/A;   FOOT SURGERY Left 2004   Dr Johnnye Sima   JOINT REPLACEMENT     RTKA   ROBOTIC ASSISTED TOTAL HYSTERECTOMY WITH BILATERAL SALPINGO OOPHERECTOMY  01/30/14   with lymph node biopsy   TOTAL HIP ARTHROPLASTY Left 04/17/2022   Procedure: TOTAL HIP ARTHROPLASTY ANTERIOR APPROACH;  Surgeon: Rod Can, MD;  Location: [facility];  Service: Orthopedics;  Laterality: Left;   TOTAL KNEE ARTHROPLASTY     right   TOTAL KNEE REVISION Right 01/04/2019   Procedure: TOTAL KNEE REVISION;  Surgeon: Gaynelle Arabian, MD;  Location: [facility];  Service: Orthopedics;  Laterality: Right;  124mn with block    Current Outpatient Medications  Medication Sig Dispense Refill   amLODipine (NORVASC) 2.5  MG tablet Take 1 tablet (2.5 mg total) by mouth daily. Keep upcoming appointment for  future refills. 90 tablet 3   Cholecalciferol (VITAMIN D3 PO) Take 2,000 Int'l Units by mouth daily.     donepezil (ARICEPT) 10 MG tablet Take 1 tablet (10 mg total) by mouth at bedtime. 90 tablet 4   furosemide (LASIX) 20 MG tablet Take 1 tablet (20 mg total) by mouth daily as needed for fluid or edema. 20 tablet 3   ibandronate (BONIVA) 150 MG tablet Take 150 mg by mouth every 30 (thirty) days. Pt takes on the 25th of each month (Is on hold)     levothyroxine (SYNTHROID) 137 MCG tablet Take 137 mcg by mouth daily before breakfast.     traMADol (ULTRAM) 50 MG tablet Take 1 tablet (50 mg total) by mouth 2 (two) times daily as needed. 20 tablet 0   No current facility-administered medications for this visit.    Allergies as of 10/07/2022 - Review Complete 10/07/2022  Allergen Reaction Noted   Percocet [oxycodone-acetaminophen] Nausea And Vomiting 09/06/2019    Vitals: BP (!) 142/64   Pulse 60   Ht '5\' 4"'$  (1.626 m)   Wt 173 lb 6.4 oz (78.7 kg)   BMI 29.76 kg/m  Last Weight:  Wt Readings from Last 1 Encounters:  10/07/22 173 lb 6.4 oz (78.7 kg)   Last Height:   Ht Readings from Last 1 Encounters:  10/07/22 '5\' 4"'$  (1.626 m)      10/07/2022   11:30 AM 10/06/2021    9:25 AM 05/21/2021   11:41 AM  MMSE - Mini Mental State Exam  Orientation to time '5 5 4  '$ Orientation to Place '5 5 5  '$ Registration '3 3 3  '$ Attention/ Calculation 1 0 1  Recall '3 3 2  '$ Language- name 2 objects '2 2 2  '$ Language- repeat '1 1 1  '$ Language- follow 3 step command '3 3 3  '$ Language- read & follow direction '1 1 1  '$ Write a sentence '1 1 1  '$ Copy design '1 1 1  '$ Total score '26 25 24  '$ Exam: NAD, pleasant                  Speech:    Speech is normal; fluent and spontaneous with normal comprehension.  Cognition:    The patient is oriented to person, place, and time;     recent and remote memory intact;     language fluent;    Cranial  Nerves:    The pupils are equal, round, and reactive to light.Trigeminal sensation is intact and the muscles of mastication are normal. The face is symmetric. The palate elevates in the midline. Hearing intact. Voice is normal. Shoulder shrug is normal. The tongue has normal motion without fasciculations.   Coordination:  No dysmetria  Motor Observation:    No asymmetry, no atrophy, and no involuntary movements noted. Tone:    Normal muscle tone.     Strength:    Strength is V/V in the upper and lower limbs.      Sensation: intact to LT  Gait: antalgic with a cane  Assessment/Plan: This is an extremely lovely 82 year old patient here with her equally lovely famly for concerns of memory loss or more appropriately possibly poor judgment.  She is 82 years old, she is quite independent, she still drives, she lives with her husband, however I am getting concerned when I hear things like she has been the victim of scammer, daughter is concerned for her getting lost at times and worries about her driving, I do think possibly  she has some impaired judgment and insight,  I am not quite sure patient is a good historian and she clearly does not like being at the appointment or questioned on her memory or driving. Her son-in-lase is Mardene Sayer, a pcp. However there is no evidence of a neurocognitive disorder, her MMSEs have been stable if not improved, multiple neurocognitive testing has not diagnosed Alzheimer's or Lewy body so we will continue to follow. Daughter is concerned and suggested lexapro or namenda but patient declines.  - Discussed repeating formal memory testing from 2022 "We reviewed the results of the recent neuropsychological evaluation which were quite encouraging and showed no indication of any progressive type of dementia process and is clearly not consistent with a progressive dementia such as Alzheimer's or Lewy body type of condition." Dr Sima Matas and she declines at this  time. Discussed Namenda which is another medication but if stable I wouldn;t start that/ Also discussed lecanemab if needed in the future at this time I wouldn;t recommend anything new and she declines anything new or mor etestinv, just clinically monitoring.    - Dizziness: Improved per patient.  I do think this is mild orthostasis and I agree with the physician that patient needs to stand up carefully, wear compression stockings(she is not doing that today although she was informed to), manage blood pressure with primary care, and for the vertiginous symptoms we recommended vestibular therapy but she di dnot follow through.   - Continue aricept  - I am concerned about her memory loss but multiple formal neurocognitive testings did not show a neurodegenerative disorder and her MMSEs improved. She wants to follow up as neede.  - I reviewed sleep study, dated August 2021, borderline sleep apnea, no indication for CPAP or any other intervention for sleep disordered breathing. Her husband was diagnosed with sleep apnea cannot tolerate the cpap did I send note to casey/dohmeier about aspire or dental device?  - She declined a driving test. Daughter will monitor and place video monitoring in the car  - Patient is never really happy with being at this appointment and being questioned on her driving or memory. She does not want to come back, only as needed  - We sent to [facility] for aquatherapy for her gait abnormality, imbalance and lower extremity weakness.   PRIOR Appointment A/P: - MRI of the brain for reversible causes of dementia: Atrophy and chronic microvascular changes. -  findings of formal memory testing which were not consistent with degenerative dementia such as Alzheimer's, Lewy body or other cortical dementia, there was some pattern of lateralization to performance with almost all her deficits in the areas of visual processing and visual cognitive functioning -May consider FDG PET scan  in the future.  They are set up for repeat testing in 9 months and I do think that it is appropriate to wait and retest formal memory at that time.  Return to clinic after that.  Cc: Lawerance Cruel, MD  Sarina Ill, MD  [facility] [address] [place], [place] [address]  Phone [phone] Fax [phone]  I spent 30 minutes of face-to-face and non-face-to-face time with patient on the  1. Mild cognitive impairment    diagnosis.  This included previsit chart review, lab review, study review, order entry, electronic health record documentation, patient education on the different diagnostic and therapeutic options, counseling and coordination of care, risks and benefits of management, compliance, or risk factor reduction

## 2022-10-09 DIAGNOSIS — I5030 Unspecified diastolic (congestive) heart failure: Secondary | ICD-10-CM | POA: Diagnosis not present

## 2022-10-09 DIAGNOSIS — G3184 Mild cognitive impairment, so stated: Secondary | ICD-10-CM | POA: Diagnosis not present

## 2022-10-09 DIAGNOSIS — Z6830 Body mass index (BMI) 30.0-30.9, adult: Secondary | ICD-10-CM | POA: Diagnosis not present

## 2022-10-09 DIAGNOSIS — I110 Hypertensive heart disease with heart failure: Secondary | ICD-10-CM | POA: Diagnosis not present

## 2022-10-09 DIAGNOSIS — Z79899 Other long term (current) drug therapy: Secondary | ICD-10-CM | POA: Diagnosis not present

## 2022-10-09 DIAGNOSIS — R2689 Other abnormalities of gait and mobility: Secondary | ICD-10-CM | POA: Diagnosis not present

## 2022-10-09 DIAGNOSIS — E039 Hypothyroidism, unspecified: Secondary | ICD-10-CM | POA: Diagnosis not present

## 2022-10-09 DIAGNOSIS — M810 Age-related osteoporosis without current pathological fracture: Secondary | ICD-10-CM | POA: Diagnosis not present

## 2022-10-22 DIAGNOSIS — R2689 Other abnormalities of gait and mobility: Secondary | ICD-10-CM | POA: Diagnosis not present

## 2022-10-22 DIAGNOSIS — M6281 Muscle weakness (generalized): Secondary | ICD-10-CM | POA: Diagnosis not present

## 2022-10-28 DIAGNOSIS — M6281 Muscle weakness (generalized): Secondary | ICD-10-CM | POA: Diagnosis not present

## 2022-10-28 DIAGNOSIS — R2689 Other abnormalities of gait and mobility: Secondary | ICD-10-CM | POA: Diagnosis not present

## 2022-11-03 DIAGNOSIS — R2689 Other abnormalities of gait and mobility: Secondary | ICD-10-CM | POA: Diagnosis not present

## 2022-11-05 DIAGNOSIS — R2689 Other abnormalities of gait and mobility: Secondary | ICD-10-CM | POA: Diagnosis not present

## 2022-11-10 DIAGNOSIS — R2689 Other abnormalities of gait and mobility: Secondary | ICD-10-CM | POA: Diagnosis not present

## 2022-11-12 DIAGNOSIS — R2689 Other abnormalities of gait and mobility: Secondary | ICD-10-CM | POA: Diagnosis not present

## 2022-11-17 DIAGNOSIS — R2689 Other abnormalities of gait and mobility: Secondary | ICD-10-CM | POA: Diagnosis not present

## 2022-11-19 DIAGNOSIS — R2689 Other abnormalities of gait and mobility: Secondary | ICD-10-CM | POA: Diagnosis not present

## 2023-01-07 ENCOUNTER — Ambulatory Visit
Admission: RE | Admit: 2023-01-07 | Discharge: 2023-01-07 | Disposition: A | Discharge status: Home / Self Care | Payer: Medicare PPO | Source: Ambulatory Visit | Attending: Family Medicine | Admitting: Family Medicine

## 2023-01-07 DIAGNOSIS — Z90722 Acquired absence of ovaries, bilateral: Secondary | ICD-10-CM | POA: Diagnosis not present

## 2023-01-07 DIAGNOSIS — N951 Menopausal and female climacteric states: Secondary | ICD-10-CM | POA: Diagnosis not present

## 2023-01-07 DIAGNOSIS — E039 Hypothyroidism, unspecified: Secondary | ICD-10-CM | POA: Diagnosis not present

## 2023-01-07 DIAGNOSIS — M810 Age-related osteoporosis without current pathological fracture: Secondary | ICD-10-CM

## 2023-01-07 DIAGNOSIS — Z90710 Acquired absence of both cervix and uterus: Secondary | ICD-10-CM | POA: Diagnosis not present

## 2023-01-07 DIAGNOSIS — E349 Endocrine disorder, unspecified: Secondary | ICD-10-CM | POA: Diagnosis not present

## 2023-01-21 NOTE — Therapy (Signed)
OUTPATIENT PHYSICAL THERAPY LOWER EXTREMITY EVALUATION   Patient Name: Terri Ortega MRN: [id number] DOB:12-24-1940, 82 y.o., female Today's Date: 01/23/2023  END OF SESSION:  PT End of Session - 01/22/23 1437     Visit Number 1    Number of Visits 16    Date for PT Re-Evaluation 03/20/23    PT Start Time 1430    PT Stop Time 1512    PT Time Calculation (min) 42 min    Activity Tolerance Patient tolerated treatment well    Behavior During Therapy [facility] for tasks assessed/performed             Past Medical History:  Diagnosis Date   Graves disease    HTN (hypertension)    Low back pain    Dr. Yetta Barre   Osteoporosis    Pneumonia 2012   Radiation 03/20/14, 03/27/14, 04/05/14, 04/12/14, 04/17/14   bracytherapy to proximal vagina 30 gray   Scoliosis    Uterine cancer (HCC)    surgery and radiation x 5 treatments-last 9'15   Vitamin D deficiency    Past Surgical History:  Procedure Laterality Date   BUNIONECTOMY     left   CATARACT EXTRACTION, BILATERAL Bilateral    last done 11-21-14   COLONOSCOPY WITH PROPOFOL N/A 12/25/2014   Procedure: COLONOSCOPY WITH PROPOFOL;  Surgeon: Charolett Bumpers, MD;  Location: [facility];  Service: Endoscopy;  Laterality: N/A;   FOOT SURGERY Left 2004   Dr Ninetta Lights   JOINT REPLACEMENT     RTKA   ROBOTIC ASSISTED TOTAL HYSTERECTOMY WITH BILATERAL SALPINGO OOPHERECTOMY  01/30/14   with lymph node biopsy   TOTAL HIP ARTHROPLASTY Left 04/17/2022   Procedure: TOTAL HIP ARTHROPLASTY ANTERIOR APPROACH;  Surgeon: Samson Frederic, MD;  Location: [facility];  Service: Orthopedics;  Laterality: Left;   TOTAL KNEE ARTHROPLASTY     right   TOTAL KNEE REVISION Right 01/04/2019   Procedure: TOTAL KNEE REVISION;  Surgeon: Ollen Gross, MD;  Location: [facility];  Service: Orthopedics;  Laterality: Right;  with block   Patient Active Problem List   Diagnosis Date Noted   Nausea 04/27/2022   Blood loss anemia 04/24/2022   Slow transit constipation  04/24/2022   Subcapital fracture of femur, left, closed, initial encounter (HCC) 04/15/2022   Fall at home, initial encounter 04/15/2022   CKD (chronic kidney disease) stage 3, GFR 30-59 ml/min (HCC) 04/15/2022   Hypertensive urgency 04/15/2022   Osteoporosis 04/15/2022   Aortic valve disease 07/10/2021   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   Orthostatic dizziness 11/14/2020   Educated about COVID-19 virus infection 12/21/2019   Mild cognitive impairment 09/06/2019   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   Preop cardiovascular exam 12/20/2018   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   Uterine cancer (HCC) 03/28/2018   Arthralgia of hip or thigh 03/28/2018   Benign essential HTN 03/28/2018   Overweight 03/28/2018   Heart disease 11/09/2014   Leukocytosis 10/31/2014   Diastolic congestive heart failure (HCC) 10/30/2014   Hypothyroidism 10/30/2014   Chest pain at rest 10/30/2014   Chest pain 10/30/2014   Endometrial cancer (HCC) 01/30/2014   Dyspnea 01/11/2014   Edema 01/11/2014   Low back pain with right-sided sciatica 12/13/2013   Degeneration of lumbar or lumbosacral intervertebral disc 12/13/2013   Sciatica 12/13/2013    PCP: Dr Darlen Round   REFERRING PROVIDER: Dr Samson Frederic  REFERRING DIAG:  Diagnosis  M62.81 (ICD-10-CM) - Muscle weakness (generalized)    THERAPY DIAG:  Other abnormalities of gait and mobility  Muscle weakness (generalized)  Stiffness of left hip, not elsewhere classified  Pain in left hip  Rationale for Evaluation and Treatment: Rehabilitation  ONSET DATE: 04/18/2023  SUBJECTIVE:   SUBJECTIVE STATEMENT:   PERTINENT HISTORY: Osteopetrosis, Low back pain, history of THA, graves disease  PAIN:  Are you having pain? Yes: NPRS scale: 4/10 at the end of the day  Pain location: left lateral hip  Pain  description: aching Aggravating factors: as the day goes on Relieving factors: rest   PRECAUTIONS: Fall  WEIGHT BEARING RESTRICTIONS: No   FALLS:  Has patient fallen in last 6 months? No  LIVING ENVIRONMENT: 4 steps to the front door with side rails OCCUPATION:  Retired   Presenter, broadcasting:  Walking   PLOF: Independent  PATIENT GOALS:  The patient would like to return to walking   NEXT MD VISIT: 3-4 months    OBJECTIVE:   DIAGNOSTIC FINDINGS:  X-ray: nothing acute per X ray   PATIENT SURVEYS:  FOTO    COGNITION: Overall cognitive status: Within functional limits for tasks assessed     SENSATION: WFL  EDEMA:  Swelling noted in the left ankle   MUSCLE LENGTH:  POSTURE: No Significant postural limitations  PALPATION: Mild tenderness to palpation in the left hip   LOWER EXTREMITY ROM:  Passive ROM Right eval Left eval  Hip flexion    Hip extension    Hip abduction    Hip adduction    Hip internal rotation    Hip external rotation    Knee flexion    Knee extension    Ankle dorsiflexion    Ankle plantarflexion    Ankle inversion    Ankle eversion     (Blank rows = not tested)  LOWER EXTREMITY MMT:  MMT Right eval Left eval  Hip flexion 14.0 12.0  Hip extension    Hip abduction 17.7 16  Hip adduction    Hip internal rotation    Hip external rotation    Knee flexion    Knee extension 16.7 21.8  Ankle dorsiflexion    Ankle plantarflexion    Ankle inversion    Ankle eversion     (Blank rows = not tested)    GAIT: Decreased single leg stance time on the left; lateral movement away from left LE;   TODAY'S TREATMENT:                                                                                                                              DATE: l  PATIENT EDUCATION:  Education details: reviewed HEP, symptom management  Person educated: Patient Education method: Explanation, Demonstration, Tactile cues, Verbal cues, and Handouts Education  comprehension: verbalized understanding, returned demonstration, verbal cues required, tactile cues required, and needs further education  HOME EXERCISE PROGRAM: Access Code: [email or web address] URL: [email or web address]  Date: 01/22/2023 Prepared by: Lorayne Bender  Exercises - Supine Quad Set  - 5 x daily - 7 x weekly - 3 sets - 10 reps - Seated Ankle Pumps  - 5 x daily - 7 x weekly - 3 sets - 10 reps - Supine Gluteal Sets  - 5 x daily - 7 x weekly - 3 sets - 10 reps  ASSESSMENT:  CLINICAL IMPRESSION:  Patient is an 82 year old female S/P left hip replacement on 04/18/2023. She had an episode of physical therapy. She was doing better but does not feel like she has returned to her prior level of function. She has progressed off a cane. She feels like when she stands for a period of time she has increased hip pain. She has limited gross L LE strength. She would benefit from skilled physical therapy to improve her ability to ambulate. She has admitted to not doing her exercises often. She was advised if this is going to work she needs to find some type of routine.   OBJECTIVE IMPAIRMENTS: Abnormal gait, decreased knowledge of use of DME, decreased mobility, difficulty walking, decreased ROM, decreased strength, and pain.   ACTIVITY LIMITATIONS: bending, sitting, standing, squatting, stairs, and locomotion level  PARTICIPATION LIMITATIONS: meal prep, cleaning, laundry, driving, shopping, community activity, and yard work  PERSONAL FACTORS: Age and 1-2 comorbidities: Low back pain, graves disease  are also affecting patient's functional outcome.   REHAB POTENTIAL: Good  CLINICAL DECISION MAKING: Evolving/moderate complexity  EVALUATION COMPLEXITY: Low declining ability to ambulate    GOALS: Goals reviewed with patient? Yes  SHORT TERM GOALS: Target date: 02/19/2023   Patient will report doing her exercises at least 3-4 times a week Baseline: Goal status: INITIAL  2.  Patient  will increase gross left lower extremity strength by 5 pounds Baseline:  Goal status: INITIAL  3.  Patient will increase left hip external rotation by 5 degrees Baseline:  Goal status: INITIAL   LONG TERM GOALS: Target date: 03/19/2023    Patient report no pain at the end of the day after performing activities Baseline:  Goal status: INITIAL  2.  Patient will go up and down 8 steps with a reciprocal gait pattern without increased pain Baseline:  Goal status: INITIAL  3.  Patient will ambulate community distances without increased pain Baseline:  Goal status: INITIAL    PLAN:  PT FREQUENCY: 2x/week  PT DURATION: 8 weeks  PLANNED INTERVENTIONS: Therapeutic exercises, Therapeutic activity, Neuromuscular re-education, Balance training, Gait training, Patient/Family education, Self Care, Joint mobilization, Stair training, DME instructions, Aquatic Therapy, Cryotherapy, Moist heat, Ultrasound, Ionotophoresis 4mg /ml Dexamethasone, and Manual therapy  PLAN FOR NEXT SESSION:   Review HEP, keep home program short but make sure we are loading properly. She was advised to make an exercise routine. Work on balance exercises as well and dynamic gait activity   Referring diagnosis? M62.81 (ICD-10-CM) - Muscle weakness (generalized) Treatment diagnosis? (if different than referring diagnosis)  What was this (referring dx) caused by? [x]  Surgery []  Fall []  Ongoing issue []  Arthritis []  Other: ____________  Laterality: []  Rt [x]  Lt []  Both  Check all possible CPT codes:  *CHOOSE 10 OR LESS*    []  97110 (Therapeutic Exercise)  []  32440 (SLP Treatment)  []  97112 (Neuro Re-ed)   []  92526 (Swallowing Treatment)   []  97116 (Gait Training)   []  [id number] (Cognitive Training, 1st 15 minutes) []  97140 (Manual Therapy)   []  97130 (Cognitive Training, each add'l 15 minutes)  []   57846 (Re-evaluation)                              []  Other, List CPT Code ____________  []  97530 (Therapeutic  Activities)     []  97535 (Self Care)   [x]  All codes above (97110 - 97535)  []  97012 (Mechanical Traction)  []  97014 (E-stim Unattended)  []  97032 (E-stim manual)  []  97033 (Ionto)  []  97035 (Ultrasound) []  97750 (Physical Performance Training) []  [id number] (Aquatic Therapy) []  97016 (Vasopneumatic Device) []  [id number] (Paraffin) []  97034 (Contrast Bath) []  97597 (Wound Care 1st 20 sq cm) []  97598 (Wound Care each add'l 20 sq cm) []  97760 (Orthotic Fabrication, Fitting, Training Initial) []  [id number] (Prosthetic Management and Training Initial) []  [id number] (Orthotic or Prosthetic Training/ Modification Subsequent)   Dessie Coma, PT 01/23/2023, 10:34 AM

## 2023-01-22 ENCOUNTER — Ambulatory Visit (HOSPITAL_BASED_OUTPATIENT_CLINIC_OR_DEPARTMENT_OTHER): Payer: Medicare PPO | Attending: Orthopedic Surgery | Admitting: Physical Therapy

## 2023-01-22 ENCOUNTER — Encounter (HOSPITAL_BASED_OUTPATIENT_CLINIC_OR_DEPARTMENT_OTHER): Payer: Self-pay | Admitting: Physical Therapy

## 2023-01-22 DIAGNOSIS — R2689 Other abnormalities of gait and mobility: Secondary | ICD-10-CM | POA: Insufficient documentation

## 2023-01-22 DIAGNOSIS — M25552 Pain in left hip: Secondary | ICD-10-CM | POA: Insufficient documentation

## 2023-01-22 DIAGNOSIS — M25652 Stiffness of left hip, not elsewhere classified: Secondary | ICD-10-CM | POA: Diagnosis not present

## 2023-01-22 DIAGNOSIS — M6281 Muscle weakness (generalized): Secondary | ICD-10-CM | POA: Insufficient documentation

## 2023-01-23 ENCOUNTER — Encounter (HOSPITAL_BASED_OUTPATIENT_CLINIC_OR_DEPARTMENT_OTHER): Payer: Self-pay | Admitting: Physical Therapy

## 2023-01-28 ENCOUNTER — Ambulatory Visit (HOSPITAL_BASED_OUTPATIENT_CLINIC_OR_DEPARTMENT_OTHER): Payer: Medicare PPO

## 2023-01-28 ENCOUNTER — Encounter (HOSPITAL_BASED_OUTPATIENT_CLINIC_OR_DEPARTMENT_OTHER): Payer: Self-pay

## 2023-01-28 DIAGNOSIS — M6281 Muscle weakness (generalized): Secondary | ICD-10-CM

## 2023-01-28 DIAGNOSIS — M25552 Pain in left hip: Secondary | ICD-10-CM | POA: Diagnosis not present

## 2023-01-28 DIAGNOSIS — M25652 Stiffness of left hip, not elsewhere classified: Secondary | ICD-10-CM

## 2023-01-28 DIAGNOSIS — R2689 Other abnormalities of gait and mobility: Secondary | ICD-10-CM

## 2023-01-28 NOTE — Therapy (Signed)
OUTPATIENT PHYSICAL THERAPY LOWER EXTREMITY TREATMENT   Patient Name: Terri Ortega MRN: [id number] DOB:Jun 05, 1941, 82 y.o., female Today's Date: 01/28/2023  END OF SESSION:  PT End of Session - 01/28/23 1628     Visit Number 2    Number of Visits 16    Date for PT Re-Evaluation 03/20/23    PT Start Time 1518    PT Stop Time 1600    PT Time Calculation (min) 42 min    Activity Tolerance Patient tolerated treatment well    Behavior During Therapy [facility] for tasks assessed/performed              Past Medical History:  Diagnosis Date   Graves disease    HTN (hypertension)    Low back pain    Dr. Yetta Barre   Osteoporosis    Pneumonia 2012   Radiation 03/20/14, 03/27/14, 04/05/14, 04/12/14, 04/17/14   bracytherapy to proximal vagina 30 gray   Scoliosis    Uterine cancer (HCC)    surgery and radiation x 5 treatments-last 9'15   Vitamin D deficiency    Past Surgical History:  Procedure Laterality Date   BUNIONECTOMY     left   CATARACT EXTRACTION, BILATERAL Bilateral    last done 11-21-14   COLONOSCOPY WITH PROPOFOL N/A 12/25/2014   Procedure: COLONOSCOPY WITH PROPOFOL;  Surgeon: Charolett Bumpers, MD;  Location: [facility];  Service: Endoscopy;  Laterality: N/A;   FOOT SURGERY Left 2004   Dr Ninetta Lights   JOINT REPLACEMENT     RTKA   ROBOTIC ASSISTED TOTAL HYSTERECTOMY WITH BILATERAL SALPINGO OOPHERECTOMY  01/30/14   with lymph node biopsy   TOTAL HIP ARTHROPLASTY Left 04/17/2022   Procedure: TOTAL HIP ARTHROPLASTY ANTERIOR APPROACH;  Surgeon: Samson Frederic, MD;  Location: [facility] OR;  Service: Orthopedics;  Laterality: Left;   TOTAL KNEE ARTHROPLASTY     right   TOTAL KNEE REVISION Right 01/04/2019   Procedure: TOTAL KNEE REVISION;  Surgeon: Ollen Gross, MD;  Location: [facility];  Service: Orthopedics;  Laterality: Right;  with block   Patient Active Problem List   Diagnosis Date Noted   Nausea 04/27/2022   Blood loss anemia 04/24/2022   Slow transit constipation  04/24/2022   Subcapital fracture of femur, left, closed, initial encounter (HCC) 04/15/2022   Fall at home, initial encounter 04/15/2022   CKD (chronic kidney disease) stage 3, GFR 30-59 ml/min (HCC) 04/15/2022   Hypertensive urgency 04/15/2022   Osteoporosis 04/15/2022   Aortic valve disease 07/10/2021   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   Orthostatic dizziness 11/14/2020   Educated about COVID-19 virus infection 12/21/2019   Mild cognitive impairment 09/06/2019   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   Preop cardiovascular exam 12/20/2018   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   Uterine cancer (HCC) 03/28/2018   Arthralgia of hip or thigh 03/28/2018   Benign essential HTN 03/28/2018   Overweight 03/28/2018   Heart disease 11/09/2014   Leukocytosis 10/31/2014   Diastolic congestive heart failure (HCC) 10/30/2014   Hypothyroidism 10/30/2014   Chest pain at rest 10/30/2014   Chest pain 10/30/2014   Endometrial cancer (HCC) 01/30/2014   Dyspnea 01/11/2014   Edema 01/11/2014   Low back pain with right-sided sciatica 12/13/2013   Degeneration of lumbar or lumbosacral intervertebral disc 12/13/2013   Sciatica 12/13/2013    PCP: Dr Darlen Round   REFERRING PROVIDER: Dr Samson Frederic  REFERRING DIAG:  Diagnosis  M62.81 (ICD-10-CM) - Muscle weakness (generalized)    THERAPY DIAG:  Other abnormalities of gait and mobility  Stiffness of left hip, not elsewhere classified  Muscle weakness (generalized)  Pain in left hip  Rationale for Evaluation and Treatment: Rehabilitation  ONSET DATE: 04/18/2023  SUBJECTIVE:   SUBJECTIVE STATEMENT: Pt reports no pain at entry. No recent falls. Has trouble getting socks and shoes on.   PERTINENT HISTORY: Osteopetrosis, Low back pain, history of THA, graves disease  PAIN:  Are you having pain? Yes:  NPRS scale: 4/10 at the end of the day  Pain location: left lateral hip  Pain description: aching Aggravating factors: as the day goes on Relieving factors: rest   PRECAUTIONS: Fall  WEIGHT BEARING RESTRICTIONS: No   FALLS:  Has patient fallen in last 6 months? No  LIVING ENVIRONMENT: 4 steps to the front door with side rails OCCUPATION:  Retired   Presenter, broadcasting:  Walking   PLOF: Independent  PATIENT GOALS:  The patient would like to return to walking   NEXT MD VISIT: 3-4 months    OBJECTIVE:   DIAGNOSTIC FINDINGS:  X-ray: nothing acute per X ray   PATIENT SURVEYS:  FOTO    COGNITION: Overall cognitive status: Within functional limits for tasks assessed     SENSATION: WFL  EDEMA:  Swelling noted in the left ankle   MUSCLE LENGTH:  POSTURE: No Significant postural limitations  PALPATION: Mild tenderness to palpation in the left hip   LOWER EXTREMITY ROM:  Passive ROM Right eval Left eval  Hip flexion    Hip extension    Hip abduction    Hip adduction    Hip internal rotation    Hip external rotation    Knee flexion    Knee extension    Ankle dorsiflexion    Ankle plantarflexion    Ankle inversion    Ankle eversion     (Blank rows = not tested)  LOWER EXTREMITY MMT:  MMT Right eval Left eval  Hip flexion 14.0 12.0  Hip extension    Hip abduction 17.7 16  Hip adduction    Hip internal rotation    Hip external rotation    Knee flexion    Knee extension 16.7 21.8  Ankle dorsiflexion    Ankle plantarflexion    Ankle inversion    Ankle eversion     (Blank rows = not tested)    GAIT: Decreased single leg stance time on the left; lateral movement away from left LE;   TODAY'S TREATMENT:                                                                                                                              DATE: 6/27: -PROM L hip focus on ER -Supine marching 1.5# 2x10 -hooklying adductor squeeze 5s x20 -Supine SLR 2x10 (manual  assist with first set) -Sidelying clam 2x10L  -Bridges x20 -LAQ- 4# 5" hold  2x10 L -Standing hip 3 way RTB at ankles- 2x10ea/bil -Standing march x10 (difficult) -Sit to stands x10  HEP update and review   PATIENT EDUCATION:  Education details: reviewed HEP, symptom management  Person educated: Patient Education method: Explanation, Demonstration, Tactile cues, Verbal cues, and Handouts Education comprehension: verbalized understanding, returned demonstration, verbal cues required, tactile cues required, and needs further education  HOME EXERCISE PROGRAM: Access Code: [email or web address] URL: [email or web address] Date: 01/22/2023 Prepared by: Lorayne Bender  Exercises - Supine Quad Set  - 5 x daily - 7 x weekly - 3 sets - 10 reps - Seated Ankle Pumps  - 5 x daily - 7 x weekly - 3 sets - 10 reps - Supine Gluteal Sets  - 5 x daily - 7 x weekly - 3 sets - 10 reps  ASSESSMENT:  CLINICAL IMPRESSION:  Educated pt on importance of completing HEP. Suggested she use a timer for 2 minute intervals as opposed to counting if that is easier for her.  Pt has significant tightness in L hip ER and has difficulty donning socks and shoes, unable to independently. Spent time on PROM for ER with manual stretching. Overall good tolerance for strengthening interventions. She is most challenged by standing marching-demonstrating trendelenburg drop and fwd trunk flexion. Pt will benefit from continued PT to improve ROM and strength. Will monitor HEP compliance.   OBJECTIVE IMPAIRMENTS: Abnormal gait, decreased knowledge of use of DME, decreased mobility, difficulty walking, decreased ROM, decreased strength, and pain.   ACTIVITY LIMITATIONS: bending, sitting, standing, squatting, stairs, and locomotion level  PARTICIPATION LIMITATIONS: meal prep, cleaning, laundry, driving, shopping, community activity, and yard work  PERSONAL FACTORS: Age and 1-2 comorbidities: Low back pain, graves disease  are  also affecting patient's functional outcome.   REHAB POTENTIAL: Good  CLINICAL DECISION MAKING: Evolving/moderate complexity  EVALUATION COMPLEXITY: Low declining ability to ambulate    GOALS: Goals reviewed with patient? Yes  SHORT TERM GOALS: Target date: 02/19/2023   Patient will report doing her exercises at least 3-4 times a week Baseline: Goal status: INITIAL  2.  Patient will increase gross left lower extremity strength by 5 pounds Baseline:  Goal status: INITIAL  3.  Patient will increase left hip external rotation by 5 degrees Baseline:  Goal status: INITIAL   LONG TERM GOALS: Target date: 03/19/2023    Patient report no pain at the end of the day after performing activities Baseline:  Goal status: INITIAL  2.  Patient will go up and down 8 steps with a reciprocal gait pattern without increased pain Baseline:  Goal status: INITIAL  3.  Patient will ambulate community distances without increased pain Baseline:  Goal status: INITIAL    PLAN:  PT FREQUENCY: 2x/week  PT DURATION: 8 weeks  PLANNED INTERVENTIONS: Therapeutic exercises, Therapeutic activity, Neuromuscular re-education, Balance training, Gait training, Patient/Family education, Self Care, Joint mobilization, Stair training, DME instructions, Aquatic Therapy, Cryotherapy, Moist heat, Ultrasound, Ionotophoresis 4mg /ml Dexamethasone, and Manual therapy  PLAN FOR NEXT SESSION:   Review HEP, keep home program short but make sure we are loading properly. She was advised to make an exercise routine. Work on balance exercises as well and dynamic gait activity   Referring diagnosis? M62.81 (ICD-10-CM) - Muscle weakness (generalized) Treatment diagnosis? (if different than referring diagnosis)  What was this (referring dx) caused by? [x]  Surgery []  Fall []  Ongoing issue []  Arthritis []  Other: ____________  Laterality: []  Rt [x]  Lt []  Both  Check all possible CPT codes:  *CHOOSE 10 OR  LESS*    []  [id number] (Therapeutic Exercise)  []  92507 (SLP Treatment)  []  [id number] (Neuro Re-ed)   []  92526 (Swallowing Treatment)   []  97116 (Gait Training)   []  [phone] (Cognitive Training, 1st 15 minutes) []  97140 (Manual Therapy)   []  97130 (Cognitive Training, each add'l 15 minutes)  []  40102 (Re-evaluation)                              []  Other, List CPT Code ____________  []  97530 (Therapeutic Activities)     []  97535 (Self Care)   [x]  All codes above (97110 - 97535)  []  97012 (Mechanical Traction)  []  97014 (E-stim Unattended)  []  97032 (E-stim manual)  []  97033 (Ionto)  []  97035 (Ultrasound) []  97750 (Physical Performance Training) []  [id number] (Aquatic Therapy) []  97016 (Vasopneumatic Device) []  [id number] (Paraffin) []  97034 (Contrast Bath) []  97597 (Wound Care 1st 20 sq cm) []  97598 (Wound Care each add'l 20 sq cm) []  97760 (Orthotic Fabrication, Fitting, Training Initial) []  [id number] (Prosthetic Management and Training Initial) []  [phone] (Orthotic or Prosthetic Training/ Modification Subsequent)   Donnel Saxon Montavius Subramaniam, PTA 01/28/2023, 4:41 PM

## 2023-01-30 IMAGING — CT CT HEAD W/O CM
1 series · 15 of 30 positions shown, 19 images · non-contrast
Comparison: Brain MRI 10/23/2019.

CLINICAL DATA: Dizziness. Additional history provided by scanning
technologist: Patient reports slight headaches, dizziness, history
of uterine cancer status post total hysterectomy and radiation
therapy.

EXAM:
CT HEAD WITHOUT CONTRAST
TECHNIQUE: Contiguous axial images were obtained from the base of the skull
through the vertex without intravenous contrast.

[Series 2: head w/(date) · axial · 0.43mm/px · z∈[-168,-8]mm · 15 of 36 slices shown, 19 images]
[im 2/36  brain]
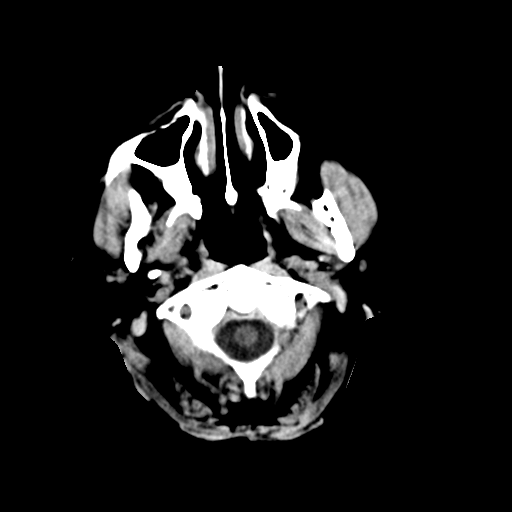
[im 2/36  bone]
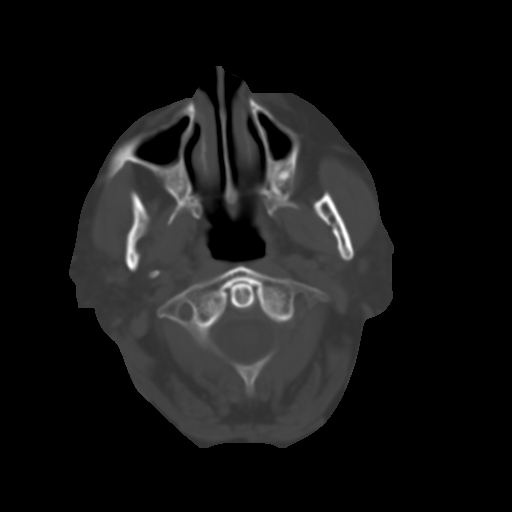
[im 4/36  brain]
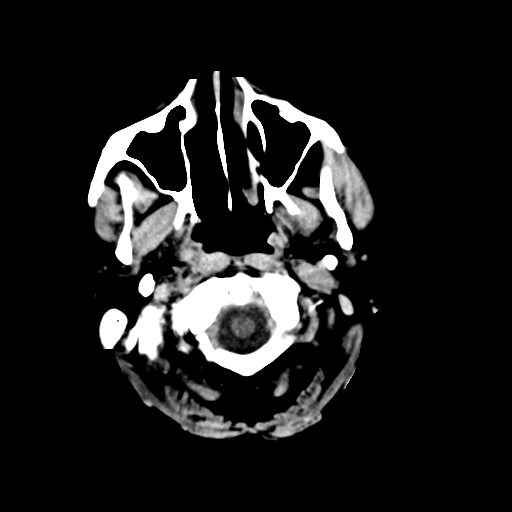
[im 7/36  brain]
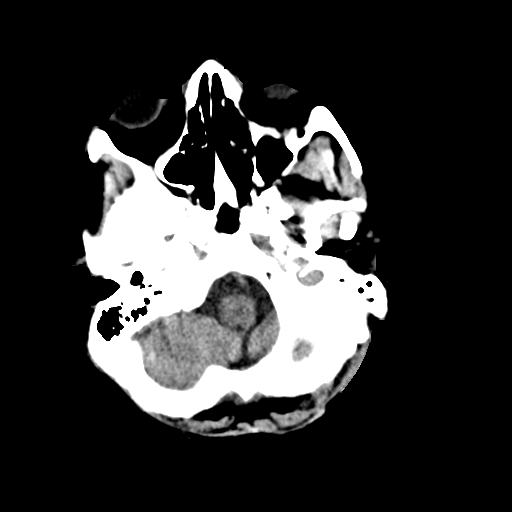
[im 9/36  brain]
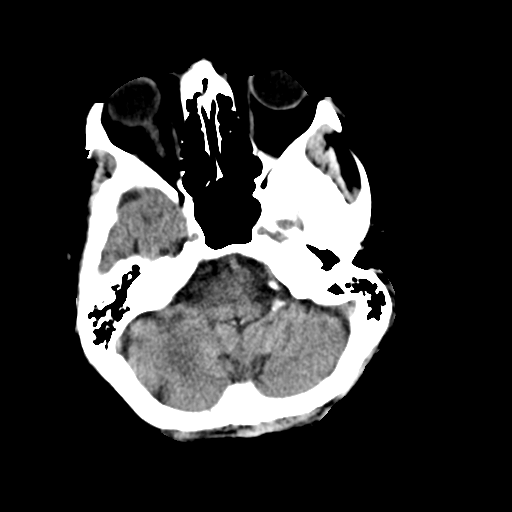
[im 11/36  brain]
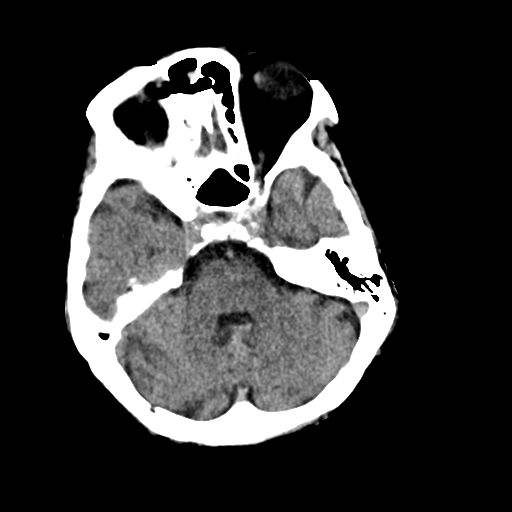
[im 11/36  bone]
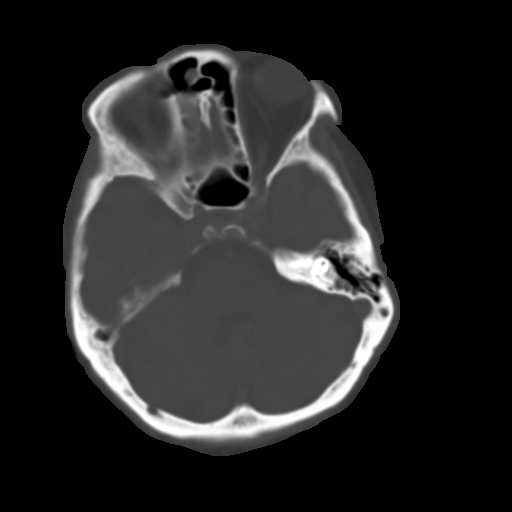
[im 14/36  brain]
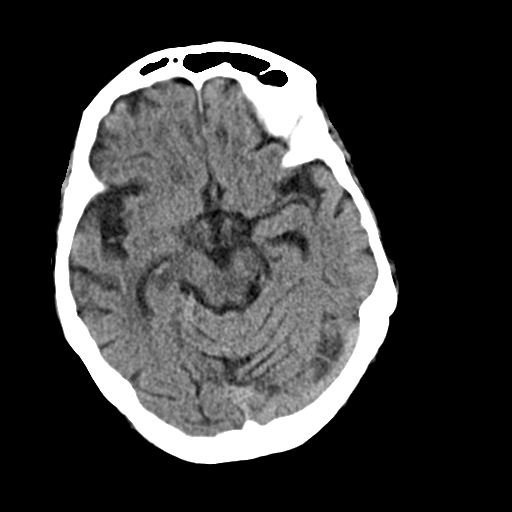
[im 16/36  brain]
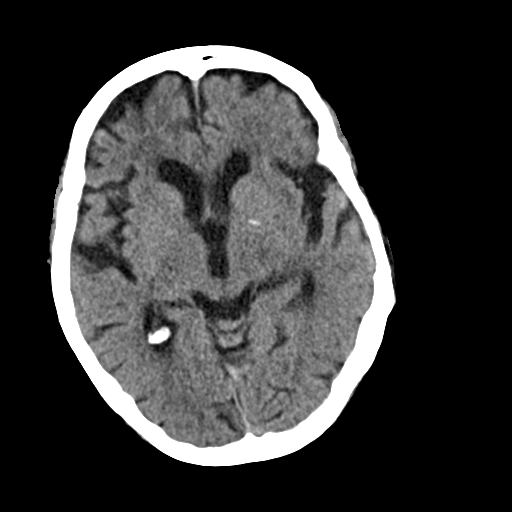
[im 19/36  brain]
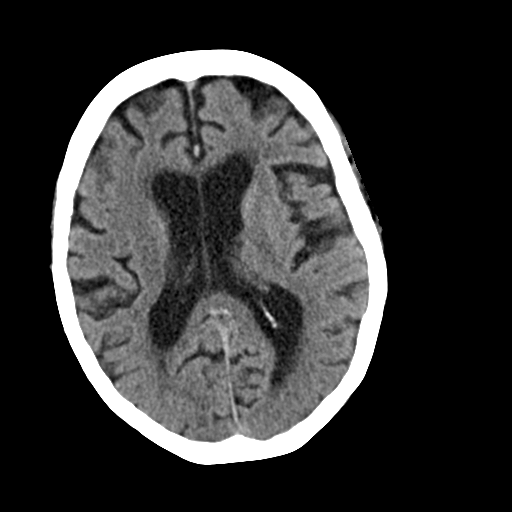
[im 20/36  brain]
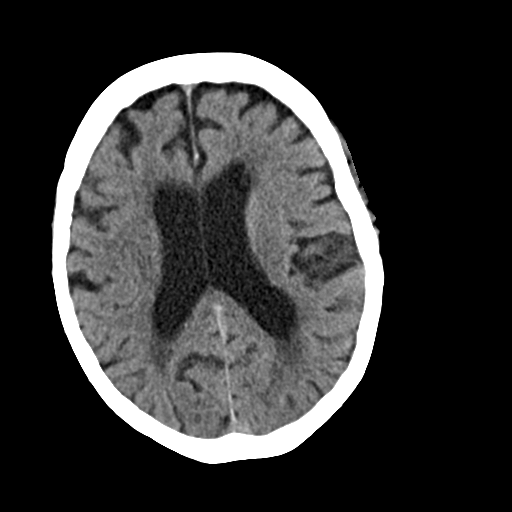
[im 20/36  bone]
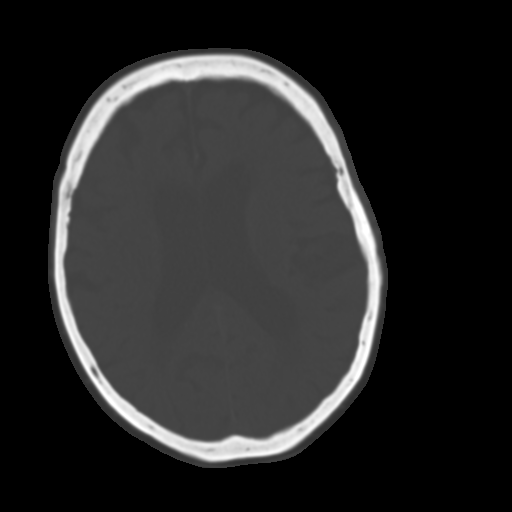
[im 22/36  brain]
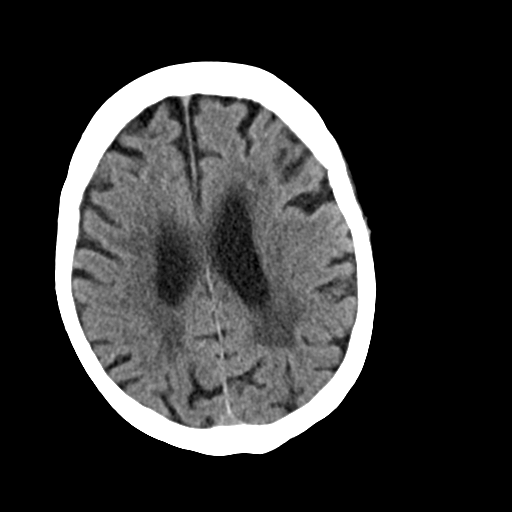
[im 25/36  brain]
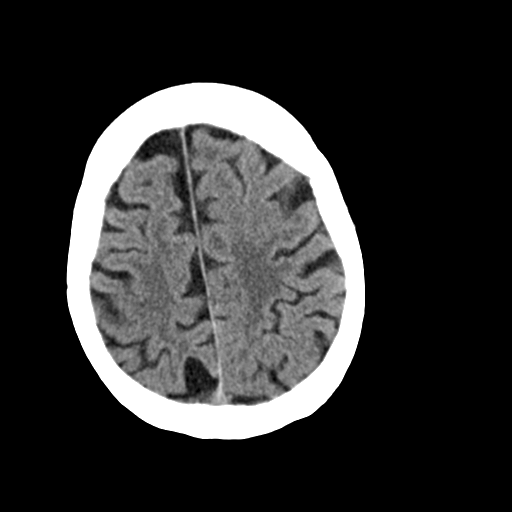
[im 27/36  brain]
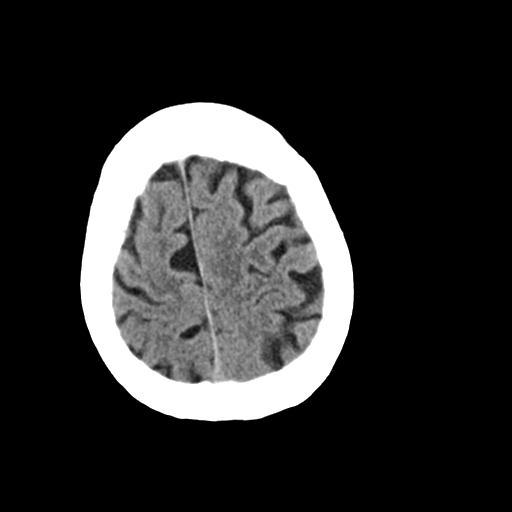
[im 29/36  brain]
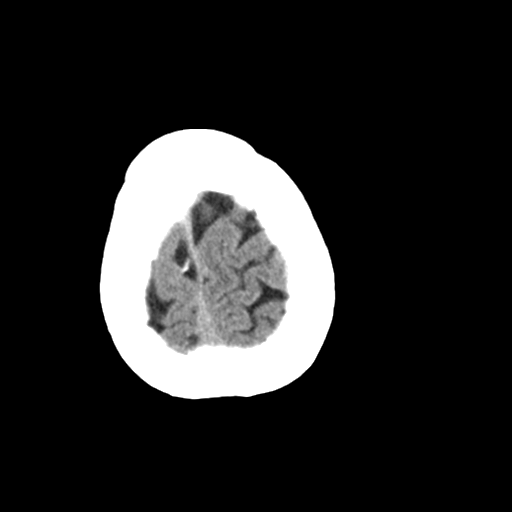
[im 29/36  bone]
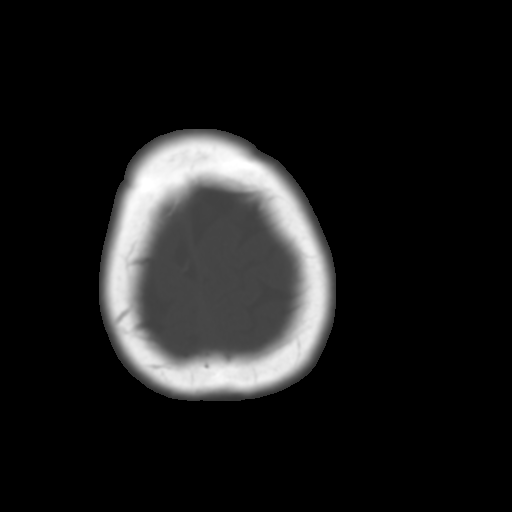
[im 32/36  brain]
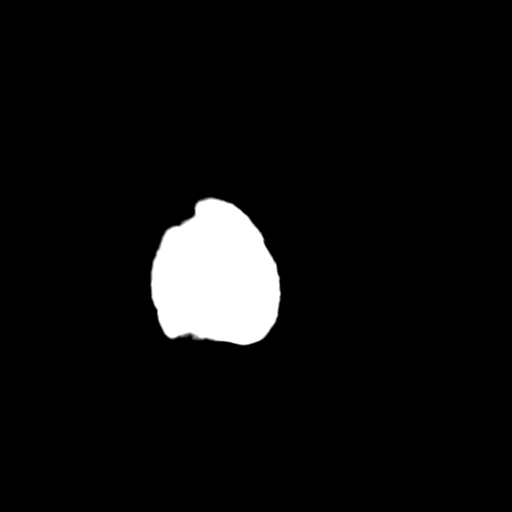
[im 34/36  brain]
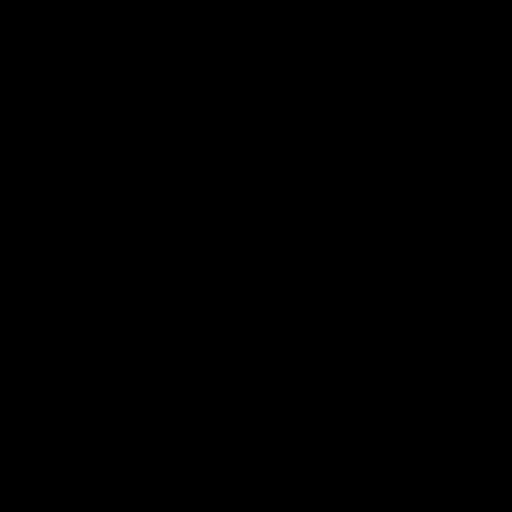

[15 of 30 positions shown; findings below may reference images not displayed]

FINDINGS: Brain:

Mild-to-moderate cerebral and cerebellar atrophy.

Moderate ill-defined hypoattenuation within the cerebral white
matter is nonspecific, but compatible with chronic small vessel
ischemic disease.

There is no acute intracranial hemorrhage.

No demarcated cortical infarct.

No extra-axial fluid collection.

No evidence of intracranial mass.

No midline shift.

Partially empty sella turcica.

Vascular: No hyperdense vessel.  Atherosclerotic calcifications

Skull: Normal. Negative for fracture or focal lesion.

Sinuses/Orbits: Visualized orbits show no acute finding. Small right
frontal sinus mucous retention cyst. Trace ethmoid and maxillary
sinus mucosal thickening bilaterally.
IMPRESSION: No evidence of acute intracranial abnormality.

Mild-to-moderate generalized atrophy of the brain.

Moderate cerebral white matter chronic small vessel ischemic
disease.

Mild paranasal sinus disease as described.

## 2023-02-03 DIAGNOSIS — H35372 Puckering of macula, left eye: Secondary | ICD-10-CM | POA: Diagnosis not present

## 2023-02-03 DIAGNOSIS — H04123 Dry eye syndrome of bilateral lacrimal glands: Secondary | ICD-10-CM | POA: Diagnosis not present

## 2023-02-03 DIAGNOSIS — H524 Presbyopia: Secondary | ICD-10-CM | POA: Diagnosis not present

## 2023-02-03 DIAGNOSIS — H52203 Unspecified astigmatism, bilateral: Secondary | ICD-10-CM | POA: Diagnosis not present

## 2023-02-03 DIAGNOSIS — H26493 Other secondary cataract, bilateral: Secondary | ICD-10-CM | POA: Diagnosis not present

## 2023-02-03 DIAGNOSIS — H43813 Vitreous degeneration, bilateral: Secondary | ICD-10-CM | POA: Diagnosis not present

## 2023-02-09 ENCOUNTER — Encounter (HOSPITAL_BASED_OUTPATIENT_CLINIC_OR_DEPARTMENT_OTHER): Payer: Self-pay | Admitting: Physical Therapy

## 2023-02-09 ENCOUNTER — Ambulatory Visit (HOSPITAL_BASED_OUTPATIENT_CLINIC_OR_DEPARTMENT_OTHER): Payer: Medicare PPO | Attending: Orthopedic Surgery | Admitting: Physical Therapy

## 2023-02-09 DIAGNOSIS — R2681 Unsteadiness on feet: Secondary | ICD-10-CM | POA: Diagnosis not present

## 2023-02-09 DIAGNOSIS — M25552 Pain in left hip: Secondary | ICD-10-CM | POA: Insufficient documentation

## 2023-02-09 DIAGNOSIS — M6281 Muscle weakness (generalized): Secondary | ICD-10-CM | POA: Diagnosis not present

## 2023-02-09 DIAGNOSIS — R262 Difficulty in walking, not elsewhere classified: Secondary | ICD-10-CM | POA: Diagnosis not present

## 2023-02-09 DIAGNOSIS — R2689 Other abnormalities of gait and mobility: Secondary | ICD-10-CM | POA: Insufficient documentation

## 2023-02-09 DIAGNOSIS — M25652 Stiffness of left hip, not elsewhere classified: Secondary | ICD-10-CM | POA: Diagnosis not present

## 2023-02-09 NOTE — Therapy (Signed)
OUTPATIENT PHYSICAL THERAPY LOWER EXTREMITY TREATMENT   Patient Name: Terri Ortega MRN: [id number] DOB:1941-04-18, 82 y.o., female Today's Date: 01/28/2023  END OF SESSION:  PT End of Session - 01/28/23 1628     Visit Number 2    Number of Visits 16    Date for PT Re-Evaluation 03/20/23    PT Start Time 1518    PT Stop Time 1600    PT Time Calculation (min) 42 min    Activity Tolerance Patient tolerated treatment well    Behavior During Therapy [facility] for tasks assessed/performed              Past Medical History:  Diagnosis Date   Graves disease    HTN (hypertension)    Low back pain    Dr. Yetta Barre   Osteoporosis    Pneumonia 2012   Radiation 03/20/14, 03/27/14, 04/05/14, 04/12/14, 04/17/14   bracytherapy to proximal vagina 30 gray   Scoliosis    Uterine cancer (HCC)    surgery and radiation x 5 treatments-last 9'15   Vitamin D deficiency    Past Surgical History:  Procedure Laterality Date   BUNIONECTOMY     left   CATARACT EXTRACTION, BILATERAL Bilateral    last done 11-21-14   COLONOSCOPY WITH PROPOFOL N/A 12/25/2014   Procedure: COLONOSCOPY WITH PROPOFOL;  Surgeon: Charolett Bumpers, MD;  Location: [facility];  Service: Endoscopy;  Laterality: N/A;   FOOT SURGERY Left 2004   Dr Ninetta Lights   JOINT REPLACEMENT     RTKA   ROBOTIC ASSISTED TOTAL HYSTERECTOMY WITH BILATERAL SALPINGO OOPHERECTOMY  01/30/14   with lymph node biopsy   TOTAL HIP ARTHROPLASTY Left 04/17/2022   Procedure: TOTAL HIP ARTHROPLASTY ANTERIOR APPROACH;  Surgeon: Samson Frederic, MD;  Location: [facility];  Service: Orthopedics;  Laterality: Left;   TOTAL KNEE ARTHROPLASTY     right   TOTAL KNEE REVISION Right 01/04/2019   Procedure: TOTAL KNEE REVISION;  Surgeon: Ollen Gross, MD;  Location: [facility];  Service: Orthopedics;  Laterality: Right;  with block   Patient Active Problem List   Diagnosis Date Noted   Nausea 04/27/2022   Blood loss anemia 04/24/2022   Slow transit constipation  04/24/2022   Subcapital fracture of femur, left, closed, initial encounter (HCC) 04/15/2022   Fall at home, initial encounter 04/15/2022   CKD (chronic kidney disease) stage 3, GFR 30-59 ml/min (HCC) 04/15/2022   Hypertensive urgency 04/15/2022   Osteoporosis 04/15/2022   Aortic valve disease 07/10/2021   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   Orthostatic dizziness 11/14/2020   Educated about COVID-19 virus infection 12/21/2019   Mild cognitive impairment 09/06/2019   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   Preop cardiovascular exam 12/20/2018   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   Uterine cancer (HCC) 03/28/2018   Arthralgia of hip or thigh 03/28/2018   Benign essential HTN 03/28/2018   Overweight 03/28/2018   Heart disease 11/09/2014   Leukocytosis 10/31/2014   Diastolic congestive heart failure (HCC) 10/30/2014   Hypothyroidism 10/30/2014   Chest pain at rest 10/30/2014   Chest pain 10/30/2014   Endometrial cancer (HCC) 01/30/2014   Dyspnea 01/11/2014   Edema 01/11/2014   Low back pain with right-sided sciatica 12/13/2013   Degeneration of lumbar or lumbosacral intervertebral disc 12/13/2013   Sciatica 12/13/2013    PCP: Dr Darlen Round   REFERRING PROVIDER: Dr Samson Frederic  REFERRING DIAG:  Diagnosis  M62.81 (ICD-10-CM) - Muscle weakness (generalized)    THERAPY DIAG:  Other abnormalities of gait and mobility  Stiffness of left hip, not elsewhere classified  Muscle weakness (generalized)  Pain in left hip  Rationale for Evaluation and Treatment: Rehabilitation  ONSET DATE: 04/18/2023  SUBJECTIVE:   SUBJECTIVE STATEMENT: The patient reports she is doing OK. She reports she has been doing her exercises a little.  PERTINENT HISTORY: Osteopetrosis, Low back pain, history of THA, graves disease  PAIN:  Are you having pain?  Yes: NPRS scale: 4/10 at the end of the day  Pain location: left lateral hip  Pain description: aching Aggravating factors: as the day goes on Relieving factors: rest   PRECAUTIONS: Fall  WEIGHT BEARING RESTRICTIONS: No   FALLS:  Has patient fallen in last 6 months? No  LIVING ENVIRONMENT: 4 steps to the front door with side rails OCCUPATION:  Retired   Presenter, broadcasting:  Walking   PLOF: Independent  PATIENT GOALS:  The patient would like to return to walking   NEXT MD VISIT: 3-4 months    OBJECTIVE:   DIAGNOSTIC FINDINGS:  X-ray: nothing acute per X ray   PATIENT SURVEYS:  FOTO    COGNITION: Overall cognitive status: Within functional limits for tasks assessed     SENSATION: WFL  EDEMA:  Swelling noted in the left ankle   MUSCLE LENGTH:  POSTURE: No Significant postural limitations  PALPATION: Mild tenderness to palpation in the left hip   LOWER EXTREMITY ROM:  Passive ROM Right eval Left eval  Hip flexion    Hip extension    Hip abduction    Hip adduction    Hip internal rotation    Hip external rotation    Knee flexion    Knee extension    Ankle dorsiflexion    Ankle plantarflexion    Ankle inversion    Ankle eversion     (Blank rows = not tested)  LOWER EXTREMITY MMT:  MMT Right eval Left eval  Hip flexion 14.0 12.0  Hip extension    Hip abduction 17.7 16  Hip adduction    Hip internal rotation    Hip external rotation    Knee flexion    Knee extension 16.7 21.8  Ankle dorsiflexion    Ankle plantarflexion    Ankle inversion    Ankle eversion     (Blank rows = not tested)    GAIT: Decreased single leg stance time on the left; lateral movement away from left LE;   TODAY'S TREATMENT:                                                                                                                              DATE: 7/9 Manual: PROM L hip focus on ER  -Supine marching 1.5# 2x10 -Bridges x20 with band  - Supine clam shell 2x15  red   - LAQ 2x15 4 lbs   -Sit  to stands x10 - Standing march 2x10  - Standing hip extension 2x10  - Standing hip abduction 2x10   - step up lateral 2x10   6/27: -PROM L hip focus on ER -Supine marching 1.5# 2x10 -hooklying adductor squeeze 5s x20 -Supine SLR 2x10 (manual assist with first set) -Sidelying clam 2x10L  -Bridges x20 -LAQ- 4# 5" hold 2x10 L -Standing hip 3 way RTB at ankles- 2x10ea/bil -Standing march x10 (difficult) -Sit to stands x10  HEP update and review   PATIENT EDUCATION:  Education details: reviewed HEP, symptom management  Person educated: Patient Education method: Explanation, Demonstration, Tactile cues, Verbal cues, and Handouts Education comprehension: verbalized understanding, returned demonstration, verbal cues required, tactile cues required, and needs further education  HOME EXERCISE PROGRAM: Access Code: [email or web address] URL: [email or web address] Date: 01/22/2023 Prepared by: Lorayne Bender  Exercises - Supine Quad Set  - 5 x daily - 7 x weekly - 3 sets - 10 reps - Seated Ankle Pumps  - 5 x daily - 7 x weekly - 3 sets - 10 reps - Supine Gluteal Sets  - 5 x daily - 7 x weekly - 3 sets - 10 reps  ASSESSMENT:  CLINICAL IMPRESSION:  The patient tolerated treatment well. We focused on standing exercises. We kept her HEP consistent so she gets in a habit of doing it. We wokred on stair training. Therapy will continue to progress exercises as tolerated.   OBJECTIVE IMPAIRMENTS: Abnormal gait, decreased knowledge of use of DME, decreased mobility, difficulty walking, decreased ROM, decreased strength, and pain.   ACTIVITY LIMITATIONS: bending, sitting, standing, squatting, stairs, and locomotion level  PARTICIPATION LIMITATIONS: meal prep, cleaning, laundry, driving, shopping, community activity, and yard work  PERSONAL FACTORS: Age and 1-2 comorbidities: Low back pain, graves disease  are also affecting patient's functional outcome.    REHAB POTENTIAL: Good  CLINICAL DECISION MAKING: Evolving/moderate complexity  EVALUATION COMPLEXITY: Low declining ability to ambulate    GOALS: Goals reviewed with patient? Yes  SHORT TERM GOALS: Target date: 02/19/2023   Patient will report doing her exercises at least 3-4 times a week Baseline: Goal status: INITIAL  2.  Patient will increase gross left lower extremity strength by 5 pounds Baseline:  Goal status: INITIAL  3.  Patient will increase left hip external rotation by 5 degrees Baseline:  Goal status: INITIAL   LONG TERM GOALS: Target date: 03/19/2023    Patient report no pain at the end of the day after performing activities Baseline:  Goal status: INITIAL  2.  Patient will go up and down 8 steps with a reciprocal gait pattern without increased pain Baseline:  Goal status: INITIAL  3.  Patient will ambulate community distances without increased pain Baseline:  Goal status: INITIAL    PLAN:  PT FREQUENCY: 2x/week  PT DURATION: 8 weeks  PLANNED INTERVENTIONS: Therapeutic exercises, Therapeutic activity, Neuromuscular re-education, Balance training, Gait training, Patient/Family education, Self Care, Joint mobilization, Stair training, DME instructions, Aquatic Therapy, Cryotherapy, Moist heat, Ultrasound, Ionotophoresis 4mg /ml Dexamethasone, and Manual therapy  PLAN FOR NEXT SESSION:   Review HEP, keep home program short but make sure we are loading properly. She was advised to make an exercise routine. Work on balance exercises as well and dynamic gait activity   Referring diagnosis? M62.81 (ICD-10-CM) - Muscle weakness (generalized) Treatment diagnosis? (if different than referring diagnosis)  What was this (referring dx) caused by? [x]  Surgery []  Fall []  Ongoing issue []  Arthritis []  Other: ____________  Laterality: []  Rt [x]  Lt []   Both  Check all possible CPT codes:  *CHOOSE 10 OR LESS*    []  97110 (Therapeutic Exercise)  []   92507 (SLP Treatment)  []  97112 (Neuro Re-ed)   []  16109 (Swallowing Treatment)   []  60454 (Gait Training)   []  [id number] (Cognitive Training, 1st 15 minutes) []  97140 (Manual Therapy)   []  97130 (Cognitive Training, each add'l 15 minutes)  []  97164 (Re-evaluation)                              []  Other, List CPT Code ____________  []  97530 (Therapeutic Activities)     []  97535 (Self Care)   [x]  All codes above (97110 - 97535)  []  97012 (Mechanical Traction)  []  97014 (E-stim Unattended)  []  97032 (E-stim manual)  []  97033 (Ionto)  []  97035 (Ultrasound) []  97750 (Physical Performance Training) []  [id number] (Aquatic Therapy) []  97016 (Vasopneumatic Device) []  [id number] (Paraffin) []  97034 (Contrast Bath) []  97597 (Wound Care 1st 20 sq cm) []  97598 (Wound Care each add'l 20 sq cm) []  97760 (Orthotic Fabrication, Fitting, Training Initial) []  [id number] (Prosthetic Management and Training Initial) []  [id number] (Orthotic or Prosthetic Training/ Modification Subsequent)   Donnel Saxon Hodor, PTA 01/28/2023, 4:41 PM

## 2023-02-11 ENCOUNTER — Ambulatory Visit (HOSPITAL_BASED_OUTPATIENT_CLINIC_OR_DEPARTMENT_OTHER): Payer: Medicare PPO | Admitting: Physical Therapy

## 2023-02-11 ENCOUNTER — Encounter (HOSPITAL_BASED_OUTPATIENT_CLINIC_OR_DEPARTMENT_OTHER): Payer: Self-pay | Admitting: Physical Therapy

## 2023-02-11 DIAGNOSIS — M25652 Stiffness of left hip, not elsewhere classified: Secondary | ICD-10-CM | POA: Diagnosis not present

## 2023-02-11 DIAGNOSIS — R2681 Unsteadiness on feet: Secondary | ICD-10-CM | POA: Diagnosis not present

## 2023-02-11 DIAGNOSIS — R2689 Other abnormalities of gait and mobility: Secondary | ICD-10-CM

## 2023-02-11 DIAGNOSIS — M25552 Pain in left hip: Secondary | ICD-10-CM

## 2023-02-11 DIAGNOSIS — M6281 Muscle weakness (generalized): Secondary | ICD-10-CM | POA: Diagnosis not present

## 2023-02-11 DIAGNOSIS — R262 Difficulty in walking, not elsewhere classified: Secondary | ICD-10-CM | POA: Diagnosis not present

## 2023-02-11 NOTE — Therapy (Signed)
OUTPATIENT PHYSICAL THERAPY LOWER EXTREMITY TREATMENT   Patient Name: Terri Ortega MRN: [id number] DOB:02-11-1941, 82 y.o., female Today's Date: 02/11/2023  END OF SESSION:  PT End of Session - 02/11/23 1458     Visit Number 4    Number of Visits 16    Date for PT Re-Evaluation 03/20/23    PT Start Time 1430    PT Stop Time 1512    PT Time Calculation (min) 42 min    Activity Tolerance Patient tolerated treatment well    Behavior During Therapy [facility] for tasks assessed/performed              Past Medical History:  Diagnosis Date   Graves disease    HTN (hypertension)    Low back pain    Dr. Yetta Barre   Osteoporosis    Pneumonia 2012   Radiation 03/20/14, 03/27/14, 04/05/14, 04/12/14, 04/17/14   bracytherapy to proximal vagina 30 gray   Scoliosis    Uterine cancer (HCC)    surgery and radiation x 5 treatments-last 9'15   Vitamin D deficiency    Past Surgical History:  Procedure Laterality Date   BUNIONECTOMY     left   CATARACT EXTRACTION, BILATERAL Bilateral    last done 11-21-14   COLONOSCOPY WITH PROPOFOL N/A 12/25/2014   Procedure: COLONOSCOPY WITH PROPOFOL;  Surgeon: Charolett Bumpers, MD;  Location: [facility];  Service: Endoscopy;  Laterality: N/A;   FOOT SURGERY Left 2004   Dr Ninetta Lights   JOINT REPLACEMENT     RTKA   ROBOTIC ASSISTED TOTAL HYSTERECTOMY WITH BILATERAL SALPINGO OOPHERECTOMY  01/30/14   with lymph node biopsy   TOTAL HIP ARTHROPLASTY Left 04/17/2022   Procedure: TOTAL HIP ARTHROPLASTY ANTERIOR APPROACH;  Surgeon: Samson Frederic, MD;  Location: [facility];  Service: Orthopedics;  Laterality: Left;   TOTAL KNEE ARTHROPLASTY     right   TOTAL KNEE REVISION Right 01/04/2019   Procedure: TOTAL KNEE REVISION;  Surgeon: Ollen Gross, MD;  Location: [facility];  Service: Orthopedics;  Laterality: Right;  with block   Patient Active Problem List   Diagnosis Date Noted   Nausea 04/27/2022   Blood loss anemia 04/24/2022   Slow transit constipation  04/24/2022   Subcapital fracture of femur, left, closed, initial encounter (HCC) 04/15/2022   Fall at home, initial encounter 04/15/2022   CKD (chronic kidney disease) stage 3, GFR 30-59 ml/min (HCC) 04/15/2022   Hypertensive urgency 04/15/2022   Osteoporosis 04/15/2022   Aortic valve disease 07/10/2021   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   Orthostatic dizziness 11/14/2020   Educated about COVID-19 virus infection 12/21/2019   Mild cognitive impairment 09/06/2019   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   Preop cardiovascular exam 12/20/2018   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   Uterine cancer (HCC) 03/28/2018   Arthralgia of hip or thigh 03/28/2018   Benign essential HTN 03/28/2018   Overweight 03/28/2018   Heart disease 11/09/2014   Leukocytosis 10/31/2014   Diastolic congestive heart failure (HCC) 10/30/2014   Hypothyroidism 10/30/2014   Chest pain at rest 10/30/2014   Chest pain 10/30/2014   Endometrial cancer (HCC) 01/30/2014   Dyspnea 01/11/2014   Edema 01/11/2014   Low back pain with right-sided sciatica 12/13/2013   Degeneration of lumbar or lumbosacral intervertebral disc 12/13/2013   Sciatica 12/13/2013    PCP: Dr Darlen Round   REFERRING PROVIDER: Dr Samson Frederic  REFERRING DIAG:  Diagnosis  M62.81 (ICD-10-CM) - Muscle weakness (generalized)    THERAPY DIAG:  Other abnormalities of gait and mobility  Stiffness of left hip, not elsewhere classified  Muscle weakness (generalized)  Pain in left hip  Rationale for Evaluation and Treatment: Rehabilitation  ONSET DATE: 04/18/2023  SUBJECTIVE:   SUBJECTIVE STATEMENT:  The patient has no complaints. She did well after the last visit.   PERTINENT HISTORY: Osteopetrosis, Low back pain, history of THA, graves disease  PAIN:  Are you having pain? Yes: NPRS scale: 4/10  at the end of the day  Pain location: left lateral hip  Pain description: aching Aggravating factors: as the day goes on Relieving factors: rest   PRECAUTIONS: Fall  WEIGHT BEARING RESTRICTIONS: No   FALLS:  Has patient fallen in last 6 months? No  LIVING ENVIRONMENT: 4 steps to the front door with side rails OCCUPATION:  Retired   Presenter, broadcasting:  Walking   PLOF: Independent  PATIENT GOALS:  The patient would like to return to walking   NEXT MD VISIT: 3-4 months    OBJECTIVE:   DIAGNOSTIC FINDINGS:  X-ray: nothing acute per X ray   PATIENT SURVEYS:  FOTO    COGNITION: Overall cognitive status: Within functional limits for tasks assessed     SENSATION: WFL  EDEMA:  Swelling noted in the left ankle   MUSCLE LENGTH:  POSTURE: No Significant postural limitations  PALPATION: Mild tenderness to palpation in the left hip   LOWER EXTREMITY ROM:  Passive ROM Right eval Left eval  Hip flexion    Hip extension    Hip abduction    Hip adduction    Hip internal rotation    Hip external rotation    Knee flexion    Knee extension    Ankle dorsiflexion    Ankle plantarflexion    Ankle inversion    Ankle eversion     (Blank rows = not tested)  LOWER EXTREMITY MMT:  MMT Right eval Left eval  Hip flexion 14.0 12.0  Hip extension    Hip abduction 17.7 16  Hip adduction    Hip internal rotation    Hip external rotation    Knee flexion    Knee extension 16.7 21.8  Ankle dorsiflexion    Ankle plantarflexion    Ankle inversion    Ankle eversion     (Blank rows = not tested)    GAIT: Decreased single leg stance time on the left; lateral movement away from left LE;   TODAY'S TREATMENT:                                                                                                                              DATE: 7/11 Manual: PROM L hip focus on ER -Supine marching 1.5# 2x10 - Supine clam shell 2x15 red   - LAQ 2x15 4 lbs   Standing hip 3 way  2x10 each way  Standing weight  shift x20  -Sit to stands x10  7/9 Manual: PROM L hip focus on ER  -Supine marching 1.5# 2x10 -Bridges x20 with band  - Supine clam shell 2x15 red   - LAQ 2x15 4 lbs   -Sit to stands x10 - Standing march 2x10  - Standing hip extension 2x10  - Standing hip abduction 2x10   - step up lateral 2x10   6/27: -PROM L hip focus on ER -Supine marching 1.5# 2x10 -hooklying adductor squeeze 5s x20 -Supine SLR 2x10 (manual assist with first set) -Sidelying clam 2x10L  -Bridges x20 -LAQ- 4# 5" hold 2x10 L -Standing hip 3 way RTB at ankles- 2x10ea/bil -Standing march x10 (difficult) -Sit to stands x10  HEP update and review   PATIENT EDUCATION:  Education details: reviewed HEP, symptom management  Person educated: Patient Education method: Explanation, Demonstration, Tactile cues, Verbal cues, and Handouts Education comprehension: verbalized understanding, returned demonstration, verbal cues required, tactile cues required, and needs further education  HOME EXERCISE PROGRAM: Access Code: [email or web address] URL: [email or web address] Date: 01/22/2023 Prepared by: Lorayne Bender  Exercises - Supine Quad Set  - 5 x daily - 7 x weekly - 3 sets - 10 reps - Seated Ankle Pumps  - 5 x daily - 7 x weekly - 3 sets - 10 reps - Supine Gluteal Sets  - 5 x daily - 7 x weekly - 3 sets - 10 reps  ASSESSMENT:  CLINICAL IMPRESSION:  The patient continues to tolerate treatment well. We ill continue to advance as tolerated.   OBJECTIVE IMPAIRMENTS: Abnormal gait, decreased knowledge of use of DME, decreased mobility, difficulty walking, decreased ROM, decreased strength, and pain.   ACTIVITY LIMITATIONS: bending, sitting, standing, squatting, stairs, and locomotion level  PARTICIPATION LIMITATIONS: meal prep, cleaning, laundry, driving, shopping, community activity, and yard work  PERSONAL FACTORS: Age and 1-2 comorbidities: Low back pain, graves  disease  are also affecting patient's functional outcome.   REHAB POTENTIAL: Good  CLINICAL DECISION MAKING: Evolving/moderate complexity  EVALUATION COMPLEXITY: Low declining ability to ambulate    GOALS: Goals reviewed with patient? Yes  SHORT TERM GOALS: Target date: 02/19/2023   Patient will report doing her exercises at least 3-4 times a week Baseline: Goal status: INITIAL  2.  Patient will increase gross left lower extremity strength by 5 pounds Baseline:  Goal status: INITIAL  3.  Patient will increase left hip external rotation by 5 degrees Baseline:  Goal status: INITIAL   LONG TERM GOALS: Target date: 03/19/2023    Patient report no pain at the end of the day after performing activities Baseline:  Goal status: INITIAL  2.  Patient will go up and down 8 steps with a reciprocal gait pattern without increased pain Baseline:  Goal status: INITIAL  3.  Patient will ambulate community distances without increased pain Baseline:  Goal status: INITIAL    PLAN:  PT FREQUENCY: 2x/week  PT DURATION: 8 weeks  PLANNED INTERVENTIONS: Therapeutic exercises, Therapeutic activity, Neuromuscular re-education, Balance training, Gait training, Patient/Family education, Self Care, Joint mobilization, Stair training, DME instructions, Aquatic Therapy, Cryotherapy, Moist heat, Ultrasound, Ionotophoresis 4mg /ml Dexamethasone, and Manual therapy  PLAN FOR NEXT SESSION:   Review HEP, keep home program short but make sure we are loading properly. She was advised to make an exercise routine. Work on balance exercises as well and dynamic gait activity   Referring diagnosis? M62.81 (ICD-10-CM) - Muscle weakness (generalized) Treatment diagnosis? (if different than referring diagnosis)  What was this (referring dx) caused by? [  x] Surgery []  Fall []  Ongoing issue []  Arthritis []  Other: ____________  Laterality: []  Rt [x]  Lt []  Both  Check all possible CPT  codes:  *CHOOSE 10 OR LESS*    []  97110 (Therapeutic Exercise)  []  92507 (SLP Treatment)  []  97112 (Neuro Re-ed)   []  92526 (Swallowing Treatment)   []  97116 (Gait Training)   []  [id number] (Cognitive Training, 1st 15 minutes) []  97140 (Manual Therapy)   []  97130 (Cognitive Training, each add'l 15 minutes)  []  97164 (Re-evaluation)                              []  Other, List CPT Code ____________  []  97530 (Therapeutic Activities)     []  97535 (Self Care)   [x]  All codes above (97110 - 97535)  []  97012 (Mechanical Traction)  []  97014 (E-stim Unattended)  []  97032 (E-stim manual)  []  97033 (Ionto)  []  16109 (Ultrasound) []  97750 (Physical Performance Training) []  [id number] (Aquatic Therapy) []  97016 (Vasopneumatic Device) []  [id number] (Paraffin) []  97034 (Contrast Bath) []  97597 (Wound Care 1st 20 sq cm) []  97598 (Wound Care each add'l 20 sq cm) []  97760 (Orthotic Fabrication, Fitting, Training Initial) []  [id number] (Prosthetic Management and Training Initial) []  [id number] (Orthotic or Prosthetic Training/ Modification Subsequent)   Dessie Coma, PT 02/11/2023, 3:01 PM

## 2023-02-15 ENCOUNTER — Ambulatory Visit (HOSPITAL_BASED_OUTPATIENT_CLINIC_OR_DEPARTMENT_OTHER): Payer: Medicare PPO | Admitting: Physical Therapy

## 2023-02-15 DIAGNOSIS — R2689 Other abnormalities of gait and mobility: Secondary | ICD-10-CM | POA: Diagnosis not present

## 2023-02-15 DIAGNOSIS — M25652 Stiffness of left hip, not elsewhere classified: Secondary | ICD-10-CM

## 2023-02-15 DIAGNOSIS — R262 Difficulty in walking, not elsewhere classified: Secondary | ICD-10-CM | POA: Diagnosis not present

## 2023-02-15 DIAGNOSIS — M25552 Pain in left hip: Secondary | ICD-10-CM

## 2023-02-15 DIAGNOSIS — M6281 Muscle weakness (generalized): Secondary | ICD-10-CM | POA: Diagnosis not present

## 2023-02-15 DIAGNOSIS — R2681 Unsteadiness on feet: Secondary | ICD-10-CM | POA: Diagnosis not present

## 2023-02-15 NOTE — Therapy (Signed)
OUTPATIENT PHYSICAL THERAPY LOWER EXTREMITY TREATMENT   Patient Name: Terri Ortega MRN: [id number] DOB:April 04, 1941, 82 y.o., female Today's Date: 02/15/2023  END OF SESSION:  PT End of Session - 02/15/23 0917     Visit Number 5    Number of Visits 16    Date for PT Re-Evaluation 03/20/23    PT Start Time 0847    PT Stop Time 0930    PT Time Calculation (min) 43 min              Past Medical History:  Diagnosis Date   Graves disease    HTN (hypertension)    Low back pain    Dr. Yetta Barre   Osteoporosis    Pneumonia 2012   Radiation 03/20/14, 03/27/14, 04/05/14, 04/12/14, 04/17/14   bracytherapy to proximal vagina 30 gray   Scoliosis    Uterine cancer (HCC)    surgery and radiation x 5 treatments-last 9'15   Vitamin D deficiency    Past Surgical History:  Procedure Laterality Date   BUNIONECTOMY     left   CATARACT EXTRACTION, BILATERAL Bilateral    last done 11-21-14   COLONOSCOPY WITH PROPOFOL N/A 12/25/2014   Procedure: COLONOSCOPY WITH PROPOFOL;  Surgeon: Charolett Bumpers, MD;  Location: [facility];  Service: Endoscopy;  Laterality: N/A;   FOOT SURGERY Left 2004   Dr Ninetta Lights   JOINT REPLACEMENT     RTKA   ROBOTIC ASSISTED TOTAL HYSTERECTOMY WITH BILATERAL SALPINGO OOPHERECTOMY  01/30/14   with lymph node biopsy   TOTAL HIP ARTHROPLASTY Left 04/17/2022   Procedure: TOTAL HIP ARTHROPLASTY ANTERIOR APPROACH;  Surgeon: Samson Frederic, MD;  Location: [facility];  Service: Orthopedics;  Laterality: Left;   TOTAL KNEE ARTHROPLASTY     right   TOTAL KNEE REVISION Right 01/04/2019   Procedure: TOTAL KNEE REVISION;  Surgeon: Ollen Gross, MD;  Location: [facility];  Service: Orthopedics;  Laterality: Right;  with block   Patient Active Problem List   Diagnosis Date Noted   Nausea 04/27/2022   Blood loss anemia 04/24/2022   Slow transit constipation 04/24/2022   Subcapital fracture of femur, left, closed, initial encounter (HCC) 04/15/2022   Fall at home, initial  encounter 04/15/2022   CKD (chronic kidney disease) stage 3, GFR 30-59 ml/min (HCC) 04/15/2022   Hypertensive urgency 04/15/2022   Osteoporosis 04/15/2022   Aortic valve disease 07/10/2021   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   Orthostatic dizziness 11/14/2020   Educated about COVID-19 virus infection 12/21/2019   Mild cognitive impairment 09/06/2019   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   Preop cardiovascular exam 12/20/2018   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   Uterine cancer (HCC) 03/28/2018   Arthralgia of hip or thigh 03/28/2018   Benign essential HTN 03/28/2018   Overweight 03/28/2018   Heart disease 11/09/2014   Leukocytosis 10/31/2014   Diastolic congestive heart failure (HCC) 10/30/2014   Hypothyroidism 10/30/2014   Chest pain at rest 10/30/2014   Chest pain 10/30/2014   Endometrial cancer (HCC) 01/30/2014   Dyspnea 01/11/2014   Edema 01/11/2014   Low back pain with right-sided sciatica 12/13/2013   Degeneration of lumbar or lumbosacral intervertebral disc 12/13/2013   Sciatica 12/13/2013    PCP: Dr Darlen Round   REFERRING PROVIDER: Dr Samson Frederic   REFERRING DIAG:  Diagnosis  M62.81 (ICD-10-CM) - Muscle weakness (generalized)    THERAPY DIAG:  No  diagnosis found.  Rationale for Evaluation and Treatment: Rehabilitation  ONSET DATE: 04/18/2023  SUBJECTIVE:   SUBJECTIVE STATEMENT: The patient continues to have no complaints. She reports she is doing OK.   PERTINENT HISTORY: Osteopetrosis, Low back pain, history of THA, graves disease  PAIN:  Are you having pain? Yes: NPRS scale: 4/10 at the end of the day  Pain location: left lateral hip  Pain description: aching Aggravating factors: as the day goes on Relieving factors: rest   PRECAUTIONS: Fall  WEIGHT BEARING RESTRICTIONS: No   FALLS:  Has patient fallen  in last 6 months? No  LIVING ENVIRONMENT: 4 steps to the front door with side rails OCCUPATION:  Retired   Presenter, broadcasting:  Walking   PLOF: Independent  PATIENT GOALS:  The patient would like to return to walking   NEXT MD VISIT: 3-4 months    OBJECTIVE:   DIAGNOSTIC FINDINGS:  X-ray: nothing acute per X ray   PATIENT SURVEYS:  FOTO    COGNITION: Overall cognitive status: Within functional limits for tasks assessed     SENSATION: WFL  EDEMA:  Swelling noted in the left ankle   MUSCLE LENGTH:  POSTURE: No Significant postural limitations  PALPATION: Mild tenderness to palpation in the left hip   LOWER EXTREMITY ROM:  Passive ROM Right eval Left eval  Hip flexion    Hip extension    Hip abduction    Hip adduction    Hip internal rotation    Hip external rotation    Knee flexion    Knee extension    Ankle dorsiflexion    Ankle plantarflexion    Ankle inversion    Ankle eversion     (Blank rows = not tested)  LOWER EXTREMITY MMT:  MMT Right eval Left eval Right  7/15 Left 7/15  Hip flexion 14.0 12.0 24.0 16.2  Hip extension      Hip abduction 17.7 16 20.9 18.6  Hip adduction      Hip internal rotation      Hip external rotation      Knee flexion      Knee extension 16.7 21.8 17.7 26.7  Ankle dorsiflexion      Ankle plantarflexion      Ankle inversion      Ankle eversion       (Blank rows = not tested)    GAIT: Decreased single leg stance time on the left; lateral movement away from left LE;   TODAY'S TREATMENT:                                                                                                                              DATE: -Supine marching 1.5# 2x10 - Supine clam shell 2x15 green   - LTR x20 with cuing to slow down and get a stretch on the back end   SAQ- 2 lbs 2x15   Step up 2x10 4 inch  Lateral 2x10 4 inch  7/11 Manual: PROM L hip focus on ER -Supine marching 1.5# 2x10 - Supine clam shell 2x15  red   - LAQ 2x15 4 lbs   Standing hip 3 way 2x10 each way  Standing weight shift x20  -Sit to stands x10  7/9 Manual: PROM L hip focus on ER  -Supine marching 1.5# 2x10 -Bridges x20 with band  - Supine clam shell 2x15 red   - LAQ 2x15 4 lbs   -Sit to stands x10 - Standing march 2x10  - Standing hip extension 2x10  - Standing hip abduction 2x10   - step up lateral 2x10   6/27: -PROM L hip focus on ER -Supine marching 1.5# 2x10 -hooklying adductor squeeze 5s x20 -Supine SLR 2x10 (manual assist with first set) -Sidelying clam 2x10L  -Bridges x20 -LAQ- 4# 5" hold 2x10 L -Standing hip 3 way RTB at ankles- 2x10ea/bil -Standing march x10 (difficult) -Sit to stands x10  HEP update and review   PATIENT EDUCATION:  Education details: reviewed HEP, symptom management  Person educated: Patient Education method: Explanation, Demonstration, Tactile cues, Verbal cues, and Handouts Education comprehension: verbalized understanding, returned demonstration, verbal cues required, tactile cues required, and needs further education  HOME EXERCISE PROGRAM: Access Code: [email or web address] URL: [email or web address] Date: 01/22/2023 Prepared by: Lorayne Bender  Exercises - Supine Quad Set  - 5 x daily - 7 x weekly - 3 sets - 10 reps - Seated Ankle Pumps  - 5 x daily - 7 x weekly - 3 sets - 10 reps - Supine Gluteal Sets  - 5 x daily - 7 x weekly - 3 sets - 10 reps  ASSESSMENT:  CLINICAL IMPRESSION: Therapy tested patients strength today> She has improved with all movements. We continue to emphasize the importance of performing her home program. We reviewed LTR this visit and how to slow it down and really use it for stretching. She continues to have an antalgic gait. We will progress as tolerated. We will continue to progress stair training as well.   OBJECTIVE IMPAIRMENTS: Abnormal gait, decreased knowledge of use of DME, decreased mobility, difficulty walking, decreased ROM,  decreased strength, and pain.   ACTIVITY LIMITATIONS: bending, sitting, standing, squatting, stairs, and locomotion level  PARTICIPATION LIMITATIONS: meal prep, cleaning, laundry, driving, shopping, community activity, and yard work  PERSONAL FACTORS: Age and 1-2 comorbidities: Low back pain, graves disease  are also affecting patient's functional outcome.   REHAB POTENTIAL: Good  CLINICAL DECISION MAKING: Evolving/moderate complexity  EVALUATION COMPLEXITY: Low declining ability to ambulate    GOALS: Goals reviewed with patient? Yes  SHORT TERM GOALS: Target date: 02/19/2023   Patient will report doing her exercises at least 3-4 times a week Baseline: Goal status: INITIAL  2.  Patient will increase gross left lower extremity strength by 5 pounds Baseline:  Goal status: INITIAL  3.  Patient will increase left hip external rotation by 5 degrees Baseline:  Goal status: INITIAL   LONG TERM GOALS: Target date: 03/19/2023    Patient report no pain at the end of the day after performing activities Baseline:  Goal status: INITIAL  2.  Patient will go up and down 8 steps with a reciprocal gait pattern without increased pain Baseline:  Goal status: INITIAL  3.  Patient will ambulate community distances without increased pain Baseline:  Goal status: INITIAL    PLAN:  PT FREQUENCY: 2x/week  PT DURATION: 8 weeks  PLANNED INTERVENTIONS: Therapeutic exercises, Therapeutic activity, Neuromuscular re-education, Balance training, Gait training, Patient/Family education,  Self Care, Joint mobilization, Stair training, DME instructions, Aquatic Therapy, Cryotherapy, Moist heat, Ultrasound, Ionotophoresis 4mg /ml Dexamethasone, and Manual therapy  PLAN FOR NEXT SESSION:   Review HEP, keep home program short but make sure we are loading properly. She was advised to make an exercise routine. Work on balance exercises as well and dynamic gait activity   Referring diagnosis?  M62.81 (ICD-10-CM) - Muscle weakness (generalized) Treatment diagnosis? (if different than referring diagnosis)  What was this (referring dx) caused by? [x]  Surgery []  Fall []  Ongoing issue []  Arthritis []  Other: ____________  Laterality: []  Rt [x]  Lt []  Both  Check all possible CPT codes:  *CHOOSE 10 OR LESS*    []  97110 (Therapeutic Exercise)  []  92507 (SLP Treatment)  []  97112 (Neuro Re-ed)   []  92526 (Swallowing Treatment)   []  97116 (Gait Training)   []  [id number] (Cognitive Training, 1st 15 minutes) []  97140 (Manual Therapy)   []  97130 (Cognitive Training, each add'l 15 minutes)  []  97164 (Re-evaluation)                              []  Other, List CPT Code ____________  []  40347 (Therapeutic Activities)     []  97535 (Self Care)   [x]  All codes above (97110 - 97535)  []  97012 (Mechanical Traction)  []  97014 (E-stim Unattended)  []  97032 (E-stim manual)  []  97033 (Ionto)  []  97035 (Ultrasound) []  97750 (Physical Performance Training) []  [id number] (Aquatic Therapy) []  97016 (Vasopneumatic Device) []  [id number] (Paraffin) []  97034 (Contrast Bath) []  97597 (Wound Care 1st 20 sq cm) []  97598 (Wound Care each add'l 20 sq cm) []  97760 (Orthotic Fabrication, Fitting, Training Initial) []  [id number] (Prosthetic Management and Training Initial) []  [id number] (Orthotic or Prosthetic Training/ Modification Subsequent)   Dessie Coma, PT 02/15/2023, 9:26 AM

## 2023-02-18 ENCOUNTER — Encounter (HOSPITAL_BASED_OUTPATIENT_CLINIC_OR_DEPARTMENT_OTHER): Payer: Self-pay | Admitting: Physical Therapy

## 2023-02-18 ENCOUNTER — Ambulatory Visit (HOSPITAL_BASED_OUTPATIENT_CLINIC_OR_DEPARTMENT_OTHER): Payer: Medicare PPO | Admitting: Physical Therapy

## 2023-02-18 DIAGNOSIS — R262 Difficulty in walking, not elsewhere classified: Secondary | ICD-10-CM

## 2023-02-18 DIAGNOSIS — R2681 Unsteadiness on feet: Secondary | ICD-10-CM

## 2023-02-18 DIAGNOSIS — M25652 Stiffness of left hip, not elsewhere classified: Secondary | ICD-10-CM

## 2023-02-18 DIAGNOSIS — R2689 Other abnormalities of gait and mobility: Secondary | ICD-10-CM

## 2023-02-18 DIAGNOSIS — M25552 Pain in left hip: Secondary | ICD-10-CM

## 2023-02-18 DIAGNOSIS — M6281 Muscle weakness (generalized): Secondary | ICD-10-CM

## 2023-02-18 NOTE — Therapy (Signed)
OUTPATIENT PHYSICAL THERAPY LOWER EXTREMITY TREATMENT   Patient Name: Terri Ortega MRN: [id number] DOB:07/30/1941, 82 y.o., female Today's Date: 02/18/2023  END OF SESSION:  PT End of Session - 02/18/23 1357     Visit Number 6    Number of Visits 16    Date for PT Re-Evaluation 03/20/23    PT Start Time 1345    PT Stop Time 1427    PT Time Calculation (min) 42 min    Activity Tolerance Patient tolerated treatment well    Behavior During Therapy [facility] for tasks assessed/performed              Past Medical History:  Diagnosis Date   Graves disease    HTN (hypertension)    Low back pain    Dr. Yetta Barre   Osteoporosis    Pneumonia 2012   Radiation 03/20/14, 03/27/14, 04/05/14, 04/12/14, 04/17/14   bracytherapy to proximal vagina 30 gray   Scoliosis    Uterine cancer (HCC)    surgery and radiation x 5 treatments-last 9'15   Vitamin D deficiency    Past Surgical History:  Procedure Laterality Date   BUNIONECTOMY     left   CATARACT EXTRACTION, BILATERAL Bilateral    last done 11-21-14   COLONOSCOPY WITH PROPOFOL N/A 12/25/2014   Procedure: COLONOSCOPY WITH PROPOFOL;  Surgeon: Charolett Bumpers, MD;  Location: [facility];  Service: Endoscopy;  Laterality: N/A;   FOOT SURGERY Left 2004   Dr Ninetta Lights   JOINT REPLACEMENT     RTKA   ROBOTIC ASSISTED TOTAL HYSTERECTOMY WITH BILATERAL SALPINGO OOPHERECTOMY  01/30/14   with lymph node biopsy   TOTAL HIP ARTHROPLASTY Left 04/17/2022   Procedure: TOTAL HIP ARTHROPLASTY ANTERIOR APPROACH;  Surgeon: Samson Frederic, MD;  Location: [facility];  Service: Orthopedics;  Laterality: Left;   TOTAL KNEE ARTHROPLASTY     right   TOTAL KNEE REVISION Right 01/04/2019   Procedure: TOTAL KNEE REVISION;  Surgeon: Ollen Gross, MD;  Location: [facility];  Service: Orthopedics;  Laterality: Right;  with block   Patient Active Problem List   Diagnosis Date Noted   Nausea 04/27/2022   Blood loss anemia 04/24/2022   Slow transit constipation  04/24/2022   Subcapital fracture of femur, left, closed, initial encounter (HCC) 04/15/2022   Fall at home, initial encounter 04/15/2022   CKD (chronic kidney disease) stage 3, GFR 30-59 ml/min (HCC) 04/15/2022   Hypertensive urgency 04/15/2022   Osteoporosis 04/15/2022   Aortic valve disease 07/10/2021   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   Orthostatic dizziness 11/14/2020   Educated about COVID-19 virus infection 12/21/2019   Mild cognitive impairment 09/06/2019   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   Preop cardiovascular exam 12/20/2018   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   Uterine cancer (HCC) 03/28/2018   Arthralgia of hip or thigh 03/28/2018   Benign essential HTN 03/28/2018   Overweight 03/28/2018   Heart disease 11/09/2014   Leukocytosis 10/31/2014   Diastolic congestive heart failure (HCC) 10/30/2014   Hypothyroidism 10/30/2014   Chest pain at rest 10/30/2014   Chest pain 10/30/2014   Endometrial cancer (HCC) 01/30/2014   Dyspnea 01/11/2014   Edema 01/11/2014   Low back pain with right-sided sciatica 12/13/2013   Degeneration of lumbar or lumbosacral intervertebral disc 12/13/2013   Sciatica 12/13/2013    PCP: Dr Darlen Round   REFERRING PROVIDER: Dr Samson Frederic  REFERRING DIAG:  Diagnosis  M62.81 (ICD-10-CM) - Muscle weakness (generalized)    THERAPY DIAG:  Other abnormalities of gait and mobility  Stiffness of left hip, not elsewhere classified  Muscle weakness (generalized)  Pain in left hip  Unsteadiness on feet  Difficulty walking  Rationale for Evaluation and Treatment: Rehabilitation  ONSET DATE: 04/18/2023  SUBJECTIVE:   SUBJECTIVE STATEMENT: The patient continues to have no complaints. She reports she is doing OK.   PERTINENT HISTORY: Osteopetrosis, Low back pain, history of THA, graves disease   PAIN:  Are you having pain? Yes: NPRS scale: 4/10 at the end of the day  Pain location: left lateral hip  Pain description: aching Aggravating factors: as the day goes on Relieving factors: rest   PRECAUTIONS: Fall  WEIGHT BEARING RESTRICTIONS: No   FALLS:  Has patient fallen in last 6 months? No  LIVING ENVIRONMENT: 4 steps to the front door with side rails OCCUPATION:  Retired   Presenter, broadcasting:  Walking   PLOF: Independent  PATIENT GOALS:  The patient would like to return to walking   NEXT MD VISIT: 3-4 months    OBJECTIVE:   DIAGNOSTIC FINDINGS:  X-ray: nothing acute per X ray   PATIENT SURVEYS:  FOTO    COGNITION: Overall cognitive status: Within functional limits for tasks assessed     SENSATION: WFL  EDEMA:  Swelling noted in the left ankle   MUSCLE LENGTH:  POSTURE: No Significant postural limitations  PALPATION: Mild tenderness to palpation in the left hip   LOWER EXTREMITY ROM:  Passive ROM Right eval Left eval  Hip flexion    Hip extension    Hip abduction    Hip adduction    Hip internal rotation    Hip external rotation    Knee flexion    Knee extension    Ankle dorsiflexion    Ankle plantarflexion    Ankle inversion    Ankle eversion     (Blank rows = not tested)  LOWER EXTREMITY MMT:  MMT Right eval Left eval Right  7/15 Left 7/15  Hip flexion 14.0 12.0 24.0 16.2  Hip extension      Hip abduction 17.7 16 20.9 18.6  Hip adduction      Hip internal rotation      Hip external rotation      Knee flexion      Knee extension 16.7 21.8 17.7 26.7  Ankle dorsiflexion      Ankle plantarflexion      Ankle inversion      Ankle eversion       (Blank rows = not tested)    GAIT: Decreased single leg stance time on the left; lateral movement away from left LE;   TODAY'S TREATMENT:                                                                                                                              DATE: 7/18 Nu-step:  L4 6 min   Sit to stand 3x5  Seated clamshell 3x15 Green   SAQ- 2 lbs 2x15   Step up 2x10 4 inch  Lateral 2x10 4 inch   LAQ green 2x10   Partial squats 2x10     7/15 -Supine marching 1.5# 2x10 - Supine clam shell 2x15 green   - LTR x20 with cuing to slow down and get a stretch on the back end   SAQ- 2 lbs 2x15   Step up 2x10 4 inch  Lateral 2x10 4 inch          7/11 Manual: PROM L hip focus on ER -Supine marching 1.5# 2x10 - Supine clam shell 2x15 red   - LAQ 2x15 4 lbs   Standing hip 3 way 2x10 each way  Standing weight shift x20  -Sit to stands x10    PATIENT EDUCATION:  Education details: reviewed HEP, symptom management  Person educated: Patient Education method: Explanation, Demonstration, Tactile cues, Verbal cues, and Handouts Education comprehension: verbalized understanding, returned demonstration, verbal cues required, tactile cues required, and needs further education  HOME EXERCISE PROGRAM: Access Code: [email or web address] URL: [email or web address] Date: 01/22/2023 Prepared by: Lorayne Bender  Exercises - Supine Quad Set  - 5 x daily - 7 x weekly - 3 sets - 10 reps - Seated Ankle Pumps  - 5 x daily - 7 x weekly - 3 sets - 10 reps - Supine Gluteal Sets  - 5 x daily - 7 x weekly - 3 sets - 10 reps  ASSESSMENT:  CLINICAL IMPRESSION: The patient continues to make progress. Her FOTO score hasn't improved much but it has improved. She was able to sit to stands today without hands. She also was able to perform squats without much pain. She was encouraged to continue with her exercises at home. See goals specific progress below.  OBJECTIVE IMPAIRMENTS: Abnormal gait, decreased knowledge of use of DME, decreased mobility, difficulty walking, decreased ROM, decreased strength, and pain.   ACTIVITY LIMITATIONS: bending, sitting, standing, squatting, stairs, and locomotion level  PARTICIPATION LIMITATIONS: meal prep, cleaning, laundry,  driving, shopping, community activity, and yard work  PERSONAL FACTORS: Age and 1-2 comorbidities: Low back pain, graves disease  are also affecting patient's functional outcome.   REHAB POTENTIAL: Good  CLINICAL DECISION MAKING: Evolving/moderate complexity  EVALUATION COMPLEXITY: Low declining ability to ambulate    GOALS: Goals reviewed with patient? Yes  SHORT TERM GOALS: Target date:. 03/18/2023      Patient will report doing her exercises at least 3-4 times a week Baseline: Goal status: INITIAL 7/18 doing 1-2   2.  Patient will increase gross left lower extremity strength by 5 pounds Baseline:  Goal status: INITIAL improving but stil limited in some planes 7/18  3.  Patient will increase left hip external rotation by 5 degrees Baseline:  Goal status: INITIAL acieved 7/18    LONG TERM GOALS: Target date: 03/19/2023    Patient report no pain at the end of the day after performing activities Baseline:  Goal status: INITIAL  2.  Patient will go up and down 8 steps with a reciprocal gait pattern without increased pain Baseline:  Goal status: INITIAL  3.  Patient will ambulate community distances without increased pain Baseline:  Goal status: INITIAL    PLAN:  PT FREQUENCY: 2x/week  PT DURATION: 8 weeks  PLANNED INTERVENTIONS: Therapeutic exercises, Therapeutic activity, Neuromuscular re-education, Balance training, Gait training, Patient/Family education, Self Care, Joint mobilization, Stair training, DME instructions, Aquatic Therapy,  Cryotherapy, Moist heat, Ultrasound, Ionotophoresis 4mg /ml Dexamethasone, and Manual therapy  PLAN FOR NEXT SESSION:   Review HEP, keep home program short but make sure we are loading properly. She was advised to make an exercise routine. Work on balance exercises as well and dynamic gait activity   Referring diagnosis? M62.81 (ICD-10-CM) - Muscle weakness (generalized) Treatment diagnosis? (if different than referring  diagnosis)  What was this (referring dx) caused by? [x]  Surgery []  Fall []  Ongoing issue []  Arthritis []  Other: ____________  Laterality: []  Rt [x]  Lt []  Both  Check all possible CPT codes:  *CHOOSE 10 OR LESS*    []  97110 (Therapeutic Exercise)  []  92507 (SLP Treatment)  []  97112 (Neuro Re-ed)   []  92526 (Swallowing Treatment)   []  97116 (Gait Training)   []  [id number] (Cognitive Training, 1st 15 minutes) []  97140 (Manual Therapy)   []  97130 (Cognitive Training, each add'l 15 minutes)  []  97164 (Re-evaluation)                              []  Other, List CPT Code ____________  []  97530 (Therapeutic Activities)     []  16109 (Self Care)   [x]  All codes above (97110 - 97535)  []  97012 (Mechanical Traction)  []  97014 (E-stim Unattended)  []  97032 (E-stim manual)  []  97033 (Ionto)  []  97035 (Ultrasound) []  97750 (Physical Performance Training) []  [id number] (Aquatic Therapy) []  97016 (Vasopneumatic Device) []  [id number] (Paraffin) []  97034 (Contrast Bath) []  97597 (Wound Care 1st 20 sq cm) []  97598 (Wound Care each add'l 20 sq cm) []  97760 (Orthotic Fabrication, Fitting, Training Initial) []  [id number] (Prosthetic Management and Training Initial) []  [id number] (Orthotic or Prosthetic Training/ Modification Subsequent)   Dessie Coma, PT 02/18/2023, 1:59 PM

## 2023-02-19 ENCOUNTER — Encounter (HOSPITAL_BASED_OUTPATIENT_CLINIC_OR_DEPARTMENT_OTHER): Payer: Self-pay | Admitting: Physical Therapy

## 2023-02-20 NOTE — Progress Notes (Unsigned)
Cardiology Clinic Note   Date: 02/22/2023 ID: Terri, Ortega 07-07-1941, MRN [id number]  Primary Cardiologist:  Rollene Rotunda, MD  Patient Profile    Terri Ortega is a 82 y.o. female who presents to the clinic today for evaluation of lower extremity edema.     Past medical history significant for: Chronic diastolic heart failure. Echo 10/24/2015: EF 55 to 60%.  Grade I DD.  Mild AI.  Mildly elevated PA pressure. Aortic valve insufficiency. Hypertension. CKD stage III. Uterine/endometrial cancer.     History of Present Illness    Terri Ortega was first evaluated by Dr. Antoine Poche on 01/11/2014 for dyspnea and lower extremity edema.  Echo prior to visit showed normal LV function with mild diastolic dysfunction and mild aortic insufficiency.  No further workup was indicated and patient was continued on Lasix.  Patient was last seen in the office by Dr. Antoine Poche on 07/21/2022 for routine follow-up.  She reported increased lower extremity edema felt to be venous insufficiency.  She was given a short course of diuretics and BNP was only mildly elevated.  Today, patient is accompanied by her husband. She reports a 2 month history of increased lower extremity edema. She reports edema is at its best upon awakening and increases throughout the day. She does not wear compression socks in the summer and does not typically elevate her legs. She admits to dietary indiscretion of sodium with eating meals out frequently. She does not weigh daily. Her weight is up 11 lb from March 2024. Patient and her husband feel this has been of gradual onset. Patient has prn Lasix. Last dose sometime last week with questionable improvement.  Patient works with PT twice a week and will do home exercises on occasion. Patient denies shortness of breath or dyspnea on exertion. No chest pain, pressure, or tightness. Denies orthopnea or PND. No palpitations.       ROS: All other systems reviewed  and are otherwise negative except as noted in History of Present Illness.  Studies Reviewed       EKG is not ordered today.      Physical Exam    VS:  BP 138/78 (BP Location: Left Arm, Patient Position: Sitting, Cuff Size: Normal)   Pulse (!) 56   Ht 5\' 4"  (1.626 m)   Wt 184 lb (83.5 kg)   SpO2 94%   BMI 31.58 kg/m  , BMI Body mass index is 31.58 kg/m.  GEN: Well nourished, well developed, in no acute distress. Neck: No JVD or carotid bruits. Cardiac:  RRR. No murmurs. No rubs or gallops.   Respiratory:  Respirations regular and unlabored. Clear to auscultation without rales, wheezing or rhonchi. GI: Soft, nontender, nondistended. Extremities: Radials/DP/PT 2+ and equal bilaterally. No clubbing or cyanosis. 1+ pitting edema bilateral lower extremities.   Skin: Warm and dry, no rash. Neuro: Strength intact.  Assessment & Plan    Lower extremity edema. Patient reports a 2 month history of increased lower extremity edema. Edema is at its best in the morning and worsens throughout the day. Patient does not wear compression socks in the summer and does not elevate her legs. She admits to sodium indiscretion with eating out for many of her meals. She does not weigh daily. Last dose of prn Lasix 1 week ago with questionable improvement of edema. 1+ pitting edema bilateral lower extremities on exam today otherwise euvolemic and well compensated. Patient is instructed to take Lasix x 4 days the resume prn.  Return next week for repeat BMP.  Patient will weigh daily and take a dose of Lasix for weight gain of 3 lb overnight and 5 lb in a week.  Hypertension: BP today 138/78. Patient denies headaches, dizziness or vision changes. Continue amlodipine.  Disposition: Lasix 20 mg x 4 days then resume prn. BMP next week. Return for previously scheduled visit with Dr. Antoine Poche in December or sooner as needed.          Signed, Etta Grandchild. Hallee Mckenny, DNP, NP-C

## 2023-02-22 ENCOUNTER — Ambulatory Visit: Payer: Medicare PPO | Attending: Student | Admitting: Student

## 2023-02-22 ENCOUNTER — Encounter: Payer: Self-pay | Admitting: Student

## 2023-02-22 VITALS — BP 138/78 | HR 56 | Ht 64.0 in | Wt 184.0 lb

## 2023-02-22 DIAGNOSIS — Z79899 Other long term (current) drug therapy: Secondary | ICD-10-CM | POA: Diagnosis not present

## 2023-02-22 DIAGNOSIS — I10 Essential (primary) hypertension: Secondary | ICD-10-CM

## 2023-02-22 DIAGNOSIS — R600 Localized edema: Secondary | ICD-10-CM

## 2023-02-22 NOTE — Patient Instructions (Signed)
Medication Instructions:  TAKE LASIX 20MG  FOR THE NEXT FOUR (4) DAYS *If you need a refill on your cardiac medications before your next appointment, please call your pharmacy*   Lab Work: RETURN FOR LABS IN 1 WEEK If you have labs (blood work) drawn today and your tests are completely normal, you will receive your results only by: MyChart Message (if you have MyChart) OR A paper copy in the mail If you have any lab test that is abnormal or we need to change your treatment, we will call you to review the results.   Testing/Procedures: NONE   Follow-Up: At [facility], you and your health needs are our priority.  As part of our continuing mission to provide you with exceptional heart care, we have created designated Provider Care Teams.  These Care Teams include your primary Cardiologist (physician) and Advanced Practice Providers (APPs -  Physician Assistants and Nurse Practitioners) who all work together to provide you with the care you need, when you need it.  We recommend signing up for the patient portal called "MyChart".  Sign up information is provided on this After Visit Summary.  MyChart is used to connect with patients for Virtual Visits (Telemedicine).  Patients are able to view lab/test results, encounter notes, upcoming appointments, etc.  Non-urgent messages can be sent to your provider as well.   To learn more about what you can do with MyChart, go to [email or web address].    Your next appointment:    KEEP APPOINTMENT WITH DR. HOCHREIN 07/22/2023 11AM  Provider:   Rollene Rotunda, MD     Other Instructions WEIGH DAILY. IF FLUID WEIGHT GAIN IS 3 POUNDS IN ONE DAY OR 5 POUNDS IN ONE WEEK TAKE LASIX 20MG .

## 2023-02-23 ENCOUNTER — Ambulatory Visit (HOSPITAL_BASED_OUTPATIENT_CLINIC_OR_DEPARTMENT_OTHER): Payer: Medicare PPO | Admitting: Physical Therapy

## 2023-02-23 ENCOUNTER — Encounter (HOSPITAL_BASED_OUTPATIENT_CLINIC_OR_DEPARTMENT_OTHER): Payer: Self-pay | Admitting: Physical Therapy

## 2023-02-23 DIAGNOSIS — R262 Difficulty in walking, not elsewhere classified: Secondary | ICD-10-CM | POA: Diagnosis not present

## 2023-02-23 DIAGNOSIS — M6281 Muscle weakness (generalized): Secondary | ICD-10-CM | POA: Diagnosis not present

## 2023-02-23 DIAGNOSIS — M25552 Pain in left hip: Secondary | ICD-10-CM | POA: Diagnosis not present

## 2023-02-23 DIAGNOSIS — R2681 Unsteadiness on feet: Secondary | ICD-10-CM | POA: Diagnosis not present

## 2023-02-23 DIAGNOSIS — R2689 Other abnormalities of gait and mobility: Secondary | ICD-10-CM

## 2023-02-23 DIAGNOSIS — M25652 Stiffness of left hip, not elsewhere classified: Secondary | ICD-10-CM

## 2023-02-23 NOTE — Therapy (Signed)
OUTPATIENT PHYSICAL THERAPY LOWER EXTREMITY TREATMENT   Patient Name: Terri Ortega MRN: [id number] DOB:1941/06/24, 82 y.o., female Today's Date: 02/24/2023  END OF SESSION:  PT End of Session - 02/23/23 1419     Visit Number 7    Number of Visits 16    Date for PT Re-Evaluation 03/20/23    PT Start Time 1015    PT Stop Time 1058    PT Time Calculation (min) 43 min    Activity Tolerance Patient tolerated treatment well    Behavior During Therapy [facility] for tasks assessed/performed               Past Medical History:  Diagnosis Date   Graves disease    HTN (hypertension)    Low back pain    Dr. Yetta Barre   Osteoporosis    Pneumonia 2012   Radiation 03/20/14, 03/27/14, 04/05/14, 04/12/14, 04/17/14   bracytherapy to proximal vagina 30 gray   Scoliosis    Uterine cancer (HCC)    surgery and radiation x 5 treatments-last 9'15   Vitamin D deficiency    Past Surgical History:  Procedure Laterality Date   BUNIONECTOMY     left   CATARACT EXTRACTION, BILATERAL Bilateral    last done 11-21-14   COLONOSCOPY WITH PROPOFOL N/A 12/25/2014   Procedure: COLONOSCOPY WITH PROPOFOL;  Surgeon: Charolett Bumpers, MD;  Location: [facility];  Service: Endoscopy;  Laterality: N/A;   FOOT SURGERY Left 2004   Dr Ninetta Lights   JOINT REPLACEMENT     RTKA   ROBOTIC ASSISTED TOTAL HYSTERECTOMY WITH BILATERAL SALPINGO OOPHERECTOMY  01/30/14   with lymph node biopsy   TOTAL HIP ARTHROPLASTY Left 04/17/2022   Procedure: TOTAL HIP ARTHROPLASTY ANTERIOR APPROACH;  Surgeon: Samson Frederic, MD;  Location: [facility] OR;  Service: Orthopedics;  Laterality: Left;   TOTAL KNEE ARTHROPLASTY     right   TOTAL KNEE REVISION Right 01/04/2019   Procedure: TOTAL KNEE REVISION;  Surgeon: Ollen Gross, MD;  Location: [facility] ORS;  Service: Orthopedics;  Laterality: Right;  with block   Patient Active Problem List   Diagnosis Date Noted   Nausea 04/27/2022   Blood loss anemia 04/24/2022   Slow transit  constipation 04/24/2022   Subcapital fracture of femur, left, closed, initial encounter (HCC) 04/15/2022   Fall at home, initial encounter 04/15/2022   CKD (chronic kidney disease) stage 3, GFR 30-59 ml/min (HCC) 04/15/2022   Hypertensive urgency 04/15/2022   Osteoporosis 04/15/2022   Aortic valve disease 07/10/2021   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   Orthostatic dizziness 11/14/2020   Educated about COVID-19 virus infection 12/21/2019   Mild cognitive impairment 09/06/2019   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   Preop cardiovascular exam 12/20/2018   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   Uterine cancer (HCC) 03/28/2018   Arthralgia of hip or thigh 03/28/2018   Benign essential HTN 03/28/2018   Overweight 03/28/2018   Heart disease 11/09/2014   Leukocytosis 10/31/2014   Diastolic congestive heart failure (HCC) 10/30/2014   Hypothyroidism 10/30/2014   Chest pain at rest 10/30/2014   Chest pain 10/30/2014   Endometrial cancer (HCC) 01/30/2014   Dyspnea 01/11/2014   Edema 01/11/2014   Low back pain with right-sided sciatica 12/13/2013   Degeneration of lumbar or lumbosacral intervertebral disc 12/13/2013   Sciatica 12/13/2013    PCP: Dr Darlen Round   REFERRING PROVIDER: Dr Samson Frederic  REFERRING DIAG:  Diagnosis  M62.81 (ICD-10-CM) - Muscle weakness (generalized)    THERAPY DIAG:  Other abnormalities of gait and mobility  Stiffness of left hip, not elsewhere classified  Muscle weakness (generalized)  Pain in left hip  Rationale for Evaluation and Treatment: Rehabilitation  ONSET DATE: 04/18/2023  SUBJECTIVE:   SUBJECTIVE STATEMENT: The patient has no complaints. She is using a cane today.   PERTINENT HISTORY: Osteopetrosis, Low back pain, history of THA, graves disease  PAIN:  Are you having pain? Yes: NPRS scale:  4/10 at the end of the day  Pain location: left lateral hip  Pain description: aching Aggravating factors: as the day goes on Relieving factors: rest   PRECAUTIONS: Fall  WEIGHT BEARING RESTRICTIONS: No   FALLS:  Has patient fallen in last 6 months? No  LIVING ENVIRONMENT: 4 steps to the front door with side rails OCCUPATION:  Retired   Presenter, broadcasting:  Walking   PLOF: Independent  PATIENT GOALS:  The patient would like to return to walking   NEXT MD VISIT: 3-4 months    OBJECTIVE:   DIAGNOSTIC FINDINGS:  X-ray: nothing acute per X ray   PATIENT SURVEYS:  FOTO    COGNITION: Overall cognitive status: Within functional limits for tasks assessed     SENSATION: WFL  EDEMA:  Swelling noted in the left ankle   MUSCLE LENGTH:  POSTURE: No Significant postural limitations  PALPATION: Mild tenderness to palpation in the left hip   LOWER EXTREMITY ROM:  Passive ROM Right eval Left eval  Hip flexion    Hip extension    Hip abduction    Hip adduction    Hip internal rotation    Hip external rotation    Knee flexion    Knee extension    Ankle dorsiflexion    Ankle plantarflexion    Ankle inversion    Ankle eversion     (Blank rows = not tested)  LOWER EXTREMITY MMT:  MMT Right eval Left eval Right  7/15 Left 7/15  Hip flexion 14.0 12.0 24.0 16.2  Hip extension      Hip abduction 17.7 16 20.9 18.6  Hip adduction      Hip internal rotation      Hip external rotation      Knee flexion      Knee extension 16.7 21.8 17.7 26.7  Ankle dorsiflexion      Ankle plantarflexion      Ankle inversion      Ankle eversion       (Blank rows = not tested)    GAIT: Decreased single leg stance time on the left; lateral movement away from left LE;   TODAY'S TREATMENT:                                                                                                                              DATE: 7/23 Nu-step: L4 6 min   Bridge 2x10  Supine  march 2x10    Seated clamshell 3x15 Green   Step up 2x10 4 inch  Lateral 2x10 4 inch   LAQ green 2x10   Partial squats 2x10   7/18 Nu-step: L4 6 min   Sit to stand 3x5  Seated clamshell 3x15 Green   SAQ- 2 lbs 2x15   Step up 2x10 4 inch  Lateral 2x10 4 inch   LAQ green 2x10   Partial squats 2x10     7/15 -Supine marching 1.5# 2x10 - Supine clam shell 2x15 green   - LTR x20 with cuing to slow down and get a stretch on the back end   SAQ- 2 lbs 2x15   Step up 2x10 4 inch  Lateral 2x10 4 inch          7/11 Manual: PROM L hip focus on ER -Supine marching 1.5# 2x10 - Supine clam shell 2x15 red   - LAQ 2x15 4 lbs   Standing hip 3 way 2x10 each way  Standing weight shift x20  -Sit to stands x10    PATIENT EDUCATION:  Education details: reviewed HEP, symptom management  Person educated: Patient Education method: Explanation, Demonstration, Tactile cues, Verbal cues, and Handouts Education comprehension: verbalized understanding, returned demonstration, verbal cues required, tactile cues required, and needs further education  HOME EXERCISE PROGRAM: Access Code: [email or web address] URL: [email or web address] Date: 01/22/2023 Prepared by: Lorayne Bender  Exercises - Supine Quad Set  - 5 x daily - 7 x weekly - 3 sets - 10 reps - Seated Ankle Pumps  - 5 x daily - 7 x weekly - 3 sets - 10 reps - Supine Gluteal Sets  - 5 x daily - 7 x weekly - 3 sets - 10 reps  ASSESSMENT:  CLINICAL IMPRESSION: The patient tolerated treatment well. We reviewed her complete HEP including table and standing exercises. She had no increase in pain> We continue to encourage her to do her exercises at home. Per visual inspection her hip ER is improving. We continue to work on it manually. We continue to also focus on functional exercises such as steps and  squats.   OBJECTIVE IMPAIRMENTS: Abnormal gait, decreased knowledge of use of DME, decreased mobility, difficulty walking,  decreased ROM, decreased strength, and pain.   ACTIVITY LIMITATIONS: bending, sitting, standing, squatting, stairs, and locomotion level  PARTICIPATION LIMITATIONS: meal prep, cleaning, laundry, driving, shopping, community activity, and yard work  PERSONAL FACTORS: Age and 1-2 comorbidities: Low back pain, graves disease  are also affecting patient's functional outcome.   REHAB POTENTIAL: Good  CLINICAL DECISION MAKING: Evolving/moderate complexity  EVALUATION COMPLEXITY: Low declining ability to ambulate    GOALS: Goals reviewed with patient? Yes  SHORT TERM GOALS: Target date:. 03/18/2023      Patient will report doing her exercises at least 3-4 times a week Baseline: Goal status: INITIAL 7/18 doing 1-2   2.  Patient will increase gross left lower extremity strength by 5 pounds Baseline:  Goal status: INITIAL improving but stil limited in some planes 7/18  3.  Patient will increase left hip external rotation by 5 degrees Baseline:  Goal status: INITIAL acieved 7/18    LONG TERM GOALS: Target date: 03/19/2023    Patient report no pain at the end of the day after performing activities Baseline:  Goal status: INITIAL  2.  Patient will go up and down 8 steps with a reciprocal gait pattern without increased pain Baseline:  Goal status: INITIAL  3.  Patient will ambulate community distances  without increased pain Baseline:  Goal status: INITIAL    PLAN:  PT FREQUENCY: 2x/week  PT DURATION: 8 weeks  PLANNED INTERVENTIONS: Therapeutic exercises, Therapeutic activity, Neuromuscular re-education, Balance training, Gait training, Patient/Family education, Self Care, Joint mobilization, Stair training, DME instructions, Aquatic Therapy, Cryotherapy, Moist heat, Ultrasound, Ionotophoresis 4mg /ml Dexamethasone, and Manual therapy  PLAN FOR NEXT SESSION:   Review HEP, keep home program short but make sure we are loading properly. She was advised to make an exercise  routine. Work on balance exercises as well and dynamic gait activity   Referring diagnosis? M62.81 (ICD-10-CM) - Muscle weakness (generalized) Treatment diagnosis? (if different than referring diagnosis)  What was this (referring dx) caused by? [x]  Surgery []  Fall []  Ongoing issue []  Arthritis []  Other: ____________  Laterality: []  Rt [x]  Lt []  Both  Check all possible CPT codes:  *CHOOSE 10 OR LESS*    []  97110 (Therapeutic Exercise)  []  92507 (SLP Treatment)  []  97112 (Neuro Re-ed)   []  92526 (Swallowing Treatment)   []  97116 (Gait Training)   []  [id number] (Cognitive Training, 1st 15 minutes) []  97140 (Manual Therapy)   []  97130 (Cognitive Training, each add'l 15 minutes)  []  97164 (Re-evaluation)                              []  Other, List CPT Code ____________  []  29528 (Therapeutic Activities)     []  97535 (Self Care)   [x]  All codes above (97110 - 97535)  []  97012 (Mechanical Traction)  []  97014 (E-stim Unattended)  []  97032 (E-stim manual)  []  97033 (Ionto)  []  97035 (Ultrasound) []  97750 (Physical Performance Training) []  [id number] (Aquatic Therapy) []  97016 (Vasopneumatic Device) []  [id number] (Paraffin) []  97034 (Contrast Bath) []  97597 (Wound Care 1st 20 sq cm) []  97598 (Wound Care each add'l 20 sq cm) []  97760 (Orthotic Fabrication, Fitting, Training Initial) []  [id number] (Prosthetic Management and Training Initial) []  [id number] (Orthotic or Prosthetic Training/ Modification Subsequent)   Dessie Coma, PT 02/24/2023, 8:08 AM

## 2023-02-24 ENCOUNTER — Encounter (HOSPITAL_BASED_OUTPATIENT_CLINIC_OR_DEPARTMENT_OTHER): Payer: Self-pay | Admitting: Physical Therapy

## 2023-02-25 DIAGNOSIS — Z79899 Other long term (current) drug therapy: Secondary | ICD-10-CM | POA: Diagnosis not present

## 2023-02-25 DIAGNOSIS — E039 Hypothyroidism, unspecified: Secondary | ICD-10-CM | POA: Diagnosis not present

## 2023-02-25 DIAGNOSIS — Z96649 Presence of unspecified artificial hip joint: Secondary | ICD-10-CM | POA: Diagnosis not present

## 2023-02-25 DIAGNOSIS — I509 Heart failure, unspecified: Secondary | ICD-10-CM | POA: Diagnosis not present

## 2023-02-25 DIAGNOSIS — R32 Unspecified urinary incontinence: Secondary | ICD-10-CM | POA: Diagnosis not present

## 2023-02-25 DIAGNOSIS — I110 Hypertensive heart disease with heart failure: Secondary | ICD-10-CM | POA: Diagnosis not present

## 2023-02-25 DIAGNOSIS — I5030 Unspecified diastolic (congestive) heart failure: Secondary | ICD-10-CM | POA: Diagnosis not present

## 2023-02-25 DIAGNOSIS — R2681 Unsteadiness on feet: Secondary | ICD-10-CM | POA: Diagnosis not present

## 2023-02-25 DIAGNOSIS — R600 Localized edema: Secondary | ICD-10-CM | POA: Diagnosis not present

## 2023-02-25 DIAGNOSIS — Z6829 Body mass index (BMI) 29.0-29.9, adult: Secondary | ICD-10-CM | POA: Diagnosis not present

## 2023-02-25 DIAGNOSIS — Z885 Allergy status to narcotic agent status: Secondary | ICD-10-CM | POA: Diagnosis not present

## 2023-02-25 DIAGNOSIS — M810 Age-related osteoporosis without current pathological fracture: Secondary | ICD-10-CM | POA: Diagnosis not present

## 2023-02-25 DIAGNOSIS — Z0000 Encounter for general adult medical examination without abnormal findings: Secondary | ICD-10-CM | POA: Diagnosis not present

## 2023-02-25 DIAGNOSIS — I429 Cardiomyopathy, unspecified: Secondary | ICD-10-CM | POA: Diagnosis not present

## 2023-02-25 DIAGNOSIS — G3184 Mild cognitive impairment, so stated: Secondary | ICD-10-CM | POA: Diagnosis not present

## 2023-02-26 ENCOUNTER — Encounter (HOSPITAL_BASED_OUTPATIENT_CLINIC_OR_DEPARTMENT_OTHER): Payer: Self-pay | Admitting: Physical Therapy

## 2023-02-26 ENCOUNTER — Ambulatory Visit (HOSPITAL_BASED_OUTPATIENT_CLINIC_OR_DEPARTMENT_OTHER): Payer: Medicare PPO | Admitting: Physical Therapy

## 2023-02-26 DIAGNOSIS — M25552 Pain in left hip: Secondary | ICD-10-CM | POA: Diagnosis not present

## 2023-02-26 DIAGNOSIS — M6281 Muscle weakness (generalized): Secondary | ICD-10-CM | POA: Diagnosis not present

## 2023-02-26 DIAGNOSIS — R262 Difficulty in walking, not elsewhere classified: Secondary | ICD-10-CM | POA: Diagnosis not present

## 2023-02-26 DIAGNOSIS — M25652 Stiffness of left hip, not elsewhere classified: Secondary | ICD-10-CM

## 2023-02-26 DIAGNOSIS — R2681 Unsteadiness on feet: Secondary | ICD-10-CM | POA: Diagnosis not present

## 2023-02-26 DIAGNOSIS — R2689 Other abnormalities of gait and mobility: Secondary | ICD-10-CM | POA: Diagnosis not present

## 2023-02-26 NOTE — Therapy (Signed)
OUTPATIENT PHYSICAL THERAPY LOWER EXTREMITY TREATMENT   Patient Name: Terri Ortega MRN: [id number] DOB:07-04-1941, 82 y.o., female Today's Date: 02/26/2023  END OF SESSION:  PT End of Session - 02/26/23 1447     Visit Number 8    Number of Visits 16    Date for PT Re-Evaluation 03/20/23    PT Start Time 1430    PT Stop Time 1510    PT Time Calculation (min) 40 min    Activity Tolerance Patient tolerated treatment well    Behavior During Therapy [facility] for tasks assessed/performed               Past Medical History:  Diagnosis Date   Graves disease    HTN (hypertension)    Low back pain    Dr. Yetta Barre   Osteoporosis    Pneumonia 2012   Radiation 03/20/14, 03/27/14, 04/05/14, 04/12/14, 04/17/14   bracytherapy to proximal vagina 30 gray   Scoliosis    Uterine cancer (HCC)    surgery and radiation x 5 treatments-last 9'15   Vitamin D deficiency    Past Surgical History:  Procedure Laterality Date   BUNIONECTOMY     left   CATARACT EXTRACTION, BILATERAL Bilateral    last done 11-21-14   COLONOSCOPY WITH PROPOFOL N/A 12/25/2014   Procedure: COLONOSCOPY WITH PROPOFOL;  Surgeon: Charolett Bumpers, MD;  Location: [facility];  Service: Endoscopy;  Laterality: N/A;   FOOT SURGERY Left 2004   Dr Ninetta Lights   JOINT REPLACEMENT     RTKA   ROBOTIC ASSISTED TOTAL HYSTERECTOMY WITH BILATERAL SALPINGO OOPHERECTOMY  01/30/14   with lymph node biopsy   TOTAL HIP ARTHROPLASTY Left 04/17/2022   Procedure: TOTAL HIP ARTHROPLASTY ANTERIOR APPROACH;  Surgeon: Samson Frederic, MD;  Location: [facility];  Service: Orthopedics;  Laterality: Left;   TOTAL KNEE ARTHROPLASTY     right   TOTAL KNEE REVISION Right 01/04/2019   Procedure: TOTAL KNEE REVISION;  Surgeon: Ollen Gross, MD;  Location: [facility];  Service: Orthopedics;  Laterality: Right;  with block   Patient Active Problem List   Diagnosis Date Noted   Nausea 04/27/2022   Blood loss anemia 04/24/2022   Slow transit  constipation 04/24/2022   Subcapital fracture of femur, left, closed, initial encounter (HCC) 04/15/2022   Fall at home, initial encounter 04/15/2022   CKD (chronic kidney disease) stage 3, GFR 30-59 ml/min (HCC) 04/15/2022   Hypertensive urgency 04/15/2022   Osteoporosis 04/15/2022   Aortic valve disease 07/10/2021   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   Orthostatic dizziness 11/14/2020   Educated about COVID-19 virus infection 12/21/2019   Mild cognitive impairment 09/06/2019   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   Preop cardiovascular exam 12/20/2018   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   Uterine cancer (HCC) 03/28/2018   Arthralgia of hip or thigh 03/28/2018   Benign essential HTN 03/28/2018   Overweight 03/28/2018   Heart disease 11/09/2014   Leukocytosis 10/31/2014   Diastolic congestive heart failure (HCC) 10/30/2014   Hypothyroidism 10/30/2014   Chest pain at rest 10/30/2014   Chest pain 10/30/2014   Endometrial cancer (HCC) 01/30/2014   Dyspnea 01/11/2014   Edema 01/11/2014   Low back pain with right-sided sciatica 12/13/2013   Degeneration of lumbar or lumbosacral intervertebral disc 12/13/2013   Sciatica 12/13/2013    PCP: Dr Darlen Round   REFERRING PROVIDER: Dr Samson Frederic  REFERRING DIAG:  Diagnosis  M62.81 (ICD-10-CM) - Muscle weakness (generalized)    THERAPY DIAG:  Other abnormalities of gait and mobility  Stiffness of left hip, not elsewhere classified  Muscle weakness (generalized)  Pain in left hip  Rationale for Evaluation and Treatment: Rehabilitation  ONSET DATE: 04/18/2023  SUBJECTIVE:   SUBJECTIVE STATEMENT: The patient has no complaints. She is using a cane today.   PERTINENT HISTORY: Osteopetrosis, Low back pain, history of THA, graves disease  PAIN:  Are you having pain? Yes: NPRS scale:  4/10 at the end of the day  Pain location: left lateral hip  Pain description: aching Aggravating factors: as the day goes on Relieving factors: rest   PRECAUTIONS: Fall  WEIGHT BEARING RESTRICTIONS: No   FALLS:  Has patient fallen in last 6 months? No  LIVING ENVIRONMENT: 4 steps to the front door with side rails OCCUPATION:  Retired   Presenter, broadcasting:  Walking   PLOF: Independent  PATIENT GOALS:  The patient would like to return to walking   NEXT MD VISIT: 3-4 months    OBJECTIVE:   DIAGNOSTIC FINDINGS:  X-ray: nothing acute per X ray   PATIENT SURVEYS:  FOTO    COGNITION: Overall cognitive status: Within functional limits for tasks assessed     SENSATION: WFL  EDEMA:  Swelling noted in the left ankle   MUSCLE LENGTH:  POSTURE: No Significant postural limitations  PALPATION: Mild tenderness to palpation in the left hip   LOWER EXTREMITY ROM:  Passive ROM Right eval Left eval  Hip flexion    Hip extension    Hip abduction    Hip adduction    Hip internal rotation    Hip external rotation    Knee flexion    Knee extension    Ankle dorsiflexion    Ankle plantarflexion    Ankle inversion    Ankle eversion     (Blank rows = not tested)  LOWER EXTREMITY MMT:  MMT Right eval Left eval Right  7/15 Left 7/15  Hip flexion 14.0 12.0 24.0 16.2  Hip extension      Hip abduction 17.7 16 20.9 18.6  Hip adduction      Hip internal rotation      Hip external rotation      Knee flexion      Knee extension 16.7 21.8 17.7 26.7  Ankle dorsiflexion      Ankle plantarflexion      Ankle inversion      Ankle eversion       (Blank rows = not tested)    GAIT: Decreased single leg stance time on the left; lateral movement away from left LE;   TODAY'S TREATMENT:                                                                                                                              DATE: 7/26 Nu-step: L4 6 min   Bridge 2x10  Supine  march 2x10    Seated clamshell 3x15 Green   Hurdle steps 3 hurdles x20   Heel/toe rock and catch x20   Hurdle fwd and back step x20   Air-ex step left leg x20   LAQ green 2x10   Partial squats 2x10    7/23 Nu-step: L4 6 min   Bridge 2x10  Supine march 2x10   Seated clamshell 3x15 Green   Step up 2x10 4 inch  Lateral 2x10 4 inch   LAQ green 2x10   Partial squats 2x10   7/18 Nu-step: L4 6 min   Sit to stand 3x5  Seated clamshell 3x15 Green   SAQ- 2 lbs 2x15   Step up 2x10 4 inch  Lateral 2x10 4 inch   LAQ green 2x10   Partial squats 2x10     7/15 -Supine marching 1.5# 2x10 - Supine clam shell 2x15 green   - LTR x20 with cuing to slow down and get a stretch on the back end   SAQ- 2 lbs 2x15   Step up 2x10 4 inch  Lateral 2x10 4 inch          7/11 Manual: PROM L hip focus on ER -Supine marching 1.5# 2x10 - Supine clam shell 2x15 red   - LAQ 2x15 4 lbs   Standing hip 3 way 2x10 each way  Standing weight shift x20  -Sit to stands x10    PATIENT EDUCATION:  Education details: reviewed HEP, symptom management  Person educated: Patient Education method: Explanation, Demonstration, Tactile cues, Verbal cues, and Handouts Education comprehension: verbalized understanding, returned demonstration, verbal cues required, tactile cues required, and needs further education  HOME EXERCISE PROGRAM: Access Code: [email or web address] URL: [email or web address] Date: 01/22/2023 Prepared by: Lorayne Bender  Exercises - Supine Quad Set  - 5 x daily - 7 x weekly - 3 sets - 10 reps - Seated Ankle Pumps  - 5 x daily - 7 x weekly - 3 sets - 10 reps - Supine Gluteal Sets  - 5 x daily - 7 x weekly - 3 sets - 10 reps  ASSESSMENT:  CLINICAL IMPRESSION: The patient worked on more dynamic movements today. We added in hurdle steps and coordinated stepping. She did well. She did to have any increase in pain.  We continue to encourage her to work on her exercises  on her own at home. We will do a formal re-assment next week.    OBJECTIVE IMPAIRMENTS: Abnormal gait, decreased knowledge of use of DME, decreased mobility, difficulty walking, decreased ROM, decreased strength, and pain.   ACTIVITY LIMITATIONS: bending, sitting, standing, squatting, stairs, and locomotion level  PARTICIPATION LIMITATIONS: meal prep, cleaning, laundry, driving, shopping, community activity, and yard work  PERSONAL FACTORS: Age and 1-2 comorbidities: Low back pain, graves disease  are also affecting patient's functional outcome.   REHAB POTENTIAL: Good  CLINICAL DECISION MAKING: Evolving/moderate complexity  EVALUATION COMPLEXITY: Low declining ability to ambulate    GOALS: Goals reviewed with patient? Yes  SHORT TERM GOALS: Target date:. 03/18/2023      Patient will report doing her exercises at least 3-4 times a week Baseline: Goal status: INITIAL 7/18 doing 1-2   2.  Patient will increase gross left lower extremity strength by 5 pounds Baseline:  Goal status: INITIAL improving but stil limited in some planes 7/18  3.  Patient will increase left hip external rotation by 5 degrees Baseline:  Goal status: INITIAL acieved 7/18    LONG TERM GOALS: Target date: 03/19/2023  Patient report no pain at the end of the day after performing activities Baseline:  Goal status: INITIAL  2.  Patient will go up and down 8 steps with a reciprocal gait pattern without increased pain Baseline:  Goal status: INITIAL  3.  Patient will ambulate community distances without increased pain Baseline:  Goal status: INITIAL    PLAN:  PT FREQUENCY: 2x/week  PT DURATION: 8 weeks  PLANNED INTERVENTIONS: Therapeutic exercises, Therapeutic activity, Neuromuscular re-education, Balance training, Gait training, Patient/Family education, Self Care, Joint mobilization, Stair training, DME instructions, Aquatic Therapy, Cryotherapy, Moist heat, Ultrasound, Ionotophoresis  4mg /ml Dexamethasone, and Manual therapy  PLAN FOR NEXT SESSION:   Review HEP, keep home program short but make sure we are loading properly. She was advised to make an exercise routine. Work on balance exercises as well and dynamic gait activity   Referring diagnosis? M62.81 (ICD-10-CM) - Muscle weakness (generalized) Treatment diagnosis? (if different than referring diagnosis)  What was this (referring dx) caused by? [x]  Surgery []  Fall []  Ongoing issue []  Arthritis []  Other: ____________  Laterality: []  Rt [x]  Lt []  Both  Check all possible CPT codes:  *CHOOSE 10 OR LESS*    []  97110 (Therapeutic Exercise)  []  92507 (SLP Treatment)  []  97112 (Neuro Re-ed)   []  92526 (Swallowing Treatment)   []  97116 (Gait Training)   []  [id number] (Cognitive Training, 1st 15 minutes) []  97140 (Manual Therapy)   []  97130 (Cognitive Training, each add'l 15 minutes)  []  97164 (Re-evaluation)                              []  Other, List CPT Code ____________  []  97530 (Therapeutic Activities)     []  97535 (Self Care)   [x]  All codes above (97110 - 97535)  []  97012 (Mechanical Traction)  []  97014 (E-stim Unattended)  []  97032 (E-stim manual)  []  97033 (Ionto)  []  97035 (Ultrasound) []  97750 (Physical Performance Training) []  [id number] (Aquatic Therapy) []  97016 (Vasopneumatic Device) []  [id number] (Paraffin) []  97034 (Contrast Bath) []  97597 (Wound Care 1st 20 sq cm) []  97598 (Wound Care each add'l 20 sq cm) []  97760 (Orthotic Fabrication, Fitting, Training Initial) []  [id number] (Prosthetic Management and Training Initial) []  [id number] (Orthotic or Prosthetic Training/ Modification Subsequent)   Dessie Coma, PT 02/26/2023, 2:48 PM

## 2023-03-02 ENCOUNTER — Encounter (HOSPITAL_BASED_OUTPATIENT_CLINIC_OR_DEPARTMENT_OTHER): Payer: Self-pay | Admitting: Physical Therapy

## 2023-03-02 ENCOUNTER — Ambulatory Visit (HOSPITAL_BASED_OUTPATIENT_CLINIC_OR_DEPARTMENT_OTHER): Payer: Medicare PPO | Admitting: Physical Therapy

## 2023-03-02 DIAGNOSIS — M6281 Muscle weakness (generalized): Secondary | ICD-10-CM | POA: Diagnosis not present

## 2023-03-02 DIAGNOSIS — M25652 Stiffness of left hip, not elsewhere classified: Secondary | ICD-10-CM | POA: Diagnosis not present

## 2023-03-02 DIAGNOSIS — R2681 Unsteadiness on feet: Secondary | ICD-10-CM | POA: Diagnosis not present

## 2023-03-02 DIAGNOSIS — R2689 Other abnormalities of gait and mobility: Secondary | ICD-10-CM

## 2023-03-02 DIAGNOSIS — M25552 Pain in left hip: Secondary | ICD-10-CM | POA: Diagnosis not present

## 2023-03-02 DIAGNOSIS — R262 Difficulty in walking, not elsewhere classified: Secondary | ICD-10-CM | POA: Diagnosis not present

## 2023-03-02 NOTE — Therapy (Signed)
OUTPATIENT PHYSICAL THERAPY LOWER EXTREMITY TREATMENT   Patient Name: Terri Ortega MRN: [id number] DOB:[birth year], 82 y.o., female Today's Date: 03/02/2023  END OF SESSION:  PT End of Session - 03/02/23 0934     Visit Number 9    Number of Visits 16    Date for PT Re-Evaluation 03/20/23    PT Start Time 0928    PT Stop Time 1015    PT Time Calculation (min) 47 min    Activity Tolerance Patient tolerated treatment well    Behavior During Therapy [facility] for tasks assessed/performed               Past Medical History:  Diagnosis Date   Graves disease    HTN (hypertension)    Low back pain    Dr. Yetta Barre   Osteoporosis    Pneumonia 2012   Radiation 03/20/14, 03/27/14, 04/05/14, 04/12/14, 04/17/14   bracytherapy to proximal vagina 30 gray   Scoliosis    Uterine cancer (HCC)    surgery and radiation x 5 treatments-last 9'15   Vitamin D deficiency    Past Surgical History:  Procedure Laterality Date   BUNIONECTOMY     left   CATARACT EXTRACTION, BILATERAL Bilateral    last done 11-21-14   COLONOSCOPY WITH PROPOFOL N/A 12/25/2014   Procedure: COLONOSCOPY WITH PROPOFOL;  Surgeon: Charolett Bumpers, MD;  Location: [facility];  Service: Endoscopy;  Laterality: N/A;   FOOT SURGERY Left 2004   Dr Ninetta Lights   JOINT REPLACEMENT     RTKA   ROBOTIC ASSISTED TOTAL HYSTERECTOMY WITH BILATERAL SALPINGO OOPHERECTOMY  01/30/14   with lymph node biopsy   TOTAL HIP ARTHROPLASTY Left 04/17/2022   Procedure: TOTAL HIP ARTHROPLASTY ANTERIOR APPROACH;  Surgeon: Samson Frederic, MD;  Location: [facility] OR;  Service: Orthopedics;  Laterality: Left;   TOTAL KNEE ARTHROPLASTY     right   TOTAL KNEE REVISION Right 01/04/2019   Procedure: TOTAL KNEE REVISION;  Surgeon: Ollen Gross, MD;  Location: [facility];  Service: Orthopedics;  Laterality: Right;  with block   Patient Active Problem List   Diagnosis Date Noted   Nausea 04/27/2022   Blood loss anemia 04/24/2022   Slow transit  constipation 04/24/2022   Subcapital fracture of femur, left, closed, initial encounter (HCC) 04/15/2022   Fall at home, initial encounter 04/15/2022   CKD (chronic kidney disease) stage 3, GFR 30-59 ml/min (HCC) 04/15/2022   Hypertensive urgency 04/15/2022   Osteoporosis 04/15/2022   Aortic valve disease 07/10/2021   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   Orthostatic dizziness 11/14/2020   Educated about COVID-19 virus infection 12/21/2019   Mild cognitive impairment 09/06/2019   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   Preop cardiovascular exam 12/20/2018   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   Uterine cancer (HCC) 03/28/2018   Arthralgia of hip or thigh 03/28/2018   Benign essential HTN 03/28/2018   Overweight 03/28/2018   Heart disease 11/09/2014   Leukocytosis 10/31/2014   Diastolic congestive heart failure (HCC) 10/30/2014   Hypothyroidism 10/30/2014   Chest pain at rest 10/30/2014   Chest pain 10/30/2014   Endometrial cancer (HCC) 01/30/2014   Dyspnea 01/11/2014   Edema 01/11/2014   Low back pain with right-sided sciatica 12/13/2013   Degeneration of lumbar or lumbosacral intervertebral disc 12/13/2013   Sciatica 12/13/2013    PCP: Dr Darlen Round   REFERRING PROVIDER: Dr Samson Frederic  REFERRING DIAG:  Diagnosis  M62.81 (ICD-10-CM) - Muscle weakness (generalized)    THERAPY DIAG:  Other abnormalities of gait and mobility  Stiffness of left hip, not elsewhere classified  Muscle weakness (generalized)  Pain in left hip  Rationale for Evaluation and Treatment: Rehabilitation  ONSET DATE: 04/18/2023  SUBJECTIVE:   SUBJECTIVE STATEMENT: The patient reports she woke up with a bit of a catch today in the front of her hip. She comes in today without a cane.   PERTINENT HISTORY: Osteopetrosis, Low back pain, history of THA,  graves disease  PAIN:  Are you having pain? Yes: NPRS scale: 3-4/10 " catch in the hip  Pain location: left lateral hip  Pain description: aching Aggravating factors: just woke up with it  Relieving factors: rest   PRECAUTIONS: Fall  WEIGHT BEARING RESTRICTIONS: No   FALLS:  Has patient fallen in last 6 months? No  LIVING ENVIRONMENT: 4 steps to the front door with side rails OCCUPATION:  Retired   Presenter, broadcasting:  Walking   PLOF: Independent  PATIENT GOALS:  The patient would like to return to walking   NEXT MD VISIT: 3-4 months    OBJECTIVE:   DIAGNOSTIC FINDINGS:  X-ray: nothing acute per X ray   PATIENT SURVEYS:  FOTO    COGNITION: Overall cognitive status: Within functional limits for tasks assessed     SENSATION: WFL  EDEMA:  Swelling noted in the left ankle   MUSCLE LENGTH:  POSTURE: No Significant postural limitations  PALPATION: Mild tenderness to palpation in the left hip   LOWER EXTREMITY ROM:  Passive ROM Right eval Left eval  Hip flexion    Hip extension    Hip abduction    Hip adduction    Hip internal rotation    Hip external rotation    Knee flexion    Knee extension    Ankle dorsiflexion    Ankle plantarflexion    Ankle inversion    Ankle eversion     (Blank rows = not tested)  LOWER EXTREMITY MMT:  MMT Right eval Left eval Right  7/15 Left 7/15  Hip flexion 14.0 12.0 24.0 16.2  Hip extension      Hip abduction 17.7 16 20.9 18.6  Hip adduction      Hip internal rotation      Hip external rotation      Knee flexion      Knee extension 16.7 21.8 17.7 26.7  Ankle dorsiflexion      Ankle plantarflexion      Ankle inversion      Ankle eversion       (Blank rows = not tested)    GAIT: Decreased single leg stance time on the left; lateral movement away from left LE;   TODAY'S TREATMENT:  DATE: 7/30 Nu-step: L4 6 min   Bridge 2x10  Supine march 2x10  SAQ 2x10 2lbs   Side stepping 3 laps up and down the counter   Side stepping over hurdle x20   Patrial squats 2x10  Heel/ toe rock with balance backwards 2x10   Manual: PROM into flexion and ER; roller to anterior hip   7/23 Nu-step: L4 6 min   Bridge 2x10  Supine march 2x10   Seated clamshell 3x15 Green   Step up 2x10 4 inch  Lateral 2x10 4 inch   LAQ green 2x10   Partial squats 2x10   7/18 Nu-step: L4 6 min   Sit to stand 3x5  Seated clamshell 3x15 Green   SAQ- 2 lbs 2x15   Step up 2x10 4 inch  Lateral 2x10 4 inch   LAQ green 2x10   Partial squats 2x10     7/15 -Supine marching 1.5# 2x10 - Supine clam shell 2x15 green   - LTR x20 with cuing to slow down and get a stretch on the back end   SAQ- 2 lbs 2x15   Step up 2x10 4 inch  Lateral 2x10 4 inch          7/11 Manual: PROM L hip focus on ER -Supine marching 1.5# 2x10 - Supine clam shell 2x15 red   - LAQ 2x15 4 lbs   Standing hip 3 way 2x10 each way  Standing weight shift x20  -Sit to stands x10    PATIENT EDUCATION:  Education details: reviewed HEP, symptom management  Person educated: Patient Education method: Explanation, Demonstration, Tactile cues, Verbal cues, and Handouts Education comprehension: verbalized understanding, returned demonstration, verbal cues required, tactile cues required, and needs further education  HOME EXERCISE PROGRAM: Access Code: [email or web address] URL: [email or web address] Date: 01/22/2023 Prepared by: Lorayne Bender  Exercises - Supine Quad Set  - 5 x daily - 7 x weekly - 3 sets - 10 reps - Seated Ankle Pumps  - 5 x daily - 7 x weekly - 3 sets - 10 reps - Supine Gluteal Sets  - 5 x daily - 7 x weekly - 3 sets - 10 reps  ASSESSMENT:  CLINICAL IMPRESSION: We continue to work on dynamic standing exercises. She was advised to schedule more appointments if she plans on  continuing. We will do a re-eval next visit. She tolerated standing exercises well> She was able to step over the hurdles without much pain. She did have a large trigger point in the anterior hip. We advised her to put pressure herself if she feels the catch.   OBJECTIVE IMPAIRMENTS: Abnormal gait, decreased knowledge of use of DME, decreased mobility, difficulty walking, decreased ROM, decreased strength, and pain.   ACTIVITY LIMITATIONS: bending, sitting, standing, squatting, stairs, and locomotion level  PARTICIPATION LIMITATIONS: meal prep, cleaning, laundry, driving, shopping, community activity, and yard work  PERSONAL FACTORS: Age and 1-2 comorbidities: Low back pain, graves disease  are also affecting patient's functional outcome.   REHAB POTENTIAL: Good  CLINICAL DECISION MAKING: Evolving/moderate complexity  EVALUATION COMPLEXITY: Low declining ability to ambulate    GOALS: Goals reviewed with patient? Yes  SHORT TERM GOALS: Target date:. 03/18/2023      Patient will report doing her exercises at least 3-4 times a week Baseline: Goal status: INITIAL 7/18 doing 1-2   2.  Patient will increase gross left lower extremity strength by 5 pounds Baseline:  Goal status: INITIAL improving but stil limited in some planes 7/18  3.  Patient  will increase left hip external rotation by 5 degrees Baseline:  Goal status: INITIAL acieved 7/18    LONG TERM GOALS: Target date: 03/19/2023    Patient report no pain at the end of the day after performing activities Baseline:  Goal status: INITIAL  2.  Patient will go up and down 8 steps with a reciprocal gait pattern without increased pain Baseline:  Goal status: INITIAL  3.  Patient will ambulate community distances without increased pain Baseline:  Goal status: INITIAL    PLAN:  PT FREQUENCY: 2x/week  PT DURATION: 8 weeks  PLANNED INTERVENTIONS: Therapeutic exercises, Therapeutic activity, Neuromuscular  re-education, Balance training, Gait training, Patient/Family education, Self Care, Joint mobilization, Stair training, DME instructions, Aquatic Therapy, Cryotherapy, Moist heat, Ultrasound, Ionotophoresis 4mg /ml Dexamethasone, and Manual therapy  PLAN FOR NEXT SESSION:   Review HEP, keep home program short but make sure we are loading properly. She was advised to make an exercise routine. Work on balance exercises as well and dynamic gait activity   Referring diagnosis? M62.81 (ICD-10-CM) - Muscle weakness (generalized) Treatment diagnosis? (if different than referring diagnosis)  What was this (referring dx) caused by? [x]  Surgery []  Fall []  Ongoing issue []  Arthritis []  Other: ____________  Laterality: []  Rt [x]  Lt []  Both  Check all possible CPT codes:  *CHOOSE 10 OR LESS*    []  97110 (Therapeutic Exercise)  []  92507 (SLP Treatment)  []  97112 (Neuro Re-ed)   []  92526 (Swallowing Treatment)   []  97116 (Gait Training)   []  [id number] (Cognitive Training, 1st 15 minutes) []  97140 (Manual Therapy)   []  11914 (Cognitive Training, each add'l 15 minutes)  []  97164 (Re-evaluation)                              []  Other, List CPT Code ____________  []  97530 (Therapeutic Activities)     []  97535 (Self Care)   [x]  All codes above (97110 - 97535)  []  97012 (Mechanical Traction)  []  97014 (E-stim Unattended)  []  97032 (E-stim manual)  []  97033 (Ionto)  []  97035 (Ultrasound) []  97750 (Physical Performance Training) []  [id number] (Aquatic Therapy) []  97016 (Vasopneumatic Device) []  [id number] (Paraffin) []  97034 (Contrast Bath) []  97597 (Wound Care 1st 20 sq cm) []  97598 (Wound Care each add'l 20 sq cm) []  97760 (Orthotic Fabrication, Fitting, Training Initial) []  [id number] (Prosthetic Management and Training Initial) []  [id number] (Orthotic or Prosthetic Training/ Modification Subsequent)   Dessie Coma, PT 03/02/2023, 9:35 AM

## 2023-03-04 ENCOUNTER — Ambulatory Visit (HOSPITAL_BASED_OUTPATIENT_CLINIC_OR_DEPARTMENT_OTHER): Payer: Medicare PPO | Attending: Orthopedic Surgery | Admitting: Physical Therapy

## 2023-03-04 DIAGNOSIS — R2689 Other abnormalities of gait and mobility: Secondary | ICD-10-CM | POA: Insufficient documentation

## 2023-03-04 DIAGNOSIS — Z136 Encounter for screening for cardiovascular disorders: Secondary | ICD-10-CM | POA: Diagnosis not present

## 2023-03-04 DIAGNOSIS — M25552 Pain in left hip: Secondary | ICD-10-CM | POA: Insufficient documentation

## 2023-03-04 DIAGNOSIS — M6281 Muscle weakness (generalized): Secondary | ICD-10-CM | POA: Insufficient documentation

## 2023-03-04 DIAGNOSIS — E039 Hypothyroidism, unspecified: Secondary | ICD-10-CM | POA: Diagnosis not present

## 2023-03-04 DIAGNOSIS — Z0000 Encounter for general adult medical examination without abnormal findings: Secondary | ICD-10-CM | POA: Diagnosis not present

## 2023-03-04 DIAGNOSIS — M25652 Stiffness of left hip, not elsewhere classified: Secondary | ICD-10-CM | POA: Insufficient documentation

## 2023-03-04 DIAGNOSIS — M810 Age-related osteoporosis without current pathological fracture: Secondary | ICD-10-CM | POA: Diagnosis not present

## 2023-03-04 DIAGNOSIS — Z79899 Other long term (current) drug therapy: Secondary | ICD-10-CM | POA: Diagnosis not present

## 2023-03-04 DIAGNOSIS — I10 Essential (primary) hypertension: Secondary | ICD-10-CM | POA: Diagnosis not present

## 2023-03-04 NOTE — Therapy (Signed)
OUTPATIENT PHYSICAL THERAPY LOWER EXTREMITY TREATMENT/Discharge    Patient Name: Elvera Almario MRN: [id number] DOB:[birth year], 82 y.o., female Today's Date: 03/04/2023  Progress Note Reporting Period 01/22/2023 to 03/05/2023  See note below for Objective Data and Assessment of Progress/Goals.      END OF SESSION:  PT End of Session - 03/04/23 1202     Visit Number 10    Number of Visits 16    Date for PT Re-Evaluation 03/20/23    PT Start Time 1145    PT Stop Time 1228    PT Time Calculation (min) 43 min    Activity Tolerance Patient tolerated treatment well    Behavior During Therapy [facility] for tasks assessed/performed             OUTPATIENT PHYSICAL THERAPY LOWER EXTREMITY TREATMENT   Patient Name: Eugene Zeiders MRN: [id number] DOB:[birth year], 82 y.o., female Today's Date: 03/04/2023  END OF SESSION:  PT End of Session - 03/04/23 1202     Visit Number 10    Number of Visits 16    Date for PT Re-Evaluation 03/20/23    PT Start Time 1145    PT Stop Time 1228    PT Time Calculation (min) 43 min    Activity Tolerance Patient tolerated treatment well    Behavior During Therapy [facility] for tasks assessed/performed               Past Medical History:  Diagnosis Date   Graves disease    HTN (hypertension)    Low back pain    Dr. Yetta Barre   Osteoporosis    Pneumonia 2012   Radiation 03/20/14, 03/27/14, 04/05/14, 04/12/14, 04/17/14   bracytherapy to proximal vagina 30 gray   Scoliosis    Uterine cancer (HCC)    surgery and radiation x 5 treatments-last 9'15   Vitamin D deficiency    Past Surgical History:  Procedure Laterality Date   BUNIONECTOMY     left   CATARACT EXTRACTION, BILATERAL Bilateral    last done 11-21-14   COLONOSCOPY WITH PROPOFOL N/A 12/25/2014   Procedure: COLONOSCOPY WITH PROPOFOL;  Surgeon: Charolett Bumpers, MD;  Location: [facility];  Service: Endoscopy;  Laterality: N/A;   FOOT SURGERY Left 2004   Dr Ninetta Lights   JOINT  REPLACEMENT     RTKA   ROBOTIC ASSISTED TOTAL HYSTERECTOMY WITH BILATERAL SALPINGO OOPHERECTOMY  01/30/14   with lymph node biopsy   TOTAL HIP ARTHROPLASTY Left 04/17/2022   Procedure: TOTAL HIP ARTHROPLASTY ANTERIOR APPROACH;  Surgeon: Samson Frederic, MD;  Location: [facility] OR;  Service: Orthopedics;  Laterality: Left;   TOTAL KNEE ARTHROPLASTY     right   TOTAL KNEE REVISION Right 01/04/2019   Procedure: TOTAL KNEE REVISION;  Surgeon: Ollen Gross, MD;  Location: [facility] ORS;  Service: Orthopedics;  Laterality: Right;  with block   Patient Active Problem List   Diagnosis Date Noted   Nausea 04/27/2022   Blood loss anemia 04/24/2022   Slow transit constipation 04/24/2022   Subcapital fracture of femur, left, closed, initial encounter (HCC) 04/15/2022   Fall at home, initial encounter 04/15/2022   CKD (chronic kidney disease) stage 3, GFR 30-59 ml/min (HCC) 04/15/2022   Hypertensive urgency 04/15/2022   Osteoporosis 04/15/2022   Aortic valve disease 07/10/2021   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   Orthostatic dizziness 11/14/2020   Educated about COVID-19 virus infection 12/21/2019   Mild cognitive impairment 09/06/2019   Failed total  knee arthroplasty (HCC) 01/04/2019   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   Preop cardiovascular exam 12/20/2018   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   Uterine cancer (HCC) 03/28/2018   Arthralgia of hip or thigh 03/28/2018   Benign essential HTN 03/28/2018   Overweight 03/28/2018   Heart disease 11/09/2014   Leukocytosis 10/31/2014   Diastolic congestive heart failure (HCC) 10/30/2014   Hypothyroidism 10/30/2014   Chest pain at rest 10/30/2014   Chest pain 10/30/2014   Endometrial cancer (HCC) 01/30/2014   Dyspnea 01/11/2014   Edema 01/11/2014   Low back pain with right-sided sciatica 12/13/2013   Degeneration of lumbar or lumbosacral intervertebral disc  12/13/2013   Sciatica 12/13/2013    PCP: Dr Darlen Round   REFERRING PROVIDER: Dr Samson Frederic   REFERRING DIAG:  Diagnosis  M62.81 (ICD-10-CM) - Muscle weakness (generalized)    THERAPY DIAG:  Other abnormalities of gait and mobility  Stiffness of left hip, not elsewhere classified  Muscle weakness (generalized)  Pain in left hip  Rationale for Evaluation and Treatment: Rehabilitation  ONSET DATE: 04/18/2023  SUBJECTIVE:   SUBJECTIVE STATEMENT: The patient reports everything is about the same.   PERTINENT HISTORY: Osteopetrosis, Low back pain, history of THA, graves disease  PAIN:  Are you having pain? Yes: NPRS scale: 3-4/10 " catch in the hip  Pain location: left lateral hip  Pain description: aching Aggravating factors: just woke up with it  Relieving factors: rest   PRECAUTIONS: Fall  WEIGHT BEARING RESTRICTIONS: No   FALLS:  Has patient fallen in last 6 months? No  LIVING ENVIRONMENT: 4 steps to the front door with side rails OCCUPATION:  Retired   Presenter, broadcasting:  Walking   PLOF: Independent  PATIENT GOALS:  The patient would like to return to walking   NEXT MD VISIT: 3-4 months    OBJECTIVE:   DIAGNOSTIC FINDINGS:  X-ray: nothing acute per X ray   PATIENT SURVEYS:  FOTO    COGNITION: Overall cognitive status: Within functional limits for tasks assessed     SENSATION: WFL  EDEMA:  Swelling noted in the left ankle   MUSCLE LENGTH:  POSTURE: No Significant postural limitations  PALPATION: Mild tenderness to palpation in the left hip   LOWER EXTREMITY ROM:  Passive ROM Right eval Left eval  Hip flexion    Hip extension    Hip abduction    Hip adduction    Hip internal rotation    Hip external rotation    Knee flexion    Knee extension    Ankle dorsiflexion    Ankle plantarflexion    Ankle inversion    Ankle eversion     (Blank rows = not tested)  LOWER EXTREMITY MMT:  MMT Right eval Left eval Right  7/15  Left 7/15  Hip flexion 14.0 12.0 24.0 16.2  Hip extension      Hip abduction 17.7 16 20.9 18.6  Hip adduction      Hip internal rotation      Hip external rotation      Knee flexion      Knee extension 16.7 21.8 17.7 26.7  Ankle dorsiflexion      Ankle plantarflexion      Ankle inversion      Ankle eversion       (Blank rows = not tested)    GAIT: Decreased single leg stance time on the left; lateral movement away from left LE;   TODAY'S TREATMENT:  DATE: 8/1 Nu-step: L4 6 min   Bridge 2x10  Supine march 2x10  SAQ 2x10 2lbs   LAQ 2x10   Standing hip 3 way 2x10  Partial squats 2x10   7/30 Nu-step: L4 6 min   Bridge 2x10  Supine march 2x10  SAQ 2x10 2lbs   Side stepping 3 laps up and down the counter   Side stepping over hurdle x20   Patrial squats 2x10  Heel/ toe rock with balance backwards 2x10   Manual: PROM into flexion and ER; roller to anterior hip   7/23 Nu-step: L4 6 min   Bridge 2x10  Supine march 2x10   Seated clamshell 3x15 Green   Step up 2x10 4 inch  Lateral 2x10 4 inch   LAQ green 2x10   Partial squats 2x10   7/18 Nu-step: L4 6 min   Sit to stand 3x5  Seated clamshell 3x15 Green   SAQ- 2 lbs 2x15   Step up 2x10 4 inch  Lateral 2x10 4 inch   LAQ green 2x10   Partial squats 2x10     7/15 -Supine marching 1.5# 2x10 - Supine clam shell 2x15 green   - LTR x20 with cuing to slow down and get a stretch on the back end   SAQ- 2 lbs 2x15   Step up 2x10 4 inch  Lateral 2x10 4 inch          7/11 Manual: PROM L hip focus on ER -Supine marching 1.5# 2x10 - Supine clam shell 2x15 red   - LAQ 2x15 4 lbs   Standing hip 3 way 2x10 each way  Standing weight shift x20  -Sit to stands x10    PATIENT EDUCATION:  Education details: reviewed HEP, symptom management  Person educated:  Patient Education method: Explanation, Demonstration, Tactile cues, Verbal cues, and Handouts Education comprehension: verbalized understanding, returned demonstration, verbal cues required, tactile cues required, and needs further education  HOME EXERCISE PROGRAM: Access Code: [email or web address] URL: [email or web address] Date: 01/22/2023 Prepared by: Lorayne Bender  Exercises - Supine Quad Set  - 5 x daily - 7 x weekly - 3 sets - 10 reps - Seated Ankle Pumps  - 5 x daily - 7 x weekly - 3 sets - 10 reps - Supine Gluteal Sets  - 5 x daily - 7 x weekly - 3 sets - 10 reps  ASSESSMENT:  CLINICAL IMPRESSION: The patient is walking more without a cane. She was encouraged to continue using the cane for now. Otherwise she hasn't made much improvement. She appears to not be doing her exercises. She says she does them sometimes. Her strength numbers and functional outcome measure are the same as they were a month ago. She has everything she needs to get better she just needs to do it. We reviewed her HEP and will Discharge at this time. See below for goals specific progress.  OBJECTIVE IMPAIRMENTS: Abnormal gait, decreased knowledge of use of DME, decreased mobility, difficulty walking, decreased ROM, decreased strength, and pain.   ACTIVITY LIMITATIONS: bending, sitting, standing, squatting, stairs, and locomotion level  PARTICIPATION LIMITATIONS: meal prep, cleaning, laundry, driving, shopping, community activity, and yard work  PERSONAL FACTORS: Age and 1-2 comorbidities: Low back pain, graves disease  are also affecting patient's functional outcome.   REHAB POTENTIAL: Good  CLINICAL DECISION MAKING: Evolving/moderate complexity  EVALUATION COMPLEXITY: Low declining ability to ambulate    GOALS: Goals reviewed with patient? Yes  SHORT TERM GOALS: Target date:. 03/18/2023  Patient will report doing her exercises at least 3-4 times a week Baseline: Goal status: INITIAL 8/1  Not really doing them not achieved 8/1  2.  Patient will increase gross left lower extremity strength by 5 pounds Baseline:  Goal status: INITIAL improving but stil limited in some planes 7/18  3.  Patient will increase left hip external rotation by 5 degrees Baseline:  Goal status: INITIAL acieved 7/18    LONG TERM GOALS: Target date: 03/19/2023    Patient report no pain at the end of the day after performing activities Baseline:  Goal status: INITIAL  2.  Patient will go up and down 8 steps with a reciprocal gait pattern without increased pain Baseline:  Goal status: INITIAL  3.  Patient will ambulate community distances without increased pain Baseline:  Goal status: INITIAL    PLAN:  PT FREQUENCY: 2x/week  PT DURATION: 8 weeks  PLANNED INTERVENTIONS: Therapeutic exercises, Therapeutic activity, Neuromuscular re-education, Balance training, Gait training, Patient/Family education, Self Care, Joint mobilization, Stair training, DME instructions, Aquatic Therapy, Cryotherapy, Moist heat, Ultrasound, Ionotophoresis 4mg /ml Dexamethasone, and Manual therapy  PLAN FOR NEXT SESSION:   Review HEP, keep home program short but make sure we are loading properly. She was advised to make an exercise routine. Work on balance exercises as well and dynamic gait activity   Referring diagnosis? M62.81 (ICD-10-CM) - Muscle weakness (generalized) Treatment diagnosis? (if different than referring diagnosis)  What was this (referring dx) caused by? [x]  Surgery []  Fall []  Ongoing issue []  Arthritis []  Other: ____________  Laterality: []  Rt [x]  Lt []  Both  Check all possible CPT codes:  *CHOOSE 10 OR LESS*    []  97110 (Therapeutic Exercise)  []  92507 (SLP Treatment)  []  97112 (Neuro Re-ed)   []  92526 (Swallowing Treatment)   []  97116 (Gait Training)   []  [id number] (Cognitive Training, 1st 15 minutes) []  97140 (Manual Therapy)   []  97130 (Cognitive Training, each add'l 15  minutes)  []  97164 (Re-evaluation)                              []  Other, List CPT Code ____________  []  97530 (Therapeutic Activities)     []  97535 (Self Care)   [x]  All codes above (97110 - 97535)  []  97012 (Mechanical Traction)  []  97014 (E-stim Unattended)  []  97032 (E-stim manual)  []  97033 (Ionto)  []  97035 (Ultrasound) []  97750 (Physical Performance Training) []  [id number] (Aquatic Therapy) []  97016 (Vasopneumatic Device) []  [id number] (Paraffin) []  97034 (Contrast Bath) []  97597 (Wound Care 1st 20 sq cm) []  97598 (Wound Care each add'l 20 sq cm) []  97760 (Orthotic Fabrication, Fitting, Training Initial) []  [id number] (Prosthetic Management and Training Initial) []  [id number] (Orthotic or Prosthetic Training/ Modification Subsequent)   Dessie Coma, PT 03/04/2023, 12:13 PM

## 2023-03-05 ENCOUNTER — Encounter (HOSPITAL_BASED_OUTPATIENT_CLINIC_OR_DEPARTMENT_OTHER): Payer: Self-pay | Admitting: Physical Therapy

## 2023-03-12 DIAGNOSIS — R609 Edema, unspecified: Secondary | ICD-10-CM | POA: Diagnosis not present

## 2023-03-12 DIAGNOSIS — I10 Essential (primary) hypertension: Secondary | ICD-10-CM | POA: Diagnosis not present

## 2023-06-28 ENCOUNTER — Other Ambulatory Visit: Payer: Self-pay | Admitting: Cardiology

## 2023-07-21 NOTE — Progress Notes (Deleted)
  Cardiology Office Note:   Date:  07/21/2023  ID:  Mallie Darting, DOB 06/05/1941, MRN [id number] PCP: Daisy Floro, MD   [facility] Providers Cardiologist:  Rollene Rotunda, MD {  History of Present Illness:   Terri Ortega is a 82 y.o. female who presents for follow up of a mildly reduced ejection fraction.  This was noted 7 years ago.  Her EF was found to be 45% on an echocardiogram.  However, subsequent follow-up demonstrated to be normalized to 65%.  At the last visit she was having dizziness.  She has seen neurology.  On CT and she has some small vessel disease.  She had a monitor without arrhythmia.      Since I last saw her she was seen on our clinic with increased edema and had an increased dose of Lasix for a few days. ***   ***  she had a fall.  She broke her left hip and had to have surgical repair.  She has not had any new cardiac problems however.  The patient denies any new symptoms such as chest discomfort, neck or arm discomfort. There has been no new shortness of breath, PND or orthopnea. There have been no reported palpitations, presyncope or syncope.    She gets around slowly with a cane.      ROS: ***  Studies Reviewed:    EKG:       ***  Risk Assessment/Calculations:   {Does this patient have ATRIAL FIBRILLATION?:[id number]} No BP recorded.  {Refresh Note OR Click here to enter BP  :1}***        Physical Exam:   VS:  There were no vitals taken for this visit.   Wt Readings from Last 3 Encounters:  02/22/23 184 lb (83.5 kg)  10/07/22 173 lb 6.4 oz (78.7 kg)  07/21/22 176 lb 9.6 oz (80.1 kg)     GEN: Well nourished, well developed in no acute distress NECK: No JVD; No carotid bruits CARDIAC: ***RR, *** murmurs, rubs, gallops RESPIRATORY:  Clear to auscultation without rales, wheezing or rhonchi  ABDOMEN: Soft, non-tender, non-distended EXTREMITIES:  No edema; No deformity   ASSESSMENT AND PLAN:   CARDIOMYOPATHY:   ***  I do  not suspect heart failure.  Her exam is unremarkable.  She is not having any shortness of breath.   EDEMA:  *** I suspect this is venous insufficiency related to her injury and her decreased mobility.  I am to give her Lasix 20 mg just for 3 days.  We talked about salt restriction and fluid restriction.  I outlined how to keep her feet elevated.  She should wear compression stockings.  I will check a BNP level though again I do not strongly suspect heart failure.   HTN:  The blood pressure is *** at target although upper limits.  She can keep an eye on this.  It has been labile.    BRADYCARDIA:   ***  She tolerates this.  No change in therapy.     Follow up ***  Signed, Rollene Rotunda, MD

## 2023-07-22 ENCOUNTER — Ambulatory Visit: Payer: Medicare PPO | Attending: Cardiology | Admitting: Cardiology

## 2023-07-22 DIAGNOSIS — I429 Cardiomyopathy, unspecified: Secondary | ICD-10-CM

## 2023-07-22 DIAGNOSIS — I10 Essential (primary) hypertension: Secondary | ICD-10-CM

## 2023-07-22 DIAGNOSIS — R001 Bradycardia, unspecified: Secondary | ICD-10-CM

## 2023-09-02 DIAGNOSIS — E039 Hypothyroidism, unspecified: Secondary | ICD-10-CM | POA: Diagnosis not present

## 2023-09-02 DIAGNOSIS — R600 Localized edema: Secondary | ICD-10-CM | POA: Diagnosis not present

## 2023-09-02 DIAGNOSIS — G3184 Mild cognitive impairment, so stated: Secondary | ICD-10-CM | POA: Diagnosis not present

## 2023-09-02 DIAGNOSIS — I10 Essential (primary) hypertension: Secondary | ICD-10-CM | POA: Diagnosis not present

## 2023-09-15 DIAGNOSIS — I8311 Varicose veins of right lower extremity with inflammation: Secondary | ICD-10-CM | POA: Diagnosis not present

## 2023-09-15 DIAGNOSIS — L821 Other seborrheic keratosis: Secondary | ICD-10-CM | POA: Diagnosis not present

## 2023-09-15 DIAGNOSIS — I872 Venous insufficiency (chronic) (peripheral): Secondary | ICD-10-CM | POA: Diagnosis not present

## 2023-09-15 DIAGNOSIS — L218 Other seborrheic dermatitis: Secondary | ICD-10-CM | POA: Diagnosis not present

## 2023-09-15 DIAGNOSIS — Z85828 Personal history of other malignant neoplasm of skin: Secondary | ICD-10-CM | POA: Diagnosis not present

## 2023-09-15 DIAGNOSIS — L308 Other specified dermatitis: Secondary | ICD-10-CM | POA: Diagnosis not present

## 2023-09-15 DIAGNOSIS — L812 Freckles: Secondary | ICD-10-CM | POA: Diagnosis not present

## 2023-09-15 DIAGNOSIS — I8312 Varicose veins of left lower extremity with inflammation: Secondary | ICD-10-CM | POA: Diagnosis not present

## 2023-11-30 DIAGNOSIS — G3184 Mild cognitive impairment, so stated: Secondary | ICD-10-CM | POA: Diagnosis not present

## 2023-11-30 DIAGNOSIS — I10 Essential (primary) hypertension: Secondary | ICD-10-CM | POA: Diagnosis not present

## 2023-11-30 DIAGNOSIS — E039 Hypothyroidism, unspecified: Secondary | ICD-10-CM | POA: Diagnosis not present

## 2023-11-30 DIAGNOSIS — M810 Age-related osteoporosis without current pathological fracture: Secondary | ICD-10-CM | POA: Diagnosis not present

## 2024-01-03 DIAGNOSIS — M818 Other osteoporosis without current pathological fracture: Secondary | ICD-10-CM | POA: Diagnosis not present

## 2024-01-12 DIAGNOSIS — M7989 Other specified soft tissue disorders: Secondary | ICD-10-CM | POA: Insufficient documentation

## 2024-01-12 NOTE — Progress Notes (Signed)
 Cardiology Office Note:   Date:  01/13/2024  ID:  Terri Ortega, DOB 13-Dec-1940, MRN [id number] PCP: Jimmey Mould, MD  [facility] Providers Cardiologist:  Eilleen Grates, MD {  History of Present Illness:   Terri Ortega is a 83 y.o. female who presents for follow up of a mildly reduced ejection fraction.  This was noted 7 years ago.  Her EF was found to be 45% on an echocardiogram.  However, subsequent follow-up demonstrated to be normalized to 65% with the latest ejection fraction being 55 to 60% on echo in 2017.  At the last visit she was having dizziness.  She has seen neurology.  On CT and she has some small vessel disease.  She had a monitor without arrhythmia.      She was last seen in the office in July 2024.  She had some lower extremity swelling and is again treated with diuretics.  She walks slowly with a cane and per her daughter is very sedentary.  She does get dyspnea with exertion.  In fact walking around the office it looks like her oxygen fell when she was walking.  She is not describing PND or orthopnea.  He is not describing palpitations, presyncope or syncope.  She has had increased lower extremity swelling but she does not want to take her Lasix  which has been prescribed as needed.  She does not particularly avoid salt.  ROS: As stated in the HPI and negative for all other systems.  Studies Reviewed:    EKG:   EKG Interpretation Date/Time:  Thursday January 13 2024 09:11:36 EDT Ventricular Rate:  59 PR Interval:  194 QRS Duration:  116 QT Interval:  456 QTC Calculation: 451 R Axis:   -41  Text Interpretation: Sinus bradycardia with sinus arrhythmia Left axis deviation Left ventricular hypertrophy with QRS widening and repolarization abnormality When compared with ECG of 15-Apr-2022 19:52, No significant change since last tracing artifact is no longer present Confirmed by Eilleen Grates ([id number]) on 01/13/2024 9:15:08 AM     Risk  Assessment/Calculations:       Physical Exam:   VS:  BP (!) 140/72 (BP Location: Left Arm, Patient Position: Sitting, Cuff Size: Large)   Pulse 61   Ht 5' 5 (1.651 m)   Wt 188 lb (85.3 kg)   SpO2 94%   BMI 31.28 kg/m    Wt Readings from Last 3 Encounters:  01/13/24 188 lb (85.3 kg)  02/22/23 184 lb (83.5 kg)  10/07/22 173 lb 6.4 oz (78.7 kg)     GEN: Well nourished, well developed in no acute distress NECK: No JVD; No carotid bruits CARDIAC: RRR, no murmurs, rubs, gallops RESPIRATORY:  Clear to auscultation without rales, wheezing or rhonchi  ABDOMEN: Soft, non-tender, non-distended EXTREMITIES:  Mild leg edema; No deformity   ASSESSMENT AND PLAN:   CARDIOMYOPATHY:   I am going to start with a BNP level.  If this is elevated she will need an echocardiogram.  I not strongly suspecting heart failure as etiology of her dyspnea but rather suspect that she might have some deconditioning.  If the BNP is normal I will probably follow-up with pulmonary function testing.  EDEMA:   She needs to wear compression stockings and we talked about this.  She needs to take her Lasix  probably for the next 3 days 20 mg.   She needs to avoid salt and keep her feet elevated.  I will do the other workup as above.  HTN:  The blood pressure is upper limits here and she says might be higher at home.  I did ask her to keep a blood pressure diary.   AORTIC VALVE DISEASE: I do not hear a murmur at this time and will evaluate as above.    BRADYCARDIA:   I will have her wear a 3-day monitor to make sure she has chronotropic competence.  She has had a lower heart rate in the past.  I do not however think this is the etiology of her dyspnea.   DYSPNEA: As above    Follow up with APP in 4 months.   Signed, Eilleen Grates, MD

## 2024-01-13 ENCOUNTER — Encounter: Payer: Self-pay | Admitting: Cardiology

## 2024-01-13 ENCOUNTER — Ambulatory Visit: Attending: Cardiology | Admitting: Cardiology

## 2024-01-13 ENCOUNTER — Ambulatory Visit

## 2024-01-13 VITALS — BP 140/72 | HR 61 | Ht 65.0 in | Wt 188.0 lb

## 2024-01-13 DIAGNOSIS — R001 Bradycardia, unspecified: Secondary | ICD-10-CM

## 2024-01-13 DIAGNOSIS — I10 Essential (primary) hypertension: Secondary | ICD-10-CM | POA: Diagnosis not present

## 2024-01-13 DIAGNOSIS — M7989 Other specified soft tissue disorders: Secondary | ICD-10-CM | POA: Diagnosis not present

## 2024-01-13 NOTE — Progress Notes (Unsigned)
Applied a 3 day Zio XT monitor to patient in the office 

## 2024-01-13 NOTE — Patient Instructions (Addendum)
 Medication Instructions:  Your physician recommends that you continue on your current medications as directed. Please refer to the Current Medication list given to you today.  *If you need a refill on your cardiac medications before your next appointment, please call your pharmacy*  Lab Work: BNP today If you have labs (blood work) drawn today and your tests are completely normal, you will receive your results only by: MyChart Message (if you have MyChart) OR A paper copy in the mail If you have any lab test that is abnormal or we need to change your treatment, we will call you to review the results.  Testing/Procedures: 3 Day Zio Heart Monitor Your physician has requested that you wear a Zio heart monitor for 3 days. This will be mailed to your home with instructions on how to apply the monitor and how to return it when finished. Please allow 2 weeks after returning the heart monitor before our office calls you with the results.   Follow-Up: At [facility], you and your health needs are our priority.  As part of our continuing mission to provide you with exceptional heart care, our providers are all part of one team.  This team includes your primary Cardiologist (physician) and Advanced Practice Providers or APPs (Physician Assistants and Nurse Practitioners) who all work together to provide you with the care you need, when you need it.  Your next appointment:   3 months  Provider:   APP  We recommend signing up for the patient portal called MyChart.  Sign up information is provided on this After Visit Summary.  MyChart is used to connect with patients for Virtual Visits (Telemedicine).  Patients are able to view lab/test results, encounter notes, upcoming appointments, etc.  Non-urgent messages can be sent to your provider as well.   To learn more about what you can do with MyChart, go to [email or web address].   Other Instructions Blood pressure diary: Take your blood  pressure twice daily for 10 days and send us  the readings on MyChart.    ZIO XT- Long Term Monitor Instructions  Your physician has requested you wear a ZIO patch monitor for 14 days.  This is a single patch monitor. [employer] supplies one patch monitor per enrollment. Additional stickers are not available. Please do not apply patch if you will be having a Nuclear Stress Test,  Echocardiogram, Cardiac CT, MRI, or Chest Xray during the period you would be wearing the  monitor. The patch cannot be worn during these tests. You cannot remove and re-apply the  ZIO XT patch monitor.  Your ZIO patch monitor will be mailed 3 day USPS to your address on file. It may take 3-5 days  to receive your monitor after you have been enrolled.  Once you have received your monitor, please review the enclosed instructions. Your monitor  has already been registered assigning a specific monitor serial # to you.  Billing and Patient Assistance Program Information  We have supplied [employer] with any of your insurance information on file for billing purposes. [employer] offers a sliding scale Patient Assistance Program for patients that do not have  insurance, or whose insurance does not completely cover the cost of the ZIO monitor.  You must apply for the Patient Assistance Program to qualify for this discounted rate.  To apply, please call [employer] at [phone], select option 4, select option 2, ask to apply for  Patient Assistance Program. Sanna Crystal will ask your household income, and how many people  are in your household. They will quote your out-of-pocket cost based on that information.  [employer] will also be able to set up a 41-month, interest-free payment plan if needed.  Applying the monitor   Shave hair from upper left chest.  Hold abrader disc by orange tab. Rub abrader in 40 strokes over the upper left chest as  indicated in your monitor instructions.  Clean area with 4 enclosed alcohol pads. Let dry.   Apply patch as indicated in monitor instructions. Patch will be placed under collarbone on left  side of chest with arrow pointing upward.  Rub patch adhesive wings for 2 minutes. Remove white label marked 1. Remove the white  label marked 2. Rub patch adhesive wings for 2 additional minutes.  While looking in a mirror, press and release button in center of patch. A small green light will  flash 3-4 times. This will be your only indicator that the monitor has been turned on.  Do not shower for the first 24 hours. You may shower after the first 24 hours.  Press the button if you feel a symptom. You will hear a small click. Record Date, Time and  Symptom in the Patient Logbook.  When you are ready to remove the patch, follow instructions on the last 2 pages of Patient  Logbook. Stick patch monitor onto the last page of Patient Logbook.  Place Patient Logbook in the blue and white box. Use locking tab on box and tape box closed  securely. The blue and white box has prepaid postage on it. Please place it in the mailbox as  soon as possible. Your physician should have your test results approximately 7 days after the  monitor has been mailed back to [facility].  Call [facility] Customer Care at [phone] if you have questions regarding  your ZIO XT patch monitor. Call them immediately if you see an orange light blinking on your  monitor.  If your monitor falls off in less than 4 days, contact our Monitor department at [phone].  If your monitor becomes loose or falls off after 4 days call [employer] at [phone] for  suggestions on securing your monitor

## 2024-01-14 ENCOUNTER — Ambulatory Visit: Payer: Self-pay | Admitting: Cardiology

## 2024-01-14 DIAGNOSIS — R0602 Shortness of breath: Secondary | ICD-10-CM

## 2024-01-14 DIAGNOSIS — R609 Edema, unspecified: Secondary | ICD-10-CM

## 2024-01-14 LAB — PRO B NATRIURETIC PEPTIDE: NT-Pro BNP: 628 pg/mL (ref 0–738)

## 2024-01-21 DIAGNOSIS — R001 Bradycardia, unspecified: Secondary | ICD-10-CM | POA: Diagnosis not present

## 2024-01-25 ENCOUNTER — Other Ambulatory Visit: Payer: Self-pay | Admitting: Cardiology

## 2024-01-26 ENCOUNTER — Telehealth: Payer: Self-pay | Admitting: Cardiology

## 2024-01-26 MED ORDER — FUROSEMIDE 20 MG PO TABS: 20.0000 mg | ORAL_TABLET | Freq: Every day | ORAL | 3 refills | Status: DC | PRN

## 2024-01-26 NOTE — Telephone Encounter (Signed)
 RX sent to requested Pharmacy

## 2024-01-26 NOTE — Telephone Encounter (Signed)
°*  STAT* If patient is at the pharmacy, call can be transferred to refill team.   1. Which medications need to be refilled? (please list name of each medication and dose if known) furosemide (LASIX) 20 MG tablet  2. Which pharmacy/location (including street and city if local pharmacy) is medication to be sent to? [place], [place] - [id number]  3. Do they need a 30 day or 90 day supply? [place]

## 2024-01-28 DIAGNOSIS — R001 Bradycardia, unspecified: Secondary | ICD-10-CM

## 2024-02-07 NOTE — Telephone Encounter (Signed)
 Letter with results sent 7/7. Echocardiogram ordered. Pt will receive call to schedule.

## 2024-02-15 ENCOUNTER — Encounter (HOSPITAL_COMMUNITY): Payer: Self-pay | Admitting: Cardiology

## 2024-02-29 DIAGNOSIS — N3946 Mixed incontinence: Secondary | ICD-10-CM | POA: Diagnosis not present

## 2024-02-29 DIAGNOSIS — E039 Hypothyroidism, unspecified: Secondary | ICD-10-CM | POA: Diagnosis not present

## 2024-02-29 DIAGNOSIS — I10 Essential (primary) hypertension: Secondary | ICD-10-CM | POA: Diagnosis not present

## 2024-02-29 DIAGNOSIS — Z0000 Encounter for general adult medical examination without abnormal findings: Secondary | ICD-10-CM | POA: Diagnosis not present

## 2024-02-29 DIAGNOSIS — R600 Localized edema: Secondary | ICD-10-CM | POA: Diagnosis not present

## 2024-03-11 ENCOUNTER — Emergency Department (HOSPITAL_BASED_OUTPATIENT_CLINIC_OR_DEPARTMENT_OTHER)

## 2024-03-11 ENCOUNTER — Emergency Department (HOSPITAL_BASED_OUTPATIENT_CLINIC_OR_DEPARTMENT_OTHER)
Admission: EM | Admit: 2024-03-11 | Discharge: 2024-03-11 | Disposition: A | Discharge status: Home / Self Care | Attending: Emergency Medicine | Admitting: Emergency Medicine

## 2024-03-11 ENCOUNTER — Encounter (HOSPITAL_BASED_OUTPATIENT_CLINIC_OR_DEPARTMENT_OTHER): Payer: Self-pay

## 2024-03-11 ENCOUNTER — Other Ambulatory Visit: Payer: Self-pay

## 2024-03-11 DIAGNOSIS — E039 Hypothyroidism, unspecified: Secondary | ICD-10-CM | POA: Insufficient documentation

## 2024-03-11 DIAGNOSIS — W19XXXA Unspecified fall, initial encounter: Secondary | ICD-10-CM

## 2024-03-11 DIAGNOSIS — W182XXA Fall in (into) shower or empty bathtub, initial encounter: Secondary | ICD-10-CM | POA: Insufficient documentation

## 2024-03-11 DIAGNOSIS — F0390 Unspecified dementia without behavioral disturbance: Secondary | ICD-10-CM | POA: Diagnosis not present

## 2024-03-11 DIAGNOSIS — Z96642 Presence of left artificial hip joint: Secondary | ICD-10-CM | POA: Diagnosis not present

## 2024-03-11 DIAGNOSIS — S0990XA Unspecified injury of head, initial encounter: Secondary | ICD-10-CM | POA: Diagnosis not present

## 2024-03-11 DIAGNOSIS — S0101XA Laceration without foreign body of scalp, initial encounter: Secondary | ICD-10-CM | POA: Insufficient documentation

## 2024-03-11 DIAGNOSIS — S0003XA Contusion of scalp, initial encounter: Secondary | ICD-10-CM | POA: Diagnosis not present

## 2024-03-11 DIAGNOSIS — R270 Ataxia, unspecified: Secondary | ICD-10-CM | POA: Diagnosis not present

## 2024-03-11 DIAGNOSIS — I10 Essential (primary) hypertension: Secondary | ICD-10-CM | POA: Diagnosis not present

## 2024-03-11 DIAGNOSIS — I5030 Unspecified diastolic (congestive) heart failure: Secondary | ICD-10-CM | POA: Insufficient documentation

## 2024-03-11 DIAGNOSIS — Z8542 Personal history of malignant neoplasm of other parts of uterus: Secondary | ICD-10-CM | POA: Insufficient documentation

## 2024-03-11 DIAGNOSIS — I130 Hypertensive heart and chronic kidney disease with heart failure and stage 1 through stage 4 chronic kidney disease, or unspecified chronic kidney disease: Secondary | ICD-10-CM | POA: Diagnosis not present

## 2024-03-11 DIAGNOSIS — Z79899 Other long term (current) drug therapy: Secondary | ICD-10-CM | POA: Insufficient documentation

## 2024-03-11 DIAGNOSIS — Z96651 Presence of right artificial knee joint: Secondary | ICD-10-CM | POA: Diagnosis not present

## 2024-03-11 DIAGNOSIS — N183 Chronic kidney disease, stage 3 unspecified: Secondary | ICD-10-CM | POA: Insufficient documentation

## 2024-03-11 DIAGNOSIS — S199XXA Unspecified injury of neck, initial encounter: Secondary | ICD-10-CM | POA: Diagnosis not present

## 2024-03-11 DIAGNOSIS — S098XXA Other specified injuries of head, initial encounter: Secondary | ICD-10-CM

## 2024-03-11 LAB — BASIC METABOLIC PANEL WITH GFR
Anion gap: 14 (ref 5–15)
BUN: 25 mg/dL — ABNORMAL HIGH (ref 8–23)
CO2: 26 mmol/L (ref 22–32)
Calcium: 10.3 mg/dL (ref 8.9–10.3)
Chloride: 102 mmol/L (ref 98–111)
Creatinine, Ser: 1.11 mg/dL — ABNORMAL HIGH (ref 0.44–1.00)
GFR, Estimated: 49 mL/min — ABNORMAL LOW (ref >60)
Glucose, Bld: 114 mg/dL — ABNORMAL HIGH (ref 70–99)
Potassium: 3.9 mmol/L (ref 3.5–5.1)
Sodium: 142 mmol/L (ref 135–145)

## 2024-03-11 LAB — CBC WITH DIFFERENTIAL/PLATELET
Abs Immature Granulocytes: 0.16 K/uL — ABNORMAL HIGH (ref 0.00–0.07)
Basophils Absolute: 0.1 K/uL (ref 0.0–0.1)
Basophils Relative: 0 %
Eosinophils Absolute: 0.2 K/uL (ref 0.0–0.5)
Eosinophils Relative: 1 %
HCT: 45.4 % (ref 36.0–46.0)
Hemoglobin: 15 g/dL (ref 12.0–15.0)
Immature Granulocytes: 1 %
Lymphocytes Relative: 11 %
Lymphs Abs: 1.5 K/uL (ref 0.7–4.0)
MCH: 30.9 pg (ref 26.0–34.0)
MCHC: 33 g/dL (ref 30.0–36.0)
MCV: 93.4 fL (ref 80.0–100.0)
Monocytes Absolute: 0.8 K/uL (ref 0.1–1.0)
Monocytes Relative: 6 %
Neutro Abs: 11 K/uL — ABNORMAL HIGH (ref 1.7–7.7)
Neutrophils Relative %: 81 %
Platelets: 221 K/uL (ref 150–400)
RBC: 4.86 MIL/uL (ref 3.87–5.11)
RDW: 12.7 % (ref 11.5–15.5)
WBC: 13.6 K/uL — ABNORMAL HIGH (ref 4.0–10.5)
nRBC: 0 % (ref 0.0–0.2)

## 2024-03-11 MED ORDER — LIDOCAINE-EPINEPHRINE (PF) 2 %-1:200000 IJ SOLN
20.0000 mL | Freq: Once | INTRAMUSCULAR | Status: DC
  Filled 2024-03-11: qty 20

## 2024-03-11 MED ORDER — CEPHALEXIN 500 MG PO CAPS: 500.0000 mg | ORAL_CAPSULE | Freq: Two times a day (BID) | ORAL | 0 refills | Status: AC

## 2024-03-11 MED ORDER — CEPHALEXIN 250 MG PO CAPS
500.0000 mg | ORAL_CAPSULE | Freq: Once | ORAL | Status: AC
  Administered 2024-03-11: 500 mg via ORAL
  Filled 2024-03-11: qty 2

## 2024-03-11 MED ORDER — ONDANSETRON 4 MG PO TBDP
4.0000 mg | ORAL_TABLET | Freq: Once | ORAL | Status: AC
  Administered 2024-03-11: 4 mg via ORAL
  Filled 2024-03-11: qty 1

## 2024-03-11 NOTE — ED Notes (Signed)
 Patient transported to CT

## 2024-03-11 NOTE — ED Notes (Signed)
 Pt returned to room from CT w this RN; denies HA, NV, blurred vision. Given warm blankets, hooked back to monitor, call light within reach

## 2024-03-11 NOTE — ED Provider Notes (Signed)
 [place] EMERGENCY DEPARTMENT AT [facility] Provider Note  CSN: [id number] Arrival date & time: 03/11/24 1951  Chief Complaint(s) Fall and Head Injury  HPI Terri Ortega is a 83 y.o. female with history of mild dementia who is here today after she slipped in the shower and struck the back of her head.  Patient was down on the ground for about 30 minutes before her husband found her.  He called family who helped get her up, brought her to the ED.  She is not on any blood thinners.  She denies LOC.   Past Medical History Past Medical History:  Diagnosis Date   Graves disease    HTN (hypertension)    Low back pain    Dr. Joshua   Osteoporosis    Pneumonia 2012   Radiation 03/20/14, 03/27/14, 04/05/14, 04/12/14, 04/17/14   bracytherapy to proximal vagina 30 gray   Scoliosis    Uterine cancer (HCC)    surgery and radiation x 5 treatments-last 9'15   Vitamin D  deficiency    Patient Active Problem List   Diagnosis Date Noted   Leg swelling 01/12/2024   Nausea 04/27/2022   Blood loss anemia 04/24/2022   Slow transit constipation 04/24/2022   Subcapital fracture of femur, left, closed, initial encounter (HCC) 04/15/2022   Fall at home, initial encounter 04/15/2022   CKD (chronic kidney disease) stage 3, GFR 30-59 ml/min (HCC) 04/15/2022   Hypertensive urgency 04/15/2022   Osteoporosis 04/15/2022   Aortic valve disease 07/10/2021   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   Orthostatic dizziness 11/14/2020   Educated about COVID-19 virus infection 12/21/2019   Mild cognitive impairment 09/06/2019   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   Preop cardiovascular exam 12/20/2018   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   Uterine cancer (HCC) 03/28/2018   Arthralgia of hip or thigh 03/28/2018   Benign essential HTN 03/28/2018   Overweight  03/28/2018   Heart disease 11/09/2014   Leukocytosis 10/31/2014   Diastolic congestive heart failure (HCC) 10/30/2014   Hypothyroidism 10/30/2014   Chest pain at rest 10/30/2014   Chest pain 10/30/2014   Endometrial cancer (HCC) 01/30/2014   Dyspnea 01/11/2014   Edema 01/11/2014   Low back pain with right-sided sciatica 12/13/2013   Degeneration of lumbar or lumbosacral intervertebral disc 12/13/2013   Sciatica 12/13/2013   Home Medication(s) Prior to Admission medications   Medication Sig Start Date End Date Taking? Authorizing Provider  cephALEXin  (KEFLEX ) 500 MG capsule Take 1 capsule (500 mg total) by mouth 2 (two) times daily for 3 days. 03/11/24 03/14/24 Yes Mannie Pac T, DO  amLODipine  (NORVASC ) 2.5 MG tablet Take 1 tablet (2.5 mg total) by mouth daily. 06/28/23   Lavona Agent, MD  Cholecalciferol  (VITAMIN D3 PO) Take 2,000 Int'l Units by mouth daily.    [provider]  donepezil  (ARICEPT ) 10 MG tablet Take 1 tablet (10 mg total) by mouth at bedtime. 10/07/22   Ines Onetha NOVAK, MD  furosemide  (LASIX ) 20 MG tablet Take 1 tablet (20 mg total) by mouth daily as needed for fluid or edema. 01/26/24   Lavona Agent, MD  ibandronate (BONIVA) 150 MG tablet Take 150 mg by mouth every 30 (thirty) days. Pt takes on the 25th of each month (Is on hold) Patient not taking: Reported on 01/13/2024    [provider]  levothyroxine  (SYNTHROID ) 137 MCG tablet Take 137 mcg by  mouth daily before breakfast.    [provider]  traMADol  (ULTRAM ) 50 MG tablet Take 1 tablet (50 mg total) by mouth 2 (two) times daily as needed. 05/15/22   Mast, Man X, NP                                                                                                                                    Past Surgical History Past Surgical History:  Procedure Laterality Date   BUNIONECTOMY     left   CATARACT EXTRACTION, BILATERAL Bilateral    last done 11-21-14   COLONOSCOPY WITH PROPOFOL   N/A 12/25/2014   Procedure: COLONOSCOPY WITH PROPOFOL ;  Surgeon: Gladis MARLA Louder, MD;  Location: [facility];  Service: Endoscopy;  Laterality: N/A;   FOOT SURGERY Left 2004   Dr Eben   JOINT REPLACEMENT     RTKA   ROBOTIC ASSISTED TOTAL HYSTERECTOMY WITH BILATERAL SALPINGO OOPHERECTOMY  01/30/14   with lymph node biopsy   TOTAL HIP ARTHROPLASTY Left 04/17/2022   Procedure: TOTAL HIP ARTHROPLASTY ANTERIOR APPROACH;  Surgeon: Fidel Rogue, MD;  Location: [facility] OR;  Service: Orthopedics;  Laterality: Left;   TOTAL KNEE ARTHROPLASTY     right   TOTAL KNEE REVISION Right 01/04/2019   Procedure: TOTAL KNEE REVISION;  Surgeon: Melodi Lerner, MD;  Location: [facility] ORS;  Service: Orthopedics;  Laterality: Right;  with block   Family History Family History  Adopted: Yes  Problem Relation Age of Onset   Heart disease Mother 19       No details   Memory loss Neg Hx    Dementia Neg Hx     Social History Social History   Tobacco Use   Smoking status: Never   Smokeless tobacco: Never  Vaping Use   Vaping status: Never Used  Substance Use Topics   Alcohol use: Never   Drug use: Never   Allergies Percocet [oxycodone -acetaminophen ]  Review of Systems Review of Systems  Physical Exam Vital Signs  I have reviewed the triage vital signs BP (!) 174/103 (BP Location: Right Arm)   Pulse 87   Temp 98 F (36.7 C) (Oral)   Resp (!) 22   Ht 5' 5 (1.651 m)   Wt 85.3 kg   SpO2 96%   BMI 31.29 kg/m   Physical Exam Vitals and nursing note reviewed.  HENT:     Head: Normocephalic.     Comments: Midline occiput there is a 7 cm laceration, hematoma present. Eyes:     Pupils: Pupils are equal, round, and reactive to light.  Musculoskeletal:     Cervical back: Normal range of motion.     Comments: No tenderness to palpation in the bilateral shoulders, upper arms, elbows, forearms or wrists.  No tenderness to palpation in the chest.  Pelvis stable, nontender.  No tenderness,  deformities noted on bilateral upper legs, knees, lower legs or ankles.  Patient able  to lift both legs from the bed.  Neurological:     General: No focal deficit present.     Mental Status: She is alert.     Cranial Nerves: No cranial nerve deficit.     Motor: No weakness.     ED Results and Treatments Labs (all labs ordered are listed, but only abnormal results are displayed) Labs Reviewed  CBC WITH DIFFERENTIAL/PLATELET - Abnormal; Notable for the following components:      Result Value   WBC 13.6 (*)    Neutro Abs 11.0 (*)    Abs Immature Granulocytes 0.16 (*)    All other components within normal limits  BASIC METABOLIC PANEL WITH GFR - Abnormal; Notable for the following components:   Glucose, Bld 114 (*)    BUN 25 (*)    Creatinine, Ser 1.11 (*)    GFR, Estimated 49 (*)    All other components within normal limits                                                                                                                          Radiology CT Head Wo Contrast Result Date: 03/11/2024 CLINICAL DATA:  Ataxia, cervical trauma; Ataxia, head trauma. Fall, head injury EXAM: CT HEAD WITHOUT CONTRAST CT CERVICAL SPINE WITHOUT CONTRAST TECHNIQUE: Multidetector CT imaging of the head and cervical spine was performed following the standard protocol without intravenous contrast. Multiplanar CT image reconstructions of the cervical spine were also generated. RADIATION DOSE REDUCTION: This exam was performed according to the departmental dose-optimization program which includes automated exposure control, adjustment of the mA and/or kV according to patient size and/or use of iterative reconstruction technique. COMPARISON:  None Available. FINDINGS: CT HEAD FINDINGS Brain: Normal anatomic configuration. Parenchymal volume loss is commensurate with the patient's age. Mild periventricular white matter changes are present likely reflecting the sequela of small vessel ischemia. No abnormal intra or  extra-axial mass lesion or fluid collection. No abnormal mass effect or midline shift. No evidence of acute intracranial hemorrhage or infarct. Ventricular size is normal. Cerebellum unremarkable. Vascular: No asymmetric hyperdense vasculature at the skull base. Skull: Intact Sinuses/Orbits: Paranasal sinuses are clear. Orbits are unremarkable. Other: Mastoid air cells and middle ear cavities are clear. Large left occipital scalp hematoma with associated subcutaneous gas noted. CT CERVICAL SPINE FINDINGS Alignment: Normal. Skull base and vertebrae: Craniocervical alignment is normal. The atlantodental interval is not widened. No acute fracture of the cervical spine. Vertebral body height is preserved. Soft tissues and spinal canal: No prevertebral fluid or swelling. No visible canal hematoma. Disc levels: Intervertebral disc space narrowing and endplate remodeling is seen throughout the cervical spine in keeping with changes of advanced degenerative disc disease. Prevertebral soft tissues are not thickened on sagittal reformats. No high-grade canal stenosis. Multilevel uncovertebral arthrosis results in moderate to severe right neuroforaminal narrowing at C3-4, C4-5, and C5-6. Upper chest: Negative. Other: None IMPRESSION: 1. No acute intracranial abnormality. No calvarial fracture. Large left occipital scalp  hematoma. 2. No acute fracture or listhesis of the cervical spine. 3. 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 Electronically Signed   By: Dorethia Molt M.D.   On: 03/11/2024 21:07   CT Cervical Spine Wo Contrast Result Date: 03/11/2024 CLINICAL DATA:  Ataxia, cervical trauma; Ataxia, head trauma. Fall, head injury EXAM: CT HEAD WITHOUT CONTRAST CT CERVICAL SPINE WITHOUT CONTRAST TECHNIQUE: Multidetector CT imaging of the head and cervical spine was performed following the standard protocol without intravenous  contrast. Multiplanar CT image reconstructions of the cervical spine were also generated. RADIATION DOSE REDUCTION: This exam was performed according to the departmental dose-optimization program which includes automated exposure control, adjustment of the mA and/or kV according to patient size and/or use of iterative reconstruction technique. COMPARISON:  None Available. FINDINGS: CT HEAD FINDINGS Brain: Normal anatomic configuration. Parenchymal volume loss is commensurate with the patient's age. Mild periventricular white matter changes are present likely reflecting the sequela of small vessel ischemia. No abnormal intra or extra-axial mass lesion or fluid collection. No abnormal mass effect or midline shift. No evidence of acute intracranial hemorrhage or infarct. Ventricular size is normal. Cerebellum unremarkable. Vascular: No asymmetric hyperdense vasculature at the skull base. Skull: Intact Sinuses/Orbits: Paranasal sinuses are clear. Orbits are unremarkable. Other: Mastoid air cells and middle ear cavities are clear. Large left occipital scalp hematoma with associated subcutaneous gas noted. CT CERVICAL SPINE FINDINGS Alignment: Normal. Skull base and vertebrae: Craniocervical alignment is normal. The atlantodental interval is not widened. No acute fracture of the cervical spine. Vertebral body height is preserved. Soft tissues and spinal canal: No prevertebral fluid or swelling. No visible canal hematoma. Disc levels: Intervertebral disc space narrowing and endplate remodeling is seen throughout the cervical spine in keeping with changes of advanced degenerative disc disease. Prevertebral soft tissues are not thickened on sagittal reformats. No high-grade canal stenosis. Multilevel uncovertebral arthrosis results in moderate to severe right neuroforaminal narrowing at C3-4, C4-5, and C5-6. Upper chest: Negative. Other: None IMPRESSION: 1. No acute intracranial abnormality. No calvarial fracture. Large left  occipital scalp hematoma. 2. No acute fracture or listhesis of the cervical spine. 3. 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 Electronically Signed   By: Dorethia Molt M.D.   On: 03/11/2024 21:07    Pertinent labs & imaging results that were available during my care of the patient were reviewed by me and considered in my medical decision making (see MDM for details).  Medications Ordered in ED Medications  lidocaine -EPINEPHrine  (XYLOCAINE  W/EPI) 2 %-1:200000 (PF) injection 20 mL (has no administration in time range)  cephALEXin  (KEFLEX ) capsule 500 mg (has no administration in time range)  Procedures .Laceration Repair  Date/Time: 03/11/2024 10:11 PM  Performed by: Mannie Fairy DASEN, DO Authorized by: Mannie Fairy DASEN, DO   Consent:    Consent obtained:  Emergent situation   Risks discussed:  Infection and pain Universal protocol:    Patient identity confirmed:  Verbally with patient Anesthesia:    Anesthesia method:  Local infiltration   Local anesthetic:  Lidocaine  2% WITH epi Laceration details:    Location:  Scalp   Scalp location:  Occipital   Length (cm):  7 Exploration:    Hemostasis achieved with:  Epinephrine , tied off vessels and direct pressure Treatment:    Area cleansed with:  Saline and Shur-Clens   Amount of cleaning:  Extensive   Irrigation solution:  Sterile saline and sterile water    Irrigation volume:  2000   Debridement:  None Skin repair:    Repair method:  Sutures and staples   Suture size:  3-0   Wound skin closure material used: Ethilon.   Suture technique:  Simple interrupted   Number of sutures:  7   Number of staples:  7 Approximation:    Approximation:  Close Repair type:    Repair type:  Intermediate Post-procedure  details:    Dressing:  Non-adherent dressing   Procedure completion:  Tolerated   (including critical care time)  Medical Decision Making / ED Course   This patient presents to the ED for concern of fall, this involves an extensive number of treatment options, and is a complaint that carries with it a high risk of complications and morbidity.  The differential diagnosis includes head laceration, ICH, C-spine injury.  MDM: Patient with no neurological deficits.  On arrival to the ED, several bloodsoaked towels.  I was able to achieve modest hemostasis so we could get the patient over to head CT and ensure there is no intracranial hemorrhage.  Patient returned back from CT, new area began to bleed.  Completed repair.  Used combination of staples and sutures hemostasis achieved.  Basic blood work checked on the patient, some mild dehydration with slight increase in her renal function but otherwise normal.  Her head CT, C-spine films were negative.   Reassessment 10:15 PM-no continued bleeding.  Patient's hemoglobin greater than 14.  She is appropriate for discharge with PCP follow-up.  Return precautions discussed with family at bedside.  They are agreeable with this plan.  Will send with prophylactic antibiotics since we were more focused on hemostasis and there likely was some contamination.  Additional history obtained: -Additional history obtained from family at bedside -External records from outside source obtained and reviewed including: Chart review including previous notes, labs, imaging, consultation notes   Lab Tests: -I ordered, reviewed, and interpreted labs.   The pertinent results include:   Labs Reviewed  CBC WITH DIFFERENTIAL/PLATELET - Abnormal; Notable for the following components:      Result Value   WBC 13.6 (*)    Neutro Abs 11.0 (*)    Abs Immature Granulocytes 0.16 (*)    All other components within normal limits  BASIC METABOLIC PANEL WITH GFR - Abnormal; Notable  for the following components:   Glucose, Bld 114 (*)    BUN 25 (*)    Creatinine, Ser 1.11 (*)    GFR, Estimated 49 (*)    All other components within normal limits       Imaging Studies ordered: I ordered imaging studies including head CT, neck CT I independently visualized and interpreted imaging. I agree  with the radiologist interpretation   Medicines ordered and prescription drug management: Meds ordered this encounter  Medications   lidocaine -EPINEPHrine  (XYLOCAINE  W/EPI) 2 %-1:200000 (PF) injection 20 mL   cephALEXin  (KEFLEX ) capsule 500 mg   cephALEXin  (KEFLEX ) 500 MG capsule    Sig: Take 1 capsule (500 mg total) by mouth 2 (two) times daily for 3 days.    Dispense:  6 capsule    Refill:  0    -I have reviewed the patients home medicines and have made adjustments as needed    Cardiac Monitoring: The patient was maintained on a cardiac monitor.  I personally viewed and interpreted the cardiac monitored which showed an underlying rhythm of: Normal sinus rhythm  Social Determinants of Health:  Factors impacting patients care include: Medical comorbidities including dementia   Reevaluation: After the interventions noted above, I reevaluated the patient and found that they have :improved  Co morbidities that complicate the patient evaluation  Past Medical History:  Diagnosis Date   Graves disease    HTN (hypertension)    Low back pain    Dr. Joshua   Osteoporosis    Pneumonia 2012   Radiation 03/20/14, 03/27/14, 04/05/14, 04/12/14, 04/17/14   bracytherapy to proximal vagina 30 gray   Scoliosis    Uterine cancer (HCC)    surgery and radiation x 5 treatments-last 9'15   Vitamin D  deficiency       Dispostion: I considered admission for this patient, however with her reassuring imaging, she is appropriate for discharge.     Final Clinical Impression(s) / ED Diagnoses Final diagnoses:  None     @PCDICTATION @    Mannie Pac T, DO 03/11/24 2217

## 2024-03-11 NOTE — Discharge Instructions (Addendum)
 You had 7 sutures placed, and 7 staples placed.  The staples need to be removed in 2 weeks.  You can keep the sutures in until that time, or take them out in 1 week.  Have Rob take a look at the sutures in 1 week.  Please apologize to him for my messy repair.  You can take Keflex , 500 mg 2 times per day for the next 3 days to help prevent infection.

## 2024-03-11 NOTE — ED Triage Notes (Addendum)
 Arrives POV with complaints of sustaining a laceration to her head due to a mechanical fall in the shower. Patient slipped and hit her head. No LOC or blood thinners. Patient has a moderated amount of bleeding on arrival. Alert and oriented x4 on arrival.

## 2024-03-27 ENCOUNTER — Ambulatory Visit (HOSPITAL_COMMUNITY)
Admission: RE | Admit: 2024-03-27 | Discharge: 2024-03-27 | Disposition: A | Discharge status: Home / Self Care | Source: Ambulatory Visit | Attending: Cardiology | Admitting: Cardiology

## 2024-03-27 DIAGNOSIS — R600 Localized edema: Secondary | ICD-10-CM | POA: Diagnosis not present

## 2024-03-27 DIAGNOSIS — R0602 Shortness of breath: Secondary | ICD-10-CM | POA: Insufficient documentation

## 2024-03-27 DIAGNOSIS — R609 Edema, unspecified: Secondary | ICD-10-CM | POA: Insufficient documentation

## 2024-03-27 DIAGNOSIS — R079 Chest pain, unspecified: Secondary | ICD-10-CM

## 2024-03-27 LAB — ECHOCARDIOGRAM COMPLETE
Area-P 1/2: 4.26 cm2
S' Lateral: 3.2 cm

## 2024-04-02 ENCOUNTER — Ambulatory Visit: Payer: Self-pay | Admitting: Cardiology

## 2024-04-12 ENCOUNTER — Encounter: Payer: Self-pay | Admitting: *Deleted

## 2024-04-13 NOTE — Telephone Encounter (Signed)
 Pt returning call to go over test results. Please advise.

## 2024-04-13 NOTE — Progress Notes (Unsigned)
 OFFICE NOTE:    Date:  04/14/2024  ID:  Terri Ortega, DOB [birth year], MRN [id number] PCP: Okey Carlin Redbird, MD  [facility] Providers Cardiologist:  Lynwood Schilling, MD        HFimpEF (heart failure with improved ejection fraction)  EF previously 45% TTE 03/2024: EF 60-65  Aortic insufficiency  TTE 03/27/24: EF 60-65, no RWMA, mild LVH, NL RVSF, trivial MR, mild to mod AI, AV sclerosis  MPI 12/07/14: low risk  Hypertension Chronic kidney disease  Bradycardia  Monitor 01/2024: NSR, rare PVCs, PACs; no sustained arrhythmias  Graves Disease  Uterine CA         Discussed the use of AI scribe software for clinical note transcription with the patient, who gave verbal consent to proceed.  History of Present Illness Terri Ortega is a 83 y.o. female  She was last seen by Dr. Schilling 6/25 with symptoms of shortness of breath and lower extremity edema she was noted to be bradycardic.  BNP was normal.  Heart monitor demonstrated normal heart rate with activity and no significant pauses, high-grade heart block or atrial fibrillation.  She is here with her daughter.  Her leg swelling has improved since her last visit. She takes Lasix  approximately once a week, but not daily. No shortness of breath with minimal activity at home, such as moving from one end of the house to the other, and no chest discomfort or syncope. She is able to lay flat in bed without difficulty breathing. She has a history of knee replacement about four to five years ago and a hip replacement two years ago. She reports difficulty getting around and relies on assistance from her husband to get up from the couch. She fell three to four weeks ago, resulting in a large hematoma and requiring 15 stitches and staples. A head scan at the time showed no bleeding, and an EKG was normal. She does not smoke and denies any bleeding or black stools.     ROS-See HPI     Studies Reviewed:      Labs Chart  Review 03/11/24: K 3.9, SCr 1.11, hgb 15  HYPERTENSION CONTROL Vitals:   04/14/24 1042 04/14/24 1137  BP: (!) 156/71 (!) 144/76    The patient's blood pressure is elevated above target today.  In order to address the patient's elevated BP: Blood pressure will be monitored at home to determine if medication changes need to be made.         Physical Exam:  VS:  BP (!) 144/76   Pulse 64   Ht 5' 4 (1.626 m)   Wt 191 lb 6.4 oz (86.8 kg)   SpO2 94%   BMI 32.85 kg/m        Wt Readings from Last 3 Encounters:  04/14/24 191 lb 6.4 oz (86.8 kg)  03/11/24 188 lb 0.8 oz (85.3 kg)  01/13/24 188 lb (85.3 kg)    Constitutional:      Appearance: Healthy appearance. Not in distress.  Pulmonary:     Breath sounds: Normal breath sounds. No wheezing. No rales.  Cardiovascular:     Normal rate. Regular rhythm.     Murmurs: There is no murmur.  Edema:    Pretibial: bilateral trace edema of the pretibial area. Skin:    General: Skin is warm and dry.       Assessment and Plan:    Assessment & Plan Heart failure with improved ejection fraction (HFimpEF) (HCC) EF previously  45.  Echocardiogram August 2025 with normal EF.  Volume status stable.  Recent BMP normal.  Continue with furosemide  as needed lower extremity edema. Nonrheumatic aortic valve insufficiency Mild to moderate aortic insufficiency by most recent echocardiogram August 2025.  Consider repeat echocardiogram in 1 to 2 years.  Follow-up 1 year. Benign essential HTN Blood pressure above target.  She does have lower extremity edema.  It is possible that increasing amlodipine  may worsen her edema.  With her chronic kidney disease, she may benefit from the addition of ACE/ARB. -Monitor blood pressure over 2 weeks and send readings for review -If blood pressure still above target, increase amlodipine  to 5 mg daily -If edema worsens with increased dose of amlodipine , switch to losartan 50 mg daily Stage 3a chronic kidney disease  (HCC) Recent creatinine stable. Physical deconditioning She is fairly deconditioned since her knee and hip surgeries in the past.  Her daughter notes she is fairly sedentary.  We discussed the importance of increased mobility.  I have suggested she discuss with primary care referral back to physical therapy.         Dispo:  Return in about 1 year (around 04/14/2025) for Routine Follow Up w Dr. Lavona.  Signed, Glendia Ferrier, PA-C

## 2024-04-14 ENCOUNTER — Ambulatory Visit: Attending: Physician Assistant | Admitting: Physician Assistant

## 2024-04-14 ENCOUNTER — Encounter: Payer: Self-pay | Admitting: Physician Assistant

## 2024-04-14 VITALS — BP 144/76 | HR 64 | Ht 64.0 in | Wt 191.4 lb

## 2024-04-14 DIAGNOSIS — N1831 Chronic kidney disease, stage 3a: Secondary | ICD-10-CM

## 2024-04-14 DIAGNOSIS — I10 Essential (primary) hypertension: Secondary | ICD-10-CM | POA: Diagnosis not present

## 2024-04-14 DIAGNOSIS — I5032 Chronic diastolic (congestive) heart failure: Secondary | ICD-10-CM

## 2024-04-14 DIAGNOSIS — R5381 Other malaise: Secondary | ICD-10-CM | POA: Diagnosis not present

## 2024-04-14 DIAGNOSIS — I351 Nonrheumatic aortic (valve) insufficiency: Secondary | ICD-10-CM | POA: Diagnosis not present

## 2024-04-14 NOTE — Patient Instructions (Signed)
 Medication Instructions:  Your physician recommends that you continue on your current medications as directed. Please refer to the Current Medication list given to you today.  *If you need a refill on your cardiac medications before your next appointment, please call your pharmacy*  Lab Work: None ordered  If you have labs (blood work) drawn today and your tests are completely normal, you will receive your results only by: MyChart Message (if you have MyChart) OR A paper copy in the mail If you have any lab test that is abnormal or we need to change your treatment, we will call you to review the results.  Testing/Procedures: None ordered  Follow-Up: At [facility], you and your health needs are our priority.  As part of our continuing mission to provide you with exceptional heart care, our providers are all part of one team.  This team includes your primary Cardiologist (physician) and Advanced Practice Providers or APPs (Physician Assistants and Nurse Practitioners) who all work together to provide you with the care you need, when you need it.  Your next appointment:   1 year(s)  Provider:   Lynwood Schilling, MD    We recommend signing up for the patient portal called MyChart.  Sign up information is provided on this After Visit Summary.  MyChart is used to connect with patients for Virtual Visits (Telemedicine).  Patients are able to view lab/test results, encounter notes, upcoming appointments, etc.  Non-urgent messages can be sent to your provider as well.   To learn more about what you can do with MyChart, go to [email or web address].   Other Instructions Your physician has requested that you regularly monitor and record your blood pressure readings at home. Please use the same machine at the same time of day to check your readings and record them to bring to your follow-up visit.   Please monitor blood pressures and keep a log of your readings for 2 weeks then send  them via mychart.    Make sure to check 2 hours after your medications.    AVOID these things for 30 minutes before checking your blood pressure: No Drinking caffeine. No Drinking alcohol. No Eating. No Smoking. No Exercising.   Five minutes before checking your blood pressure: Pee. Sit in a dining chair. Avoid sitting in a soft couch or armchair. Be quiet. Do not talk

## 2024-04-14 NOTE — Assessment & Plan Note (Signed)
 Mild to moderate aortic insufficiency by most recent echocardiogram August 2025.  Consider repeat echocardiogram in 1 to 2 years.  Follow-up 1 year.

## 2024-04-14 NOTE — Assessment & Plan Note (Signed)
 EF previously 45.  Echocardiogram August 2025 with normal EF.  Volume status stable.  Recent BMP normal.  Continue with furosemide  as needed lower extremity edema.

## 2024-04-14 NOTE — Assessment & Plan Note (Signed)
 Blood pressure above target.  She does have lower extremity edema.  It is possible that increasing amlodipine  may worsen her edema.  With her chronic kidney disease, she may benefit from the addition of ACE/ARB. -Monitor blood pressure over 2 weeks and send readings for review -If blood pressure still above target, increase amlodipine  to 5 mg daily -If edema worsens with increased dose of amlodipine , switch to losartan 50 mg daily

## 2024-04-14 NOTE — Assessment & Plan Note (Signed)
Recent creatinine stable. 

## 2024-06-01 DIAGNOSIS — M810 Age-related osteoporosis without current pathological fracture: Secondary | ICD-10-CM | POA: Diagnosis not present

## 2024-06-01 DIAGNOSIS — E039 Hypothyroidism, unspecified: Secondary | ICD-10-CM | POA: Diagnosis not present

## 2024-06-01 DIAGNOSIS — Z79899 Other long term (current) drug therapy: Secondary | ICD-10-CM | POA: Diagnosis not present

## 2024-06-01 DIAGNOSIS — R29898 Other symptoms and signs involving the musculoskeletal system: Secondary | ICD-10-CM | POA: Diagnosis not present

## 2024-06-01 DIAGNOSIS — I10 Essential (primary) hypertension: Secondary | ICD-10-CM | POA: Diagnosis not present

## 2024-06-01 DIAGNOSIS — I5030 Unspecified diastolic (congestive) heart failure: Secondary | ICD-10-CM | POA: Diagnosis not present

## 2024-07-04 ENCOUNTER — Encounter (HOSPITAL_COMMUNITY): Payer: Self-pay | Admitting: Emergency Medicine

## 2024-07-04 ENCOUNTER — Emergency Department (HOSPITAL_COMMUNITY)

## 2024-07-04 ENCOUNTER — Other Ambulatory Visit: Payer: Self-pay

## 2024-07-04 ENCOUNTER — Inpatient Hospital Stay (HOSPITAL_COMMUNITY)
Admission: EM | Admit: 2024-07-04 | Discharge: 2024-07-10 | DRG: 481 | Disposition: A | Discharge status: Skilled Nursing Facility | Attending: Internal Medicine | Admitting: Internal Medicine

## 2024-07-04 DIAGNOSIS — S72141A Displaced intertrochanteric fracture of right femur, initial encounter for closed fracture: Secondary | ICD-10-CM | POA: Diagnosis not present

## 2024-07-04 DIAGNOSIS — I130 Hypertensive heart and chronic kidney disease with heart failure and stage 1 through stage 4 chronic kidney disease, or unspecified chronic kidney disease: Secondary | ICD-10-CM | POA: Diagnosis not present

## 2024-07-04 DIAGNOSIS — W19XXXA Unspecified fall, initial encounter: Principal | ICD-10-CM

## 2024-07-04 DIAGNOSIS — S61411A Laceration without foreign body of right hand, initial encounter: Secondary | ICD-10-CM

## 2024-07-04 DIAGNOSIS — I5032 Chronic diastolic (congestive) heart failure: Secondary | ICD-10-CM | POA: Diagnosis not present

## 2024-07-04 DIAGNOSIS — S72001A Fracture of unspecified part of neck of right femur, initial encounter for closed fracture: Secondary | ICD-10-CM

## 2024-07-04 DIAGNOSIS — N1832 Chronic kidney disease, stage 3b: Secondary | ICD-10-CM | POA: Diagnosis not present

## 2024-07-04 DIAGNOSIS — Z8781 Personal history of (healed) traumatic fracture: Secondary | ICD-10-CM | POA: Diagnosis not present

## 2024-07-04 DIAGNOSIS — W010XXA Fall on same level from slipping, tripping and stumbling without subsequent striking against object, initial encounter: Secondary | ICD-10-CM | POA: Diagnosis not present

## 2024-07-04 LAB — CBC WITH DIFFERENTIAL/PLATELET
Abs Immature Granulocytes: 0.09 K/uL — ABNORMAL HIGH (ref 0.00–0.07)
Basophils Absolute: 0.1 K/uL (ref 0.0–0.1)
Basophils Relative: 0 %
Eosinophils Absolute: 0 K/uL (ref 0.0–0.5)
Eosinophils Relative: 0 %
HCT: 41.2 % (ref 36.0–46.0)
Hemoglobin: 13.5 g/dL (ref 12.0–15.0)
Immature Granulocytes: 1 %
Lymphocytes Relative: 8 %
Lymphs Abs: 1.2 K/uL (ref 0.7–4.0)
MCH: 30.3 pg (ref 26.0–34.0)
MCHC: 32.8 g/dL (ref 30.0–36.0)
MCV: 92.4 fL (ref 80.0–100.0)
Monocytes Absolute: 0.5 K/uL (ref 0.1–1.0)
Monocytes Relative: 4 %
Neutro Abs: 12.1 K/uL — ABNORMAL HIGH (ref 1.7–7.7)
Neutrophils Relative %: 87 %
Platelets: 170 K/uL (ref 150–400)
RBC: 4.46 MIL/uL (ref 3.87–5.11)
RDW: 12.6 % (ref 11.5–15.5)
WBC: 13.9 K/uL — ABNORMAL HIGH (ref 4.0–10.5)
nRBC: 0 % (ref 0.0–0.2)

## 2024-07-04 LAB — TYPE AND SCREEN
ABO/RH(D): O POS
Antibody Screen: NEGATIVE

## 2024-07-04 LAB — BASIC METABOLIC PANEL WITH GFR
Anion gap: 12 (ref 5–15)
BUN: 26 mg/dL — ABNORMAL HIGH (ref 8–23)
CO2: 26 mmol/L (ref 22–32)
Calcium: 9.9 mg/dL (ref 8.9–10.3)
Chloride: 104 mmol/L (ref 98–111)
Creatinine, Ser: 1.14 mg/dL — ABNORMAL HIGH (ref 0.44–1.00)
GFR, Estimated: 48 mL/min — ABNORMAL LOW (ref >60)
Glucose, Bld: 129 mg/dL — ABNORMAL HIGH (ref 70–99)
Potassium: 3.6 mmol/L (ref 3.5–5.1)
Sodium: 143 mmol/L (ref 135–145)

## 2024-07-04 LAB — PROTIME-INR
INR: 1 (ref 0.8–1.2)
Prothrombin Time: 13.7 s (ref 11.4–15.2)

## 2024-07-04 MED ORDER — LEVOTHYROXINE SODIUM 137 MCG PO TABS: 137.0000 ug | ORAL_TABLET | Freq: Every day | ORAL | Status: DC

## 2024-07-04 MED ORDER — PROCHLORPERAZINE EDISYLATE 10 MG/2ML IJ SOLN: 5.0000 mg | Freq: Four times a day (QID) | INTRAMUSCULAR | Status: DC | PRN

## 2024-07-04 MED ORDER — OXYCODONE HCL 5 MG PO TABS: 10.0000 mg | ORAL_TABLET | ORAL | Status: DC | PRN

## 2024-07-04 MED ORDER — LACTATED RINGERS IV SOLN: INTRAVENOUS | Status: AC

## 2024-07-04 MED ORDER — FENTANYL CITRATE (PF) 50 MCG/ML IJ SOSY
50.0000 ug | PREFILLED_SYRINGE | Freq: Once | INTRAMUSCULAR | Status: AC
  Administered 2024-07-04: 50 ug via INTRAVENOUS
  Filled 2024-07-04: qty 1

## 2024-07-04 MED ORDER — HYDROMORPHONE HCL 1 MG/ML IJ SOLN
0.5000 mg | INTRAMUSCULAR | Status: DC | PRN
  Filled 2024-07-04: qty 0.5

## 2024-07-04 MED ORDER — ACETAMINOPHEN 325 MG PO TABS
975.0000 mg | ORAL_TABLET | Freq: Three times a day (TID) | ORAL | Status: DC
  Administered 2024-07-04 – 2024-07-10 (×15): 975 mg via ORAL
  Filled 2024-07-04 (×17): qty 3

## 2024-07-04 MED ORDER — LOSARTAN POTASSIUM 25 MG PO TABS
25.0000 mg | ORAL_TABLET | Freq: Every day | ORAL | Status: DC
  Administered 2024-07-06 – 2024-07-10 (×5): 25 mg via ORAL
  Filled 2024-07-04 (×5): qty 1

## 2024-07-04 MED ORDER — OXYCODONE HCL 5 MG PO TABS
5.0000 mg | ORAL_TABLET | ORAL | Status: DC | PRN
  Filled 2024-07-04: qty 1

## 2024-07-04 MED ORDER — INFLUENZA VAC SPLIT HIGH-DOSE 0.5 ML IM SUSY
0.5000 mL | PREFILLED_SYRINGE | INTRAMUSCULAR | Status: AC | PRN
  Administered 2024-07-10: 0.5 mL via INTRAMUSCULAR
  Filled 2024-07-04: qty 0.5

## 2024-07-04 MED ORDER — POLYETHYLENE GLYCOL 3350 17 G PO PACK: 17.0000 g | PACK | Freq: Every day | ORAL | Status: DC | PRN

## 2024-07-04 MED ORDER — LEVOTHYROXINE SODIUM 100 MCG PO TABS
100.0000 ug | ORAL_TABLET | Freq: Every day | ORAL | Status: DC
  Administered 2024-07-05 – 2024-07-10 (×6): 100 ug via ORAL
  Filled 2024-07-04 (×6): qty 1

## 2024-07-04 MED ORDER — AMLODIPINE BESYLATE 5 MG PO TABS: 5.0000 mg | ORAL_TABLET | Freq: Every day | ORAL | Status: DC

## 2024-07-04 MED ORDER — ONDANSETRON HCL 4 MG/2ML IJ SOLN
4.0000 mg | Freq: Once | INTRAMUSCULAR | Status: AC
  Administered 2024-07-04: 4 mg via INTRAVENOUS
  Filled 2024-07-04: qty 2

## 2024-07-04 MED ORDER — CHLORHEXIDINE GLUCONATE CLOTH 2 % EX PADS
6.0000 | MEDICATED_PAD | Freq: Every day | CUTANEOUS | Status: DC
  Administered 2024-07-04 – 2024-07-06 (×3): 6 via TOPICAL

## 2024-07-04 MED ORDER — METHOCARBAMOL 500 MG PO TABS
500.0000 mg | ORAL_TABLET | Freq: Four times a day (QID) | ORAL | Status: DC | PRN
  Administered 2024-07-05 (×2): 500 mg via ORAL
  Filled 2024-07-04 (×2): qty 1

## 2024-07-04 MED ORDER — ACETAMINOPHEN 500 MG PO TABS: 500.0000 mg | ORAL_TABLET | Freq: Four times a day (QID) | ORAL | Status: DC | PRN

## 2024-07-04 MED ORDER — MELATONIN 5 MG PO TABS
5.0000 mg | ORAL_TABLET | Freq: Every evening | ORAL | Status: DC | PRN
  Administered 2024-07-09: 5 mg via ORAL
  Filled 2024-07-04: qty 1

## 2024-07-04 NOTE — H&P (Signed)
 History and Physical  Terri Ortega FMW:[id number] DOB: 01/11/1941 DOA: 07/04/2024  Referring physician: Dr. Lenor, EDP  PCP: Okey Carlin Redbird, MD  Outpatient Specialists: Cardiology. Patient coming from: Home.  Chief Complaint: Mechanical fall.  HPI: Terri Ortega is a 83 y.o. female with medical history significant for chronic HFpEF, hypertension, hypothyroidism, who presents to the ER after a mechanical fall.  The patient tripped and fell landing on her right hip.  Did not hit her head.  No loss of consciousness.  Not on blood thinners.  EMS was activated.  She received 100 mcg of fentanyl  en route.  Brought into the ER for further evaluation.  In the ER, hypertensive with SBP in the 200s.  R knee x-ray without evidence of hardware loosening or acute fracture.  Right hip x-ray shows an acute right femoral intratrochanteric fracture.  The patient received IV opiate-based analgesics.    EDP discussed the case with orthopedic surgery from [facility] Dr. Teresa.  Recommended admission by the medicine team.  Plan for orthopedic surgical repair tomorrow 07/05/24.  Admitted by [facility], hospitalist service.  ED Course: Temperature 98.8.  BP 195/69, pulse 63, respiratory 20, O2 saturation 92% on room air.  Review of Systems: Review of systems as noted in the HPI. All other systems reviewed and are negative.   Past Medical History:  Diagnosis Date   Graves disease    HTN (hypertension)    Low back pain    Dr. Joshua   Osteoporosis    Pneumonia 2012   Radiation 03/20/14, 03/27/14, 04/05/14, 04/12/14, 04/17/14   bracytherapy to proximal vagina 30 gray   Scoliosis    Uterine cancer (HCC)    surgery and radiation x 5 treatments-last 9'15   Vitamin D  deficiency    Past Surgical History:  Procedure Laterality Date   BUNIONECTOMY     left   CATARACT EXTRACTION, BILATERAL Bilateral    last done 11-21-14   COLONOSCOPY WITH PROPOFOL  N/A 12/25/2014   Procedure: COLONOSCOPY WITH PROPOFOL ;   Surgeon: Gladis MARLA Louder, MD;  Location: [facility];  Service: Endoscopy;  Laterality: N/A;   FOOT SURGERY Left 2004   Dr Eben   JOINT REPLACEMENT     RTKA   ROBOTIC ASSISTED TOTAL HYSTERECTOMY WITH BILATERAL SALPINGO OOPHERECTOMY  01/30/14   with lymph node biopsy   TOTAL HIP ARTHROPLASTY Left 04/17/2022   Procedure: TOTAL HIP ARTHROPLASTY ANTERIOR APPROACH;  Surgeon: Fidel Rogue, MD;  Location: [facility] OR;  Service: Orthopedics;  Laterality: Left;   TOTAL KNEE ARTHROPLASTY     right   TOTAL KNEE REVISION Right 01/04/2019   Procedure: TOTAL KNEE REVISION;  Surgeon: Melodi Lerner, MD;  Location: [facility];  Service: Orthopedics;  Laterality: Right;  with block    Social History:  reports that she has never smoked. She has never used smokeless tobacco. She reports that she does not drink alcohol and does not use drugs.   Allergies  Allergen Reactions   Percocet [Oxycodone -Acetaminophen ] Nausea And Vomiting    Family History  Adopted: Yes  Problem Relation Age of Onset   Heart disease Mother 40       No details   Memory loss Neg Hx    Dementia Neg Hx       Prior to Admission medications   Medication Sig Start Date End Date Taking? Authorizing Provider  amLODipine  (NORVASC ) 2.5 MG tablet Take 1 tablet (2.5 mg total) by mouth daily. 06/28/23   Lavona Agent, MD  Cholecalciferol  (VITAMIN D3  PO) Take 2,000 Int'l Units by mouth daily.    [provider]  donepezil  (ARICEPT ) 10 MG tablet Take 1 tablet (10 mg total) by mouth at bedtime. 10/07/22   Ines Onetha NOVAK, MD  furosemide  (LASIX ) 20 MG tablet Take 1 tablet (20 mg total) by mouth daily as needed for fluid or edema. 01/26/24   Lavona Agent, MD  ibandronate (BONIVA) 150 MG tablet Take 150 mg by mouth every 30 (thirty) days. Pt takes on the 25th of each month (Is on hold) Patient not taking: Reported on 04/14/2024    [provider]  levothyroxine  (SYNTHROID ) 137 MCG tablet Take 137 mcg by mouth daily  before breakfast.    [provider]  traMADol  (ULTRAM ) 50 MG tablet Take 1 tablet (50 mg total) by mouth 2 (two) times daily as needed. 05/15/22   Mast, Man X, NP    Physical Exam: BP (!) 195/69 (BP Location: Right Arm)   Pulse 63   Temp 98.8 F (37.1 C) (Oral)   Resp 20   SpO2 92%   General: 83 y.o. year-old female well developed well nourished in no acute distress.  Alert and oriented x3. Cardiovascular: Regular rate and rhythm with no rubs or gallops.  No thyromegaly or JVD noted.  No lower extremity edema. 2/4 pulses in all 4 extremities. Respiratory: Clear to auscultation with no wheezes or rales. Good inspiratory effort. Abdomen: Soft nontender nondistended with normal bowel sounds x4 quadrants. Muskuloskeletal: No cyanosis, clubbing or edema noted bilaterally Neuro: CN II-XII intact, strength, sensation, reflexes Skin: No ulcerative lesions noted or rashes Psychiatry: Judgement and insight appear normal. Mood is appropriate for condition and setting          Labs on Admission:  Basic Metabolic Panel: Recent Labs  Lab 07/04/24 2004  NA 143  K 3.6  CL 104  CO2 26  GLUCOSE 129*  BUN 26*  CREATININE 1.14*  CALCIUM  9.9   Liver Function Tests: No results for input(s): AST, ALT, ALKPHOS, BILITOT, PROT, ALBUMIN in the last 168 hours. No results for input(s): LIPASE, AMYLASE in the last 168 hours. No results for input(s): AMMONIA in the last 168 hours. CBC: Recent Labs  Lab 07/04/24 2004  WBC 13.9*  NEUTROABS 12.1*  HGB 13.5  HCT 41.2  MCV 92.4  PLT 170   Cardiac Enzymes: No results for input(s): CKTOTAL, CKMB, CKMBINDEX, TROPONINI in the last 168 hours.  BNP (last 3 results) No results for input(s): BNP in the last 8760 hours.  ProBNP (last 3 results) Recent Labs    01/13/24 1020  PROBNP 628    CBG: No results for input(s): GLUCAP in the last 168 hours.  Radiological Exams on Admission: DG Knee 2 Views  Right Result Date: 07/04/2024 CLINICAL DATA:  Fall, knee pain EXAM: RIGHT KNEE - 1-2 VIEW COMPARISON:  None Available. FINDINGS: Right knee total arthroplasty present. No evidence for hardware loosening or acute fracture. Joint spaces are maintained and alignment is anatomic. Soft tissues are within normal limits. IMPRESSION: Right knee total arthroplasty without evidence for hardware loosening or acute fracture. Electronically Signed   By: Greig Pique M.D.   On: 07/04/2024 19:50   DG Hip Unilat  With Pelvis 2-3 Views Right Result Date: 07/04/2024 CLINICAL DATA:  Pain after fall EXAM: DG HIP (WITH OR WITHOUT PELVIS) 2-3V RIGHT COMPARISON:  None Available. FINDINGS: There is an acute right femoral intratrochanteric fracture which is nondisplaced. The lesser trochanter is a free fracture fragment displaced medially 1  cm. There is no dislocation. Left hip arthroplasty appears in anatomic alignment. There are degenerative changes of the lumbar spine. IMPRESSION: Acute right femoral intratrochanteric fracture. Electronically Signed   By: Greig Pique M.D.   On: 07/04/2024 19:41    EKG: I independently viewed the EKG done and my findings are as followed: Sinus rhythm rate of 67.  QTc 473.  Assessment/Plan Present on Admission: **None**  Principal Problem:   S/P right hip fracture  Right hip fracture secondary to mechanical fall, POA As needed analgesics, gentle IV fluid hydration. Plan for orthopedic surgical repair on 07/05/2024 afternoon Fall precautions.  Leukocytosis, reactive in the setting of hip fracture WBC 13.9 Afebrile Nontoxic-appearing  Hypertension BP is not at goal, elevated Resume home oral antihypertensives Closely monitor vital signs.  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  Chronic HFpEF Euvolemic on exam Last 2D echo  done on 03/27/2024 revealed LVEF 60 to 65% Monitor strict I's and O's and daily weight  Hypothyroidism Resume home levothyroxine .   Time: 75 minutes.   DVT prophylaxis: SCDs.  Code Status: Full code.  Family Communication: None at bedside.  Disposition Plan: Admitted to telemetry unit.  Consults called: Orthopedic surgery consulted by EDP.  Admission status: Inpatient status.   Status is: Inpatient The patient requires at least 2 midnights for further evaluation and treatment of present condition.   Terry LOISE Hurst MD [facility] Pager [phone]  If 7PM-7AM, please contact night-coverage [email or web address] Password [email or web address]  07/04/2024, 9:24 PM

## 2024-07-04 NOTE — ED Triage Notes (Signed)
 Patient tripped and fell, landing on her right hip. She now has pain in the right hip and shortening with rotation per EMS. She did not hit her head, did not lose consciousness and is not on blood thinners. EMS noted pedal pulses. They also administered 100 mcg of fentanyl .     EMS vitals 154/86 BP 64 HR 97% SPO2 on 3L O2 (placed after fentanyl  administration)

## 2024-07-04 NOTE — Plan of Care (Signed)

## 2024-07-04 NOTE — Consult Note (Signed)
 ORTHOPAEDIC CONSULTATION  REQUESTING PHYSICIAN: Lenor Hollering, MD  PCP:  Okey Carlin Redbird, MD  Chief Complaint: Right hip pain  HPI: Terri Ortega is a 83 y.o. female who complains of  ***  Past Medical History:  Diagnosis Date   Graves disease    HTN (hypertension)    Low back pain    Dr. Joshua   Osteoporosis    Pneumonia 2012   Radiation 03/20/14, 03/27/14, 04/05/14, 04/12/14, 04/17/14   bracytherapy to proximal vagina 30 gray   Scoliosis    Uterine cancer (HCC)    surgery and radiation x 5 treatments-last 9'15   Vitamin D  deficiency    Past Surgical History:  Procedure Laterality Date   BUNIONECTOMY     left   CATARACT EXTRACTION, BILATERAL Bilateral    last done 11-21-14   COLONOSCOPY WITH PROPOFOL  N/A 12/25/2014   Procedure: COLONOSCOPY WITH PROPOFOL ;  Surgeon: Gladis MARLA Louder, MD;  Location: [facility];  Service: Endoscopy;  Laterality: N/A;   FOOT SURGERY Left 2004   Dr Eben   JOINT REPLACEMENT     RTKA   ROBOTIC ASSISTED TOTAL HYSTERECTOMY WITH BILATERAL SALPINGO OOPHERECTOMY  01/30/14   with lymph node biopsy   TOTAL HIP ARTHROPLASTY Left 04/17/2022   Procedure: TOTAL HIP ARTHROPLASTY ANTERIOR APPROACH;  Surgeon: Fidel Rogue, MD;  Location: [facility] OR;  Service: Orthopedics;  Laterality: Left;   TOTAL KNEE ARTHROPLASTY     right   TOTAL KNEE REVISION Right 01/04/2019   Procedure: TOTAL KNEE REVISION;  Surgeon: Melodi Lerner, MD;  Location: [facility];  Service: Orthopedics;  Laterality: Right;  with block   Social History   Socioeconomic History   Marital status: Married    Spouse name: Not on file   Number of children: 3   Years of education: Not on file   Highest education level: High school graduate  Occupational History   Occupation: materials engineer  Tobacco Use   Smoking status: Never   Smokeless tobacco: Never  Vaping Use   Vaping status: Never Used  Substance and Sexual Activity   Alcohol use: Never   Drug use: Never    Sexual activity: Not Currently  Other Topics Concern   Not on file  Social History Narrative   Lives at home with husband, Zell.  Four grand and five great grands.     Caffeine: diet coke about 1-2 per day    Right handed   Social Drivers of Health   Financial Resource Strain: Not on file  Food Insecurity: No Food Insecurity (04/22/2022)   Hunger Vital Sign    Worried About Running Out of Food in the Last Year: Never true    Ran Out of Food in the Last Year: Never true  Transportation Needs: No Transportation Needs (04/22/2022)   PRAPARE - Administrator, Civil Service (Medical): No    Lack of Transportation (Non-Medical): No  Physical Activity: Not on file  Stress: Not on file  Social Connections: Not on file   Family History  Adopted: Yes  Problem Relation Age of Onset   Heart disease Mother 37       No details   Memory loss Neg Hx    Dementia Neg Hx    Allergies  Allergen Reactions   Percocet [Oxycodone -Acetaminophen ] Nausea And Vomiting   Prior to Admission medications   Medication Sig Start Date End Date Taking? Authorizing Provider  amLODipine  (NORVASC ) 2.5 MG tablet Take 1 tablet (2.5 mg total)  by mouth daily. 06/28/23   Lavona Agent, MD  Cholecalciferol  (VITAMIN D3 PO) Take 2,000 Int'l Units by mouth daily.    [provider]  donepezil  (ARICEPT ) 10 MG tablet Take 1 tablet (10 mg total) by mouth at bedtime. 10/07/22   Ines Onetha NOVAK, MD  furosemide  (LASIX ) 20 MG tablet Take 1 tablet (20 mg total) by mouth daily as needed for fluid or edema. 01/26/24   Lavona Agent, MD  ibandronate (BONIVA) 150 MG tablet Take 150 mg by mouth every 30 (thirty) days. Pt takes on the 25th of each month (Is on hold) Patient not taking: Reported on 04/14/2024    [provider]  levothyroxine  (SYNTHROID ) 137 MCG tablet Take 137 mcg by mouth daily before breakfast.    [provider]  traMADol  (ULTRAM ) 50 MG tablet Take 1 tablet (50 mg total) by  mouth 2 (two) times daily as needed. 05/15/22   Mast, Man X, NP   DG Knee 2 Views Right Result Date: 07/04/2024 CLINICAL DATA:  Fall, knee pain EXAM: RIGHT KNEE - 1-2 VIEW COMPARISON:  None Available. FINDINGS: Right knee total arthroplasty present. No evidence for hardware loosening or acute fracture. Joint spaces are maintained and alignment is anatomic. Soft tissues are within normal limits. IMPRESSION: Right knee total arthroplasty without evidence for hardware loosening or acute fracture. Electronically Signed   By: Greig Pique M.D.   On: 07/04/2024 19:50   DG Hip Unilat  With Pelvis 2-3 Views Right Result Date: 07/04/2024 CLINICAL DATA:  Pain after fall EXAM: DG HIP (WITH OR WITHOUT PELVIS) 2-3V RIGHT COMPARISON:  None Available. FINDINGS: There is an acute right femoral intratrochanteric fracture which is nondisplaced. The lesser trochanter is a free fracture fragment displaced medially 1 cm. There is no dislocation. Left hip arthroplasty appears in anatomic alignment. There are degenerative changes of the lumbar spine. IMPRESSION: Acute right femoral intratrochanteric fracture. Electronically Signed   By: Greig Pique M.D.   On: 07/04/2024 19:41    Positive ROS: All other systems have been reviewed and were otherwise negative with the exception of those mentioned in the HPI and as above.  Physical Exam: General: Alert, no acute distress Cardiovascular: No pedal edema Respiratory: No cyanosis, no use of accessory musculature GI: No organomegaly, abdomen is soft and non-tender Skin: No lesions in the area of chief complaint Neurologic: Sensation intact distally Psychiatric: Patient is competent for consent with normal mood and affect Lymphatic: No axillary or cervical lymphadenopathy  MUSCULOSKELETAL: ***  Imaging:  X-rays: *** views of  *** were obtained and reviewed.  My independent interpretation is as follows: *** Assessment: ***  Plan: ***    Rankin LELON Pizza,  MD    07/04/2024 7:53 PM

## 2024-07-04 NOTE — ED Provider Notes (Signed)
 [place] EMERGENCY DEPARTMENT AT [facility] Provider Note   CSN: [id number] Arrival date & time: 07/04/24  1758     Patient presents with: Fall and Hip Pain   Terri Ortega is a 83 y.o. female.   Patient is a 83 year old female who presents after a fall.  Family members at bedside who helps provide history as well.  She was rushing to get into her daughter's house and tripped on the edge of the concrete.  She apparently uses a cane and has some balance instability at baseline.  She denies any symptoms preceding the fall.  No dizziness.  No chest pain or shortness of breath.  No palpitations.  She is not on anticoagulants.  She fell onto her right hip.  She also has a small laceration to her right hand.  She denies any other injuries.  She is confident she did not hit her head.  She denies any neck or back pain.  She has not been able to put weight on it.  She was brought in by EMS.  She was given 100 mcg of fentanyl  by EMS prior to arrival.  She thinks her tetanus shot is up-to-date.       Prior to Admission medications   Medication Sig Start Date End Date Taking? Authorizing Provider  amLODipine  (NORVASC ) 2.5 MG tablet Take 1 tablet (2.5 mg total) by mouth daily. 06/28/23   Lavona Agent, MD  Cholecalciferol  (VITAMIN D3 PO) Take 2,000 Int'l Units by mouth daily.    [provider]  donepezil  (ARICEPT ) 10 MG tablet Take 1 tablet (10 mg total) by mouth at bedtime. 10/07/22   Ines Onetha NOVAK, MD  furosemide  (LASIX ) 20 MG tablet Take 1 tablet (20 mg total) by mouth daily as needed for fluid or edema. 01/26/24   Lavona Agent, MD  ibandronate (BONIVA) 150 MG tablet Take 150 mg by mouth every 30 (thirty) days. Pt takes on the 25th of each month (Is on hold) Patient not taking: Reported on 04/14/2024    [provider]  levothyroxine  (SYNTHROID ) 137 MCG tablet Take 137 mcg by mouth daily before breakfast.    [provider]  traMADol  (ULTRAM ) 50 MG  tablet Take 1 tablet (50 mg total) by mouth 2 (two) times daily as needed. 05/15/22   Mast, Man X, NP    Allergies: Percocet [oxycodone -acetaminophen ]    Review of Systems  Constitutional:  Negative for fatigue and fever.  Respiratory:  Negative for shortness of breath.   Cardiovascular:  Negative for chest pain and palpitations.  Gastrointestinal:  Positive for nausea (After pain medications). Negative for abdominal pain and vomiting.  Musculoskeletal:  Positive for arthralgias. Negative for back pain and neck pain.  Skin:  Positive for wound.  Neurological:  Negative for headaches.    Updated Vital Signs BP (!) 195/69 (BP Location: Right Arm)   Pulse 63   Temp 98.8 F (37.1 C) (Oral)   Resp 20   SpO2 92%   Physical Exam Constitutional:      Appearance: She is well-developed.  HENT:     Head: Normocephalic and atraumatic.  Eyes:     Pupils: Pupils are equal, round, and reactive to light.  Cardiovascular:     Rate and Rhythm: Normal rate and regular rhythm.     Heart sounds: Normal heart sounds.  Pulmonary:     Effort: Pulmonary effort is normal. No respiratory distress.     Breath sounds: Normal breath sounds. No wheezing or  rales.  Chest:     Chest wall: No tenderness.  Abdominal:     General: Bowel sounds are normal.     Palpations: Abdomen is soft.     Tenderness: There is no abdominal tenderness. There is no guarding or rebound.  Musculoskeletal:        General: Normal range of motion.     Cervical back: Normal range of motion and neck supple.     Comments: Positive external rotation and shortening of the right leg.  There is pain to the right hip and the right knee.  No pain to the ankle.  Pedal pulses intact.  She has normal sensation and motor function in the foot.  There is no pain on range of motion or palpation of the other extremities.  There is a small less than 1 cm V-shaped laceration to the volar surface of the right hand at the base of the fifth digit.   There is no underlying bony tenderness.  No bleeding.  Edges are well-approximated  Lymphadenopathy:     Cervical: No cervical adenopathy.  Skin:    General: Skin is warm and dry.     Findings: No rash.  Neurological:     Mental Status: She is alert and oriented to person, place, and time.     (all labs ordered are listed, but only abnormal results are displayed) Labs Reviewed  BASIC METABOLIC PANEL WITH GFR - Abnormal; Notable for the following components:      Result Value   Glucose, Bld 129 (*)    BUN 26 (*)    Creatinine, Ser 1.14 (*)    GFR, Estimated 48 (*)    All other components within normal limits  CBC WITH DIFFERENTIAL/PLATELET - Abnormal; Notable for the following components:   WBC 13.9 (*)    Neutro Abs 12.1 (*)    Abs Immature Granulocytes 0.09 (*)    All other components within normal limits  PROTIME-INR  TYPE AND SCREEN    EKG: EKG Interpretation Date/Time:  Tuesday July 04 2024 19:37:01 EST Ventricular Rate:  67 PR Interval:  180 QRS Duration:  132 QT Interval:  448 QTC Calculation: 473 R Axis:   -21  Text Interpretation: Sinus rhythm Non-specific intra-ventricular conduction block Minimal voltage criteria for LVH, may be normal variant ( Cornell product ) Cannot rule out Septal infarct , age undetermined T wave abnormality, consider lateral ischemia Abnormal ECG When compared with ECG of 11-Mar-2024 19:56, PREVIOUS ECG IS PRESENT since last tracing no significant change Confirmed by Lenor Hollering [phone]) on 07/04/2024 8:30:43 PM  Radiology: ARCOLA Knee 2 Views Right Result Date: 07/04/2024 CLINICAL DATA:  Fall, knee pain EXAM: RIGHT KNEE - 1-2 VIEW COMPARISON:  None Available. FINDINGS: Right knee total arthroplasty present. No evidence for hardware loosening or acute fracture. Joint spaces are maintained and alignment is anatomic. Soft tissues are within normal limits. IMPRESSION: Right knee total arthroplasty without evidence for hardware loosening or  acute fracture. Electronically Signed   By: Greig Pique M.D.   On: 07/04/2024 19:50   DG Hip Unilat  With Pelvis 2-3 Views Right Result Date: 07/04/2024 CLINICAL DATA:  Pain after fall EXAM: DG HIP (WITH OR WITHOUT PELVIS) 2-3V RIGHT COMPARISON:  None Available. FINDINGS: There is an acute right femoral intratrochanteric fracture which is nondisplaced. The lesser trochanter is a free fracture fragment displaced medially 1 cm. There is no dislocation. Left hip arthroplasty appears in anatomic alignment. There are degenerative changes of the lumbar spine.  IMPRESSION: Acute right femoral intratrochanteric fracture. Electronically Signed   By: Greig Pique M.D.   On: 07/04/2024 19:41     Procedures   Medications Ordered in the ED  Chlorhexidine  Gluconate Cloth 2 % PADS 6 each (has no administration in time range)  oxyCODONE  (Oxy IR/ROXICODONE ) immediate release tablet 10 mg (has no administration in time range)  oxyCODONE  (Oxy IR/ROXICODONE ) immediate release tablet 5 mg (has no administration in time range)  acetaminophen  (TYLENOL ) tablet 975 mg (has no administration in time range)  methocarbamol  (ROBAXIN ) tablet 500 mg (has no administration in time range)  HYDROmorphone (DILAUDID) injection 0.5 mg (has no administration in time range)  ondansetron  (ZOFRAN ) injection 4 mg (4 mg Intravenous Given 07/04/24 2007)  fentaNYL  (SUBLIMAZE ) injection 50 mcg (50 mcg Intravenous Given 07/04/24 2007)                                    Medical Decision Making Amount and/or Complexity of Data Reviewed Labs: ordered. Radiology: ordered.  Risk Prescription drug management. Decision regarding hospitalization.   This patient presents to the ED for concern of right hip pain, fall, this involves an extensive number of treatment options, and is a complaint that carries with it a high risk of complications and morbidity.  I considered the following differential and admission for this acute, potentially  life threatening condition.  The differential diagnosis includes fracture, contusion, CKD  MDM:    Patient is an 83 year old who presents after what appears to be a mechanical fall.  She has not had any symptoms preceding the fall.  She primarily has pain in her right hip with shortening and rotation of the leg.  She also has some tenderness in her right knee.  X-rays were performed which shows an intertrochanteric fracture of the hip.  No visible fracture of the knee.  She does not have any other apparent injuries.  She adamantly denies any head injury.  No neck or back pain.  She is not on anticoagulants.  She has a small laceration to the palm of her right hand but it does not appear to need suturing.  She feels like her tetanus shot is up-to-date.  I discussed the patient with Dr. Teresa with [facility] who the family requested.  He will plan on taking the patient to the operating room tomorrow afternoon.  Labs reviewed.  Her creatinine is mildly elevated but similar to prior values.  Discussed with Dr. Shona who will admit the patient for further treatment.  (Labs, imaging, consults)  Labs: I Ordered, and personally interpreted labs.  The pertinent results include: Mildly elevated creatinine, mildly elevated WBC count  Imaging Studies ordered: I ordered imaging studies including x-ray right hip, right knee I independently visualized and interpreted imaging. I agree with the radiologist interpretation  Additional history obtained from daughter, son-in-law at bedside.  External records from outside source obtained and reviewed including history  Cardiac Monitoring: The patient was not maintained on a cardiac monitor.  If on the cardiac monitor, I personally viewed and interpreted the cardiac monitored which showed an underlying rhythm of:    Reevaluation: After the interventions noted above, I reevaluated the patient and found that they have :improved  Social Determinants of Health:     Disposition:  admit to hospital  Co morbidities that complicate the patient evaluation  Past Medical History:  Diagnosis Date   Graves disease    HTN (  hypertension)    Low back pain    Dr. Joshua   Osteoporosis    Pneumonia 2012   Radiation 03/20/14, 03/27/14, 04/05/14, 04/12/14, 04/17/14   bracytherapy to proximal vagina 30 gray   Scoliosis    Uterine cancer (HCC)    surgery and radiation x 5 treatments-last 9'15   Vitamin D  deficiency      Medicines Meds ordered this encounter  Medications   ondansetron  (ZOFRAN ) injection 4 mg   fentaNYL  (SUBLIMAZE ) injection 50 mcg   Chlorhexidine  Gluconate Cloth 2 % PADS 6 each   oxyCODONE  (Oxy IR/ROXICODONE ) immediate release tablet 10 mg    Refill:  0   oxyCODONE  (Oxy IR/ROXICODONE ) immediate release tablet 5 mg    Refill:  0   acetaminophen  (TYLENOL ) tablet 975 mg   methocarbamol  (ROBAXIN ) tablet 500 mg   HYDROmorphone (DILAUDID) injection 0.5 mg    I have reviewed the patients home medicines and have made adjustments as needed  Problem List / ED Course: Problem List Items Addressed This Visit   None Visit Diagnoses       Fall, initial encounter    -  Primary     Closed right hip fracture, initial encounter (HCC)         Laceration of right palm, initial encounter                    Final diagnoses:  Fall, initial encounter  Closed right hip fracture, initial encounter (HCC)  Laceration of right palm, initial encounter    ED Discharge Orders     None          Lenor Hollering, MD 07/04/24 2115

## 2024-07-04 NOTE — Progress Notes (Signed)
 83 year old female s/p fall sustaining a right intertrochanteric femur fracture.  She has a history of hypertension and hypothyroidism.  She last ate around 3-3:30 pm today.  The patient has pain localized to the right hip and notes that it is shortened and externally rotated. No numbness or tingling.  There is no OR availability tonight given the pateint's NPO time so we will plan on OR tomorrow for cephalomedullary nail insertion of the right femur. Discussion was had with the patient over the phone regarding risks, benefits and alternatives. She expresses understanding and agrees with the plan to proceed with fixation with a cephalomedullary nail to allow for early weight bearing and mobilization.  The patient will be admitted to the hospitalist service tonight and be evaluated for pre operative clearance. NPO at midnight  Rankin Pizza, MD

## 2024-07-05 ENCOUNTER — Encounter (HOSPITAL_COMMUNITY)
Admission: EM | Disposition: A | Discharge status: Skilled Nursing Facility | Payer: Self-pay | Source: Home / Self Care | Attending: Internal Medicine

## 2024-07-05 ENCOUNTER — Inpatient Hospital Stay (HOSPITAL_COMMUNITY)

## 2024-07-05 ENCOUNTER — Inpatient Hospital Stay (HOSPITAL_COMMUNITY): Admitting: Anesthesiology

## 2024-07-05 ENCOUNTER — Encounter (HOSPITAL_COMMUNITY): Payer: Self-pay | Admitting: Internal Medicine

## 2024-07-05 DIAGNOSIS — Z8781 Personal history of (healed) traumatic fracture: Secondary | ICD-10-CM | POA: Diagnosis not present

## 2024-07-05 HISTORY — PX: INTRAMEDULLARY (IM) NAIL INTERTROCHANTERIC: SHX5875

## 2024-07-05 LAB — CBC WITH DIFFERENTIAL/PLATELET
Abs Immature Granulocytes: 0.05 K/uL (ref 0.00–0.07)
Basophils Absolute: 0 K/uL (ref 0.0–0.1)
Basophils Relative: 0 %
Eosinophils Absolute: 0 K/uL (ref 0.0–0.5)
Eosinophils Relative: 0 %
HCT: 35.6 % — ABNORMAL LOW (ref 36.0–46.0)
Hemoglobin: 11.6 g/dL — ABNORMAL LOW (ref 12.0–15.0)
Immature Granulocytes: 0 %
Lymphocytes Relative: 12 %
Lymphs Abs: 1.5 K/uL (ref 0.7–4.0)
MCH: 30 pg (ref 26.0–34.0)
MCHC: 32.6 g/dL (ref 30.0–36.0)
MCV: 92 fL (ref 80.0–100.0)
Monocytes Absolute: 0.7 K/uL (ref 0.1–1.0)
Monocytes Relative: 6 %
Neutro Abs: 10.1 K/uL — ABNORMAL HIGH (ref 1.7–7.7)
Neutrophils Relative %: 82 %
Platelets: 167 K/uL (ref 150–400)
RBC: 3.87 MIL/uL (ref 3.87–5.11)
RDW: 12.8 % (ref 11.5–15.5)
WBC: 12.5 K/uL — ABNORMAL HIGH (ref 4.0–10.5)
nRBC: 0 % (ref 0.0–0.2)

## 2024-07-05 LAB — BASIC METABOLIC PANEL WITH GFR
Anion gap: 10 (ref 5–15)
BUN: 26 mg/dL — ABNORMAL HIGH (ref 8–23)
CO2: 28 mmol/L (ref 22–32)
Calcium: 9.3 mg/dL (ref 8.9–10.3)
Chloride: 105 mmol/L (ref 98–111)
Creatinine, Ser: 1.12 mg/dL — ABNORMAL HIGH (ref 0.44–1.00)
GFR, Estimated: 49 mL/min — ABNORMAL LOW (ref >60)
Glucose, Bld: 137 mg/dL — ABNORMAL HIGH (ref 70–99)
Potassium: 3.7 mmol/L (ref 3.5–5.1)
Sodium: 143 mmol/L (ref 135–145)

## 2024-07-05 LAB — PHOSPHORUS: Phosphorus: 4.2 mg/dL (ref 2.5–4.6)

## 2024-07-05 LAB — SURGICAL PCR SCREEN
MRSA, PCR: NEGATIVE
Staphylococcus aureus: NEGATIVE

## 2024-07-05 LAB — GLUCOSE, CAPILLARY
Glucose-Capillary: 170 mg/dL — ABNORMAL HIGH (ref 70–99)
Glucose-Capillary: 98 mg/dL (ref 70–99)

## 2024-07-05 LAB — MAGNESIUM: Magnesium: 2.1 mg/dL (ref 1.7–2.4)

## 2024-07-05 SURGERY — FIXATION, FRACTURE, INTERTROCHANTERIC, WITH INTRAMEDULLARY ROD
Anesthesia: General | Laterality: Right

## 2024-07-05 MED ORDER — POVIDONE-IODINE 10 % EX SWAB: 2.0000 | Freq: Once | CUTANEOUS | Status: DC

## 2024-07-05 MED ORDER — DOCUSATE SODIUM 100 MG PO CAPS
100.0000 mg | ORAL_CAPSULE | Freq: Two times a day (BID) | ORAL | Status: DC
  Administered 2024-07-06 – 2024-07-10 (×9): 100 mg via ORAL
  Filled 2024-07-05 (×10): qty 1

## 2024-07-05 MED ORDER — FENTANYL CITRATE (PF) 100 MCG/2ML IJ SOLN
INTRAMUSCULAR | Status: AC
  Filled 2024-07-05: qty 2

## 2024-07-05 MED ORDER — ONDANSETRON HCL 4 MG/2ML IJ SOLN
INTRAMUSCULAR | Status: DC | PRN
  Administered 2024-07-05: 4 mg via INTRAVENOUS

## 2024-07-05 MED ORDER — IPRATROPIUM-ALBUTEROL 0.5-2.5 (3) MG/3ML IN SOLN: 3.0000 mL | RESPIRATORY_TRACT | Status: DC | PRN

## 2024-07-05 MED ORDER — TRANEXAMIC ACID-NACL 1000-0.7 MG/100ML-% IV SOLN
1000.0000 mg | INTRAVENOUS | Status: AC
  Administered 2024-07-05: 1000 mg via INTRAVENOUS
  Filled 2024-07-05: qty 100

## 2024-07-05 MED ORDER — METOPROLOL TARTRATE 5 MG/5ML IV SOLN: 5.0000 mg | INTRAVENOUS | Status: DC | PRN

## 2024-07-05 MED ORDER — ORAL CARE MOUTH RINSE: 15.0000 mL | Freq: Once | OROMUCOSAL | Status: AC

## 2024-07-05 MED ORDER — CEFAZOLIN SODIUM-DEXTROSE 2-4 GM/100ML-% IV SOLN
2.0000 g | INTRAVENOUS | Status: AC
  Administered 2024-07-05: 2 g via INTRAVENOUS
  Filled 2024-07-05: qty 100

## 2024-07-05 MED ORDER — GLUCAGON HCL RDNA (DIAGNOSTIC) 1 MG IJ SOLR: 1.0000 mg | INTRAMUSCULAR | Status: DC | PRN

## 2024-07-05 MED ORDER — ONDANSETRON HCL 4 MG/2ML IJ SOLN
INTRAMUSCULAR | Status: AC
  Filled 2024-07-05: qty 2

## 2024-07-05 MED ORDER — ISOPROPYL ALCOHOL 70 % SOLN
Status: AC
  Filled 2024-07-05: qty 480

## 2024-07-05 MED ORDER — ROCURONIUM BROMIDE 10 MG/ML (PF) SYRINGE
PREFILLED_SYRINGE | INTRAVENOUS | Status: DC | PRN
  Administered 2024-07-05: 50 mg via INTRAVENOUS

## 2024-07-05 MED ORDER — PROPOFOL 10 MG/ML IV BOLUS
INTRAVENOUS | Status: DC | PRN
  Administered 2024-07-05: 100 mg via INTRAVENOUS

## 2024-07-05 MED ORDER — CHLORHEXIDINE GLUCONATE 0.12 % MT SOLN
15.0000 mL | Freq: Once | OROMUCOSAL | Status: AC
  Administered 2024-07-05: 15 mL via OROMUCOSAL

## 2024-07-05 MED ORDER — EPHEDRINE 5 MG/ML INJ
INTRAVENOUS | Status: AC
  Filled 2024-07-05: qty 5

## 2024-07-05 MED ORDER — FENTANYL CITRATE (PF) 100 MCG/2ML IJ SOLN
INTRAMUSCULAR | Status: DC | PRN
  Administered 2024-07-05: 25 ug via INTRAVENOUS
  Administered 2024-07-05: 50 ug via INTRAVENOUS
  Administered 2024-07-05: 25 ug via INTRAVENOUS

## 2024-07-05 MED ORDER — LIDOCAINE HCL (PF) 2 % IJ SOLN
INTRAMUSCULAR | Status: AC
  Filled 2024-07-05: qty 5

## 2024-07-05 MED ORDER — ROCURONIUM BROMIDE 10 MG/ML (PF) SYRINGE
PREFILLED_SYRINGE | INTRAVENOUS | Status: AC
  Filled 2024-07-05: qty 10

## 2024-07-05 MED ORDER — METOCLOPRAMIDE HCL 5 MG PO TABS: 5.0000 mg | ORAL_TABLET | Freq: Three times a day (TID) | ORAL | Status: DC | PRN

## 2024-07-05 MED ORDER — CEFAZOLIN SODIUM-DEXTROSE 2-4 GM/100ML-% IV SOLN
2.0000 g | Freq: Four times a day (QID) | INTRAVENOUS | Status: AC
  Administered 2024-07-06 (×2): 2 g via INTRAVENOUS
  Filled 2024-07-05 (×2): qty 100

## 2024-07-05 MED ORDER — SUGAMMADEX SODIUM 200 MG/2ML IV SOLN
INTRAVENOUS | Status: DC | PRN
  Administered 2024-07-05: 200 mg via INTRAVENOUS

## 2024-07-05 MED ORDER — ESMOLOL HCL 100 MG/10ML IV SOLN
INTRAVENOUS | Status: DC | PRN
  Administered 2024-07-05: 40 mg via INTRAVENOUS
  Administered 2024-07-05: 30 mg via INTRAVENOUS

## 2024-07-05 MED ORDER — METOCLOPRAMIDE HCL 5 MG/ML IJ SOLN: 5.0000 mg | Freq: Three times a day (TID) | INTRAMUSCULAR | Status: DC | PRN

## 2024-07-05 MED ORDER — SUGAMMADEX SODIUM 200 MG/2ML IV SOLN
INTRAVENOUS | Status: AC
  Filled 2024-07-05: qty 2

## 2024-07-05 MED ORDER — LACTATED RINGERS IV SOLN: INTRAVENOUS | Status: DC

## 2024-07-05 MED ORDER — DEXAMETHASONE SOD PHOSPHATE PF 10 MG/ML IJ SOLN
INTRAMUSCULAR | Status: DC | PRN
  Administered 2024-07-05: 4 mg via INTRAVENOUS

## 2024-07-05 MED ORDER — HYDROMORPHONE HCL 1 MG/ML IJ SOLN
0.2500 mg | INTRAMUSCULAR | Status: DC | PRN
  Administered 2024-07-05: 0.25 mg via INTRAVENOUS

## 2024-07-05 MED ORDER — GUAIFENESIN 100 MG/5ML PO LIQD: 5.0000 mL | ORAL | Status: DC | PRN

## 2024-07-05 MED ORDER — TRANEXAMIC ACID-NACL 1000-0.7 MG/100ML-% IV SOLN
1000.0000 mg | Freq: Once | INTRAVENOUS | Status: AC
  Administered 2024-07-05: 1000 mg via INTRAVENOUS
  Filled 2024-07-05: qty 100

## 2024-07-05 MED ORDER — DROPERIDOL 2.5 MG/ML IJ SOLN: 0.6250 mg | Freq: Once | INTRAMUSCULAR | Status: DC | PRN

## 2024-07-05 MED ORDER — PROPOFOL 10 MG/ML IV BOLUS
INTRAVENOUS | Status: AC
  Filled 2024-07-05: qty 20

## 2024-07-05 MED ORDER — PHENYLEPHRINE 80 MCG/ML (10ML) SYRINGE FOR IV PUSH (FOR BLOOD PRESSURE SUPPORT)
PREFILLED_SYRINGE | INTRAVENOUS | Status: DC | PRN
  Administered 2024-07-05 (×2): 80 ug via INTRAVENOUS

## 2024-07-05 MED ORDER — CHLORHEXIDINE GLUCONATE 4 % EX SOLN: 60.0000 mL | Freq: Once | CUTANEOUS | Status: DC

## 2024-07-05 MED ORDER — HYDRALAZINE HCL 20 MG/ML IJ SOLN
10.0000 mg | INTRAMUSCULAR | Status: DC | PRN
  Administered 2024-07-08 – 2024-07-09 (×2): 10 mg via INTRAVENOUS
  Filled 2024-07-05 (×2): qty 1

## 2024-07-05 MED ORDER — 0.9 % SODIUM CHLORIDE (POUR BTL) OPTIME
TOPICAL | Status: DC | PRN
  Administered 2024-07-05: 1000 mL

## 2024-07-05 MED ORDER — HYDROMORPHONE HCL 1 MG/ML IJ SOLN
INTRAMUSCULAR | Status: AC
  Filled 2024-07-05: qty 1

## 2024-07-05 MED ORDER — ENOXAPARIN SODIUM 40 MG/0.4ML IJ SOSY
40.0000 mg | PREFILLED_SYRINGE | INTRAMUSCULAR | Status: DC
  Administered 2024-07-06 – 2024-07-10 (×5): 40 mg via SUBCUTANEOUS
  Filled 2024-07-05 (×5): qty 0.4

## 2024-07-05 MED ORDER — LIDOCAINE 2% (20 MG/ML) 5 ML SYRINGE
INTRAMUSCULAR | Status: DC | PRN
  Administered 2024-07-05: 100 mg via INTRAVENOUS

## 2024-07-05 SURGICAL SUPPLY — 34 items
BAG COUNTER SPONGE SURGICOUNT (BAG) IMPLANT
BAG ZIPLOCK 12X15 (MISCELLANEOUS) ×1 IMPLANT
BIT DRILL 4.0X280 (BIT) IMPLANT
COVER PERINEAL POST (MISCELLANEOUS) ×1 IMPLANT
COVER SURGICAL LIGHT HANDLE (MISCELLANEOUS) ×1 IMPLANT
DERMABOND ADVANCED .7 DNX12 (GAUZE/BANDAGES/DRESSINGS) ×1 IMPLANT
DRAPE STERI IOBAN 125X83 (DRAPES) ×1 IMPLANT
DRESSING MEPILEX FLEX 4X4 (GAUZE/BANDAGES/DRESSINGS) ×3 IMPLANT
DRSG AQUACEL AG ADV 3.5X 6 (GAUZE/BANDAGES/DRESSINGS) ×2 IMPLANT
DURAPREP 26ML APPLICATOR (WOUND CARE) ×1 IMPLANT
ELECT REM PT RETURN 15FT ADLT (MISCELLANEOUS) IMPLANT
FACESHIELD WRAPAROUND OR TEAM (MASK) ×1 IMPLANT
GLOVE BIO SURGEON STRL SZ8 (GLOVE) ×1 IMPLANT
GLOVE BIOGEL PI IND STRL 8 (GLOVE) ×1 IMPLANT
GOWN STRL SURGICAL XL XLNG (GOWN DISPOSABLE) ×1 IMPLANT
KIT BASIN OR (CUSTOM PROCEDURE TRAY) ×1 IMPLANT
KIT TURNOVER KIT A (KITS) ×1 IMPLANT
MANIFOLD NEPTUNE II (INSTRUMENTS) ×1 IMPLANT
NAIL TROCH SHORT 11X20 130D (Nail) IMPLANT
PACK GENERAL/GYN (CUSTOM PROCEDURE TRAY) ×1 IMPLANT
PENCIL SMOKE EVACUATOR (MISCELLANEOUS) ×1 IMPLANT
PIN GUIDE THRD AR 3.2X330 (PIN) IMPLANT
PROTECTOR NERVE ULNAR (MISCELLANEOUS) ×1 IMPLANT
SCREW CORT CAPTR 5X34 (Screw) IMPLANT
SCREW TELESC LAG 10.5X105 LT (Screw) IMPLANT
STRIP CLOSURE SKIN 1/2X4 (GAUZE/BANDAGES/DRESSINGS) ×1 IMPLANT
SUT MNCRL AB 3-0 PS2 18 (SUTURE) ×1 IMPLANT
SUT MNCRL AB 4-0 PS2 18 (SUTURE) ×1 IMPLANT
SUT MON AB 2-0 CT1 36 (SUTURE) ×1 IMPLANT
SUT VIC AB 0 CT1 36 (SUTURE) ×1 IMPLANT
SUT VIC AB 1 CT1 27XBRD ANBCTR (SUTURE) ×1 IMPLANT
SUT VIC AB 2-0 CT1 TAPERPNT 27 (SUTURE) ×1 IMPLANT
TOOL ACTIVATION TELES LAG SCRW (INSTRUMENTS) IMPLANT
TOWEL OR DSP ST BLU DLX 10/PK (DISPOSABLE) ×1 IMPLANT

## 2024-07-05 NOTE — Anesthesia Postprocedure Evaluation (Signed)
 Anesthesia Post Note  Patient: Terri Ortega  Procedure(s) Performed: FIXATION, FRACTURE, INTERTROCHANTERIC, WITH INTRAMEDULLARY ROD (Right)     Patient location during evaluation: PACU Anesthesia Type: General Level of consciousness: sedated and patient cooperative Pain management: pain level controlled Vital Signs Assessment: post-procedure vital signs reviewed and stable Respiratory status: spontaneous breathing Cardiovascular status: stable Anesthetic complications: no   No notable events documented.  Last Vitals:  Vitals:   07/05/24 2133 07/05/24 2336  BP: (!) 170/51 (!) 160/87  Pulse: 73 72  Resp: 18 14  Temp: 37.6 C 37.5 C  SpO2: 97% 100%    Last Pain:  Vitals:   07/05/24 2210  TempSrc:   PainSc: Asleep                 Norleen Pope

## 2024-07-05 NOTE — Anesthesia Preprocedure Evaluation (Addendum)
 Anesthesia Evaluation  Patient identified by MRN, date of birth, ID band Patient awake and Patient confused    Reviewed: Allergy & Precautions, NPO status , Patient's Chart, lab work & pertinent test results  History of Anesthesia Complications Negative for: history of anesthetic complications  Airway Mallampati: II  TM Distance: >3 FB Neck ROM: Full    Dental  (+) Dental Advisory Given, Teeth Intact   Pulmonary shortness of breath, pneumonia   Pulmonary exam normal breath sounds clear to auscultation       Cardiovascular hypertension, Pt. on medications +CHF  + Valvular Problems/Murmurs AI  Rhythm:Regular Rate:Normal  Echo 03/2024 1. Left ventricular ejection fraction, by estimation, is 60 to 65%. The left  ventricle has normal function. The left ventricle has no regional wall  motion abnormalities. There is mild left ventricular hypertrophy. Left  ventricular diastolic parameters were normal. The average left ventricular  global longitudinal strain is -22.8 %. The global longitudinal strain is normal.   2. Right ventricular systolic function is normal. The right ventricular size is  normal.   3. The mitral valve is abnormal. Trivial mitral valve regurgitation. No  evidence of mitral stenosis.   4. The aortic valve is tricuspid. There is mild calcification of the aortic  valve. There is mild thickening of the aortic valve. Aortic valve  regurgitation is mild to moderate. Aortic valve sclerosis is present, with  no evidence of aortic valve stenosis.   5. The inferior vena cava is normal in size with greater than 50%  respiratory variability, suggesting right atrial pressure of 3 mmHg.     '17 TTE - EF 55% to 60%. Grade 1 diastolic dysfunction. Mild AI. PASP was mildly increased. PA peak pressure: 39 mm Hg (S).     Neuro/Psych  PSYCHIATRIC DISORDERS     Dementia  Mild cognitive impairment   Neuromuscular disease     GI/Hepatic negative GI ROS, Neg liver ROS,,,  Endo/Other  Hypothyroidism   Obesity Graves disease   Renal/GU CRFRenal disease     Musculoskeletal  (+) Arthritis ,    Abdominal  (+) + obese  Peds  Hematology  (+) Blood dyscrasia, anemia   Anesthesia Other Findings   Reproductive/Obstetrics  Hx uterine cancer s/p hysterectomy                               Anesthesia Physical Anesthesia Plan  ASA: 3  Anesthesia Plan: General   Post-op Pain Management: Ofirmev  IV (intra-op)*   Induction:   PONV Risk Score and Plan: 4 or greater and Treatment may vary due to age or medical condition, Ondansetron  and Dexamethasone   Airway Management Planned: Oral ETT  Additional Equipment: None  Intra-op Plan:   Post-operative Plan: Extubation in OR  Informed Consent: I have reviewed the patients History and Physical, chart, labs and discussed the procedure including the risks, benefits and alternatives for the proposed anesthesia with the patient or authorized representative who has indicated his/her understanding and acceptance.     Dental advisory given  Plan Discussed with: CRNA  Anesthesia Plan Comments:          Anesthesia Quick Evaluation

## 2024-07-05 NOTE — Anesthesia Procedure Notes (Signed)
 Procedure Name: Intubation Date/Time: 07/05/2024 5:54 PM  Performed by: Gladis Honey, CRNAPre-anesthesia Checklist: Patient identified, Emergency Drugs available, Suction available and Patient being monitored Patient Re-evaluated:Patient Re-evaluated prior to induction Oxygen Delivery Method: Circle System Utilized Preoxygenation: Pre-oxygenation with 100% oxygen Induction Type: IV induction Ventilation: Mask ventilation without difficulty Laryngoscope Size: Glidescope and 3 Grade View: Grade I Tube type: Oral Tube size: 7.0 mm Number of attempts: 2 Airway Equipment and Method: Stylet and Oral airway Placement Confirmation: ETT inserted through vocal cords under direct vision, positive ETCO2 and breath sounds checked- equal and bilateral Secured at: 21 cm Tube secured with: Tape Dental Injury: Teeth and Oropharynx as per pre-operative assessment  Comments: First attempt with miller 2, grade 2b view, unable to pass ETT. Intubated with glidescope easily.

## 2024-07-05 NOTE — Plan of Care (Signed)

## 2024-07-05 NOTE — Progress Notes (Signed)
 PROGRESS NOTE    Terri Ortega  FMW:[id number] DOB: [name] 23, 1941 DOA: 07/04/2024 PCP: Okey Carlin Redbird, MD    Brief Narrative:   83 y.o. female with medical history significant for chronic HFpEF, hypertension, hypothyroidism, who presents to the ER after a mechanical fall.  In the ER, hypertensive with SBP in the 200s.  R knee x-ray without evidence of hardware loosening or acute fracture.  Right hip x-ray shows an acute right femoral intratrochanteric fracture.  Orthopedic consulted planning on surgical intervention 12/3.  Assessment & Plan:    Right hip fracture secondary to mechanical fall, POA Orthopedic planning on surgical intervention today Thereafter postop management including DVT prophylaxis, pain management wound care, weightbearing precautions and follow-up per orthopedic   Leukocytosis, reactive in the setting of hip fracture Reactive.   Hypertension On losartan .  IV as needed   CKD 3B Creatinine around baseline 1.12   Chronic HFpEF, EF 65% Euvolemic on exam Last 2D echo done on 03/27/2024 revealed LVEF 60 to 65% Monitor strict I's and O's and daily weight   Hypothyroidism Continue Synthroid   DVT prophylaxis: SCDs Start: 07/04/24 2123    Code Status: Full Code Family Communication:   Status is: Inpatient Remains inpatient appropriate because: On going surgical management.    PT Follow up Recs:   Subjective:  Seen at bedside does not any complaints.  Awaiting her surgery later today  Examination:  General exam: Appears calm and comfortable  Respiratory system: Clear to auscultation. Respiratory effort normal. Cardiovascular system: S1 & S2 heard, RRR. No JVD, murmurs, rubs, gallops or clicks. No pedal edema. Gastrointestinal system: Abdomen is nondistended, soft and nontender. No organomegaly or masses felt. Normal bowel sounds heard. Central nervous system: Alert and oriented. No focal neurological deficits. Extremities: Symmetric 5 x 5  power.  Limited range of motion of her right lower extremity Skin: No rashes, lesions or ulcers Psychiatry: Judgement and insight appear normal. Mood & affect appropriate.                Diet Orders (From admission, onward)     Start     Ordered   07/05/24 0001  Diet NPO time specified Except for: Sips with Meds  Diet effective midnight       Question:  Except for  Answer:  Sips with Meds   07/04/24 2222            Objective: Vitals:   07/04/24 2318 07/05/24 0409 07/05/24 0500 07/05/24 0914  BP:  (!) 152/58  (!) 133/51  Pulse:  66  60  Resp:  16  15  Temp:  98 F (36.7 C)  98.2 F (36.8 C)  TempSrc:  Oral    SpO2:  90%  94%  Weight: 85.7 kg  85.7 kg   Height: 5' 0.5 (1.537 m)       Intake/Output Summary (Last 24 hours) at 07/05/2024 1032 Last data filed at 07/05/2024 1000 Gross per 24 hour  Intake 459.67 ml  Output 250 ml  Net 209.67 ml   Filed Weights   07/04/24 2318 07/05/24 0500  Weight: 85.7 kg 85.7 kg    Scheduled Meds:  acetaminophen   975 mg Oral Q8H   Chlorhexidine  Gluconate Cloth  6 each Topical Daily   levothyroxine   100 mcg Oral Q0600   losartan   25 mg Oral Daily   Continuous Infusions:  lactated ringers  40 mL/hr at 07/04/24 2323    Nutritional status     Body mass index is 36.29 kg/m.  Data Reviewed:   CBC: Recent Labs  Lab 07/04/24 2004 07/05/24 0335  WBC 13.9* 12.5*  NEUTROABS 12.1* 10.1*  HGB 13.5 11.6*  HCT 41.2 35.6*  MCV 92.4 92.0  PLT 170 167   Basic Metabolic Panel: Recent Labs  Lab 07/04/24 2004 07/05/24 0335  NA 143 143  K 3.6 3.7  CL 104 105  CO2 26 28  GLUCOSE 129* 137*  BUN 26* 26*  CREATININE 1.14* 1.12*  CALCIUM  9.9 9.3  MG  --  2.1  PHOS  --  4.2   GFR: Estimated Creatinine Clearance: 37.4 mL/min (A) (by C-G formula based on SCr of 1.12 mg/dL (H)). Liver Function Tests: No results for input(s): AST, ALT, ALKPHOS, BILITOT, PROT, ALBUMIN in the last 168 hours. No results for  input(s): LIPASE, AMYLASE in the last 168 hours. No results for input(s): AMMONIA in the last 168 hours. Coagulation Profile: Recent Labs  Lab 07/04/24 2004  INR 1.0   Cardiac Enzymes: No results for input(s): CKTOTAL, CKMB, CKMBINDEX, TROPONINI in the last 168 hours. BNP (last 3 results) Recent Labs    01/13/24 1020  PROBNP 628   HbA1C: No results for input(s): HGBA1C in the last 72 hours. CBG: No results for input(s): GLUCAP in the last 168 hours. Lipid Profile: No results for input(s): CHOL, HDL, LDLCALC, TRIG, CHOLHDL, LDLDIRECT in the last 72 hours. Thyroid  Function Tests: No results for input(s): TSH, T4TOTAL, FREET4, T3FREE, THYROIDAB in the last 72 hours. Anemia Panel: No results for input(s): VITAMINB12, FOLATE, FERRITIN, TIBC, IRON, RETICCTPCT in the last 72 hours. Sepsis Labs: No results for input(s): PROCALCITON, LATICACIDVEN in the last 168 hours.  Recent Results (from the past 240 hours)  Surgical PCR screen     Status: None   Collection Time: 07/04/24 11:52 PM   Specimen: Nasal Mucosa; Nasal Swab  Result Value Ref Range Status   MRSA, PCR NEGATIVE NEGATIVE Final   Staphylococcus aureus NEGATIVE NEGATIVE Final    Comment: (NOTE) The Xpert SA Assay (FDA approved for NASAL specimens in patients 48 years of age and older), is one component of a comprehensive surveillance program. It is not intended to diagnose infection nor to guide or monitor treatment. Performed at [facility], [address] [place], [place] [address]          Radiology Studies: DG Knee 2 Views Right Result Date: 07/04/2024 CLINICAL DATA:  Fall, knee pain EXAM: RIGHT KNEE - 1-2 VIEW COMPARISON:  None Available. FINDINGS: Right knee total arthroplasty present. No evidence for hardware loosening or acute fracture. Joint spaces are maintained and alignment is anatomic. Soft tissues are within normal limits.  IMPRESSION: Right knee total arthroplasty without evidence for hardware loosening or acute fracture. Electronically Signed   By: Greig Pique M.D.   On: 07/04/2024 19:50   DG Hip Unilat  With Pelvis 2-3 Views Right Result Date: 07/04/2024 CLINICAL DATA:  Pain after fall EXAM: DG HIP (WITH OR WITHOUT PELVIS) 2-3V RIGHT COMPARISON:  None Available. FINDINGS: There is an acute right femoral intratrochanteric fracture which is nondisplaced. The lesser trochanter is a free fracture fragment displaced medially 1 cm. There is no dislocation. Left hip arthroplasty appears in anatomic alignment. There are degenerative changes of the lumbar spine. IMPRESSION: Acute right femoral intratrochanteric fracture. Electronically Signed   By: Greig Pique M.D.   On: 07/04/2024 19:41           LOS: 1 day   Time spent= 35 mins  Burgess JAYSON Dare, MD [facility]  If 7PM-7AM, please contact night-coverage  07/05/2024, 10:32 AM

## 2024-07-05 NOTE — Progress Notes (Signed)
    PROCEDURAL EXPEDITER PROGRESS NOTE  Patient Name: Terri Ortega  DOB:05/16/1941 Date of Admission: 07/04/2024  Date of Assessment:07/05/24   -------------------------------------------------------------------------------------------------------------------   Brief clinical summary: P tis a 83 yr old female with Hx of HTN, hypothyroidism and HFpEF. She fell and is having surgery today for a Fx right hip and having an intramedullary rod placed    Orders in place:  Yes   Communication with surgical team if no orders: n/a  Labs, test, and orders reviewed: yes  Requires surgical clearance:  No  What type of clearance: n/a  Clearance received: n/a  Barriers noted:none noted   Intervention provided by [facility] team: n/a  Barrier resolved:  not applicable   -------------------------------------------------------------------------------------------------------------------  [employer], Ronal DELENA Bald Please contact us  directly via secure chat (search for [facility]) or by calling us  at [phone] [facility]).

## 2024-07-05 NOTE — TOC Initial Note (Addendum)
 Transition of Care [facility]) - Initial/Assessment Note    Patient Details  Name: Terri Ortega MRN: [id number] Date of Birth: 1941/05/12  [facility]) CM/SW Contact:    [name], RN Phone Number: 07/05/2024, 11:41 AM  Clinical Narrative:     Met with patient at bedside to introduce role of INPT CM/NCM and review for dc planning, PCP and insurance on file, patint reports she resides with her spouse, at baseline is independent with ADL's and functional mobility with a RW as needed. OR planned for today. INPT CM will follow for dc planning.               Expected Discharge Plan: Home w Home Health Services Barriers to Discharge: Continued Medical Work up   Patient Goals and CMS Choice Patient states their goals for this hospitalization and ongoing recovery are:: return home          Expected Discharge Plan and Services       Living arrangements for the past 2 months: Single Family Home                                      Prior Living Arrangements/Services Living arrangements for the past 2 months: Single Family Home Lives with:: Spouse Patient language and need for interpreter reviewed:: Yes Do you feel safe going back to the place where you live?: Yes      Need for Family Participation in Patient Care: Yes (Comment) Care giver support system in place?: Yes (comment) Current home services: DME (RW) Criminal Activity/Legal Involvement Pertinent to Current Situation/Hospitalization: No - Comment as needed  Activities of Daily Living   ADL Screening (condition at time of admission) Independently performs ADLs?: Yes (appropriate for developmental age) Is the patient deaf or have difficulty hearing?: No Does the patient have difficulty seeing, even when wearing glasses/contacts?: No Does the patient have difficulty concentrating, remembering, or making decisions?: Yes  Permission Sought/Granted                  Emotional  Assessment Appearance:: Appears stated age Attitude/Demeanor/Rapport: Engaged Affect (typically observed): Accepting Orientation: : Oriented to Self, Oriented to Place, Oriented to  Time, Oriented to Situation Alcohol  / Substance Use: Not Applicable Psych Involvement: No (comment)  Admission diagnosis:  Fall, initial encounter [W19.XXXA] Closed right hip fracture, initial encounter (HCC) [S72.001A] Laceration of right palm, initial encounter [S61.411A] S/P right hip fracture [Z87.81] Patient Active Problem List   Diagnosis Date Noted   S/P right hip fracture 07/04/2024   Leg swelling 01/12/2024   Nausea 04/27/2022   Blood loss anemia 04/24/2022   Slow transit constipation 04/24/2022   Subcapital fracture of femur, left, closed, initial encounter (HCC) 04/15/2022   Fall at home, initial encounter 04/15/2022   CKD (chronic kidney disease) stage 3, GFR 30-59 ml/min (HCC) 04/15/2022   Hypertensive urgency 04/15/2022   Osteoporosis 04/15/2022   Aortic valve disease 07/10/2021   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   Orthostatic dizziness 11/14/2020   Educated about COVID-19 virus infection 12/21/2019   Mild cognitive impairment 09/06/2019   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   Preop cardiovascular exam 12/20/2018   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   Uterine cancer (HCC) 03/28/2018   Arthralgia of hip or thigh 03/28/2018   Benign essential HTN 03/28/2018  Overweight 03/28/2018   Heart disease 11/09/2014   Leukocytosis 10/31/2014   Heart failure with improved ejection fraction (HFimpEF) (HCC) 10/30/2014   Hypothyroidism 10/30/2014   Chest pain at rest 10/30/2014   Chest pain 10/30/2014   Endometrial cancer (HCC) 01/30/2014   Dyspnea 01/11/2014   Edema 01/11/2014   Low back pain with right-sided sciatica 12/13/2013   Degeneration of lumbar or  lumbosacral intervertebral disc 12/13/2013   Sciatica 12/13/2013   PCP:  Okey Carlin Redbird, MD Pharmacy:   [facility] [address], [place] - [address] [place]  [address] Phone: [phone] Fax: [phone]     Social Drivers of Health (SDOH) Social History: SDOH Screenings   Food Insecurity: No Food Insecurity (07/04/2024)  Housing: Low Risk  (07/04/2024)  Transportation Needs: No Transportation Needs (07/04/2024)  Utilities: Not At Risk (07/04/2024)  Social Connections: Moderately Integrated (07/04/2024)  Tobacco Use: Low Risk  (07/04/2024)   SDOH Interventions:     Readmission Risk Interventions    07/05/2024   11:40 AM  Readmission Risk Prevention Plan  Transportation Screening Complete  PCP or Specialist Appt within 5-7 Days Complete  Home Care Screening Complete  Medication Review (RN CM) Complete

## 2024-07-05 NOTE — Transfer of Care (Signed)
 Immediate Anesthesia Transfer of Care Note  Patient: Terri Ortega  Procedure(s) Performed: FIXATION, FRACTURE, INTERTROCHANTERIC, WITH INTRAMEDULLARY ROD (Right)  Patient Location: PACU  Anesthesia Type:General  Level of Consciousness: awake  Airway & Oxygen Therapy: Patient Spontanous Breathing and Patient connected to face mask oxygen  Post-op Assessment: Report given to RN and Post -op Vital signs reviewed and stable  Post vital signs: Reviewed and stable  Last Vitals:  Vitals Value Taken Time  BP 168/86 07/05/24 19:32  Temp    Pulse 118 07/05/24 19:45  Resp 26 07/05/24 19:47  SpO2 80 % 07/05/24 19:45  Vitals shown include unfiled device data.  Last Pain:  Vitals:   07/05/24 1426  TempSrc: Oral  PainSc: 0-No pain      Patients Stated Pain Goal: 4 (07/05/24 1426)  Complications: No notable events documented.

## 2024-07-05 NOTE — Hospital Course (Addendum)
 Brief Narrative:   83 y.o. female with medical history significant for chronic HFpEF, hypertension, hypothyroidism, who presents to the ER after a mechanical fall.  In the ER, hypertensive with SBP in the 200s.  R knee x-ray without evidence of hardware loosening or acute fracture.  Right hip x-ray shows an acute right femoral intratrochanteric fracture.  Underwent IM implant by orthopedic on 12/3.  Assessment & Plan:    Right hip fracture secondary to mechanical fall, POA Status post ORIF/IM implant 12/3 Postop management including DVT prophylaxis, pain management wound care, weightbearing precautions and follow-up per orthopedic   Leukocytosis, reactive in the setting of hip fracture Reactive.   Hypertension On losartan.  IV as needed   CKD 3B Creatinine around baseline 1.12   Chronic HFpEF, EF 65% Euvolemic on exam Last 2D echo done on 03/27/2024 revealed LVEF 60 to 65% Monitor strict I's and O's and daily weight   Hypothyroidism Continue Synthroid   DVT prophylaxis: SCDs Start: 07/04/24 2123    Code Status: Full Code Family Communication:   Status is: Inpatient Remains inpatient appropriate because: On going surgical management.    PT Follow up Recs:   Subjective: IntraOp had an episode of SVT/atrial fibrillation, now when normal sinus rhythm. This morning eating breakfast doing okay no complaints Examination:  General exam: Appears calm and comfortable  Respiratory system: Clear to auscultation. Respiratory effort normal. Cardiovascular system: S1 & S2 heard, RRR. No JVD, murmurs, rubs, gallops or clicks. No pedal edema. Gastrointestinal system: Abdomen is nondistended, soft and nontender. No organomegaly or masses felt. Normal bowel sounds heard. Central nervous system: Alert and oriented. No focal neurological deficits. Extremities: Symmetric 5 x 5 power.  Limited range of motion of her right lower extremity Skin: No rashes, lesions or ulcers Psychiatry:  Judgement and insight appear normal. Mood & affect appropriate.

## 2024-07-05 NOTE — Op Note (Signed)
 Date of Surgery: 07/05/2024  INDICATIONS: Ms. Longo is a 83 y.o.-year-old female who sustained a right intertrochanteric femur fracture. The risks and benefits of the procedure discussed with the patient and family prior to the procedure and all questions were answered; consent was obtained.   PREOPERATIVE DIAGNOSIS: right intertrochanteric femur fracture   POSTOPERATIVE DIAGNOSIS: Same   PROCEDURE: Treatment of intertrochanteric fracture with intramedullary implant. CPT 72754   SURGEON: Rankin Pizza, M.D.   Assistants: None  ANESTHESIA: general   IV FLUIDS AND URINE: See anesthesia record   ESTIMATED BLOOD LOSS: 20 cc  IMPLANTS:  Arthrex cephalomedullary nail 11 x 200 130 deg, 105 mm cephalomedullary screw, 34 distal interlocking screw  DRAINS: None.     COMPLICATIONS: None.   DESCRIPTION OF PROCEDURE: The patient was brought to the operating room and placed supine on the operating table. The patient's leg had been signed prior to the procedure. The patient had the anesthesia placed by the anesthesiologist. The prep verification and incision time-outs were performed to confirm that this was the correct patient, site, side and location. The patient had an SCD on the opposite lower extremity. The patient did receive antibiotics prior to the incision and was re-dosed during the procedure as needed at indicated intervals. The patient was positioned on the fracture table with the table in traction and internal rotation to reduce the hip. The well leg was placed in a scissor position and all bony prominences were well-padded. The patient had the lower extremity prepped and draped in the standard surgical fashion. The incision was made 4 finger breadths superior to the greater trochanter. A guide pin was inserted into the tip of the greater trochanter under fluoroscopic guidance. An opening reamer was used to gain access to the femoral canal. The nail length was measured and inserted down  the femoral canal to its proper depth. The appropriate version of insertion for the lag screw was found under fluoroscopy. There was some residual displacement of the femoral neck and a counter incision was made an a blunt bone hook was passed to help effect reduction.  A pin was inserted up the femoral neck through the jig. The length of the lag screw was then measured and the path was pre drilled. The lag screw was inserted as near to center-center in the head as possible. The leg was taken out of traction, then the compression screw was used to compress across the fracture. Compression was visualized on serial xrays.   We next turned our attention to the distal interlocking screw.  This was placed through the drill guide of the nail inserter.  A small incision was made overlying the lateral thigh at the screw site, and a tonsil was used to disect down to bone.  A drill pass was made through the jig and across the nail through both cortices.  This was measured, and the appropriate screw was placed under hand power and found to have good bite.    The wound was copiously irrigated with saline and  then the deep layer was closed using 0 vicryl, the subcutaneous layer closed with 2-0 monocryl and the skin was reapproximated with 3-0 monocryl running subcuticularly and steri strips. The wounds were cleaned and dried a final time and a sterile dressing was placed.  The patient was then awakened from anesthesia and taken to the recovery room in stable condition. All counts were correct at the end of the case.   POSTOPERATIVE PLAN: The patient will be  weight bearing as tolerated and will return in 2 weeks for staple removal and the patient will receive DVT prophylaxis based on other medications, activity level, and risk ratio of bleeding to thrombosis.     Rankin Pizza, MD [facility] 7:23 PM

## 2024-07-06 ENCOUNTER — Inpatient Hospital Stay (HOSPITAL_COMMUNITY)

## 2024-07-06 DIAGNOSIS — Z8781 Personal history of (healed) traumatic fracture: Secondary | ICD-10-CM | POA: Diagnosis not present

## 2024-07-06 LAB — AMMONIA: Ammonia: 17 umol/L (ref 9–35)

## 2024-07-06 LAB — PHOSPHORUS: Phosphorus: 2.8 mg/dL (ref 2.5–4.6)

## 2024-07-06 LAB — BASIC METABOLIC PANEL WITH GFR
Anion gap: 9 (ref 5–15)
BUN: 22 mg/dL (ref 8–23)
CO2: 27 mmol/L (ref 22–32)
Calcium: 8.7 mg/dL — ABNORMAL LOW (ref 8.9–10.3)
Chloride: 106 mmol/L (ref 98–111)
Creatinine, Ser: 1.06 mg/dL — ABNORMAL HIGH (ref 0.44–1.00)
GFR, Estimated: 52 mL/min — ABNORMAL LOW (ref >60)
Glucose, Bld: 161 mg/dL — ABNORMAL HIGH (ref 70–99)
Potassium: 4.1 mmol/L (ref 3.5–5.1)
Sodium: 142 mmol/L (ref 135–145)

## 2024-07-06 LAB — CBC
HCT: 34.2 % — ABNORMAL LOW (ref 36.0–46.0)
Hemoglobin: 11.3 g/dL — ABNORMAL LOW (ref 12.0–15.0)
MCH: 30.9 pg (ref 26.0–34.0)
MCHC: 33 g/dL (ref 30.0–36.0)
MCV: 93.4 fL (ref 80.0–100.0)
Platelets: 146 K/uL — ABNORMAL LOW (ref 150–400)
RBC: 3.66 MIL/uL — ABNORMAL LOW (ref 3.87–5.11)
RDW: 13 % (ref 11.5–15.5)
WBC: 12.2 K/uL — ABNORMAL HIGH (ref 4.0–10.5)
nRBC: 0 % (ref 0.0–0.2)

## 2024-07-06 LAB — TSH: TSH: 0.477 u[IU]/mL (ref 0.350–4.500)

## 2024-07-06 LAB — GLUCOSE, CAPILLARY
Glucose-Capillary: 149 mg/dL — ABNORMAL HIGH (ref 70–99)
Glucose-Capillary: 184 mg/dL — ABNORMAL HIGH (ref 70–99)
Glucose-Capillary: 157 mg/dL — ABNORMAL HIGH (ref 70–99)

## 2024-07-06 LAB — MAGNESIUM: Magnesium: 1.9 mg/dL (ref 1.7–2.4)

## 2024-07-06 NOTE — Evaluation (Signed)
 Physical Therapy Evaluation Patient Details Name: Terri Ortega MRN: [id number] DOB: Feb 03, 1941 Today's Date: 07/06/2024  History of Present Illness  Patient is an 83 y/o female admitted 07/04/24 after fall with R hip IT fx.  Underwent IM nail on 07/05/24.  PMH positive for CHF, HTN, uterine cancer s/p surgery, XRT, hypothyroidism, L THA, R TKA x 2 and mild dementia.  Clinical Impression  Patient presents with decreased mobility due to pain, decreased strength, decreased activity tolerance and decreased balance.  Previously patient ambulatory though full details unknown as no family available and pt unable to state.  Currently needing +2 A for up to EOB and to chair via Stedy lift.  Patient will benefit from skilled PT in the acute setting and from post-acute inpatient rehab (<3 hours/day) prior to d/c home.         If plan is discharge home, recommend the following: Two people to help with bathing/dressing/bathroom;A little help with bathing/dressing/bathroom;Assist for transportation;Direct supervision/assist for medications management;Supervision due to cognitive status   Can travel by private vehicle   No    Equipment Recommendations None recommended by PT  Recommendations for Other Services       Functional Status Assessment Patient has had a recent decline in their functional status and demonstrates the ability to make significant improvements in function in a reasonable and predictable amount of time.     Precautions / Restrictions Precautions Precautions: Fall Restrictions Weight Bearing Restrictions Per Provider Order: Yes RLE Weight Bearing Per Provider Order: Weight bearing as tolerated      Mobility  Bed Mobility Overal bed mobility: Needs Assistance Bed Mobility: Supine to Sit     Supine to sit: Mod assist, +2 for physical assistance     General bed mobility comments: assist to scoot hips with bed pad, cues for using rail and A for R LE off EOB and to lift  trunk    Transfers Overall transfer level: Needs assistance Equipment used: Rolling walker (2 wheels) Transfers: Bed to chair/wheelchair/BSC, Sit to/from Stand Sit to Stand: Mod assist, +2 physical assistance           General transfer comment: up to RW with mod A lifting help of 2 though pt unable to sustain and flexed needing to sit, used Camie Ip to pivot to recliner    Ambulation/Gait                  Stairs            Wheelchair Mobility     Tilt Bed    Modified Rankin (Stroke Patients Only)       Balance Overall balance assessment: Needs assistance   Sitting balance-Leahy Scale: Poor Sitting balance - Comments: initially leaning L progressing over time wtih balance and tolerance   Standing balance support: Bilateral upper extremity supported Standing balance-Leahy Scale: Poor                               Pertinent Vitals/Pain Pain Assessment Pain Assessment: Faces Faces Pain Scale: Hurts whole lot Pain Location: R hip with movement to EOB and sit to stand Pain Descriptors / Indicators: Grimacing, Guarding, Moaning Pain Intervention(s): Monitored during session, Limited activity within patient's tolerance, Repositioned    Home Living Family/patient expects to be discharged to:: Skilled nursing facility Living Arrangements: Spouse/significant other  Prior Function Prior Level of Function : Patient poor historian/Family not available             Mobility Comments: pt poor historian       Extremity/Trunk Assessment   Upper Extremity Assessment Upper Extremity Assessment: Overall WFL for tasks assessed    Lower Extremity Assessment Lower Extremity Assessment: RLE deficits/detail RLE Deficits / Details: AAROM/PROM limited hip flexion and knee flexion in supine, flexion seated EOB in knee to about 80, ankle AAROM [facility]       Communication   Communication Communication: No apparent  difficulties Factors Affecting Communication: Other (comment) (limited verbal responses)    Cognition Arousal: Alert Behavior During Therapy: Flat affect   PT - Cognitive impairments: History of cognitive impairments, No family/caregiver present to determine baseline, Problem solving, Attention, Sequencing                       PT - Cognition Comments: flat affect, not answering questions, follows commands intermittently with increased time, needs A for initiation and sequencing and attention Following commands: Impaired Following commands impaired: Follows one step commands inconsistently, Follows one step commands with increased time     Cueing       General Comments General comments (skin integrity, edema, etc.): BP 96/48 seated EOB pt mild c/o dizzines, stable HR/SpO2 on RA    Exercises Total Joint Exercises Ankle Circles/Pumps: AROM, AAROM, 10 reps, Supine, Both Heel Slides: AAROM, Supine   Assessment/Plan    PT Assessment Patient needs continued PT services  PT Problem List Decreased strength;Decreased range of motion;Decreased activity tolerance;Decreased balance;Decreased mobility;Pain;Decreased safety awareness       PT Treatment Interventions DME instruction;Gait training;Patient/family education;Functional mobility training;Therapeutic activities;Therapeutic exercise;Balance training    PT Goals (Current goals can be found in the Care Plan section)  Acute Rehab PT Goals Patient Stated Goal: unable to state PT Goal Formulation: Patient unable to participate in goal setting Time For Goal Achievement: 07/20/24 Potential to Achieve Goals: Fair    Frequency Min 2X/week     Co-evaluation               AM-PAC PT 6 Clicks Mobility  Outcome Measure Help needed turning from your back to your side while in a flat bed without using bedrails?: A Lot Help needed moving from lying on your back to sitting on the side of a flat bed without using bedrails?:  Total Help needed moving to and from a bed to a chair (including a wheelchair)?: Total Help needed standing up from a chair using your arms (e.g., wheelchair or bedside chair)?: Total Help needed to walk in hospital room?: Total Help needed climbing 3-5 steps with a railing? : Total 6 Click Score: 7    End of Session Equipment Utilized During Treatment: Gait belt Activity Tolerance: Patient tolerated treatment well Patient left: in chair;with call bell/phone within reach;with chair alarm set Nurse Communication: Mobility status;Need for lift equipment PT Visit Diagnosis: History of falling (Z91.81);Muscle weakness (generalized) (M62.81);Other symptoms and signs involving the nervous system (R29.898);Difficulty in walking, not elsewhere classified (R26.2);Pain Pain - Right/Left: Right Pain - part of body: Hip    Time: 1415-1500 PT Time Calculation (min) (ACUTE ONLY): 45 min   Charges:   PT Evaluation $PT Eval High Complexity: 1 High PT Treatments $Therapeutic Activity: 8-22 mins PT General Charges $$ ACUTE PT VISIT: 1 Visit         Micheline Portal, PT Acute Rehabilitation Services [phone] 07/06/2024  Montie Portal 07/06/2024, 3:47 PM

## 2024-07-06 NOTE — Plan of Care (Signed)
   Problem: Activity: Goal: Risk for activity intolerance will decrease Outcome: Progressing   Problem: Nutrition: Goal: Adequate nutrition will be maintained Outcome: Progressing   Problem: Pain Managment: Goal: General experience of comfort will improve and/or be controlled Outcome: Progressing   Problem: Safety: Goal: Ability to remain free from injury will improve Outcome: Progressing

## 2024-07-06 NOTE — Progress Notes (Signed)
   Subjective:  83 y.o. female now POD 1 from right femur cephalomedullary nail insertion. Patient has mild dementia at baseline and the nurses reports that she has been sun downing and more confused since surgery.  She is verbal and alert today, but still confused. Otherwise, no acute events overnight. No numbness or tingling to the extremity. Pain has been controlled  Objective:   VITALS:   Vitals:   07/05/24 2336 07/06/24 0500 07/06/24 0551 07/06/24 0930  BP: (!) 160/87  139/60 (!) 137/51  Pulse: 72  77 74  Resp: 14  16 18   Temp: 99.5 F (37.5 C)  98.7 F (37.1 C) 98.5 F (36.9 C)  TempSrc: Oral  Oral Oral  SpO2: 100%  97% 92%  Weight:  90.4 kg    Height:        Physical Exam: General: Alert, no acute distress Cardiovascular: No pedal edema Respiratory: No cyanosis, no use of accessory musculature GI: No organomegaly, abdomen is soft and non-tender Skin: No lesions in the area of chief complaint Neurologic: Sensation intact distally Psychiatric:  not oriented to self, place or time Lymphatic: No axillary or cervical lymphadenopathy  MUSCULOSKELETAL: Right lower extremity - Dressings clean and dry - TTP peri incisionally - Able to actively dorsiflex/plantarflex the ankle - Sensation intact to light touch sural, saphenous, tibial, deep and superficial peroneal nerve distributions - leg lengths equal - 2+ DP pulse  Lab Results  Component Value Date   WBC 12.2 (H) 07/06/2024   HGB 11.3 (L) 07/06/2024   HCT 34.2 (L) 07/06/2024   MCV 93.4 07/06/2024   PLT 146 (L) 07/06/2024   BMET    Component Value Date/Time   NA 142 07/06/2024 0030   NA 143 03/12/2023 0845   K 4.1 07/06/2024 0030   CL 106 07/06/2024 0030   CO2 27 07/06/2024 0030   GLUCOSE 161 (H) 07/06/2024 0030   BUN 22 07/06/2024 0030   BUN 19 03/12/2023 0845   CREATININE 1.06 (H) 07/06/2024 0030   CALCIUM  8.7 (L) 07/06/2024 0030   EGFR 59 (L) 03/12/2023 0845   GFRNONAA 52 (L) 07/06/2024 0030      Assessment/Plan: 83 y.o. female  now 1 Day Post-Op from Principal Problem:   S/P right hip fracture .  Patient is doing well from an orthopedic perspective, but does have some confusion. Recommend keeping the shades open during the day and turn off the TV at night to help with diurnal rhythm and placement of familiar objects to the patient to help with orientation.  She will work with PT today for ambulation  Weight bearing: as tolerated right lower extremity Pain control: oxy and tylenol  PT/OT DVT ppx: lovenox  inpatient, ASA 81 mg BID  x 6 weeks outpatient Bowel regimen: senna and docusate Completed ancef  x 2 doses post op Dispo: pending PT   Rankin LELON Pizza 07/06/2024, 11:33 AM

## 2024-07-06 NOTE — Progress Notes (Signed)
 PROGRESS NOTE    Terri Ortega  FMW:[id number] DOB: 20-Sep-1940 DOA: 07/04/2024 PCP: Okey Carlin Redbird, MD    Brief Narrative:   83 y.o. female with medical history significant for chronic HFpEF, hypertension, hypothyroidism, who presents to the ER after a mechanical fall.  In the ER, hypertensive with SBP in the 200s.  R knee x-ray without evidence of hardware loosening or acute fracture.  Right hip x-ray shows an acute right femoral intratrochanteric fracture.  Underwent IM implant by orthopedic on 12/3.  Assessment & Plan:    Right hip fracture secondary to mechanical fall, POA Status post ORIF/IM implant 12/3 Postop management including DVT prophylaxis, pain management wound care, weightbearing precautions and follow-up per orthopedic   Leukocytosis, reactive in the setting of hip fracture Reactive.   Hypertension On losartan.  IV as needed   CKD 3B Creatinine around baseline 1.12   Chronic HFpEF, EF 65% Euvolemic on exam Last 2D echo done on 03/27/2024 revealed LVEF 60 to 65% Monitor strict I's and O's and daily weight   Hypothyroidism Continue Synthroid   DVT prophylaxis: SCDs Start: 07/04/24 2123    Code Status: Full Code Family Communication:   Status is: Inpatient Remains inpatient appropriate because: On going surgical management.    PT Follow up Recs:   Subjective: IntraOp had an episode of SVT/atrial fibrillation, now when normal sinus rhythm. This morning eating breakfast doing okay no complaints Examination:  General exam: Appears calm and comfortable  Respiratory system: Clear to auscultation. Respiratory effort normal. Cardiovascular system: S1 & S2 heard, RRR. No JVD, murmurs, rubs, gallops or clicks. No pedal edema. Gastrointestinal system: Abdomen is nondistended, soft and nontender. No organomegaly or masses felt. Normal bowel sounds heard. Central nervous system: Alert and oriented. No focal neurological deficits. Extremities:  Symmetric 5 x 5 power.  Limited range of motion of her right lower extremity Skin: No rashes, lesions or ulcers Psychiatry: Judgement and insight appear normal. Mood & affect appropriate.                Diet Orders (From admission, onward)     Start     Ordered   07/05/24 2137  Diet regular Room service appropriate? Yes; Fluid consistency: Thin  Diet effective now       Question Answer Comment  Room service appropriate? Yes   Fluid consistency: Thin      07/05/24 2136            Objective: Vitals:   07/05/24 2336 07/06/24 0500 07/06/24 0551 07/06/24 0930  BP: (!) 160/87  139/60 (!) 137/51  Pulse: 72  77 74  Resp: 14  16 18   Temp: 99.5 F (37.5 C)  98.7 F (37.1 C) 98.5 F (36.9 C)  TempSrc: Oral  Oral Oral  SpO2: 100%  97% 92%  Weight:  90.4 kg    Height:        Intake/Output Summary (Last 24 hours) at 07/06/2024 1052 Last data filed at 07/06/2024 0932 Gross per 24 hour  Intake 1610 ml  Output 100 ml  Net 1510 ml   Filed Weights   07/04/24 2318 07/05/24 0500 07/06/24 0500  Weight: 85.7 kg 85.7 kg 90.4 kg    Scheduled Meds:  acetaminophen   975 mg Oral Q8H   Chlorhexidine  Gluconate Cloth  6 each Topical Daily   docusate sodium   100 mg Oral BID   enoxaparin  (LOVENOX ) injection  40 mg Subcutaneous Q24H   levothyroxine   100 mcg Oral Q0600   losartan  25 mg Oral Daily   Continuous Infusions:  Nutritional status     Body mass index is 38.28 kg/m.  Data Reviewed:   CBC: Recent Labs  Lab 07/04/24 2004 07/05/24 0335 07/06/24 0030  WBC 13.9* 12.5* 12.2*  NEUTROABS 12.1* 10.1*  --   HGB 13.5 11.6* 11.3*  HCT 41.2 35.6* 34.2*  MCV 92.4 92.0 93.4  PLT 170 167 146*   Basic Metabolic Panel: Recent Labs  Lab 07/04/24 2004 07/05/24 0335 07/06/24 0030  NA 143 143 142  K 3.6 3.7 4.1  CL 104 105 106  CO2 26 28 27   GLUCOSE 129* 137* 161*  BUN 26* 26* 22  CREATININE 1.14* 1.12* 1.06*  CALCIUM  9.9 9.3 8.7*  MG  --  2.1 1.9  PHOS  --  4.2  2.8   GFR: Estimated Creatinine Clearance: 40.8 mL/min (A) (by C-G formula based on SCr of 1.06 mg/dL (H)). Liver Function Tests: No results for input(s): AST, ALT, ALKPHOS, BILITOT, PROT, ALBUMIN in the last 168 hours. No results for input(s): LIPASE, AMYLASE in the last 168 hours. No results for input(s): AMMONIA in the last 168 hours. Coagulation Profile: Recent Labs  Lab 07/04/24 2004  INR 1.0   Cardiac Enzymes: No results for input(s): CKTOTAL, CKMB, CKMBINDEX, TROPONINI in the last 168 hours. BNP (last 3 results) Recent Labs    01/13/24 1020  PROBNP 628   HbA1C: No results for input(s): HGBA1C in the last 72 hours. CBG: Recent Labs  Lab 07/05/24 1144 07/05/24 2337 07/06/24 0554  GLUCAP 98 170* 149*   Lipid Profile: No results for input(s): CHOL, HDL, LDLCALC, TRIG, CHOLHDL, LDLDIRECT in the last 72 hours. Thyroid  Function Tests: No results for input(s): TSH, T4TOTAL, FREET4, T3FREE, THYROIDAB in the last 72 hours. Anemia Panel: No results for input(s): VITAMINB12, FOLATE, FERRITIN, TIBC, IRON, RETICCTPCT in the last 72 hours. Sepsis Labs: No results for input(s): PROCALCITON, LATICACIDVEN in the last 168 hours.  Recent Results (from the past 240 hours)  Surgical PCR screen     Status: None   Collection Time: 07/04/24 11:52 PM   Specimen: Nasal Mucosa; Nasal Swab  Result Value Ref Range Status   MRSA, PCR NEGATIVE NEGATIVE Final   Staphylococcus aureus NEGATIVE NEGATIVE Final    Comment: (NOTE) The Xpert SA Assay (FDA approved for NASAL specimens in patients 52 years of age and older), is one component of a comprehensive surveillance program. It is not intended to diagnose infection nor to guide or monitor treatment. Performed at [facility], [address], [place], [place] [address]          Radiology Studies: DG HIP UNILAT WITH PELVIS 2-3 VIEWS RIGHT Result  Date: 07/05/2024 CLINICAL DATA:  Elective surgery EXAM: DG HIP (WITH OR WITHOUT PELVIS) 2-3V*R* COMPARISON:  Right hip x-ray 07/04/2024 FINDINGS: Ten intraoperative fluoroscopic views of the right hip were submitted. Right hip screw and intramedullary nail were placed fixating in intratrochanteric fracture. Alignment is anatomic. Fluoroscopy time 1 minutes 14 seconds. Fluoroscopy dose 16.34 micro gray. IMPRESSION: Intraoperative fluoroscopic views of the right hip. Electronically Signed   By: Greig Pique M.D.   On: 07/05/2024 23:47   DG C-Arm 1-60 Min-No Report Result Date: 07/05/2024 Fluoroscopy was utilized by the requesting physician.  No radiographic interpretation.   DG C-Arm 1-60 Min-No Report Result Date: 07/05/2024 Fluoroscopy was utilized by the requesting physician.  No radiographic interpretation.   DG Knee 2 Views Right Result Date: 07/04/2024 CLINICAL DATA:  Fall, knee  pain EXAM: RIGHT KNEE - 1-2 VIEW COMPARISON:  None Available. FINDINGS: Right knee total arthroplasty present. No evidence for hardware loosening or acute fracture. Joint spaces are maintained and alignment is anatomic. Soft tissues are within normal limits. IMPRESSION: Right knee total arthroplasty without evidence for hardware loosening or acute fracture. Electronically Signed   By: Greig Pique M.D.   On: 07/04/2024 19:50   DG Hip Unilat  With Pelvis 2-3 Views Right Result Date: 07/04/2024 CLINICAL DATA:  Pain after fall EXAM: DG HIP (WITH OR WITHOUT PELVIS) 2-3V RIGHT COMPARISON:  None Available. FINDINGS: There is an acute right femoral intratrochanteric fracture which is nondisplaced. The lesser trochanter is a free fracture fragment displaced medially 1 cm. There is no dislocation. Left hip arthroplasty appears in anatomic alignment. There are degenerative changes of the lumbar spine. IMPRESSION: Acute right femoral intratrochanteric fracture. Electronically Signed   By: Greig Pique M.D.   On: 07/04/2024 19:41            LOS: 2 days   Time spent= 35 mins    Burgess JAYSON Dare, MD [facility]  If 7PM-7AM, please contact night-coverage  07/06/2024, 10:52 AM

## 2024-07-06 NOTE — Plan of Care (Signed)
  Problem: Education: Goal: Knowledge of General Education information will improve Description: Including pain rating scale, medication(s)/side effects and non-pharmacologic comfort measures Outcome: Progressing   Problem: Health Behavior/Discharge Planning: Goal: Ability to manage health-related needs will improve Outcome: Progressing   Problem: Clinical Measurements: Goal: Ability to maintain clinical measurements within normal limits will improve Outcome: Progressing Goal: Will remain free from infection Outcome: Progressing Goal: Diagnostic test results will improve Outcome: Progressing Goal: Respiratory complications will improve Outcome: Progressing Goal: Cardiovascular complication will be avoided Outcome: Progressing   Problem: Activity: Goal: Risk for activity intolerance will decrease Outcome: Progressing   Problem: Nutrition: Goal: Adequate nutrition will be maintained Outcome: Adequate for Discharge   Problem: Coping: Goal: Level of anxiety will decrease Outcome: Progressing   Problem: Elimination: Goal: Will not experience complications related to bowel motility Outcome: Progressing Goal: Will not experience complications related to urinary retention Outcome: Adequate for Discharge   Problem: Pain Managment: Goal: General experience of comfort will improve and/or be controlled Outcome: Progressing   Problem: Safety: Goal: Ability to remain free from injury will improve Outcome: Progressing   Problem: Skin Integrity: Goal: Risk for impaired skin integrity will decrease Outcome: Progressing

## 2024-07-06 NOTE — Progress Notes (Signed)
 OT Cancellation Note  Patient Details Name: Terri Ortega MRN: [id number] DOB: [birth year]   Cancelled Treatment:    Reason Eval/Treat Not Completed: (P) Fatigue/lethargy limiting ability to participate. Pt unable to stay awake or fully open eyes, lethargic, replied to name a few times but now able to answer questions or participate. Will return tomorrow.  [name] 07/06/2024, 4:01 PM

## 2024-07-06 NOTE — Progress Notes (Signed)
 Daughter has noted some change in global mental status throughout the day She has stable vitals. No focal neuro deficits. Suspect this is delirium induced by multiple factors including age, medications, surgery, anesthesia, environment etc.  For now will order CT head, and may consider MRI brain tomorrow.  Check TSH and Ammonia.   Discussed in details with her daugther. She is very thankful.   Burgess Dare MD

## 2024-07-07 ENCOUNTER — Encounter (HOSPITAL_COMMUNITY): Payer: Self-pay

## 2024-07-07 DIAGNOSIS — Z8781 Personal history of (healed) traumatic fracture: Secondary | ICD-10-CM | POA: Diagnosis not present

## 2024-07-07 LAB — CBC
HCT: 27.6 % — ABNORMAL LOW (ref 36.0–46.0)
Hemoglobin: 8.9 g/dL — ABNORMAL LOW (ref 12.0–15.0)
MCH: 30 pg (ref 26.0–34.0)
MCHC: 32.2 g/dL (ref 30.0–36.0)
MCV: 92.9 fL (ref 80.0–100.0)
Platelets: 136 K/uL — ABNORMAL LOW (ref 150–400)
RBC: 2.97 MIL/uL — ABNORMAL LOW (ref 3.87–5.11)
RDW: 13 % (ref 11.5–15.5)
WBC: 13.1 K/uL — ABNORMAL HIGH (ref 4.0–10.5)
nRBC: 0 % (ref 0.0–0.2)

## 2024-07-07 LAB — URINALYSIS, ROUTINE W REFLEX MICROSCOPIC
Bilirubin Urine: NEGATIVE
Glucose, UA: NEGATIVE mg/dL
Hgb urine dipstick: NEGATIVE
Ketones, ur: NEGATIVE mg/dL
Leukocytes,Ua: NEGATIVE
Nitrite: NEGATIVE
Protein, ur: NEGATIVE mg/dL
Specific Gravity, Urine: 1.025 (ref 1.005–1.030)
pH: 5 (ref 5.0–8.0)

## 2024-07-07 LAB — GLUCOSE, CAPILLARY
Glucose-Capillary: 108 mg/dL — ABNORMAL HIGH (ref 70–99)
Glucose-Capillary: 108 mg/dL — ABNORMAL HIGH (ref 70–99)
Glucose-Capillary: 133 mg/dL — ABNORMAL HIGH (ref 70–99)
Glucose-Capillary: 138 mg/dL — ABNORMAL HIGH (ref 70–99)

## 2024-07-07 NOTE — Evaluation (Signed)
 Occupational Therapy Evaluation Patient Details Name: Terri Ortega MRN: [id number] DOB: September 26, 1940 Today's Date: 07/07/2024   History of Present Illness   Patient is an 83 y/o female admitted 07/04/24 after fall with R hip IT fx.  Underwent IM nail on 07/05/24.  PMH positive for CHF, HTN, uterine cancer s/p surgery, XRT, hypothyroidism, L THA, R TKA x 2 and mild dementia.     Clinical Impressions Pt c/o RLE pain, sitting in recliner, asking to return to bed. Pt poor historian, history of dementia, reports lives with husband and PLOF mod I without AD. Pt inconsistent with time frames for recent falls, difficulty with DOB or todays date at times, other times is alert/oriented. Pt max/total A for dressing/bathing. Pt with poor sitting balance, posterior lean, requires one hand on bed rail or physical assist to maintain sitting. Pt stands with max A x2 using Stedy. Pt would benefit from postacute rehab <3hrs/day to maximize functional strength and participation in ADLs, will continue to see acutely to progress as able.      If plan is discharge home, recommend the following:   Two people to help with walking and/or transfers;Two people to help with bathing/dressing/bathroom;Assistance with cooking/housework;Assist for transportation;Help with stairs or ramp for entrance;Direct supervision/assist for medications management     Functional Status Assessment   Patient has had a recent decline in their functional status and demonstrates the ability to make significant improvements in function in a reasonable and predictable amount of time.     Equipment Recommendations   Other (comment) (defer)     Recommendations for Other Services         Precautions/Restrictions   Precautions Precautions: Fall Recall of Precautions/Restrictions: Impaired Restrictions Weight Bearing Restrictions Per Provider Order: Yes RLE Weight Bearing Per Provider Order: Weight bearing as  tolerated     Mobility Bed Mobility Overal bed mobility: Needs Assistance Bed Mobility: Sit to Supine       Sit to supine: Total assist   General bed mobility comments: total A to assist back to bed for helicopter transfer    Transfers Overall transfer level: Needs assistance Equipment used: Ambulation equipment used Transfers: Sit to/from Stand Sit to Stand: Max assist, +2 physical assistance, +2 safety/equipment           General transfer comment: max A x2 for STS with Stedy. Not able to stand from recliner with hands on Stedy bar, had to push up from chair arm rests to stand. Transfer via Lift Equipment: Stedy    Balance Overall balance assessment: Needs assistance Sitting-balance support: Bilateral upper extremity supported, Feet supported Sitting balance-Leahy Scale: Poor Sitting balance - Comments: significant posterior lean, not able to sit EOB with at least one hand supported Postural control: Posterior lean Standing balance support: Bilateral upper extremity supported, During functional activity, Reliant on assistive device for balance Standing balance-Leahy Scale: Poor Standing balance comment: reliant on Stedy for support                           ADL either performed or assessed with clinical judgement   ADL Overall ADL's : Needs assistance/impaired Eating/Feeding: Independent   Grooming: Set up;Supervision/safety;Sitting   Upper Body Bathing: Maximal assistance;Sitting   Lower Body Bathing: Total assistance;Sitting/lateral leans;Bed level   Upper Body Dressing : Maximal assistance;Sitting   Lower Body Dressing: Total assistance;Sitting/lateral leans;Bed level   Toilet Transfer: Maximal assistance;+2 for physical assistance;+2 for safety/equipment Laurent) Toilet Transfer Details (indicate cue type  and reason): stedy Toileting- Clothing Manipulation and Hygiene: Maximal assistance;Total assistance;Sitting/lateral lean;Bed level          General ADL Comments: Pt with poor sitting balance, posterior lean when sitting, overall max/total A for ADLs. Pt with R hip pain, limited standing, max A x2 using Stedy for transfers. Pt overall good BUE strength/ROM, limited due to poor sitting/standing balance. Not able to lean forward in chair without support.     Vision Baseline Vision/History: 1 Wears glasses Ability to See in Adequate Light: 0 Adequate Patient Visual Report: No change from baseline       Perception         Praxis         Pertinent Vitals/Pain Pain Assessment Pain Assessment: Faces Faces Pain Scale: Hurts even more Pain Location: R hip with movement to EOB and sit to stand Pain Descriptors / Indicators: Grimacing, Guarding, Moaning Pain Intervention(s): Monitored during session, Limited activity within patient's tolerance     Extremity/Trunk Assessment Upper Extremity Assessment Upper Extremity Assessment: Overall WFL for tasks assessed   Lower Extremity Assessment Lower Extremity Assessment: Defer to PT evaluation       Communication Communication Communication: Impaired Factors Affecting Communication: Difficulty expressing self   Cognition Arousal: Alert Behavior During Therapy: Flat affect Cognition: History of cognitive impairments             OT - Cognition Comments: history of mild dementia, noticable during session. Inconsistent PLOF and time frames, orientation to self/date inconsistent throughout session.                 Following commands: Impaired Following commands impaired: Follows one step commands inconsistently, Follows one step commands with increased time     Cueing  General Comments   Cueing Techniques: Verbal cues;Tactile cues      Exercises     Shoulder Instructions      Home Living Family/patient expects to be discharged to:: Skilled nursing facility Living Arrangements: Spouse/significant other                                Additional Comments: Pt lives with husband, plans to go to [facility] for a bit before going home.      Prior Functioning/Environment Prior Level of Function : Patient poor historian/Family not available             Mobility Comments: reports ind, sometimes uses RW/cane ADLs Comments: husband helps with showers    OT Problem List: Decreased strength;Decreased range of motion;Decreased activity tolerance;Impaired balance (sitting and/or standing);Decreased cognition;Decreased safety awareness;Pain;Obesity   OT Treatment/Interventions: Self-care/ADL training;Therapeutic exercise;Energy conservation;DME and/or AE instruction;Therapeutic activities;Patient/family education;Balance training      OT Goals(Current goals can be found in the care plan section)   Acute Rehab OT Goals Patient Stated Goal: to get therapy prior to going home OT Goal Formulation: With patient Time For Goal Achievement: 07/21/24 Potential to Achieve Goals: Good   OT Frequency:  Min 2X/week    Co-evaluation              AM-PAC OT 6 Clicks Daily Activity     Outcome Measure Help from another person eating meals?: None Help from another person taking care of personal grooming?: A Little Help from another person toileting, which includes using toliet, bedpan, or urinal?: Total Help from another person bathing (including washing, rinsing, drying)?: A Lot Help from another person to put on and taking off regular  upper body clothing?: A Lot Help from another person to put on and taking off regular lower body clothing?: Total 6 Click Score: 13   End of Session Equipment Utilized During Treatment: Gait belt;Other (comment) (stedy) Nurse Communication: Mobility status  Activity Tolerance: Patient tolerated treatment well Patient left: in bed;with call bell/phone within reach;with bed alarm set  OT Visit Diagnosis: Unsteadiness on feet (R26.81);Other abnormalities of gait and mobility  (R26.89);Repeated falls (R29.6);Muscle weakness (generalized) (M62.81);Pain;Other symptoms and signs involving cognitive function Pain - Right/Left: Right Pain - part of body: Hip                Time: 8551-8485 OT Time Calculation (min): 26 min Charges:  OT General Charges $OT Visit: 1 Visit OT Evaluation $OT Eval Moderate Complexity: 1 Mod OT Treatments $Self Care/Home Management : 8-22 mins  [place], OTR/L   [name] 07/07/2024, 3:36 PM

## 2024-07-07 NOTE — Progress Notes (Signed)
 Physical Therapy Treatment Patient Details Name: Terri Ortega MRN: [id number] DOB: 06-08-1941 Today's Date: 07/07/2024   History of Present Illness Patient is an 83 y/o female admitted 07/04/24 after fall with R hip IT fx.  Underwent IM nail on 07/05/24.  PMH positive for CHF, HTN, uterine cancer s/p surgery, XRT, hypothyroidism, L THA, R TKA x 2 and mild dementia.    PT Comments  Pt is oriented to self and location but not to month/year, she requires verba/tactile cues to follow commands. +2 max assist for supine to sit, +2 mod/max for sit to stand from elevated bed, Stedy used to transfer pt bed to recliner. She was able to maintain standing in the [facility] for ~2 seconds. Assisted pt with RLE strengthening exercises.    If plan is discharge home, recommend the following: Two people to help with bathing/dressing/bathroom;A little help with bathing/dressing/bathroom;Assist for transportation;Direct supervision/assist for medications management;Supervision due to cognitive status   Can travel by private vehicle     No  Equipment Recommendations  None recommended by PT    Recommendations for Other Services       Precautions / Restrictions Precautions Precautions: Fall Recall of Precautions/Restrictions: Impaired Restrictions Weight Bearing Restrictions Per Provider Order: No RLE Weight Bearing Per Provider Order: Weight bearing as tolerated     Mobility  Bed Mobility Overal bed mobility: Needs Assistance Bed Mobility: Supine to Sit     Supine to sit: +2 for physical assistance, Max assist     General bed mobility comments: assist to scoot hips with bed pad, cues for using rail and A for R LE off EOB and to lift trunk    Transfers Overall transfer level: Needs assistance   Transfers: Bed to chair/wheelchair/BSC, Sit to/from Stand Sit to Stand: Mod assist, +2 physical assistance, Via lift equipment, Max assist           General transfer comment: Stedy for bed to  recliner transfer, +2 mod/max assist for sit to stand, able to stand in stedy just for a few seconds then sat on flaps Transfer via Lift Equipment: Stedy  Ambulation/Gait                   Stairs             Wheelchair Mobility     Tilt Bed    Modified Rankin (Stroke Patients Only)       Balance Overall balance assessment: Needs assistance Sitting-balance support: Feet supported, Single extremity supported Sitting balance-Leahy Scale: Fair     Standing balance support: Bilateral upper extremity supported Standing balance-Leahy Scale: Poor                              Communication Communication Communication: No apparent difficulties Factors Affecting Communication:  (limited verbal responses)  Cognition Arousal: Alert Behavior During Therapy: Flat affect   PT - Cognitive impairments: History of cognitive impairments, No family/caregiver present to determine baseline, Problem solving, Attention, Sequencing, Memory                       PT - Cognition Comments: flat affect, increased time to follow commands, required verbal/tactile cues; oriented to self and location, not to month/year Following commands: Impaired Following commands impaired: Follows one step commands inconsistently, Follows one step commands with increased time    Cueing Cueing Techniques: Verbal cues, Tactile cues  Exercises Total Joint Exercises Ankle Circles/Pumps: AROM, AAROM,  10 reps, Supine, Both Heel Slides: AAROM, Supine, Right, 10 reps Hip ABduction/ADduction: AAROM, Right, 10 reps, Supine Long Arc Quad: AROM, Right, 10 reps, Seated    General Comments        Pertinent Vitals/Pain Pain Assessment Faces Pain Scale: Hurts even more Pain Location: R hip with movement to EOB and sit to stand Pain Descriptors / Indicators: Grimacing, Guarding, Moaning Pain Intervention(s): Limited activity within patient's tolerance, Monitored during session,  Repositioned, Premedicated before session (pt refused ice)    Home Living                          Prior Function            PT Goals (current goals can now be found in the care plan section) Acute Rehab PT Goals Patient Stated Goal: unable to state PT Goal Formulation: Patient unable to participate in goal setting Time For Goal Achievement: 07/20/24 Potential to Achieve Goals: Fair Progress towards PT goals: Progressing toward goals    Frequency    Min 3X/week      PT Plan      Co-evaluation              AM-PAC PT 6 Clicks Mobility   Outcome Measure  Help needed turning from your back to your side while in a flat bed without using bedrails?: A Lot Help needed moving from lying on your back to sitting on the side of a flat bed without using bedrails?: Total Help needed moving to and from a bed to a chair (including a wheelchair)?: Total Help needed standing up from a chair using your arms (e.g., wheelchair or bedside chair)?: Total Help needed to walk in hospital room?: Total Help needed climbing 3-5 steps with a railing? : Total 6 Click Score: 7    End of Session Equipment Utilized During Treatment: Gait belt Activity Tolerance: Patient tolerated treatment well Patient left: in chair;with call bell/phone within reach;with chair alarm set Nurse Communication: Mobility status;Need for lift equipment PT Visit Diagnosis: History of falling (Z91.81);Muscle weakness (generalized) (M62.81);Other symptoms and signs involving the nervous system (R29.898);Difficulty in walking, not elsewhere classified (R26.2);Pain Pain - Right/Left: Right Pain - part of body: Hip     Time: 1012-1023 PT Time Calculation (min) (ACUTE ONLY): 11 min  Charges:    $Therapeutic Activity: 8-22 mins PT General Charges $$ ACUTE PT VISIT: 1 Visit                    Sylvan Delon Copp PT 07/07/2024  Acute Rehabilitation Services  Office [phone]

## 2024-07-07 NOTE — Plan of Care (Signed)
  Problem: Education: Goal: Knowledge of General Education information will improve Description: Including pain rating scale, medication(s)/side effects and non-pharmacologic comfort measures Outcome: Progressing   Problem: Health Behavior/Discharge Planning: Goal: Ability to manage health-related needs will improve Outcome: Progressing   Problem: Clinical Measurements: Goal: Ability to maintain clinical measurements within normal limits will improve Outcome: Progressing   Problem: Activity: Goal: Risk for activity intolerance will decrease Outcome: Progressing   Problem: Coping: Goal: Level of anxiety will decrease Outcome: Progressing   Problem: Elimination: Goal: Will not experience complications related to bowel motility Outcome: Progressing   Problem: Pain Managment: Goal: General experience of comfort will improve and/or be controlled Outcome: Progressing   Problem: Safety: Goal: Ability to remain free from injury will improve Outcome: Progressing   Problem: Skin Integrity: Goal: Risk for impaired skin integrity will decrease Outcome: Progressing

## 2024-07-07 NOTE — Progress Notes (Signed)
 PROGRESS NOTE    Terri Ortega  FMW:[id number] DOB: 05/05/1941 DOA: 07/04/2024 PCP: Okey Carlin Redbird, MD    Brief Narrative:    83 y.o. female with medical history significant for chronic HFpEF, hypertension, hypothyroidism, who presents to the ER after a mechanical fall.  In the ER, hypertensive with SBP in the 200s.  R knee x-ray without evidence of hardware loosening or acute fracture.  Right hip x-ray shows an acute right femoral intratrochanteric fracture.  Underwent IM implant by orthopedic on 12/3.   Assessment & Plan:   Right hip fracture secondary to mechanical fall, POA Status post ORIF/IM implant 12/3 Postop management including DVT prophylaxis, pain management wound care, weightbearing precautions and follow-up per orthopedic   Leukocytosis, reactive in the setting of hip fracture Reactive.   Hypertension On losartan .  IV as needed   CKD 3B Creatinine around baseline 1.12   Chronic HFpEF, EF 65% Euvolemic on exam Last 2D echo done on 03/27/2024 revealed LVEF 60 to 65% Monitor strict I's and O's and daily weight   Hypothyroidism Continue Synthroid    DVT prophylaxis: SCDs Start: 07/04/24 2123    Code Status: Full Code Family Communication:  Family was at bedside. Status is: Inpatient Remains inpatient appropriate because: On going surgical management.   Diet Orders (From admission, onward)     Start     Ordered   07/05/24 2137  Diet regular Room service appropriate? Yes; Fluid consistency: Thin  Diet effective now       Question Answer Comment  Room service appropriate? Yes   Fluid consistency: Thin      07/05/24 2136            Objective: Vitals:   07/07/24 0500 07/07/24 0514 07/07/24 1035 07/07/24 1340  BP:  118/66 (!) 126/45 (!) 125/43  Pulse:  66 64 70  Resp:  18 17 18   Temp:  98.6 F (37 C) 97.8 F (36.6 C) 98 F (36.7 C)  TempSrc:   Oral   SpO2:  95% 94% 96%  Weight: 92.9 kg     Height:        Intake/Output Summary (Last  24 hours) at 07/07/2024 1648 Last data filed at 07/07/2024 1614 Gross per 24 hour  Intake --  Output 300 ml  Net -300 ml   Filed Weights   07/05/24 0500 07/06/24 0500 07/07/24 0500  Weight: 85.7 kg 90.4 kg 92.9 kg    Scheduled Meds:  acetaminophen   975 mg Oral Q8H   docusate sodium   100 mg Oral BID   enoxaparin  (LOVENOX ) injection  40 mg Subcutaneous Q24H   levothyroxine   100 mcg Oral Q0600   losartan   25 mg Oral Daily     Body mass index is 39.34 kg/m.  Data Reviewed:   CBC: Recent Labs  Lab 07/04/24 2004 07/05/24 0335 07/06/24 0030 07/07/24 0343  WBC 13.9* 12.5* 12.2* 13.1*  NEUTROABS 12.1* 10.1*  --   --   HGB 13.5 11.6* 11.3* 8.9*  HCT 41.2 35.6* 34.2* 27.6*  MCV 92.4 92.0 93.4 92.9  PLT 170 167 146* 136*   Basic Metabolic Panel: Recent Labs  Lab 07/04/24 2004 07/05/24 0335 07/06/24 0030  NA 143 143 142  K 3.6 3.7 4.1  CL 104 105 106  CO2 26 28 27   GLUCOSE 129* 137* 161*  BUN 26* 26* 22  CREATININE 1.14* 1.12* 1.06*  CALCIUM  9.9 9.3 8.7*  MG  --  2.1 1.9  PHOS  --  4.2 2.8  GFR: Estimated Creatinine Clearance: 41.4 mL/min (A) (by C-G formula based on SCr of 1.06 mg/dL (H)). Liver Function Tests: No results for input(s): AST, ALT, ALKPHOS, BILITOT, PROT, ALBUMIN in the last 168 hours. No results for input(s): LIPASE, AMYLASE in the last 168 hours. Recent Labs  Lab 07/06/24 1906  AMMONIA 17   Coagulation Profile: Recent Labs  Lab 07/04/24 2004  INR 1.0   Cardiac Enzymes: No results for input(s): CKTOTAL, CKMB, CKMBINDEX, TROPONINI in the last 168 hours. BNP (last 3 results) Recent Labs    01/13/24 1020  PROBNP 628   HbA1C: No results for input(s): HGBA1C in the last 72 hours. CBG: Recent Labs  Lab 07/06/24 1202 07/06/24 1838 07/07/24 0001 07/07/24 0516 07/07/24 1202  GLUCAP 184* 157* 133* 108* 138*   Lipid Profile: No results for input(s): CHOL, HDL, LDLCALC, TRIG, CHOLHDL, LDLDIRECT in  the last 72 hours. Thyroid  Function Tests: Recent Labs    07/06/24 1906  TSH 0.477   Anemia Panel: No results for input(s): VITAMINB12, FOLATE, FERRITIN, TIBC, IRON, RETICCTPCT in the last 72 hours. Sepsis Labs: No results for input(s): PROCALCITON, LATICACIDVEN in the last 168 hours.  Recent Results (from the past 240 hours)  Surgical PCR screen     Status: None   Collection Time: 07/04/24 11:52 PM   Specimen: Nasal Mucosa; Nasal Swab  Result Value Ref Range Status   MRSA, PCR NEGATIVE NEGATIVE Final   Staphylococcus aureus NEGATIVE NEGATIVE Final    Comment: (NOTE) The Xpert SA Assay (FDA approved for NASAL specimens in patients 67 years of age and older), is one component of a comprehensive surveillance program. It is not intended to diagnose infection nor to guide or monitor treatment. Performed at [facility], [address], [place], [place] [address]      Radiology Studies: CT HEAD WO CONTRAST ( ) Result Date: 07/07/2024 EXAM: CT HEAD WITHOUT CONTRAST 07/06/2024 11:58:00 PM TECHNIQUE: CT of the head was performed without the administration of intravenous contrast. Automated exposure control, iterative reconstruction, and/or weight based adjustment of the mA/kV was utilized to reduce the radiation dose to as low as reasonably achievable. COMPARISON: 03/11/2024 CLINICAL HISTORY: Memory loss; Mental status change, unknown cause FINDINGS: BRAIN AND VENTRICLES: No acute hemorrhage. No evidence of acute infarct. No hydrocephalus. No extra-axial collection. No mass effect or midline shift. Patchy and confluent decreased attenuation throughout deep and periventricular white matter of cerebral hemispheres bilaterally, compatible with chronic microvascular ischemic disease. Cerebral ventricle sizes concordant with cerebral volume loss. Atherosclerotic calcifications within cavernous internal carotid arteries. ORBITS: No acute abnormality. Bilateral  lens replacement. SINUSES: No acute abnormality. SOFT TISSUES AND SKULL: No acute soft tissue abnormality. No skull fracture. IMPRESSION: 1. No acute intracranial abnormality. 2. 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 3. Cerebral volume loss with concordant ventricle sizes. Electronically signed by: Franky Stanford MD 07/07/2024 12:04 AM EST RP Workstation: [id number]   DG HIP UNILAT WITH PELVIS 2-3 VIEWS RIGHT Result Date: 07/05/2024 CLINICAL DATA:  Elective surgery EXAM: DG HIP (WITH OR WITHOUT PELVIS) 2-3V*R* COMPARISON:  Right hip x-ray 07/04/2024 FINDINGS: Ten intraoperative fluoroscopic views of the right hip were submitted. Right hip screw and intramedullary nail were placed fixating in intratrochanteric fracture. Alignment is anatomic. Fluoroscopy time 1 minutes 14 seconds. Fluoroscopy dose 16.34 micro gray. IMPRESSION: Intraoperative fluoroscopic views of the right hip. Electronically Signed   By: Greig Pique M.D.   On: 07/05/2024 23:47   DG  C-Arm 1-60 Min-No Report Result Date: 07/05/2024 Fluoroscopy was utilized by the requesting physician.  No radiographic interpretation.   DG C-Arm 1-60 Min-No Report Result Date: 07/05/2024 Fluoroscopy was utilized by the requesting physician.  No radiographic interpretation.    LOS: 3 days   I have spent 42 minutes in the evaluation and treatment of this patient.  Brigida Bureau, MD [facility]  If 7PM-7AM, please contact night-coverage  07/07/2024, 4:48 PM

## 2024-07-07 NOTE — Progress Notes (Signed)
   Subjective:  83 y.o. female now POD 2 from right cephalomedullary nail insertion. Patient has improvements today in cognition. She reports that the pain has been well controlled.  Otherwise, no acute events overnight. No numbness or tingling to the extremity.  Objective:   VITALS:   Vitals:   07/06/24 2006 07/07/24 0500 07/07/24 0514 07/07/24 1035  BP: (!) 136/93  118/66 (!) 126/45  Pulse: 68  66 64  Resp: 16  18 17   Temp: 98.2 F (36.8 C)  98.6 F (37 C) 97.8 F (36.6 C)  TempSrc: Oral   Oral  SpO2: 96%  95% 94%  Weight:  92.9 kg    Height:        Physical Exam: General: Alert, no acute distress Cardiovascular: No pedal edema Respiratory: No cyanosis, no use of accessory musculature GI: No organomegaly, abdomen is soft and non-tender Skin: No lesions in the area of chief complaint Neurologic: Sensation intact distally Psychiatric: Patient is competent for consent with normal mood and affect Lymphatic: No axillary or cervical lymphadenopathy  MUSCULOSKELETAL: right lower extremity - Strike through over the bandage, otherwise bandage is intact - TTP peri incisionally - Motor intact to ankle dorsiflexion/plantarflexion - Sensation intact to light touch sural, saphenous, tibial, superficial and deep peroneal nerve distributions - 2+ DP pulse  Lab Results  Component Value Date   WBC 13.1 (H) 07/07/2024   HGB 8.9 (L) 07/07/2024   HCT 27.6 (L) 07/07/2024   MCV 92.9 07/07/2024   PLT 136 (L) 07/07/2024   BMET    Component Value Date/Time   NA 142 07/06/2024 0030   NA 143 03/12/2023 0845   K 4.1 07/06/2024 0030   CL 106 07/06/2024 0030   CO2 27 07/06/2024 0030   GLUCOSE 161 (H) 07/06/2024 0030   BUN 22 07/06/2024 0030   BUN 19 03/12/2023 0845   CREATININE 1.06 (H) 07/06/2024 0030   CALCIUM  8.7 (L) 07/06/2024 0030   EGFR 59 (L) 03/12/2023 0845   GFRNONAA 52 (L) 07/06/2024 0030     Assessment/Plan: 83 y.o. female  now 2 Days Post-Op from Principal Problem:    S/P right hip fracture .  Patient is doing well.  Her cognition has improved. She should continue to work with physical therapy for mobilization. She has acute blood loss anemia with greater than 2 g drop in hgb, will continue to monitor and transfuse as indicated to maintain hgb > 7  Weight bearing: as tolerated right lower extremity Pain control: oxycodone  and tylenol  PT/OT DVT ppx: lovenox  while inpatient Bowel regimen: senna and docusate Continue frequent reorientation and maintain normal day and night cycles to help with confusion Dispo: pending PT and placement   Rankin LELON Pizza 07/07/2024, 12:53 PM

## 2024-07-07 NOTE — Plan of Care (Signed)
  Problem: Education: Goal: Knowledge of General Education information will improve Description: Including pain rating scale, medication(s)/side effects and non-pharmacologic comfort measures Outcome: Progressing   Problem: Health Behavior/Discharge Planning: Goal: Ability to manage health-related needs will improve Outcome: Progressing   Problem: Clinical Measurements: Goal: Ability to maintain clinical measurements within normal limits will improve Outcome: Progressing Goal: Will remain free from infection Outcome: Progressing Goal: Diagnostic test results will improve Outcome: Progressing Goal: Respiratory complications will improve Outcome: Progressing Goal: Cardiovascular complication will be avoided Outcome: Progressing   Problem: Activity: Goal: Risk for activity intolerance will decrease Outcome: Progressing   Problem: Nutrition: Goal: Adequate nutrition will be maintained Outcome: Progressing   Problem: Coping: Goal: Level of anxiety will decrease Outcome: Progressing   Problem: Elimination: Goal: Will not experience complications related to bowel motility Outcome: Progressing Goal: Will not experience complications related to urinary retention Outcome: Adequate for Discharge   Problem: Pain Managment: Goal: General experience of comfort will improve and/or be controlled Outcome: Progressing   Problem: Safety: Goal: Ability to remain free from injury will improve Outcome: Progressing   Problem: Skin Integrity: Goal: Risk for impaired skin integrity will decrease Outcome: Progressing

## 2024-07-07 NOTE — NC FL2 (Signed)
 [facility]  MEDICAID FL2 LEVEL OF CARE FORM     IDENTIFICATION  Patient Name: Jaylie Neaves Elk Birthdate: Jul 06, 1941 Sex: female Admission Date (Current Location): 07/04/2024  [facility] and [place] Number:  Producer, Television/film/video and Address:  [facility],  [address], [place] [address]      Provider Number: [id number]  Attending Physician Name and Address:  Soledad Ligas, DO  Relative Name and Phone Number:  OKEY HEINRICH (Daughter)  [phone] (Home Phone)    Current Level of Care: Hospital Recommended Level of Care: Skilled Nursing Facility Prior Approval Number:    Date Approved/Denied:   PASRR Number: [id number]  Discharge Plan: SNF    Current Diagnoses: Patient Active Problem List   Diagnosis Date Noted   S/P right hip fracture 07/04/2024   Leg swelling 01/12/2024   Nausea 04/27/2022   Blood loss anemia 04/24/2022   Slow transit constipation 04/24/2022   Subcapital fracture of femur, left, closed, initial encounter (HCC) 04/15/2022   Fall at home, initial encounter 04/15/2022   CKD (chronic kidney disease) stage 3, GFR 30-59 ml/min (HCC) 04/15/2022   Hypertensive urgency 04/15/2022   Osteoporosis 04/15/2022   Aortic valve disease 07/10/2021   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   Orthostatic dizziness 11/14/2020   Educated about COVID-19 virus infection 12/21/2019   Mild cognitive impairment 09/06/2019   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   Preop cardiovascular exam 12/20/2018   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   Uterine cancer (HCC) 03/28/2018   Arthralgia of hip or thigh 03/28/2018   Benign essential HTN 03/28/2018   Overweight 03/28/2018   Heart disease 11/09/2014   Leukocytosis 10/31/2014   Heart failure with improved ejection fraction (HFimpEF) (HCC) 10/30/2014   Hypothyroidism 10/30/2014   Chest pain at rest  10/30/2014   Chest pain 10/30/2014   Endometrial cancer (HCC) 01/30/2014   Dyspnea 01/11/2014   Edema 01/11/2014   Low back pain with right-sided sciatica 12/13/2013   Degeneration of lumbar or lumbosacral intervertebral disc 12/13/2013   Sciatica 12/13/2013    Orientation RESPIRATION BLADDER Height & Weight     Self, Place  Normal Incontinent, Indwelling catheter Weight: 92.9 kg Height:  5' 0.5 (153.7 cm)  BEHAVIORAL SYMPTOMS/MOOD NEUROLOGICAL BOWEL NUTRITION STATUS      Incontinent Diet (regular)  AMBULATORY STATUS COMMUNICATION OF NEEDS Skin   Extensive Assist Verbally Surgical wounds (Right femur IM Nail)                       Personal Care Assistance Level of Assistance  Bathing, Feeding, Dressing Bathing Assistance: Limited assistance Feeding assistance: Limited assistance Dressing Assistance: Limited assistance     Functional Limitations Info  Sight, Hearing, Speech Sight Info: Impaired (eyeglasses) Hearing Info: Adequate Speech Info: Adequate    SPECIAL CARE FACTORS FREQUENCY  PT (By licensed PT), OT (By licensed OT)     PT Frequency: 5x/wk OT Frequency: 5x/wk            Contractures Contractures Info: Not present    Additional Factors Info  Code Status, Allergies, Psychotropic Code Status Info: Full Code Allergies Info: Percocet (Oxycodone -acetaminophen ) Psychotropic Info: N/A         Current Medications (07/07/2024):  This is the current hospital active medication list Current Facility-Administered Medications  Medication Dose Route Frequency Provider Last Rate Last Admin   acetaminophen  (TYLENOL ) tablet 500 mg  500 mg Oral Q6H PRN  Shona Laurence N, DO       acetaminophen  (TYLENOL ) tablet 975 mg  975 mg Oral Q8H Teresa Rankin ORN, MD   975 mg at 07/07/24 9447   docusate sodium  (COLACE) capsule 100 mg  100 mg Oral BID Teresa Rankin ORN, MD   100 mg at 07/06/24 2234   enoxaparin  (LOVENOX ) injection 40 mg  40 mg Subcutaneous Q24H Teresa Rankin ORN, MD    40 mg at 07/06/24 9048   glucagon  (human recombinant) (GLUCAGEN) injection 1 mg  1 mg Intravenous PRN Amin, Ankit C, MD       guaiFENesin  (ROBITUSSIN) 100 MG/5ML liquid 5 mL  5 mL Oral Q4H PRN Amin, Ankit C, MD       hydrALAZINE  (APRESOLINE ) injection 10 mg  10 mg Intravenous Q4H PRN Amin, Ankit C, MD       HYDROmorphone  (DILAUDID ) injection 0.5 mg  0.5 mg Intravenous Q4H PRN Teresa Rankin ORN, MD       Influenza vac split trivalent PF (FLUZONE HIGH-DOSE) injection 0.5 mL  0.5 mL Intramuscular Prior to discharge Shona Laurence SAILOR, DO       ipratropium-albuterol  (DUONEB) 0.5-2.5 (3) MG/3ML nebulizer solution 3 mL  3 mL Nebulization Q4H PRN Amin, Ankit C, MD       levothyroxine  (SYNTHROID ) tablet 100 mcg  100 mcg Oral Q0600 Shona Laurence SAILOR, DO   100 mcg at 07/07/24 9447   losartan  (COZAAR ) tablet 25 mg  25 mg Oral Daily Hall, Carole N, DO   25 mg at 07/06/24 9048   melatonin tablet 5 mg  5 mg Oral QHS PRN Shona Laurence SAILOR, DO       methocarbamol  (ROBAXIN ) tablet 500 mg  500 mg Oral Q6H PRN Teresa Rankin ORN, MD   500 mg at 07/05/24 9374   metoCLOPramide  (REGLAN ) tablet 5-10 mg  5-10 mg Oral Q8H PRN Teresa Rankin ORN, MD       Or   metoCLOPramide  (REGLAN ) injection 5-10 mg  5-10 mg Intravenous Q8H PRN Teresa Rankin ORN, MD       metoprolol  tartrate (LOPRESSOR ) injection 5 mg  5 mg Intravenous Q4H PRN Amin, Ankit C, MD       oxyCODONE  (Oxy IR/ROXICODONE ) immediate release tablet 10 mg  10 mg Oral Q4H PRN Teresa Rankin ORN, MD       oxyCODONE  (Oxy IR/ROXICODONE ) immediate release tablet 5 mg  5 mg Oral Q4H PRN Teresa Rankin ORN, MD       polyethylene glycol (MIRALAX  / GLYCOLAX ) packet 17 g  17 g Oral Daily PRN Shona Laurence N, DO       prochlorperazine  (COMPAZINE ) injection 5 mg  5 mg Intravenous Q6H PRN Shona Laurence SAILOR, DO         Discharge Medications: Please see discharge summary for a list of discharge medications.  Relevant Imaging Results:  Relevant Lab Results:   Additional Information SSN:  [id number]  [name], RN

## 2024-07-07 NOTE — TOC Progression Note (Addendum)
 Transition of Care [facility]) - Progression Note    Patient Details  Name: Terri Ortega MRN: [id number] Date of Birth: 10-Oct-1940  Transition of [facility]) CM/SW Contact  [name], RN Phone Number: 07/07/2024, 10:20 AM  Clinical Narrative:  PT eval completed, recommendation for short term rehab/SNF, met with patient at bedside, patient gave NCM verbal permission to caller her daughter, [name], introduced self and role of INPT CM/NCM, [name] agreeable to short term rehab, reports patient had short term rehab at [facility] a couple of years, would prefer her to return there, open to other facilities. FL2 updated, faxed out for bed offers   -10:25: Call to [facility], [phone], ext [phone], reviewed family's request to return for short term rehab, Lonell states she will review and notify NCM with determination ,await call back.   11:00am Call received from [facility] with [facility], bed offer made for short term rehab only,  semi private room.    -12:00pm Call to patient's dtr , [name], informed bed offer from [facility], semi private room, semi private room, [name] accepted .   12:05pm Call to [facility] with [facility], notified family accepted bed offers. NCM will start insurance auth. Lonell confirmed On Call Weekend Nurse - [phone].  -12:23 SNF auth initiated via [facility], Auth ID [id number], shara pending.     Expected Discharge Plan: Skilled Nursing Facility Barriers to Discharge: Continued Medical Work up               Expected Discharge Plan and Services       Living arrangements for the past 2 months: Single Family Home                                       Social Drivers of Health (SDOH) Interventions SDOH Screenings   Food Insecurity: No Food Insecurity (07/04/2024)  Housing: Low Risk  (07/04/2024)  Transportation Needs: No Transportation Needs (07/04/2024)  Utilities: Not At Risk (07/04/2024)  Social Connections: Moderately  Integrated (07/04/2024)  Tobacco Use: Low Risk  (07/05/2024)    Readmission Risk Interventions    07/07/2024   10:15 AM 07/05/2024   11:40 AM  Readmission Risk Prevention Plan  Transportation Screening Complete Complete  PCP or Specialist Appt within 5-7 Days  Complete  PCP or Specialist Appt within 3-5 Days Complete   Home Care Screening  Complete  Medication Review (RN CM)  Complete  HRI or Home Care Consult Complete   Social Work Consult for Recovery Care Planning/Counseling Complete   Medication Review Ocean[employer]) Complete

## 2024-07-08 DIAGNOSIS — Z8781 Personal history of (healed) traumatic fracture: Secondary | ICD-10-CM | POA: Diagnosis not present

## 2024-07-08 LAB — BASIC METABOLIC PANEL WITH GFR
Anion gap: 8 (ref 5–15)
BUN: 35 mg/dL — ABNORMAL HIGH (ref 8–23)
CO2: 29 mmol/L (ref 22–32)
Calcium: 8.6 mg/dL — ABNORMAL LOW (ref 8.9–10.3)
Chloride: 105 mmol/L (ref 98–111)
Creatinine, Ser: 1.01 mg/dL — ABNORMAL HIGH (ref 0.44–1.00)
GFR, Estimated: 55 mL/min — ABNORMAL LOW (ref >60)
Glucose, Bld: 146 mg/dL — ABNORMAL HIGH (ref 70–99)
Potassium: 3.7 mmol/L (ref 3.5–5.1)
Sodium: 141 mmol/L (ref 135–145)

## 2024-07-08 LAB — CBC
HCT: 29 % — ABNORMAL LOW (ref 36.0–46.0)
Hemoglobin: 9.4 g/dL — ABNORMAL LOW (ref 12.0–15.0)
MCH: 30.8 pg (ref 26.0–34.0)
MCHC: 32.4 g/dL (ref 30.0–36.0)
MCV: 95.1 fL (ref 80.0–100.0)
Platelets: 157 K/uL (ref 150–400)
RBC: 3.05 MIL/uL — ABNORMAL LOW (ref 3.87–5.11)
RDW: 13.2 % (ref 11.5–15.5)
WBC: 8.2 K/uL (ref 4.0–10.5)
nRBC: 0 % (ref 0.0–0.2)

## 2024-07-08 LAB — GLUCOSE, CAPILLARY
Glucose-Capillary: 100 mg/dL — ABNORMAL HIGH (ref 70–99)
Glucose-Capillary: 106 mg/dL — ABNORMAL HIGH (ref 70–99)
Glucose-Capillary: 88 mg/dL (ref 70–99)
Glucose-Capillary: 98 mg/dL (ref 70–99)

## 2024-07-08 MED ORDER — POLYETHYLENE GLYCOL 3350 17 G PO PACK
17.0000 g | PACK | Freq: Two times a day (BID) | ORAL | Status: DC
  Administered 2024-07-08 – 2024-07-10 (×5): 17 g via ORAL
  Filled 2024-07-08 (×5): qty 1

## 2024-07-08 MED ORDER — SENNOSIDES-DOCUSATE SODIUM 8.6-50 MG PO TABS
1.0000 | ORAL_TABLET | Freq: Once | ORAL | Status: AC
  Administered 2024-07-08: 1 via ORAL
  Filled 2024-07-08: qty 1

## 2024-07-08 NOTE — Progress Notes (Addendum)
   Subjective:  83 y.o. female now POD 3 from right cephalomedullary nail insertion. Patient reports her mentation is improved and her pain is well controlled  Otherwise, no acute events overnight. No numbness or tingling to the extremity.  Objective:   VITALS:   Vitals:   07/07/24 2143 07/08/24 0441 07/08/24 0500 07/08/24 0825  BP: (!) 125/45 (!) 143/52  (!) 143/55  Pulse: 76 69  69  Resp:  16  17  Temp: 98.2 F (36.8 C) (!) 97.4 F (36.3 C)  98 F (36.7 C)  TempSrc: Oral Oral  Oral  SpO2: 92% 93%  95%  Weight:   91 kg   Height:        Physical Exam: General: Alert, no acute distress Cardiovascular: No pedal edema Respiratory: No cyanosis, no use of accessory musculature GI: No organomegaly, abdomen is soft and non-tender Skin: No lesions in the area of chief complaint Neurologic: Sensation intact distally Psychiatric: Patient is competent for consent with normal mood and affect Lymphatic: No axillary or cervical lymphadenopathy  MUSCULOSKELETAL: right lower extremity - Dressings clean and dry, mild strikethrough over distal dressing - TTP peri incisionally - Motor intact - Sensation intact to light touch sural, saphenous, tibial, deep and superficial peroneal nerve distributions - 2+ DP pulse  Lab Results  Component Value Date   WBC 13.1 (H) 07/07/2024   HGB 8.9 (L) 07/07/2024   HCT 27.6 (L) 07/07/2024   MCV 92.9 07/07/2024   PLT 136 (L) 07/07/2024   BMET    Component Value Date/Time   NA 142 07/06/2024 0030   NA 143 03/12/2023 0845   K 4.1 07/06/2024 0030   CL 106 07/06/2024 0030   CO2 27 07/06/2024 0030   GLUCOSE 161 (H) 07/06/2024 0030   BUN 22 07/06/2024 0030   BUN 19 03/12/2023 0845   CREATININE 1.06 (H) 07/06/2024 0030   CALCIUM  8.7 (L) 07/06/2024 0030   EGFR 59 (L) 03/12/2023 0845   GFRNONAA 52 (L) 07/06/2024 0030     Assessment/Plan: 83 y.o. female  now 3 Days Post-Op from Principal Problem:   S/P right hip fracture .  Patient is doing  well.  The patient may continue to mobilize with physical therapy and may discharge from an orthopedic perspective once medically clear  Weight bearing: as tolerated RLE Pain control: oxy and tylenol  PT/OT DVT ppx: lovenox  inpatient, ASA 81 mg BID x 6 weeks outpatient Bowel regimen: docusate Dispo: pending PT and medical clearance   Rankin ORN Genova Kiner 07/08/2024, 9:20 AM

## 2024-07-08 NOTE — Progress Notes (Signed)
 PROGRESS NOTE    Terri Ortega  FMW:[id number] DOB: [name] 25, 1941 DOA: 07/04/2024 PCP: Okey Carlin Redbird, MD    Brief Narrative:    83 y.o. female with medical history significant for chronic HFpEF, hypertension, hypothyroidism, who presents to the ER after a mechanical fall.  In the ER, hypertensive with SBP in the 200s.  R knee x-ray without evidence of hardware loosening or acute fracture.  Right hip x-ray shows an acute right femoral intratrochanteric fracture.  Underwent IM implant by orthopedic on 12/3.  The patient is resting comfortably. No new complaints. She did have a BM.   Assessment & Plan:   Right hip fracture secondary to mechanical fall, POA Status post ORIF/IM implant 12/3 Postop management including DVT prophylaxis, pain management wound care, weightbearing precautions and follow-up per orthopedic   Leukocytosis, reactive in the setting of hip fracture Reactive.   Hypertension On losartan .  IV as needed   CKD 3B Creatinine around baseline 1.12   Chronic HFpEF, EF 65% Euvolemic on exam Last 2D echo done on 03/27/2024 revealed LVEF 60 to 65% Monitor strict I's and O's and daily weight   Hypothyroidism Continue Synthroid    DVT prophylaxis: SCDs Start: 07/04/24 2123    Code Status: Full Code Family Communication:  Family was at bedside. Status is: Inpatient Remains inpatient appropriate because: On going surgical management.   Diet Orders (From admission, onward)     Start     Ordered   07/05/24 2137  Diet regular Room service appropriate? Yes; Fluid consistency: Thin  Diet effective now       Question Answer Comment  Room service appropriate? Yes   Fluid consistency: Thin      07/05/24 2136            Objective: Vitals:   07/07/24 2143 07/08/24 0441 07/08/24 0500 07/08/24 0825  BP: (!) 125/45 (!) 143/52  (!) 143/55  Pulse: 76 69  69  Resp:  16  17  Temp: 98.2 F (36.8 C) (!) 97.4 F (36.3 C)  98 F (36.7 C)  TempSrc: Oral Oral   Oral  SpO2: 92% 93%  95%  Weight:   91 kg   Height:        Intake/Output Summary (Last 24 hours) at 07/08/2024 1406 Last data filed at 07/08/2024 0825 Gross per 24 hour  Intake 360 ml  Output 500 ml  Net -140 ml   Filed Weights   07/06/24 0500 07/07/24 0500 07/08/24 0500  Weight: 90.4 kg 92.9 kg 91 kg    Scheduled Meds:  acetaminophen   975 mg Oral Q8H   docusate sodium   100 mg Oral BID   enoxaparin  (LOVENOX ) injection  40 mg Subcutaneous Q24H   levothyroxine   100 mcg Oral Q0600   losartan   25 mg Oral Daily   polyethylene glycol  17 g Oral BID     Body mass index is 38.54 kg/m.  Data Reviewed:   CBC: Recent Labs  Lab 07/04/24 2004 07/05/24 0335 07/06/24 0030 07/07/24 0343 07/08/24 0914  WBC 13.9* 12.5* 12.2* 13.1* 8.2  NEUTROABS 12.1* 10.1*  --   --   --   HGB 13.5 11.6* 11.3* 8.9* 9.4*  HCT 41.2 35.6* 34.2* 27.6* 29.0*  MCV 92.4 92.0 93.4 92.9 95.1  PLT 170 167 146* 136* 157   Basic Metabolic Panel: Recent Labs  Lab 07/04/24 2004 07/05/24 0335 07/06/24 0030 07/08/24 0914  NA 143 143 142 141  K 3.6 3.7 4.1 3.7  CL 104 105 106  105  CO2 26 28 27 29   GLUCOSE 129* 137* 161* 146*  BUN 26* 26* 22 35*  CREATININE 1.14* 1.12* 1.06* 1.01*  CALCIUM  9.9 9.3 8.7* 8.6*  MG  --  2.1 1.9  --   PHOS  --  4.2 2.8  --    GFR: Estimated Creatinine Clearance: 42.9 mL/min (A) (by C-G formula based on SCr of 1.01 mg/dL (H)). Liver Function Tests: No results for input(s): AST, ALT, ALKPHOS, BILITOT, PROT, ALBUMIN in the last 168 hours. No results for input(s): LIPASE, AMYLASE in the last 168 hours. Recent Labs  Lab 07/06/24 1906  AMMONIA 17   Coagulation Profile: Recent Labs  Lab 07/04/24 2004  INR 1.0   Cardiac Enzymes: No results for input(s): CKTOTAL, CKMB, CKMBINDEX, TROPONINI in the last 168 hours. BNP (last 3 results) Recent Labs    01/13/24 1020  PROBNP 628   HbA1C: No results for input(s): HGBA1C in the last 72  hours. CBG: Recent Labs  Lab 07/07/24 1202 07/07/24 1915 07/08/24 0049 07/08/24 0622 07/08/24 1223  GLUCAP 138* 108* 100* 88 106*   Lipid Profile: No results for input(s): CHOL, HDL, LDLCALC, TRIG, CHOLHDL, LDLDIRECT in the last 72 hours. Thyroid  Function Tests: Recent Labs    07/06/24 1906  TSH 0.477   Anemia Panel: No results for input(s): VITAMINB12, FOLATE, FERRITIN, TIBC, IRON, RETICCTPCT in the last 72 hours. Sepsis Labs: No results for input(s): PROCALCITON, LATICACIDVEN in the last 168 hours.  Recent Results (from the past 240 hours)  Surgical PCR screen     Status: None   Collection Time: 07/04/24 11:52 PM   Specimen: Nasal Mucosa; Nasal Swab  Result Value Ref Range Status   MRSA, PCR NEGATIVE NEGATIVE Final   Staphylococcus aureus NEGATIVE NEGATIVE Final    Comment: (NOTE) The Xpert SA Assay (FDA approved for NASAL specimens in patients 68 years of age and older), is one component of a comprehensive surveillance program. It is not intended to diagnose infection nor to guide or monitor treatment. Performed at [facility], [address] [place], [place] [address]      Radiology Studies: CT HEAD WO CONTRAST ( ) Result Date: 07/07/2024 EXAM: CT HEAD WITHOUT CONTRAST 07/06/2024 11:58:00 PM TECHNIQUE: CT of the head was performed without the administration of intravenous contrast. Automated exposure control, iterative reconstruction, and/or weight based adjustment of the mA/kV was utilized to reduce the radiation dose to as low as reasonably achievable. COMPARISON: 03/11/2024 CLINICAL HISTORY: Memory loss; Mental status change, unknown cause FINDINGS: BRAIN AND VENTRICLES: No acute hemorrhage. No evidence of acute infarct. No hydrocephalus. No extra-axial collection. No mass effect or midline shift. Patchy and confluent decreased attenuation throughout deep and periventricular white matter of cerebral hemispheres  bilaterally, compatible with chronic microvascular ischemic disease. Cerebral ventricle sizes concordant with cerebral volume loss. Atherosclerotic calcifications within cavernous internal carotid arteries. ORBITS: No acute abnormality. Bilateral lens replacement. SINUSES: No acute abnormality. SOFT TISSUES AND SKULL: No acute soft tissue abnormality. No skull fracture. IMPRESSION: 1. No acute intracranial abnormality. 2. 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 3. Cerebral volume loss with concordant ventricle sizes. Electronically signed by: Franky Stanford MD 07/07/2024 12:04 AM EST RP Workstation: [id number]    LOS: 4 days   I have spent 42 minutes in the evaluation and treatment of this patient.  Brigida Bureau, MD [facility]  If 7PM-7AM, please contact night-coverage  07/08/2024, 2:06 PM

## 2024-07-08 NOTE — Plan of Care (Signed)
  Problem: Education: Goal: Knowledge of General Education information will improve Description: Including pain rating scale, medication(s)/side effects and non-pharmacologic comfort measures Outcome: Progressing   Problem: Health Behavior/Discharge Planning: Goal: Ability to manage health-related needs will improve Outcome: Progressing   Problem: Activity: Goal: Risk for activity intolerance will decrease Outcome: Progressing   Problem: Coping: Goal: Level of anxiety will decrease Outcome: Progressing   Problem: Pain Managment: Goal: General experience of comfort will improve and/or be controlled Outcome: Progressing   Problem: Safety: Goal: Ability to remain free from injury will improve Outcome: Progressing

## 2024-07-08 NOTE — Plan of Care (Signed)

## 2024-07-08 NOTE — Discharge Instructions (Signed)
 Orthopedic surgery discharge instructions:  Bandages: -Maintain postoperative bandage until follow-up appointment.  - Do not use any lotions or creams on or around the incision until instructed by your surgeon.  Showering -  This is waterproof, and you may begin showering on postoperative day #3. You should keep the bandage in place wrap it in plastic wrap at that point prior to showering  - Do not submerge the surgical site underwater.    Activity Restrictions Weight bearing as tolerated right lower extremity  Medications - For mild to moderate pain use Tylenol  as needed around-the-clock, 1000 mg every 8 h for pain. Do not take more than this higher levels can affect kidney function - For breakthrough pain use oxycodone  as necessary. - Please take a stool softener such as docusate and senna while taking a narcotic pain medication - Aspirin  81 mg twice daily x 6 weeks to avoid blood clots  Follow Up:  -You will return to see Dr. Teresa in the office in 2 weeks for routine postoperative check with x-rays. PT will begin after this visit.

## 2024-07-09 LAB — GLUCOSE, CAPILLARY
Glucose-Capillary: 105 mg/dL — ABNORMAL HIGH (ref 70–99)
Glucose-Capillary: 111 mg/dL — ABNORMAL HIGH (ref 70–99)
Glucose-Capillary: 118 mg/dL — ABNORMAL HIGH (ref 70–99)
Glucose-Capillary: 139 mg/dL — ABNORMAL HIGH (ref 70–99)

## 2024-07-09 LAB — BASIC METABOLIC PANEL WITH GFR
Anion gap: 9 (ref 5–15)
BUN: 27 mg/dL — ABNORMAL HIGH (ref 8–23)
CO2: 25 mmol/L (ref 22–32)
Calcium: 8.5 mg/dL — ABNORMAL LOW (ref 8.9–10.3)
Chloride: 104 mmol/L (ref 98–111)
Creatinine, Ser: 0.79 mg/dL (ref 0.44–1.00)
GFR, Estimated: >60 mL/min (ref >60)
Glucose, Bld: 130 mg/dL — ABNORMAL HIGH (ref 70–99)
Potassium: 3.9 mmol/L (ref 3.5–5.1)
Sodium: 139 mmol/L (ref 135–145)

## 2024-07-09 LAB — CBC
HCT: 32.3 % — ABNORMAL LOW (ref 36.0–46.0)
Hemoglobin: 10.4 g/dL — ABNORMAL LOW (ref 12.0–15.0)
MCH: 30.4 pg (ref 26.0–34.0)
MCHC: 32.2 g/dL (ref 30.0–36.0)
MCV: 94.4 fL (ref 80.0–100.0)
Platelets: 196 K/uL (ref 150–400)
RBC: 3.42 MIL/uL — ABNORMAL LOW (ref 3.87–5.11)
RDW: 13.1 % (ref 11.5–15.5)
WBC: 9.2 K/uL (ref 4.0–10.5)
nRBC: 0.2 % (ref 0.0–0.2)

## 2024-07-09 NOTE — Progress Notes (Signed)
 PROGRESS NOTE    Terri Ortega  FMW:[id number] DOB: Sep 04, 1940 DOA: 07/04/2024 PCP: Okey Carlin Redbird, MD    Brief Narrative:    83 y.o. female with medical history significant for chronic HFpEF, hypertension, hypothyroidism, who presents to the ER after a mechanical fall.  In the ER, hypertensive with SBP in the 200s.  R knee x-ray without evidence of hardware loosening or acute fracture.  Right hip x-ray shows an acute right femoral intratrochanteric fracture.  Underwent IM implant by orthopedic on 12/3.  The patient is resting comfortably on 07/09/2024. No new complaints. She did have a BM.  The patient states that she did not get up with PT on 07/08/2024, and has not gotten up today. I have discussed this with nursing who states that they will reach out to PT to make sure that the patient works with them today.    Assessment & Plan:   Right hip fracture secondary to mechanical fall, POA Status post ORIF/IM implant 12/3 Postop management including DVT prophylaxis, pain management wound care, weightbearing precautions and follow-up per orthopedic   Leukocytosis, reactive in the setting of hip fracture Reactive.   Hypertension On losartan .  IV as needed   CKD 3B Creatinine at baseline at 0.79.   Chronic HFpEF, EF 65% Euvolemic on exam Last 2D echo done on 03/27/2024 revealed LVEF 60 to 65% Monitor strict I's and O's and daily weight   Hypothyroidism Continue Synthroid    DVT prophylaxis: SCDs Start: 07/04/24 2123    Code Status: Full Code Family Communication:  Family was at bedside. Status is: Inpatient Remains inpatient appropriate because: On going surgical management.   Diet Orders (From admission, onward)     Start     Ordered   07/05/24 2137  Diet regular Room service appropriate? Yes; Fluid consistency: Thin  Diet effective now       Question Answer Comment  Room service appropriate? Yes   Fluid consistency: Thin      07/05/24 2136             Objective: Vitals:   07/08/24 2113 07/09/24 0604 07/09/24 0720 07/09/24 1406  BP: (!) 174/91 (!) 189/65 (!) 165/54 (!) 159/50  Pulse: 64 65 86 70  Resp: 20 18  17   Temp: 98.6 F (37 C) 99 F (37.2 C)  98.2 F (36.8 C)  TempSrc: Oral   Oral  SpO2: 97% 96% 96% 95%  Weight:      Height:        Intake/Output Summary (Last 24 hours) at 07/09/2024 1439 Last data filed at 07/09/2024 0606 Gross per 24 hour  Intake 620 ml  Output 1150 ml  Net -530 ml   Filed Weights   07/06/24 0500 07/07/24 0500 07/08/24 0500  Weight: 90.4 kg 92.9 kg 91 kg    Scheduled Meds:  acetaminophen   975 mg Oral Q8H   docusate sodium   100 mg Oral BID   enoxaparin  (LOVENOX ) injection  40 mg Subcutaneous Q24H   levothyroxine   100 mcg Oral Q0600   losartan   25 mg Oral Daily   polyethylene glycol  17 g Oral BID     Body mass index is 38.54 kg/m.  Data Reviewed:   CBC: Recent Labs  Lab 07/04/24 2004 07/05/24 0335 07/06/24 0030 07/07/24 0343 07/08/24 0914 07/08/24 2354  WBC 13.9* 12.5* 12.2* 13.1* 8.2 9.2  NEUTROABS 12.1* 10.1*  --   --   --   --   HGB 13.5 11.6* 11.3* 8.9* 9.4* 10.4*  HCT 41.2 35.6* 34.2* 27.6* 29.0* 32.3*  MCV 92.4 92.0 93.4 92.9 95.1 94.4  PLT 170 167 146* 136* 157 196   Basic Metabolic Panel: Recent Labs  Lab 07/04/24 2004 07/05/24 0335 07/06/24 0030 07/08/24 0914 07/08/24 2354  NA 143 143 142 141 139  K 3.6 3.7 4.1 3.7 3.9  CL 104 105 106 105 104  CO2 26 28 27 29 25   GLUCOSE 129* 137* 161* 146* 130*  BUN 26* 26* 22 35* 27*  CREATININE 1.14* 1.12* 1.06* 1.01* 0.79  CALCIUM  9.9 9.3 8.7* 8.6* 8.5*  MG  --  2.1 1.9  --   --   PHOS  --  4.2 2.8  --   --    GFR: Estimated Creatinine Clearance: 54.2 mL/min (by C-G formula based on SCr of 0.79 mg/dL). Liver Function Tests: No results for input(s): AST, ALT, ALKPHOS, BILITOT, PROT, ALBUMIN in the last 168 hours. No results for input(s): LIPASE, AMYLASE in the last 168 hours. Recent Labs  Lab  07/06/24 1906  AMMONIA 17   Coagulation Profile: Recent Labs  Lab 07/04/24 2004  INR 1.0   Cardiac Enzymes: No results for input(s): CKTOTAL, CKMB, CKMBINDEX, TROPONINI in the last 168 hours. BNP (last 3 results) Recent Labs    01/13/24 1020  PROBNP 628   HbA1C: No results for input(s): HGBA1C in the last 72 hours. CBG: Recent Labs  Lab 07/08/24 1223 07/08/24 1809 07/08/24 2359 07/09/24 0605 07/09/24 1149  GLUCAP 106* 98 139* 118* 105*   Lipid Profile: No results for input(s): CHOL, HDL, LDLCALC, TRIG, CHOLHDL, LDLDIRECT in the last 72 hours. Thyroid  Function Tests: Recent Labs    07/06/24 1906  TSH 0.477   Anemia Panel: No results for input(s): VITAMINB12, FOLATE, FERRITIN, TIBC, IRON, RETICCTPCT in the last 72 hours. Sepsis Labs: No results for input(s): PROCALCITON, LATICACIDVEN in the last 168 hours.  Recent Results (from the past 240 hours)  Surgical PCR screen     Status: None   Collection Time: 07/04/24 11:52 PM   Specimen: Nasal Mucosa; Nasal Swab  Result Value Ref Range Status   MRSA, PCR NEGATIVE NEGATIVE Final   Staphylococcus aureus NEGATIVE NEGATIVE Final    Comment: (NOTE) The Xpert SA Assay (FDA approved for NASAL specimens in patients 16 years of age and older), is one component of a comprehensive surveillance program. It is not intended to diagnose infection nor to guide or monitor treatment. Performed at [facility], [address] [place], [place] [address]      Radiology Studies: No results found.   LOS: 5 days   I have spent 38 minutes in the evaluation and treatment of this patient.   Brigida Bureau, MD [facility]  If 7PM-7AM, please contact night-coverage  07/09/2024, 2:39 PM

## 2024-07-10 LAB — GLUCOSE, CAPILLARY
Glucose-Capillary: 107 mg/dL — ABNORMAL HIGH (ref 70–99)
Glucose-Capillary: 113 mg/dL — ABNORMAL HIGH (ref 70–99)
Glucose-Capillary: 126 mg/dL — ABNORMAL HIGH (ref 70–99)
Glucose-Capillary: 129 mg/dL — ABNORMAL HIGH (ref 70–99)

## 2024-07-10 MED ORDER — OXYCODONE HCL 5 MG PO TABS: 5.0000 mg | ORAL_TABLET | Freq: Four times a day (QID) | ORAL | 0 refills | Status: DC | PRN

## 2024-07-10 MED ORDER — ACETAMINOPHEN 500 MG PO TABS: 1000.0000 mg | ORAL_TABLET | Freq: Three times a day (TID) | ORAL | Status: AC

## 2024-07-10 MED ORDER — ASPIRIN 81 MG PO TBEC: 81.0000 mg | DELAYED_RELEASE_TABLET | Freq: Two times a day (BID) | ORAL | 0 refills | Status: AC

## 2024-07-10 MED ORDER — POLYETHYLENE GLYCOL 3350 17 G PO PACK: 17.0000 g | PACK | Freq: Every day | ORAL | 0 refills | Status: AC | PRN

## 2024-07-10 MED ORDER — DOCUSATE SODIUM 100 MG PO CAPS: 100.0000 mg | ORAL_CAPSULE | Freq: Two times a day (BID) | ORAL | 0 refills | Status: DC

## 2024-07-10 NOTE — Care Management Important Message (Signed)
 Important Message  Patient Details IM Letter given Name: Terri Ortega MRN: [id number] Date of Birth: Oct 20, 1940   Important Message Given:  Yes - Medicare IM     Terri Ortega 07/10/2024, 12:18 PM

## 2024-07-10 NOTE — Progress Notes (Signed)
 Two attempts made to call report to [facility]. Left voicemail with return number.  Donzell Barge, RN

## 2024-07-10 NOTE — TOC Progression Note (Signed)
 Transition of Care [facility]) - Progression Note    Patient Details  Name: Terri Ortega MRN: [id number] Date of Birth: [birth year]  [facility] of [facility]) CM/SW Contact  [name], RN Phone Number: 07/10/2024, 9:24 AM  Clinical Narrative:   Per [facility] auth approved . Plan auth ID [id number] Auth ID [id number], day approved  07/08/2024-07/11/2024     Expected Discharge Plan: Skilled Nursing Facility Barriers to Discharge: Continued Medical Work up               Expected Discharge Plan and Services       Living arrangements for the past 2 months: Single Family Home                                       Social Drivers of Health (SDOH) Interventions SDOH Screenings   Food Insecurity: No Food Insecurity (07/04/2024)  Housing: Low Risk  (07/04/2024)  Transportation Needs: No Transportation Needs (07/04/2024)  Utilities: Not At Risk (07/04/2024)  Social Connections: Moderately Integrated (07/04/2024)  Tobacco Use: Low Risk  (07/05/2024)    Readmission Risk Interventions    07/07/2024   10:15 AM 07/05/2024   11:40 AM  Readmission Risk Prevention Plan  Transportation Screening Complete Complete  PCP or Specialist Appt within 5-7 Days  Complete  PCP or Specialist Appt within 3-5 Days Complete   Home Care Screening  Complete  Medication Review (RN CM)  Complete  HRI or Home Care Consult Complete   Social Work Consult for Recovery Care Planning/Counseling Complete   Medication Review Oceanographer) Complete

## 2024-07-10 NOTE — Progress Notes (Signed)
 Physical Therapy Treatment Patient Details Name: Terri Ortega MRN: [id number] DOB: 1940-08-11 Today's Date: 07/10/2024   History of Present Illness Patient is an 83 y/o female admitted 07/04/24 after fall with R hip IT fx.  Underwent IM nail on 07/05/24.  PMH positive for CHF, HTN, uterine cancer s/p surgery, XRT, hypothyroidism, L THA, R TKA x 2 and mild dementia.    PT Comments  Pt admitted with above diagnosis.  Pt currently with functional limitations due to the deficits listed below (see PT Problem List). Pt in bed when PT arrived. Pt agreeable to therapy intervention. Pt reports no pain at rest. Pt required mod A x 2 for supine to sit, mod A x 2 for sit to stand  from EOB x 5 and SPT bed to recliner. Pt left seated in recliner, all needs in place.  Patient will benefit from continued inpatient follow up therapy, <3 hours/day.  Pt will benefit from acute skilled PT to increase their independence and safety with mobility to allow discharge.      If plan is discharge home, recommend the following: Two people to help with bathing/dressing/bathroom;Assist for transportation;Direct supervision/assist for medications management;Supervision due to cognitive status;Two people to help with walking and/or transfers   Can travel by private vehicle     No  Equipment Recommendations  None recommended by PT    Recommendations for Other Services       Precautions / Restrictions Precautions Precautions: Fall Recall of Precautions/Restrictions: Impaired Restrictions Weight Bearing Restrictions Per Provider Order: Yes RLE Weight Bearing Per Provider Order: Weight bearing as tolerated     Mobility  Bed Mobility Overal bed mobility: Needs Assistance Bed Mobility: Sit to Supine     Supine to sit: +2 for physical assistance, Mod assist, +2 for safety/equipment, HOB elevated, Used rails     General bed mobility comments: increased time, mulitmodal cues, use of bed pad to scoot to EOB     Transfers Overall transfer level: Needs assistance Equipment used: Rolling walker (2 wheels) Transfers: Sit to/from Stand, Bed to chair/wheelchair/BSC Sit to Stand: +2 physical assistance, +2 safety/equipment, Mod assist Stand pivot transfers: Mod assist, +2 physical assistance, +2 safety/equipment         General transfer comment: mod A x 2 for sit to stand from EOB pullting to RW, multiple bouts (x 5) for assist with hygiene pt unaware of voiding bowels during moility task and pt then able to complte SPT bed to recliner    Ambulation/Gait               General Gait Details: NT pt too fatigued and reporting R LE pain with multiple sit to stand prior to SPT to recliner   Stairs             Wheelchair Mobility     Tilt Bed    Modified Rankin (Stroke Patients Only)       Balance Overall balance assessment: Needs assistance Sitting-balance support: Bilateral upper extremity supported, Feet supported Sitting balance-Leahy Scale: Fair Sitting balance - Comments: significant posterior lean, not able to sit EOB with at least one hand supported   Standing balance support: Bilateral upper extremity supported, During functional activity, Reliant on assistive device for balance Standing balance-Leahy Scale: Poor Standing balance comment: B UE support at RW with min A and cues for extension posture and encouragement for prolonged standing  Communication Communication Communication: Impaired Factors Affecting Communication: Difficulty expressing self  Cognition Arousal: Alert Behavior During Therapy: Flat affect   PT - Cognitive impairments: History of cognitive impairments, No family/caregiver present to determine baseline, Problem solving, Attention, Sequencing, Memory                       PT - Cognition Comments: flat affect, increased time to follow commands, required verbal/tactile cues; oriented to self and  location, recent fall, spouse's name --not to month/year or surgical repair Following commands: Impaired Following commands impaired: Follows one step commands inconsistently, Follows one step commands with increased time    Cueing Cueing Techniques: Verbal cues, Tactile cues, Visual cues, Gestural cues  Exercises      General Comments General comments (skin integrity, edema, etc.): no reports of dizziness or SOB, pt reported R LE pain with sit to stand and SPT      Pertinent Vitals/Pain Pain Assessment Pain Assessment: Faces Faces Pain Scale: Hurts even more Pain Location: R hip with movement Pain Descriptors / Indicators: Grimacing, Guarding, Moaning Pain Intervention(s): Limited activity within patient's tolerance, Monitored during session, Premedicated before session, Repositioned, Ice applied    Home Living                          Prior Function            PT Goals (current goals can now be found in the care plan section) Acute Rehab PT Goals Patient Stated Goal: unable to state PT Goal Formulation: Patient unable to participate in goal setting Time For Goal Achievement: 07/20/24 Potential to Achieve Goals: Fair Progress towards PT goals: Progressing toward goals    Frequency    Min 3X/week      PT Plan      Co-evaluation              AM-PAC PT 6 Clicks Mobility   Outcome Measure  Help needed turning from your back to your side while in a flat bed without using bedrails?: A Lot Help needed moving from lying on your back to sitting on the side of a flat bed without using bedrails?: A Lot Help needed moving to and from a bed to a chair (including a wheelchair)?: A Lot Help needed standing up from a chair using your arms (e.g., wheelchair or bedside chair)?: A Lot Help needed to walk in hospital room?: Total Help needed climbing 3-5 steps with a railing? : Total 6 Click Score: 10    End of Session Equipment Utilized During Treatment:  Gait belt Activity Tolerance: Patient limited by fatigue Patient left: in chair;with call bell/phone within reach;with chair alarm set Nurse Communication: Mobility status (A  x 2 for transfers) PT Visit Diagnosis: History of falling (Z91.81);Muscle weakness (generalized) (M62.81);Other symptoms and signs involving the nervous system (R29.898);Difficulty in walking, not elsewhere classified (R26.2);Pain Pain - Right/Left: Right Pain - part of body: Hip     Time: 1027-1058 PT Time Calculation (min) (ACUTE ONLY): 31 min  Charges:    $Therapeutic Activity: 23-37 mins PT General Charges $$ ACUTE PT VISIT: 1 Visit                     [place], PT Acute Rehab    [place] VEAR Drone 07/10/2024, 12:32 PM

## 2024-07-10 NOTE — Discharge Summary (Signed)
 Physician Discharge Summary  Terri Ortega FMW:[id number] DOB: [birth year] DOA: 07/04/2024  PCP: Okey Carlin Redbird, MD  Admit date: 07/04/2024 Discharge date: 07/10/2024  Admitted From: Home Disposition: SNF  Recommendations for Outpatient Follow-up:  Follow up with SNF provider at earliest convenience Outpatient follow-up with orthopedics.  Discharge wound care/activity/pain management/DVT prophylaxis as per orthopedics recommendations  follow up in ED if symptoms worsen or new appear   Home Health: No Equipment/Devices: None  Discharge Condition: Stable CODE STATUS: Full Diet recommendation: Heart healthy  Brief/Interim Summary: 83 year old female with history of chronic diastolic heart failure, hypertension, hypothyroidism presented with a mechanical fall.  She was found to have acute right femoral intertrochanteric fracture.  She underwent surgical intervention by orthopedics on 07/05/2024.  Subsequently, PT recommended SNF placement.  She is currently medically stable for discharge to SNF.  She will be discharged to SNF once bed is available.  Discharge Diagnoses:   Right hip fracture secondary to mechanical fall -Status post surgical intervention by orthopedics on 07/05/2024. -Subsequently, PT recommended SNF placement.  She is currently medically stable for discharge to SNF.  She will be discharged to SNF once bed is available. -Outpatient follow-up with orthopedics.  Discharge wound care/activity/pain management/DVT prophylaxis as per orthopedics recommendations.  Orthopedics recommended aspirin  81 mg twice a day for 6 weeks for DVT prophylaxis.  Leukocytosis - Resolved  Hypertension - Resume home regimen  CKD stage IIIb - Creatinine stable.  Monitor outpatient  Chronic diastolic heart failure - Currently compensated.  Last echo on 03/27/2024 showed EF of 60 to 65%.  Continue diet and fluid restriction.  Continue losartan  and hydrochlorothiazide along with as  needed Lasix .  Outpatient follow-up with cardiology  Hypothyroidism -continue Synthroid   Obesity class II - Outpatient follow-up   Discharge Instructions  Discharge Instructions     Diet - low sodium heart healthy   Complete by: As directed    Discharge wound care:   Complete by: As directed    As per ortho recs   Increase activity slowly   Complete by: As directed       Allergies as of 07/10/2024       Reactions   Percocet [oxycodone -acetaminophen ] Nausea And Vomiting        Medication List     STOP taking these medications    amLODipine  2.5 MG tablet Commonly known as: NORVASC    ibandronate 150 MG tablet Commonly known as: BONIVA   traMADol  50 MG tablet Commonly known as: ULTRAM        TAKE these medications    acetaminophen  500 MG tablet Commonly known as: TYLENOL  Take 2 tablets (1,000 mg total) by mouth every 8 (eight) hours.   calcium  carbonate 1500 (600 Ca) MG Tabs tablet Commonly known as: OSCAL Take 1,500 mg by mouth daily.   Citracal +D3 Tabs Take 1 tablet by mouth daily.   donepezil  10 MG tablet Commonly known as: ARICEPT  Take 1 tablet (10 mg total) by mouth at bedtime.   furosemide  20 MG tablet Commonly known as: LASIX  Take 1 tablet (20 mg total) by mouth daily as needed for fluid or edema. What changed: when to take this   hydrochlorothiazide 12.5 MG tablet Commonly known as: HYDRODIURIL Take 12.5 mg by mouth every morning.   losartan  25 MG tablet Commonly known as: COZAAR  Take 25 mg by mouth daily.   magnesium  gluconate 500 (27 Mg) MG Tabs tablet Commonly known as: MAGONATE Take 500 mg by mouth daily.   polyethylene glycol 17 g packet  Commonly known as: MIRALAX  / GLYCOLAX  Take 17 g by mouth daily as needed for mild constipation.   Synthroid  100 MCG tablet Generic drug: levothyroxine  1 tablet in the morning on an empty stomach Orally Once a day; Duration: 90 days What changed: Another medication with the same name was  removed. Continue taking this medication, and follow the directions you see here.   VITAMIN D3 PO Take 2,000 Int'l Units by mouth daily.               Discharge Care Instructions  (From admission, onward)           Start     Ordered   07/10/24 0000  Discharge wound care:       Comments: As per ortho recs   07/10/24 0936            Contact information for follow-up providers     Teresa Rankin ORN, MD Follow up in 2 week(s).   Specialty: Orthopedic Surgery Contact information: [address] [place] [address] [phone]              Contact information for after-discharge care     Destination     [facility] .   Service: Skilled Nursing Contact information: [address] [place]  [address] [phone]                    Allergies  Allergen Reactions   Percocet [Oxycodone -Acetaminophen ] Nausea And Vomiting    Consultations: Orthopedics   Procedures/Studies: CT HEAD WO CONTRAST ( ) Result Date: 07/07/2024 EXAM: CT HEAD WITHOUT CONTRAST 07/06/2024 11:58:00 PM TECHNIQUE: CT of the head was performed without the administration of intravenous contrast. Automated exposure control, iterative reconstruction, and/or weight based adjustment of the mA/kV was utilized to reduce the radiation dose to as low as reasonably achievable. COMPARISON: 03/11/2024 CLINICAL HISTORY: Memory loss; Mental status change, unknown cause FINDINGS: BRAIN AND VENTRICLES: No acute hemorrhage. No evidence of acute infarct. No hydrocephalus. No extra-axial collection. No mass effect or midline shift. Patchy and confluent decreased attenuation throughout deep and periventricular white matter of cerebral hemispheres bilaterally, compatible with chronic microvascular ischemic disease. Cerebral ventricle sizes concordant with cerebral volume loss. Atherosclerotic calcifications within cavernous internal carotid arteries. ORBITS: No  acute abnormality. Bilateral lens replacement. SINUSES: No acute abnormality. SOFT TISSUES AND SKULL: No acute soft tissue abnormality. No skull fracture. IMPRESSION: 1. No acute intracranial abnormality. 2. 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 3. Cerebral volume loss with concordant ventricle sizes. Electronically signed by: Franky Stanford MD 07/07/2024 12:04 AM EST RP Workstation: [id number]   DG HIP UNILAT WITH PELVIS 2-3 VIEWS RIGHT Result Date: 07/05/2024 CLINICAL DATA:  Elective surgery EXAM: DG HIP (WITH OR WITHOUT PELVIS) 2-3V*R* COMPARISON:  Right hip x-ray 07/04/2024 FINDINGS: Ten intraoperative fluoroscopic views of the right hip were submitted. Right hip screw and intramedullary nail were placed fixating in intratrochanteric fracture. Alignment is anatomic. Fluoroscopy time 1 minutes 14 seconds. Fluoroscopy dose 16.34 micro gray. IMPRESSION: Intraoperative fluoroscopic views of the right hip. Electronically Signed   By: Greig Pique M.D.   On: 07/05/2024 23:47   DG C-Arm 1-60 Min-No Report Result Date: 07/05/2024 Fluoroscopy was utilized by the requesting physician.  No radiographic interpretation.   DG C-Arm 1-60 Min-No Report Result Date: 07/05/2024 Fluoroscopy was utilized by the requesting physician.  No radiographic interpretation.   DG Knee 2 Views Right Result Date:  07/04/2024 CLINICAL DATA:  Fall, knee pain EXAM: RIGHT KNEE - 1-2 VIEW COMPARISON:  None Available. FINDINGS: Right knee total arthroplasty present. No evidence for hardware loosening or acute fracture. Joint spaces are maintained and alignment is anatomic. Soft tissues are within normal limits. IMPRESSION: Right knee total arthroplasty without evidence for hardware loosening or acute fracture. Electronically Signed   By: Greig Pique M.D.   On: 07/04/2024 19:50   DG Hip Unilat  With Pelvis 2-3 Views Right Result Date:  07/04/2024 CLINICAL DATA:  Pain after fall EXAM: DG HIP (WITH OR WITHOUT PELVIS) 2-3V RIGHT COMPARISON:  None Available. FINDINGS: There is an acute right femoral intratrochanteric fracture which is nondisplaced. The lesser trochanter is a free fracture fragment displaced medially 1 cm. There is no dislocation. Left hip arthroplasty appears in anatomic alignment. There are degenerative changes of the lumbar spine. IMPRESSION: Acute right femoral intratrochanteric fracture. Electronically Signed   By: Greig Pique M.D.   On: 07/04/2024 19:41      Subjective: Patient seen and examined at bedside.  Complains of intermittent right hip pain.  No fever or vomiting reported.  Discharge Exam: Vitals:   07/09/24 2238 07/10/24 0525  BP: (!) 170/56 (!) 167/58  Pulse: 71 64  Resp: 18 18  Temp: 99.5 F (37.5 C) 98.2 F (36.8 C)  SpO2: 94% 96%    General: Pt is alert, awake, not in acute distress.  On room air. Cardiovascular: rate controlled, S1/S2 + Respiratory: bilateral decreased breath sounds at bases Abdominal: Soft, obese, NT, ND, bowel sounds + Extremities: no edema, no cyanosis    The results of significant diagnostics from this hospitalization (including imaging, microbiology, ancillary and laboratory) are listed below for reference.     Microbiology: Recent Results (from the past 240 hours)  Surgical PCR screen     Status: None   Collection Time: 07/04/24 11:52 PM   Specimen: Nasal Mucosa; Nasal Swab  Result Value Ref Range Status   MRSA, PCR NEGATIVE NEGATIVE Final   Staphylococcus aureus NEGATIVE NEGATIVE Final    Comment: (NOTE) The Xpert SA Assay (FDA approved for NASAL specimens in patients 57 years of age and older), is one component of a comprehensive surveillance program. It is not intended to diagnose infection nor to guide or monitor treatment. Performed at [facility], [address], [place], [place] [address]      Labs: BNP (last 3  results) No results for input(s): BNP in the last 8760 hours. Basic Metabolic Panel: Recent Labs  Lab 07/04/24 2004 07/05/24 0335 07/06/24 0030 07/08/24 0914 07/08/24 2354  NA 143 143 142 141 139  K 3.6 3.7 4.1 3.7 3.9  CL 104 105 106 105 104  CO2 26 28 27 29 25   GLUCOSE 129* 137* 161* 146* 130*  BUN 26* 26* 22 35* 27*  CREATININE 1.14* 1.12* 1.06* 1.01* 0.79  CALCIUM  9.9 9.3 8.7* 8.6* 8.5*  MG  --  2.1 1.9  --   --   PHOS  --  4.2 2.8  --   --    Liver Function Tests: No results for input(s): AST, ALT, ALKPHOS, BILITOT, PROT, ALBUMIN in the last 168 hours. No results for input(s): LIPASE, AMYLASE in the last 168 hours. Recent Labs  Lab 07/06/24 1906  AMMONIA 17   CBC: Recent Labs  Lab 07/04/24 2004 07/05/24 0335 07/06/24 0030 07/07/24 0343 07/08/24 0914 07/08/24 2354  WBC 13.9* 12.5* 12.2* 13.1* 8.2 9.2  NEUTROABS 12.1* 10.1*  --   --   --   --  HGB 13.5 11.6* 11.3* 8.9* 9.4* 10.4*  HCT 41.2 35.6* 34.2* 27.6* 29.0* 32.3*  MCV 92.4 92.0 93.4 92.9 95.1 94.4  PLT 170 167 146* 136* 157 196   Cardiac Enzymes: No results for input(s): CKTOTAL, CKMB, CKMBINDEX, TROPONINI in the last 168 hours. BNP: Invalid input(s): POCBNP CBG: Recent Labs  Lab 07/09/24 1149 07/09/24 1802 07/10/24 0008 07/10/24 0529 07/10/24 0754  GLUCAP 105* 111* 126* 113* 107*   D-Dimer No results for input(s): DDIMER in the last 72 hours. Hgb A1c No results for input(s): HGBA1C in the last 72 hours. Lipid Profile No results for input(s): CHOL, HDL, LDLCALC, TRIG, CHOLHDL, LDLDIRECT in the last 72 hours. Thyroid  function studies No results for input(s): TSH, T4TOTAL, T3FREE, THYROIDAB in the last 72 hours.  Invalid input(s): FREET3 Anemia work up No results for input(s): VITAMINB12, FOLATE, FERRITIN, TIBC, IRON, RETICCTPCT in the last 72 hours. Urinalysis    Component Value Date/Time   COLORURINE YELLOW 07/07/2024  1654   APPEARANCEUR CLEAR 07/07/2024 1654   LABSPEC 1.025 07/07/2024 1654   PHURINE 5.0 07/07/2024 1654   GLUCOSEU NEGATIVE 07/07/2024 1654   HGBUR NEGATIVE 07/07/2024 1654   BILIRUBINUR NEGATIVE 07/07/2024 1654   KETONESUR NEGATIVE 07/07/2024 1654   PROTEINUR NEGATIVE 07/07/2024 1654   UROBILINOGEN 0.2 10/31/2014 0134   NITRITE NEGATIVE 07/07/2024 1654   LEUKOCYTESUR NEGATIVE 07/07/2024 1654   Sepsis Labs Recent Labs  Lab 07/06/24 0030 07/07/24 0343 07/08/24 0914 07/08/24 2354  WBC 12.2* 13.1* 8.2 9.2   Microbiology Recent Results (from the past 240 hours)  Surgical PCR screen     Status: None   Collection Time: 07/04/24 11:52 PM   Specimen: Nasal Mucosa; Nasal Swab  Result Value Ref Range Status   MRSA, PCR NEGATIVE NEGATIVE Final   Staphylococcus aureus NEGATIVE NEGATIVE Final    Comment: (NOTE) The Xpert SA Assay (FDA approved for NASAL specimens in patients 84 years of age and older), is one component of a comprehensive surveillance program. It is not intended to diagnose infection nor to guide or monitor treatment. Performed at [facility], [address] [place], [place] [address]      Time coordinating discharge: 35 minutes  SIGNED:   Sophie Mao, MD  [facility] 07/10/2024, 9:36 AM

## 2024-07-10 NOTE — NC FL2 (Signed)
 [place]  MEDICAID FL2 LEVEL OF CARE FORM     IDENTIFICATION  Patient Name: Terri Ortega Birthdate: 1941-07-04 Sex: female Admission Date (Current Location): 07/04/2024  [facility] and [place] Number:  Producer, Television/film/video and Address:  [facility],  [address] [place], [place] [address]      Provider Number: [id number]  Attending Physician Name and Address:  Cheryle Page, MD  Relative Name and Phone Number:  OKEY HEINRICH (Daughter)  [phone] (Home Phone)    Current Level of Care: Hospital Recommended Level of Care: Skilled Nursing Facility Prior Approval Number:    Date Approved/Denied:   PASRR Number: [id number]  Discharge Plan: SNF    Current Diagnoses: Patient Active Problem List   Diagnosis Date Noted   S/P right hip fracture 07/04/2024   Leg swelling 01/12/2024   Nausea 04/27/2022   Blood loss anemia 04/24/2022   Slow transit constipation 04/24/2022   Subcapital fracture of femur, left, closed, initial encounter (HCC) 04/15/2022   Fall at home, initial encounter 04/15/2022   CKD (chronic kidney disease) stage 3, GFR 30-59 ml/min (HCC) 04/15/2022   Hypertensive urgency 04/15/2022   Osteoporosis 04/15/2022   Aortic valve disease 07/10/2021   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   Orthostatic dizziness 11/14/2020   Educated about COVID-19 virus infection 12/21/2019   Mild cognitive impairment 09/06/2019   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   Preop cardiovascular exam 12/20/2018   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   Uterine cancer (HCC) 03/28/2018   Arthralgia of hip or thigh 03/28/2018   Benign essential HTN 03/28/2018   Overweight 03/28/2018   Heart disease 11/09/2014   Leukocytosis 10/31/2014   Heart failure with improved ejection fraction (HFimpEF) (HCC) 10/30/2014   Hypothyroidism 10/30/2014   Chest pain at  rest 10/30/2014   Chest pain 10/30/2014   Endometrial cancer (HCC) 01/30/2014   Dyspnea 01/11/2014   Edema 01/11/2014   Low back pain with right-sided sciatica 12/13/2013   Degeneration of lumbar or lumbosacral intervertebral disc 12/13/2013   Sciatica 12/13/2013    Orientation RESPIRATION BLADDER Height & Weight     Self, Place  Normal Incontinent, Indwelling catheter Weight: 92.4 kg Height:  5' 0.5 (153.7 cm)  BEHAVIORAL SYMPTOMS/MOOD NEUROLOGICAL BOWEL NUTRITION STATUS      Incontinent Diet (regular)  AMBULATORY STATUS COMMUNICATION OF NEEDS Skin   Extensive Assist Verbally Surgical wounds (Right femur IM Nail)                       Personal Care Assistance Level of Assistance  Bathing, Feeding, Dressing Bathing Assistance: Limited assistance Feeding assistance: Limited assistance Dressing Assistance: Limited assistance     Functional Limitations Info  Sight, Hearing, Speech Sight Info: Impaired (eyeglasses) Hearing Info: Adequate Speech Info: Adequate    SPECIAL CARE FACTORS FREQUENCY  PT (By licensed PT), OT (By licensed OT)     PT Frequency: 5x/wk OT Frequency: 5x/wk            Contractures Contractures Info: Not present    Additional Factors Info  Code Status, Allergies, Psychotropic Code Status Info: Full Code Allergies Info: Percocet (Oxycodone -acetaminophen ) Psychotropic Info: N/A         Current Medications (07/10/2024):  This is the current hospital active medication list Current Facility-Administered Medications  Medication Dose Route Frequency Provider Last Rate Last Admin   acetaminophen  (TYLENOL ) tablet 500 mg  500 mg Oral Q6H PRN  Shona Laurence N, DO       acetaminophen  (TYLENOL ) tablet 975 mg  975 mg Oral Q8H Teresa Rankin ORN, MD   975 mg at 07/10/24 0516   docusate sodium  (COLACE) capsule 100 mg  100 mg Oral BID Teresa Rankin ORN, MD   100 mg at 07/09/24 2144   enoxaparin  (LOVENOX ) injection 40 mg  40 mg Subcutaneous Q24H Teresa Rankin ORN, MD   40 mg at 07/09/24 9045   glucagon  (human recombinant) (GLUCAGEN) injection 1 mg  1 mg Intravenous PRN Amin, Ankit C, MD       guaiFENesin  (ROBITUSSIN) 100 MG/5ML liquid 5 mL  5 mL Oral Q4H PRN Amin, Ankit C, MD       hydrALAZINE  (APRESOLINE ) injection 10 mg  10 mg Intravenous Q4H PRN Amin, Ankit C, MD   10 mg at 07/09/24 0636   HYDROmorphone  (DILAUDID ) injection 0.5 mg  0.5 mg Intravenous Q4H PRN Teresa Rankin ORN, MD       Influenza vac split trivalent PF (FLUZONE HIGH-DOSE) injection 0.5 mL  0.5 mL Intramuscular Prior to discharge Shona Laurence SAILOR, DO       ipratropium-albuterol  (DUONEB) 0.5-2.5 (3) MG/3ML nebulizer solution 3 mL  3 mL Nebulization Q4H PRN Amin, Ankit C, MD       levothyroxine  (SYNTHROID ) tablet 100 mcg  100 mcg Oral Q0600 Shona Laurence N, DO   100 mcg at 07/10/24 0516   losartan  (COZAAR ) tablet 25 mg  25 mg Oral Daily Shona Laurence N, DO   25 mg at 07/09/24 9045   melatonin tablet 5 mg  5 mg Oral QHS PRN Shona Laurence N, DO   5 mg at 07/09/24 2144   methocarbamol  (ROBAXIN ) tablet 500 mg  500 mg Oral Q6H PRN Teresa Rankin ORN, MD   500 mg at 07/05/24 0625   metoCLOPramide  (REGLAN ) tablet 5-10 mg  5-10 mg Oral Q8H PRN Teresa Rankin ORN, MD       Or   metoCLOPramide  (REGLAN ) injection 5-10 mg  5-10 mg Intravenous Q8H PRN Teresa Rankin ORN, MD       metoprolol  tartrate (LOPRESSOR ) injection 5 mg  5 mg Intravenous Q4H PRN Amin, Ankit C, MD       oxyCODONE  (Oxy IR/ROXICODONE ) immediate release tablet 10 mg  10 mg Oral Q4H PRN Teresa Rankin ORN, MD       oxyCODONE  (Oxy IR/ROXICODONE ) immediate release tablet 5 mg  5 mg Oral Q4H PRN Teresa Rankin ORN, MD       polyethylene glycol (MIRALAX  / GLYCOLAX ) packet 17 g  17 g Oral BID Swayze, Ava, DO   17 g at 07/09/24 2144   prochlorperazine  (COMPAZINE ) injection 5 mg  5 mg Intravenous Q6H PRN Shona Laurence SAILOR, DO         Discharge Medications: Please see discharge summary for a list of discharge medications.  Relevant Imaging Results:  Relevant  Lab Results:   Additional Information SSN: [id number]  [name], RN

## 2024-07-10 NOTE — TOC Transition Note (Signed)
 Transition of Care [facility]) - Discharge Note   Patient Details  Name: Terri Ortega MRN: [id number] Date of Birth: [birth year]  Transition of Care [facility]) CM/SW Contact:  [name], RN Phone Number: 07/10/2024, 1:18 PM   Clinical Narrative:   Dc order to SNF, [facility]. RM 60A, pt's dtr, jan, notified. PTAR for transport. No further INPT CM identified at this time.     Final next level of care: Skilled Nursing Facility Barriers to Discharge: Barriers Resolved   Patient Goals and CMS Choice Patient states their goals for this hospitalization and ongoing recovery are:: return home CMS Medicare.gov Compare Post Acute Care list provided to:: Patient Represenative (must comment) SHERI HEINRICH (Daughter)  [phone] [facility] Phone)) Choice offered to / list presented to : Adult Children [place] ownership interest in [facility].provided to:: Adult Children    Discharge Placement              Patient chooses bed at: [facility] Patient to be transferred to facility by: PTAR Name of family member notified: OKEY HEINRICH (Daughter)  [phone] [facility] Phone) Patient and family notified of of transfer: 07/10/24  Discharge Plan and Services Additional resources added to the After Visit Summary for                                       Social Drivers of Health (SDOH) Interventions SDOH Screenings   Food Insecurity: No Food Insecurity (07/04/2024)  Housing: Low Risk  (07/04/2024)  Transportation Needs: No Transportation Needs (07/04/2024)  Utilities: Not At Risk (07/04/2024)  Social Connections: Moderately Integrated (07/04/2024)  Tobacco Use: Low Risk  (07/05/2024)     Readmission Risk Interventions    07/10/2024    1:17 PM 07/07/2024   10:15 AM 07/05/2024   11:40 AM  Readmission Risk Prevention Plan  Transportation Screening Complete Complete Complete  PCP or Specialist Appt within 5-7 Days   Complete  PCP or Specialist Appt within  3-5 Days Complete Complete   Home Care Screening   Complete  Medication Review (RN CM)   Complete  HRI or Home Care Consult Complete Complete   Social Work Consult for Recovery Care Planning/Counseling Complete Complete   Palliative Care Screening Not Applicable    Medication Review Oceanographer) Complete Complete

## 2024-07-11 ENCOUNTER — Non-Acute Institutional Stay (SKILLED_NURSING_FACILITY): Payer: Self-pay | Admitting: Nurse Practitioner

## 2024-07-11 ENCOUNTER — Encounter: Payer: Self-pay | Admitting: Nurse Practitioner

## 2024-07-11 DIAGNOSIS — D509 Iron deficiency anemia, unspecified: Secondary | ICD-10-CM

## 2024-07-11 DIAGNOSIS — K5901 Slow transit constipation: Secondary | ICD-10-CM

## 2024-07-11 DIAGNOSIS — M25551 Pain in right hip: Secondary | ICD-10-CM | POA: Insufficient documentation

## 2024-07-11 DIAGNOSIS — F0390 Unspecified dementia without behavioral disturbance: Secondary | ICD-10-CM

## 2024-07-11 DIAGNOSIS — I10 Essential (primary) hypertension: Secondary | ICD-10-CM

## 2024-07-11 DIAGNOSIS — E039 Hypothyroidism, unspecified: Secondary | ICD-10-CM

## 2024-07-11 DIAGNOSIS — E559 Vitamin D deficiency, unspecified: Secondary | ICD-10-CM | POA: Insufficient documentation

## 2024-07-11 DIAGNOSIS — N183 Chronic kidney disease, stage 3 unspecified: Secondary | ICD-10-CM

## 2024-07-11 DIAGNOSIS — I5020 Unspecified systolic (congestive) heart failure: Secondary | ICD-10-CM

## 2024-07-11 NOTE — Assessment & Plan Note (Signed)
 taking levothyroxine , TSH 0.477 07/06/24

## 2024-07-11 NOTE — Assessment & Plan Note (Signed)
 compensated clinically, taking furosemide  as needed  and hydrochlorothiazide daily Will update CBC/differential, CMP/eGFR, vitamin B12, BNP, lipid panel, vitamin D 

## 2024-07-11 NOTE — Assessment & Plan Note (Signed)
 taking donepezil  No behavioral issues, updated MMSE

## 2024-07-11 NOTE — Assessment & Plan Note (Signed)
 Hospitalized 07/04/2024 to 07/10/2024.  Post right hip fracture sustained from a mechanical fall, IM ORIF 07/05/2024, mild pain with movement, aspirin  81 mg twice daily for PE/DVT prophylaxis for 6 weeks, as needed oxycodone  available to her for pain

## 2024-07-11 NOTE — Assessment & Plan Note (Signed)
 Bun/creat 27/0.79 07/08/24

## 2024-07-11 NOTE — Assessment & Plan Note (Signed)
 last BM this morning, taking MiraLAX  as needed

## 2024-07-11 NOTE — Assessment & Plan Note (Signed)
 post op, Hgb 10/4 07/08/24

## 2024-07-11 NOTE — Assessment & Plan Note (Signed)
 taking vitamin D  supplement Updated vitamin D  level

## 2024-07-11 NOTE — Progress Notes (Unsigned)
 Location:   [facility] Room Number: 60A Place of Service:  SNF (31) Provider: [facility] Annita Ratliff NP  Okey Carlin Redbird, MD  Patient Care Team: Okey Carlin Redbird, MD as PCP - General (Family Medicine) Lavona Agent, MD as PCP - Cardiology (Cardiology)  Extended Emergency Contact Information Primary Emergency Contact: OKEY [place], [place] [address] [place] Home Phone: [phone] Relation: Daughter  Code Status: DNR Goals of care: Advanced Directive information    07/04/2024   11:15 PM  Advanced Directives  Does Patient Have a Medical Advance Directive? Yes  Type of Advance Directive Healthcare Power of Attorney  Does patient want to make changes to medical advance directive? No - Patient declined  Copy of Healthcare Power of Attorney in Chart? No - copy requested     Chief Complaint  Patient presents with  . Acute Visit    Medications reviewed following hospital stay    HPI:  Pt is a 83 y.o. female seen today for an acute visit for medication review following hospital stay  Hospitalized 07/04/2024 to 07/10/2024.  Post right hip fracture sustained from a mechanical fall, IM ORIF 07/05/2024, mild pain with movement, aspirin  81 mg twice daily for PE/DVT prophylaxis for 6 weeks, as needed oxycodone  available to her for pain  CHF, compensated clinically, taking furosemide  as needed  and hydrochlorothiazide daily  HTN, taking losartan   CKD, Bun/creat 27/0.79 07/08/24  Hypothyroidism, taking levothyroxine , TSH 0.477 07/06/24  Major neurocognitive disorder, taking donepezil   Anemia, post op, Hgb 10/4 07/08/24  Vit D deficiency, taking vitamin D  supplement  Constipation, last BM this morning, taking MiraLAX  as needed   Past Medical History:  Diagnosis Date  . Graves disease   . HTN (hypertension)   . Low back pain    Dr. Joshua  . Osteoporosis   . Pneumonia 2012  . Radiation 03/20/14, 03/27/14, 04/05/14, 04/12/14, 04/17/14   bracytherapy to  proximal vagina 30 gray  . Scoliosis   . Uterine cancer (HCC)    surgery and radiation x 5 treatments-last 9'15  . Vitamin D  deficiency    Past Surgical History:  Procedure Laterality Date  . BUNIONECTOMY     left  . CATARACT EXTRACTION, BILATERAL Bilateral    last done 11-21-14  . COLONOSCOPY WITH PROPOFOL  N/A 12/25/2014   Procedure: COLONOSCOPY WITH PROPOFOL ;  Surgeon: Gladis MARLA Louder, MD;  Location: [facility];  Service: Endoscopy;  Laterality: N/A;  . FOOT SURGERY Left 2004   Dr Eben  . INTRAMEDULLARY (IM) NAIL INTERTROCHANTERIC Right 07/05/2024   Procedure: FIXATION, FRACTURE, INTERTROCHANTERIC, WITH INTRAMEDULLARY ROD;  Surgeon: Teresa Rankin ORN, MD;  Location: [facility];  Service: Orthopedics;  Laterality: Right;  . JOINT REPLACEMENT     RTKA  . ROBOTIC ASSISTED TOTAL HYSTERECTOMY WITH BILATERAL SALPINGO OOPHERECTOMY  01/30/14   with lymph node biopsy  . TOTAL HIP ARTHROPLASTY Left 04/17/2022   Procedure: TOTAL HIP ARTHROPLASTY ANTERIOR APPROACH;  Surgeon: Fidel Rogue, MD;  Location: [facility];  Service: Orthopedics;  Laterality: Left;  . TOTAL KNEE ARTHROPLASTY     right  . TOTAL KNEE REVISION Right 01/04/2019   Procedure: TOTAL KNEE REVISION;  Surgeon: Melodi Lerner, MD;  Location: [facility];  Service: Orthopedics;  Laterality: Right;  with block    Allergies  Allergen Reactions  . Percocet [Oxycodone -Acetaminophen ] Nausea And Vomiting    Allergies as of 07/11/2024       Reactions   Percocet [oxycodone -acetaminophen ]  Nausea And Vomiting        Medication List        Accurate as of July 11, 2024 12:59 PM. If you have any questions, ask your nurse or doctor.          acetaminophen  500 MG tablet Commonly known as: TYLENOL  Take 2 tablets (1,000 mg total) by mouth every 8 (eight) hours.   aspirin  EC 81 MG tablet Take 1 tablet (81 mg total) by mouth in the morning and at bedtime. Swallow whole.   calcium  carbonate 1500 (600 Ca) MG Tabs  tablet Commonly known as: OSCAL Take 1,500 mg by mouth daily.   Citracal +D3 Tabs Take 1 tablet by mouth daily.   docusate sodium  100 MG capsule Commonly known as: COLACE Take 1 capsule (100 mg total) by mouth 2 (two) times daily.   donepezil  10 MG tablet Commonly known as: ARICEPT  Take 1 tablet (10 mg total) by mouth at bedtime.   furosemide  20 MG tablet Commonly known as: LASIX  Take 1 tablet (20 mg total) by mouth daily as needed for fluid or edema. What changed: when to take this   hydrochlorothiazide 12.5 MG tablet Commonly known as: HYDRODIURIL Take 12.5 mg by mouth every morning.   losartan  25 MG tablet Commonly known as: COZAAR  Take 25 mg by mouth daily.   magnesium  gluconate 500 (27 Mg) MG Tabs tablet Commonly known as: MAGONATE Take 500 mg by mouth daily.   oxyCODONE  5 MG immediate release tablet Commonly known as: Oxy IR/ROXICODONE  Take 1 tablet (5 mg total) by mouth every 6 (six) hours as needed for moderate pain (pain score 4-6).   polyethylene glycol 17 g packet Commonly known as: MIRALAX  / GLYCOLAX  Take 17 g by mouth daily as needed for mild constipation.   Synthroid  100 MCG tablet Generic drug: levothyroxine  1 tablet in the morning on an empty stomach Orally Once a day; Duration: 90 days   VITAMIN D3 PO Take 2,000 Int'l Units by mouth daily.        Review of Systems  Constitutional:  Negative for activity change, appetite change and fatigue.  HENT:  Negative for congestion and trouble swallowing.   Eyes:  Negative for visual disturbance.  Respiratory:  Negative for cough, shortness of breath and wheezing.   Cardiovascular:  Positive for leg swelling. Negative for chest pain and palpitations.  Gastrointestinal:  Negative for abdominal pain, constipation, nausea and vomiting.       Nausea x1 04/26/22  Genitourinary:  Negative for dysuria, frequency and urgency.  Musculoskeletal:  Positive for arthralgias, back pain, gait problem and joint  swelling.  Skin:  Negative for color change.  Neurological:  Negative for speech difficulty and headaches.       Memory lapses.   Psychiatric/Behavioral:  Negative for confusion and sleep disturbance. The patient is not nervous/anxious.     Immunization History  Administered Date(s) Administered  . Fluzone Influenza virus vaccine,trivalent (IIV3), split virus 05/17/2013, 06/19/2014  . INFLUENZA, HIGH DOSE SEASONAL PF 05/13/2017, 04/24/2019, 07/10/2024  . Influenza Split 05/25/2011, 05/30/2012  . Influenza,inj,quad, With Preservative 04/24/2019  . PFIZER(Purple Top)SARS-COV-2 Vaccination 09/07/2019, 09/28/2019  . Pfizer Covid-19 Vaccine Bivalent Booster 52yrs & up 05/11/2020  . Pneumococcal Conjugate-13 04/12/2014  . Pneumococcal Polysaccharide-23 08/27/2008  . Tdap 12/06/2008   Pertinent  Health Maintenance Due  Topic Date Due  . Influenza Vaccine  Completed  . Bone Density Scan  Completed  . Mammogram  Discontinued      04/21/2022  8:11 PM 04/22/2022    7:45 AM 04/22/2022   10:36 PM 04/23/2022    9:21 AM 04/24/2022    8:30 AM  Fall Risk  Falls in the past year?     1  (RETIRED) Patient Fall Risk Level High fall risk  High fall risk  High fall risk  High fall risk  High fall risk   Patient at Risk for Falls Due to     History of fall(s)  Fall risk Follow up     Falls evaluation completed      Data saved with a previous flowsheet row definition   Functional Status Survey:    Vitals:   07/11/24 1220  BP: (!) 156/60  Pulse: 74  Resp: 18  Temp: 98.2 F (36.8 C)  SpO2: 95%   There is no height or weight on file to calculate BMI. Physical Exam Vitals and nursing note reviewed.  Constitutional:      Appearance: Normal appearance. She is well-developed.  HENT:     Head: Normocephalic and atraumatic.     Nose: Nose normal.     Mouth/Throat:     Mouth: Mucous membranes are moist.  Eyes:     Extraocular Movements: Extraocular movements intact.     Conjunctiva/sclera:  Conjunctivae normal.     Pupils: Pupils are equal, round, and reactive to light.  Cardiovascular:     Rate and Rhythm: Normal rate and regular rhythm.     Heart sounds: No murmur heard. Pulmonary:     Effort: Pulmonary effort is normal.     Breath sounds: No wheezing or rales.  Abdominal:     General: Bowel sounds are normal.     Palpations: Abdomen is soft.     Tenderness: There is no abdominal tenderness. There is no right CVA tenderness, guarding or rebound.  Musculoskeletal:        General: Normal range of motion.     Cervical back: Normal range of motion and neck supple.     Right lower leg: Edema present.     Left lower leg: No edema.     Comments: BLE edema, R from hip to ankle 1-2+>L trace  Skin:    General: Skin is warm and dry.     Comments: Left hip surgical scar R hip surgical incision is covered in clean dressing, mild swelling in the area, no redness or warmth.   Neurological:     General: No focal deficit present.     Mental Status: She is alert and oriented to person, place, and time.     Motor: No weakness.     Gait: Gait abnormal.  Psychiatric:        Mood and Affect: Mood normal.        Behavior: Behavior normal.        Thought Content: Thought content normal.     Labs reviewed: Recent Labs    07/05/24 0335 07/06/24 0030 07/08/24 0914 07/08/24 2354  NA 143 142 141 139  K 3.7 4.1 3.7 3.9  CL 105 106 105 104  CO2 28 27 29 25   GLUCOSE 137* 161* 146* 130*  BUN 26* 22 35* 27*  CREATININE 1.12* 1.06* 1.01* 0.79  CALCIUM  9.3 8.7* 8.6* 8.5*  MG 2.1 1.9  --   --   PHOS 4.2 2.8  --   --    No results for input(s): AST, ALT, ALKPHOS, BILITOT, PROT, ALBUMIN in the last 8760 hours. Recent Labs    03/11/24 1957  07/04/24 2004 07/05/24 0335 07/06/24 0030 07/07/24 0343 07/08/24 0914 07/08/24 2354  WBC 13.6* 13.9* 12.5*   < > 13.1* 8.2 9.2  NEUTROABS 11.0* 12.1* 10.1*  --   --   --   --   HGB 15.0 13.5 11.6*   < > 8.9* 9.4* 10.4*  HCT 45.4  41.2 35.6*   < > 27.6* 29.0* 32.3*  MCV 93.4 92.4 92.0   < > 92.9 95.1 94.4  PLT 221 170 167   < > 136* 157 196   < > = values in this interval not displayed.   Lab Results  Component Value Date   TSH 0.477 07/06/2024   Lab Results  Component Value Date   HGBA1C 5.5 10/31/2014   Lab Results  Component Value Date   CHOL 147 10/31/2014   HDL 52 10/31/2014   LDLCALC 87 10/31/2014   TRIG 38 10/31/2014   CHOLHDL 2.8 10/31/2014    Significant Diagnostic Results in last 30 days:  CT HEAD WO CONTRAST ( ) Result Date: 07/07/2024 EXAM: CT HEAD WITHOUT CONTRAST 07/06/2024 11:58:00 PM TECHNIQUE: CT of the head was performed without the administration of intravenous contrast. Automated exposure control, iterative reconstruction, and/or weight based adjustment of the mA/kV was utilized to reduce the radiation dose to as low as reasonably achievable. COMPARISON: 03/11/2024 CLINICAL HISTORY: Memory loss; Mental status change, unknown cause FINDINGS: BRAIN AND VENTRICLES: No acute hemorrhage. No evidence of acute infarct. No hydrocephalus. No extra-axial collection. No mass effect or midline shift. Patchy and confluent decreased attenuation throughout deep and periventricular white matter of cerebral hemispheres bilaterally, compatible with chronic microvascular ischemic disease. Cerebral ventricle sizes concordant with cerebral volume loss. Atherosclerotic calcifications within cavernous internal carotid arteries. ORBITS: No acute abnormality. Bilateral lens replacement. SINUSES: No acute abnormality. SOFT TISSUES AND SKULL: No acute soft tissue abnormality. No skull fracture. IMPRESSION: 1. No acute intracranial abnormality. 2. 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 3. Cerebral volume loss with concordant ventricle sizes. Electronically signed by: Franky Stanford MD 07/07/2024 12:04 AM EST RP Workstation: [id number]   DG  HIP UNILAT WITH PELVIS 2-3 VIEWS RIGHT Result Date: 07/05/2024 CLINICAL DATA:  Elective surgery EXAM: DG HIP (WITH OR WITHOUT PELVIS) 2-3V*R* COMPARISON:  Right hip x-ray 07/04/2024 FINDINGS: Ten intraoperative fluoroscopic views of the right hip were submitted. Right hip screw and intramedullary nail were placed fixating in intratrochanteric fracture. Alignment is anatomic. Fluoroscopy time 1 minutes 14 seconds. Fluoroscopy dose 16.34 micro gray. IMPRESSION: Intraoperative fluoroscopic views of the right hip. Electronically Signed   By: Greig Pique M.D.   On: 07/05/2024 23:47   DG C-Arm 1-60 Min-No Report Result Date: 07/05/2024 Fluoroscopy was utilized by the requesting physician.  No radiographic interpretation.   DG C-Arm 1-60 Min-No Report Result Date: 07/05/2024 Fluoroscopy was utilized by the requesting physician.  No radiographic interpretation.   DG Knee 2 Views Right Result Date: 07/04/2024 CLINICAL DATA:  Fall, knee pain EXAM: RIGHT KNEE - 1-2 VIEW COMPARISON:  None Available. FINDINGS: Right knee total arthroplasty present. No evidence for hardware loosening or acute fracture. Joint spaces are maintained and alignment is anatomic. Soft tissues are within normal limits. IMPRESSION: Right knee total arthroplasty without evidence for hardware loosening or acute fracture. Electronically Signed   By: Greig Pique M.D.   On: 07/04/2024 19:50   DG Hip Unilat  With Pelvis 2-3 Views Right Result Date: 07/04/2024 CLINICAL DATA:  Pain after fall EXAM: DG HIP (  WITH OR WITHOUT PELVIS) 2-3V RIGHT COMPARISON:  None Available. FINDINGS: There is an acute right femoral intratrochanteric fracture which is nondisplaced. The lesser trochanter is a free fracture fragment displaced medially 1 cm. There is no dislocation. Left hip arthroplasty appears in anatomic alignment. There are degenerative changes of the lumbar spine. IMPRESSION: Acute right femoral intratrochanteric fracture. Electronically Signed   By:  Greig Pique M.D.   On: 07/04/2024 19:41    Assessment/Plan: Right hip pain Hospitalized 07/04/2024 to 07/10/2024.  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, taking furosemide  as needed  and hydrochlorothiazide daily Will update CBC/differential, CMP/eGFR, vitamin B12, BNP, lipid panel, vitamin D   Benign essential HTN Blood pressure is controlled,  taking losartan   CKD (chronic kidney disease) stage 3, GFR 30-59 ml/min (HCC) Bun/creat 27/0.79 07/08/24  Hypothyroidism  taking levothyroxine , TSH 0.477 07/06/24  Major neurocognitive disorder (HCC) taking donepezil  No behavioral issues, updated MMSE  Anemia post op, Hgb 10/4 07/08/24  Vitamin D  deficiency taking vitamin D  supplement Updated vitamin D  level  Slow transit constipation last BM this morning, taking MiraLAX  as needed    Family/ staff Communication: Plan of care reviewed with the patient and charge nurse  Labs/tests ordered:  CBC/differential, CMP/eGFR, vitamin B12, BNP, lipid panel, vitamin D 

## 2024-07-11 NOTE — Assessment & Plan Note (Signed)
 Fluctuating, 123/53 07/12/24 Continue Losartan , observe

## 2024-07-12 ENCOUNTER — Non-Acute Institutional Stay (SKILLED_NURSING_FACILITY): Admitting: Family Medicine

## 2024-07-12 DIAGNOSIS — F0390 Unspecified dementia without behavioral disturbance: Secondary | ICD-10-CM | POA: Diagnosis not present

## 2024-07-12 DIAGNOSIS — I10 Essential (primary) hypertension: Secondary | ICD-10-CM

## 2024-07-12 DIAGNOSIS — E039 Hypothyroidism, unspecified: Secondary | ICD-10-CM

## 2024-07-12 DIAGNOSIS — Z8781 Personal history of (healed) traumatic fracture: Secondary | ICD-10-CM

## 2024-07-12 NOTE — Assessment & Plan Note (Signed)
 Patient is alert and oriented.  Continue with donepezil 

## 2024-07-12 NOTE — Assessment & Plan Note (Signed)
 Patient currently undergoing physical therapy and will discharge back home as soon as deemed stable and safe

## 2024-07-12 NOTE — Assessment & Plan Note (Signed)
 Pressure controlled on combination losartan , furosemide , and hydrochlorothiazide

## 2024-07-12 NOTE — Progress Notes (Signed)
 Provider:  Garnette Pinal, MD Location:      Place of Service:     PCP: Okey Carlin Redbird, MD Patient Care Team: Okey Carlin Redbird, MD as PCP - General (Family Medicine) Lavona Agent, MD as PCP - Cardiology (Cardiology)  Extended Emergency Contact Information Primary Emergency Contact: OKEY [place], [place] [address] [place] Home Phone: [phone] Relation: Daughter  Code Status:  Goals of Care: Advanced Directive information    07/04/2024   11:15 PM  Advanced Directives  Does Patient Have a Medical Advance Directive? Yes  Type of Advance Directive Healthcare Power of Attorney  Does patient want to make changes to medical advance directive? No - Patient declined  Copy of Healthcare Power of Attorney in Chart? No - copy requested         HPI: Patient is a 83 y.o. female seen today for admission to [facility] skilled nursing facility for physical therapy after a mechanical fall with resultant right intertrochanteric hip fracture which was surgically repaired on 07/05/2024.  She had left hip fracture approximately 1 year ago and uses a cane and sometimes walker as an aid in ambulation.  She is followed by Dr. Sherrilyn Okey for diastolic congestive heart failure, hypertension, and hypothyroidism,.  There is also some cognitive issues as she is on donepezil .  Past Medical History:  Diagnosis Date   Graves disease    HTN (hypertension)    Low back pain    Dr. Joshua   Osteoporosis    Pneumonia 2012   Radiation 03/20/14, 03/27/14, 04/05/14, 04/12/14, 04/17/14   bracytherapy to proximal vagina 30 gray   Scoliosis    Uterine cancer (HCC)    surgery and radiation x 5 treatments-last 9'15   Vitamin D  deficiency    Past Surgical History:  Procedure Laterality Date   BUNIONECTOMY     left   CATARACT EXTRACTION, BILATERAL Bilateral    last done 11-21-14   COLONOSCOPY WITH PROPOFOL  N/A 12/25/2014   Procedure: COLONOSCOPY WITH PROPOFOL ;   Surgeon: Gladis MARLA Louder, MD;  Location: [facility];  Service: Endoscopy;  Laterality: N/A;   FOOT SURGERY Left 2004   Dr Eben   INTRAMEDULLARY (IM) NAIL INTERTROCHANTERIC Right 07/05/2024   Procedure: FIXATION, FRACTURE, INTERTROCHANTERIC, WITH INTRAMEDULLARY ROD;  Surgeon: Teresa Rankin ORN, MD;  Location: [facility];  Service: Orthopedics;  Laterality: Right;   JOINT REPLACEMENT     RTKA   ROBOTIC ASSISTED TOTAL HYSTERECTOMY WITH BILATERAL SALPINGO OOPHERECTOMY  01/30/14   with lymph node biopsy   TOTAL HIP ARTHROPLASTY Left 04/17/2022   Procedure: TOTAL HIP ARTHROPLASTY ANTERIOR APPROACH;  Surgeon: Fidel Rogue, MD;  Location: [facility];  Service: Orthopedics;  Laterality: Left;   TOTAL KNEE ARTHROPLASTY     right   TOTAL KNEE REVISION Right 01/04/2019   Procedure: TOTAL KNEE REVISION;  Surgeon: Melodi Lerner, MD;  Location: [facility];  Service: Orthopedics;  Laterality: Right;  with block    reports that she has never smoked. She has never used smokeless tobacco. She reports that she does not drink alcohol  and does not use drugs. Social History   Socioeconomic History   Marital status: Married    Spouse name: Not on file   Number of children: 3   Years of education: Not on file   Highest education level: High school graduate  Occupational History   Occupation: administrative assitant  Tobacco Use   Smoking status: Never  Smokeless tobacco: Never  Vaping Use   Vaping status: Never Used  Substance and Sexual Activity   Alcohol  use: Never   Drug use: Never   Sexual activity: Not Currently  Other Topics Concern   Not on file  Social History Narrative   Lives at home with husband, Zell.  Four grand and five great grands.     Caffeine: diet coke about 1-2 per day    Right handed   Social Drivers of Health   Financial Resource Strain: Not on file  Food Insecurity: No Food Insecurity (07/04/2024)   Hunger Vital Sign    Worried About Running Out of Food in the Last Year:  Never true    Ran Out of Food in the Last Year: Never true  Transportation Needs: No Transportation Needs (07/04/2024)   PRAPARE - Administrator, Civil Service (Medical): No    Lack of Transportation (Non-Medical): No  Physical Activity: Not on file  Stress: Not on file  Social Connections: Moderately Integrated (07/04/2024)   Social Connection and Isolation Panel    Frequency of Communication with Friends and Family: Once a week    Frequency of Social Gatherings with Friends and Family: More than three times a week    Attends Religious Services: More than 4 times per year    Active Member of [employer] or Organizations: No    Attends Banker Meetings: Never    Marital Status: Married  Catering Manager Violence: Not At Risk (07/04/2024)   Humiliation, Afraid, Rape, and Kick questionnaire    Fear of Current or Ex-Partner: No    Emotionally Abused: No    Physically Abused: No    Sexually Abused: No    Functional Status Survey:    Family History  Adopted: Yes  Problem Relation Age of Onset   Heart disease Mother 13       No details   Memory loss Neg Hx    Dementia Neg Hx     Health Maintenance  Topic Date Due   Zoster Vaccines- Shingrix (1 of 2) 07/29/1960   DTaP/Tdap/Td (2 - Td or Tdap) 12/07/2018   COVID-19 Vaccine (4 - 2025-26 season) 04/03/2024   Medicare Annual Wellness (AWV)  02/28/2025   Pneumococcal Vaccine: 50+ Years  Completed   Influenza Vaccine  Completed   Bone Density Scan  Completed   Meningococcal B Vaccine  Aged Out   Mammogram  Discontinued    Allergies  Allergen Reactions   Percocet [Oxycodone -Acetaminophen ] Nausea And Vomiting    Outpatient Encounter Medications as of 07/12/2024  Medication Sig   acetaminophen  (TYLENOL ) 500 MG tablet Take 2 tablets (1,000 mg total) by mouth every 8 (eight) hours.   aspirin  EC 81 MG tablet Take 1 tablet (81 mg total) by mouth in the morning and at bedtime. Swallow whole.   calcium  carbonate  (OSCAL) 1500 (600 Ca) MG TABS tablet Take 1,500 mg by mouth daily.   Cholecalciferol  (VITAMIN D3 PO) Take 2,000 Int'l Units by mouth daily.   docusate sodium  (COLACE) 100 MG capsule Take 1 capsule (100 mg total) by mouth 2 (two) times daily.   donepezil  (ARICEPT ) 10 MG tablet Take 1 tablet (10 mg total) by mouth at bedtime.   furosemide  (LASIX ) 20 MG tablet Take 1 tablet (20 mg total) by mouth daily as needed for fluid or edema. (Patient taking differently: Take 20 mg by mouth daily.)   hydrochlorothiazide (HYDRODIURIL) 12.5 MG tablet Take 12.5 mg by mouth every morning.  losartan  (COZAAR ) 25 MG tablet Take 25 mg by mouth daily.   magnesium  gluconate (MAGONATE) 500 (27 Mg) MG TABS tablet Take 500 mg by mouth daily.   Multiple Vitamins-Minerals (CITRACAL +D3) TABS Take 1 tablet by mouth daily.   oxyCODONE  (OXY IR/ROXICODONE ) 5 MG immediate release tablet Take 1 tablet (5 mg total) by mouth every 6 (six) hours as needed for moderate pain (pain score 4-6).   polyethylene glycol (MIRALAX  / GLYCOLAX ) 17 g packet Take 17 g by mouth daily as needed for mild constipation.   SYNTHROID  100 MCG tablet 1 tablet in the morning on an empty stomach Orally Once a day; Duration: 90 days   No facility-administered encounter medications on file as of 07/12/2024.    Review of Systems  Constitutional:  Positive for activity change.  HENT: Negative.    Respiratory: Negative.    Cardiovascular: Negative.   Gastrointestinal: Negative.   Genitourinary: Negative.   Musculoskeletal:  Positive for arthralgias and gait problem.  Skin: Negative.   Psychiatric/Behavioral: Negative.    All other systems reviewed and are negative.   There were no vitals filed for this visit. There is no height or weight on file to calculate BMI. Physical Exam Vitals and nursing note reviewed.  Constitutional:      Appearance: She is obese.  HENT:     Head: Normocephalic.     Nose: Nose normal.     Mouth/Throat:     Mouth:  Mucous membranes are moist.     Pharynx: Oropharynx is clear.  Cardiovascular:     Rate and Rhythm: Normal rate and regular rhythm.     Pulses: Normal pulses.  Pulmonary:     Effort: Pulmonary effort is normal.     Breath sounds: Normal breath sounds.  Abdominal:     General: Bowel sounds are normal.     Palpations: Abdomen is soft.  Musculoskeletal:     Cervical back: Normal range of motion.     Comments: There is tenderness around right hip which was surgically repaired 1 week ago.  Skin:    General: Skin is warm and dry.  Neurological:     General: No focal deficit present.     Mental Status: She is alert and oriented to person, place, and time.  Psychiatric:        Mood and Affect: Mood normal.        Behavior: Behavior normal.     Labs reviewed: Basic Metabolic Panel: Recent Labs    07/05/24 0335 07/06/24 0030 07/08/24 0914 07/08/24 2354  NA 143 142 141 139  K 3.7 4.1 3.7 3.9  CL 105 106 105 104  CO2 28 27 29 25   GLUCOSE 137* 161* 146* 130*  BUN 26* 22 35* 27*  CREATININE 1.12* 1.06* 1.01* 0.79  CALCIUM  9.3 8.7* 8.6* 8.5*  MG 2.1 1.9  --   --   PHOS 4.2 2.8  --   --    Liver Function Tests: No results for input(s): AST, ALT, ALKPHOS, BILITOT, PROT, ALBUMIN in the last 8760 hours. No results for input(s): LIPASE, AMYLASE in the last 8760 hours. Recent Labs    07/06/24 1906  AMMONIA 17   CBC: Recent Labs    03/11/24 1957 07/04/24 2004 07/05/24 0335 07/06/24 0030 07/07/24 0343 07/08/24 0914 07/08/24 2354  WBC 13.6* 13.9* 12.5*   < > 13.1* 8.2 9.2  NEUTROABS 11.0* 12.1* 10.1*  --   --   --   --   HGB 15.0  13.5 11.6*   < > 8.9* 9.4* 10.4*  HCT 45.4 41.2 35.6*   < > 27.6* 29.0* 32.3*  MCV 93.4 92.4 92.0   < > 92.9 95.1 94.4  PLT 221 170 167   < > 136* 157 196   < > = values in this interval not displayed.   Cardiac Enzymes: No results for input(s): CKTOTAL, CKMB, CKMBINDEX, TROPONINI in the last 8760 hours. BNP: Invalid  input(s): POCBNP Lab Results  Component Value Date   HGBA1C 5.5 10/31/2014   Lab Results  Component Value Date   TSH 0.477 07/06/2024   No results found for: VITAMINB12 No results found for: FOLATE No results found for: IRON, TIBC, FERRITIN  Imaging and Procedures obtained prior to SNF admission: DG HIP UNILAT WITH PELVIS 2-3 VIEWS RIGHT Result Date: 07/05/2024 CLINICAL DATA:  Elective surgery EXAM: DG HIP (WITH OR WITHOUT PELVIS) 2-3V*R* COMPARISON:  Right hip x-ray 07/04/2024 FINDINGS: Ten intraoperative fluoroscopic views of the right hip were submitted. Right hip screw and intramedullary nail were placed fixating in intratrochanteric fracture. Alignment is anatomic. Fluoroscopy time 1 minutes 14 seconds. Fluoroscopy dose 16.34 micro gray. IMPRESSION: Intraoperative fluoroscopic views of the right hip. Electronically Signed   By: Greig Pique M.D.   On: 07/05/2024 23:47   DG C-Arm 1-60 Min-No Report Result Date: 07/05/2024 Fluoroscopy was utilized by the requesting physician.  No radiographic interpretation.   DG C-Arm 1-60 Min-No Report Result Date: 07/05/2024 Fluoroscopy was utilized by the requesting physician.  No radiographic interpretation.   DG Knee 2 Views Right Result Date: 07/04/2024 CLINICAL DATA:  Fall, knee pain EXAM: RIGHT KNEE - 1-2 VIEW COMPARISON:  None Available. FINDINGS: Right knee total arthroplasty present. No evidence for hardware loosening or acute fracture. Joint spaces are maintained and alignment is anatomic. Soft tissues are within normal limits. IMPRESSION: Right knee total arthroplasty without evidence for hardware loosening or acute fracture. Electronically Signed   By: Greig Pique M.D.   On: 07/04/2024 19:50   DG Hip Unilat  With Pelvis 2-3 Views Right Result Date: 07/04/2024 CLINICAL DATA:  Pain after fall EXAM: DG HIP (WITH OR WITHOUT PELVIS) 2-3V RIGHT COMPARISON:  None Available. FINDINGS: There is an acute right femoral  intratrochanteric fracture which is nondisplaced. The lesser trochanter is a free fracture fragment displaced medially 1 cm. There is no dislocation. Left hip arthroplasty appears in anatomic alignment. There are degenerative changes of the lumbar spine. IMPRESSION: Acute right femoral intratrochanteric fracture. Electronically Signed   By: Greig Pique M.D.   On: 07/04/2024 19:41    Assessment/Plan Hypothyroidism Takes Synthroid  100 mcg daily  Benign essential HTN Pressure controlled on combination losartan , furosemide , and hydrochlorothiazide  Major neurocognitive disorder [facility]) Patient is alert and oriented.  Continue with donepezil   S/P right hip fracture Patient currently undergoing physical therapy and will discharge back home as soon as deemed stable and safe    Family/ staff Communication:   Labs/tests ordered:  Garnette HERO. Cleotilde, MD [facility] [address] [place], [place] [address] Office [phone]

## 2024-07-12 NOTE — Assessment & Plan Note (Signed)
 Takes Synthroid  100 mcg daily

## 2024-07-13 ENCOUNTER — Telehealth: Payer: Self-pay | Admitting: Cardiology

## 2024-07-13 LAB — BASIC METABOLIC PANEL WITH GFR
BUN: 35 — AB (ref 4–21)
Chloride: 106 (ref 99–108)
Glucose: 98
Potassium: 4 meq/L (ref 3.5–5.1)
Sodium: 140 (ref 137–147)

## 2024-07-13 LAB — LIPID PANEL
Cholesterol: 130 (ref 0–200)
HDL: 37 (ref 35–70)
LDL Cholesterol: 75
Triglycerides: 98 (ref 40–160)

## 2024-07-13 LAB — COMPREHENSIVE METABOLIC PANEL WITH GFR: eGFR: 67

## 2024-07-13 NOTE — Telephone Encounter (Signed)
 [facility] called to see if this pt is still on Fluid restrictions please advise

## 2024-07-14 NOTE — Telephone Encounter (Signed)
 Fluid intake should be around 60-80 ounces a day. Glendia Ferrier, PA-C    07/14/2024 2:07 PM

## 2024-07-18 NOTE — Telephone Encounter (Signed)
 Spoke with Deja at [facility]. Per Dr. Lavona: I would say 48 oz since she has previously had some SOB and I have suspected some diastolic dysfunction in the past.  Verbalized understanding and would update Dr at facility.

## 2024-08-04 ENCOUNTER — Encounter: Payer: Self-pay | Admitting: Nurse Practitioner

## 2024-08-04 ENCOUNTER — Non-Acute Institutional Stay (SKILLED_NURSING_FACILITY): Payer: Self-pay | Admitting: Nurse Practitioner

## 2024-08-04 DIAGNOSIS — M25552 Pain in left hip: Secondary | ICD-10-CM | POA: Diagnosis not present

## 2024-08-04 DIAGNOSIS — F0390 Unspecified dementia without behavioral disturbance: Secondary | ICD-10-CM

## 2024-08-04 DIAGNOSIS — D509 Iron deficiency anemia, unspecified: Secondary | ICD-10-CM | POA: Diagnosis not present

## 2024-08-04 DIAGNOSIS — E559 Vitamin D deficiency, unspecified: Secondary | ICD-10-CM

## 2024-08-04 DIAGNOSIS — N183 Chronic kidney disease, stage 3 unspecified: Secondary | ICD-10-CM

## 2024-08-04 DIAGNOSIS — K5901 Slow transit constipation: Secondary | ICD-10-CM

## 2024-08-04 DIAGNOSIS — E785 Hyperlipidemia, unspecified: Secondary | ICD-10-CM | POA: Diagnosis not present

## 2024-08-04 DIAGNOSIS — E039 Hypothyroidism, unspecified: Secondary | ICD-10-CM

## 2024-08-04 DIAGNOSIS — I5020 Unspecified systolic (congestive) heart failure: Secondary | ICD-10-CM

## 2024-08-04 DIAGNOSIS — I10 Essential (primary) hypertension: Secondary | ICD-10-CM

## 2024-08-04 NOTE — Assessment & Plan Note (Signed)
 Healing nicely from IM ORIF 07/05/24 Working with therapy, WBAT

## 2024-08-04 NOTE — Assessment & Plan Note (Signed)
 taking vitamin D  supplement, Vit D 61 07/13/24

## 2024-08-04 NOTE — Assessment & Plan Note (Signed)
Blood pressure is controlled, continue Losartan

## 2024-08-04 NOTE — Assessment & Plan Note (Signed)
 taking donepezil , no behavioral issues.  07/12/24 MMSE 13/30

## 2024-08-04 NOTE — Assessment & Plan Note (Signed)
 Hgb 9.9 07/13/24

## 2024-08-04 NOTE — Assessment & Plan Note (Signed)
 LDL 75 07/13/24

## 2024-08-04 NOTE — Assessment & Plan Note (Signed)
"   stable,  taking MiraLAX  as needed "

## 2024-08-04 NOTE — Assessment & Plan Note (Signed)
 taking levothyroxine , TSH 0.477 07/06/24

## 2024-08-04 NOTE — Assessment & Plan Note (Signed)
 Bun/creat 35/0.86 07/13/24

## 2024-08-04 NOTE — Progress Notes (Signed)
 " Location:  [facility] Room Number: N060-A Place of Service:  SNF [phone]) Provider: Darol Cush Lorenda Concha Sudol N.P.  Patient Care Team: Okey Carlin Redbird, MD as PCP - General (Family Medicine) Lavona Agent, MD as PCP - Cardiology (Cardiology)  Extended Emergency Contact Information Primary Emergency Contact: OKEY [place], [place] [address] [place] Home Phone: [phone] Relation: Daughter  Code Status:  DNR Goals of care: Advanced Directive information    07/04/2024   11:15 PM  Advanced Directives  Does Patient Have a Medical Advance Directive? Yes  Type of Advance Directive Healthcare Power of Attorney  Does patient want to make changes to medical advance directive? No - Patient declined  Copy of Healthcare Power of Attorney in Chart? No - copy requested     Chief Complaint  Patient presents with   medication mangement of chronic issues    Routine visit. Discuss the need for shingles, Dtap, and Covid vaccine.    HPI:  Pt is a 84 y.o. female seen today for medical management of chronic diseases.    Hospitalized 07/04/2024 to 07/10/2024.  Post right hip fracture sustained from a mechanical fall, IM ORIF 07/05/2024, mild pain with movement, aspirin  81 mg twice daily for PE/DVT prophylaxis for 6 weeks, as needed oxycodone  available to her for pain             CHF, compensated clinically, taking furosemide  as needed  and hydrochlorothiazide daily, BNP 114 07/13/24             HTN, taking losartan              CKD, Bun/creat 35/0.86 07/13/24             Hypothyroidism, taking levothyroxine , TSH 0.477 07/06/24             Major neurocognitive disorder, taking donepezil , no behavioral issues. MMSE 13/30 07/12/24             Anemia, post op, Hgb 9.9 07/13/24             Vit D deficiency, taking vitamin D  supplement, Vit D 61 07/13/24             Constipation, stable,  taking MiraLAX  as needed  HLD, LDL 75 07/13/24   Past Medical History:   Diagnosis Date   Graves disease    HTN (hypertension)    Low back pain    Dr. Joshua   Osteoporosis    Pneumonia 2012   Radiation 03/20/14, 03/27/14, 04/05/14, 04/12/14, 04/17/14   bracytherapy to proximal vagina 30 gray   Scoliosis    Uterine cancer (HCC)    surgery and radiation x 5 treatments-last 9'15   Vitamin D  deficiency    Past Surgical History:  Procedure Laterality Date   BUNIONECTOMY     left   CATARACT EXTRACTION, BILATERAL Bilateral    last done 11-21-14   COLONOSCOPY WITH PROPOFOL  N/A 12/25/2014   Procedure: COLONOSCOPY WITH PROPOFOL ;  Surgeon: Gladis MARLA Louder, MD;  Location: [facility];  Service: Endoscopy;  Laterality: N/A;   FOOT SURGERY Left 2004   Dr Eben   INTRAMEDULLARY (IM) NAIL INTERTROCHANTERIC Right 07/05/2024   Procedure: FIXATION, FRACTURE, INTERTROCHANTERIC, WITH INTRAMEDULLARY ROD;  Surgeon: Teresa Rankin ORN, MD;  Location: [facility];  Service: Orthopedics;  Laterality: Right;   JOINT REPLACEMENT     RTKA   ROBOTIC ASSISTED TOTAL HYSTERECTOMY WITH BILATERAL SALPINGO OOPHERECTOMY  01/30/14  with lymph node biopsy   TOTAL HIP ARTHROPLASTY Left 04/17/2022   Procedure: TOTAL HIP ARTHROPLASTY ANTERIOR APPROACH;  Surgeon: Fidel Rogue, MD;  Location: [facility];  Service: Orthopedics;  Laterality: Left;   TOTAL KNEE ARTHROPLASTY     right   TOTAL KNEE REVISION Right 01/04/2019   Procedure: TOTAL KNEE REVISION;  Surgeon: Melodi Lerner, MD;  Location: [facility];  Service: Orthopedics;  Laterality: Right;  with block    Allergies[1]  Outpatient Encounter Medications as of 08/04/2024  Medication Sig   acetaminophen  (TYLENOL ) 500 MG tablet Take 2 tablets (1,000 mg total) by mouth every 8 (eight) hours.   aspirin  EC 81 MG tablet Take 1 tablet (81 mg total) by mouth in the morning and at bedtime. Swallow whole.   calcium  carbonate (OSCAL) 1500 (600 Ca) MG TABS tablet Take 1,500 mg by mouth daily.   Cholecalciferol  (VITAMIN D3 PO) Take 2,000 Int'l Units by mouth  daily.   donepezil  (ARICEPT ) 10 MG tablet Take 1 tablet (10 mg total) by mouth at bedtime.   furosemide  (LASIX ) 20 MG tablet Take 1 tablet (20 mg total) by mouth daily as needed for fluid or edema.   hydrochlorothiazide (HYDRODIURIL) 12.5 MG tablet Take 12.5 mg by mouth every morning.   losartan  (COZAAR ) 25 MG tablet Take 25 mg by mouth daily.   magnesium  gluconate (MAGONATE) 500 (27 Mg) MG TABS tablet Take 500 mg by mouth daily.   polyethylene glycol (MIRALAX  / GLYCOLAX ) 17 g packet Take 17 g by mouth daily as needed for mild constipation.   SYNTHROID  100 MCG tablet 1 tablet in the morning on an empty stomach Orally Once a day; Duration: 90 days   docusate sodium  (COLACE) 100 MG capsule Take 1 capsule (100 mg total) by mouth 2 (two) times daily. (Patient not taking: Reported on 08/04/2024)   Multiple Vitamins-Minerals (CITRACAL +D3) TABS Take 1 tablet by mouth daily. (Patient not taking: Reported on 08/04/2024)   oxyCODONE  (OXY IR/ROXICODONE ) 5 MG immediate release tablet Take 1 tablet (5 mg total) by mouth every 6 (six) hours as needed for moderate pain (pain score 4-6). (Patient not taking: Reported on 08/04/2024)   No facility-administered encounter medications on file as of 08/04/2024.    Review of Systems  Constitutional:  Negative for activity change, appetite change and fatigue.  HENT:  Negative for congestion and trouble swallowing.   Eyes:  Negative for visual disturbance.  Respiratory:  Negative for cough, shortness of breath and wheezing.   Cardiovascular:  Positive for leg swelling. Negative for chest pain and palpitations.  Gastrointestinal:  Negative for abdominal pain and constipation.  Genitourinary:  Negative for dysuria, frequency and urgency.  Musculoskeletal:  Positive for arthralgias, back pain, gait problem and joint swelling.  Skin:  Negative for color change.  Neurological:  Negative for speech difficulty and headaches.       Memory lapses.   Psychiatric/Behavioral:   Negative for confusion and sleep disturbance. The patient is not nervous/anxious.     Immunization History  Administered Date(s) Administered   Fluzone  Influenza virus vaccine,trivalent (IIV3), split virus 05/17/2013, 06/19/2014   INFLUENZA, HIGH DOSE SEASONAL PF 05/13/2017, 04/24/2019, 07/10/2024   Influenza Split 05/25/2011, 05/30/2012   Influenza,inj,quad, With Preservative 04/24/2019   PFIZER(Purple Top)SARS-COV-2 Vaccination 09/07/2019, 09/28/2019   Pfizer Covid-19 Vaccine Bivalent Booster 29yrs & up 05/11/2020   Pneumococcal Conjugate-13 04/12/2014   Pneumococcal Polysaccharide-23 08/27/2008   Tdap 12/06/2008   Pertinent  Health Maintenance Due  Topic Date Due   Influenza  Vaccine  Completed   Bone Density Scan  Completed   Mammogram  Discontinued      04/21/2022    8:11 PM 04/22/2022    7:45 AM 04/22/2022   10:36 PM 04/23/2022    9:21 AM 04/24/2022    8:30 AM  Fall Risk  Falls in the past year?     1  (RETIRED) Patient Fall Risk Level High fall risk  High fall risk  High fall risk  High fall risk  High fall risk   Patient at Risk for Falls Due to     History of fall(s)  Fall risk Follow up     Falls evaluation completed      Data saved with a previous flowsheet row definition   Functional Status Survey:    Vitals:   08/04/24 1105  BP: 131/62  Pulse: 72  SpO2: 94%  Weight: 183 lb 12.8 oz (83.4 kg)  Height: 5' 0.5 (1.537 m)   Body mass index is 35.31 kg/m. Physical Exam Vitals and nursing note reviewed.  Constitutional:      Appearance: Normal appearance. She is well-developed.  HENT:     Head: Normocephalic and atraumatic.     Nose: Nose normal.     Mouth/Throat:     Mouth: Mucous membranes are moist.  Eyes:     Extraocular Movements: Extraocular movements intact.     Conjunctiva/sclera: Conjunctivae normal.     Pupils: Pupils are equal, round, and reactive to light.  Cardiovascular:     Rate and Rhythm: Normal rate and regular rhythm.     Heart  sounds: No murmur heard. Pulmonary:     Effort: Pulmonary effort is normal.     Breath sounds: No wheezing or rales.  Abdominal:     General: Bowel sounds are normal.     Palpations: Abdomen is soft.     Tenderness: There is no abdominal tenderness. There is no right CVA tenderness, guarding or rebound.  Musculoskeletal:        General: Normal range of motion.     Cervical back: Normal range of motion and neck supple.     Right lower leg: Edema present.     Left lower leg: Edema present.     Comments: BLE edema, R>L  Skin:    General: Skin is warm and dry.     Comments: Left hip surgical scar R hip surgical scar  Neurological:     General: No focal deficit present.     Mental Status: She is alert and oriented to person, place, and time.     Motor: No weakness.     Gait: Gait abnormal.  Psychiatric:        Mood and Affect: Mood normal.        Behavior: Behavior normal.        Thought Content: Thought content normal.     Labs reviewed: Recent Labs    07/05/24 0335 07/06/24 0030 07/08/24 0914 07/08/24 2354 07/13/24 0000  NA 143 142 141 139 140  K 3.7 4.1 3.7 3.9 4.0  CL 105 106 105 104 106  CO2 28 27 29 25   --   GLUCOSE 137* 161* 146* 130*  --   BUN 26* 22 35* 27* 35*  CREATININE 1.12* 1.06* 1.01* 0.79  --   CALCIUM  9.3 8.7* 8.6* 8.5*  --   MG 2.1 1.9  --   --   --   PHOS 4.2 2.8  --   --   --  No results for input(s): AST, ALT, ALKPHOS, BILITOT, PROT, ALBUMIN in the last 8760 hours. Recent Labs    03/11/24 1957 07/04/24 2004 07/05/24 0335 07/06/24 0030 07/07/24 0343 07/08/24 0914 07/08/24 2354  WBC 13.6* 13.9* 12.5*   < > 13.1* 8.2 9.2  NEUTROABS 11.0* 12.1* 10.1*  --   --   --   --   HGB 15.0 13.5 11.6*   < > 8.9* 9.4* 10.4*  HCT 45.4 41.2 35.6*   < > 27.6* 29.0* 32.3*  MCV 93.4 92.4 92.0   < > 92.9 95.1 94.4  PLT 221 170 167   < > 136* 157 196   < > = values in this interval not displayed.   Lab Results  Component Value Date   TSH  0.477 07/06/2024   Lab Results  Component Value Date   HGBA1C 5.5 10/31/2014   Lab Results  Component Value Date   CHOL 130 07/13/2024   HDL 37 07/13/2024   LDLCALC 75 07/13/2024   TRIG 98 07/13/2024   CHOLHDL 2.8 10/31/2014    Significant Diagnostic Results in last 30 days:  No results found.   Assessment/Plan Benign essential HTN Blood pressure is controlled, continue Losartan   CKD (chronic kidney disease) stage 3, GFR 30-59 ml/min (HCC) Bun/creat 35/0.86 07/13/24  Hypothyroidism taking levothyroxine , TSH 0.477 07/06/24  Major neurocognitive disorder (HCC) taking donepezil , no behavioral issues.  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   Labs/tests ordered:  none         [1]  Allergies Allergen Reactions   Percocet [Oxycodone -Acetaminophen ] Nausea And Vomiting   "

## 2024-08-04 NOTE — Assessment & Plan Note (Signed)
 compensated clinically, taking furosemide  as needed  and hydrochlorothiazide daily, BNP 114 07/13/24

## 2024-08-07 ENCOUNTER — Encounter: Payer: Self-pay | Admitting: Nurse Practitioner

## 2024-08-29 ENCOUNTER — Non-Acute Institutional Stay (SKILLED_NURSING_FACILITY): Payer: Self-pay | Admitting: Nurse Practitioner

## 2024-08-29 ENCOUNTER — Encounter: Payer: Self-pay | Admitting: Nurse Practitioner

## 2024-08-29 DIAGNOSIS — E039 Hypothyroidism, unspecified: Secondary | ICD-10-CM

## 2024-08-29 DIAGNOSIS — I10 Essential (primary) hypertension: Secondary | ICD-10-CM

## 2024-08-29 DIAGNOSIS — M25552 Pain in left hip: Secondary | ICD-10-CM

## 2024-08-29 DIAGNOSIS — E559 Vitamin D deficiency, unspecified: Secondary | ICD-10-CM

## 2024-08-29 DIAGNOSIS — F0390 Unspecified dementia without behavioral disturbance: Secondary | ICD-10-CM | POA: Diagnosis not present

## 2024-08-29 DIAGNOSIS — D509 Iron deficiency anemia, unspecified: Secondary | ICD-10-CM

## 2024-08-29 DIAGNOSIS — E785 Hyperlipidemia, unspecified: Secondary | ICD-10-CM

## 2024-08-29 DIAGNOSIS — I5020 Unspecified systolic (congestive) heart failure: Secondary | ICD-10-CM

## 2024-08-29 DIAGNOSIS — K5901 Slow transit constipation: Secondary | ICD-10-CM | POA: Diagnosis not present

## 2024-08-29 MED ORDER — SYNTHROID 100 MCG PO TABS: 100.0000 ug | ORAL_TABLET | Freq: Every day | ORAL | 2 refills | Status: AC

## 2024-08-29 MED ORDER — HYDROCHLOROTHIAZIDE 12.5 MG PO TABS: 12.5000 mg | ORAL_TABLET | Freq: Every morning | ORAL | 2 refills | Status: AC

## 2024-08-29 MED ORDER — FUROSEMIDE 20 MG PO TABS: 20.0000 mg | ORAL_TABLET | Freq: Every day | ORAL | 3 refills | Status: AC | PRN

## 2024-08-29 MED ORDER — LOSARTAN POTASSIUM 25 MG PO TABS: 25.0000 mg | ORAL_TABLET | Freq: Every day | ORAL | 2 refills | Status: AC

## 2024-08-29 MED ORDER — DONEPEZIL HCL 10 MG PO TABS: 10.0000 mg | ORAL_TABLET | Freq: Every day | ORAL | 4 refills | Status: AC

## 2024-08-29 NOTE — Assessment & Plan Note (Signed)
Blood pressure is controlled, continue Losartan

## 2024-08-29 NOTE — Assessment & Plan Note (Signed)
 taking vitamin D  supplement, Vit D 61 07/13/24

## 2024-08-29 NOTE — Progress Notes (Unsigned)
 " Location:  SNF [facility] Room Number: N060-A Place of Service:  SNF (31)  Provider: Larwance Hark NP  PCP: Okey Carlin Redbird, MD Patient Care Team: Okey Carlin Redbird, MD as PCP - General (Family Medicine) Lavona Agent, MD as PCP - Cardiology (Cardiology)  Extended Emergency Contact Information Primary Emergency Contact: OKEY [place]ISON [place], [place] [address] [place] Home Phone: [phone] Relation: Daughter  Code Status: DNR Goals of care:  Advanced Directive information    08/29/2024   12:34 PM  Advanced Directives  Does Patient Have a Medical Advance Directive? No  Would patient like information on creating a medical advance directive? No - Patient declined     Allergies[1]  Chief Complaint  Patient presents with   Transitions Of Care    Patient is being discharge from SNF    HPI:  84 y.o. female with medical history significant for HTN, CHF, Hypothyroidism, dementia, post op anemia was admitted to [facility] for therapy following hospital stay.     Hospitalized 07/04/2024 to 07/10/2024.  Post right hip fracture sustained from a mechanical fall, IM ORIF 07/05/2024, aspirin  81 mg twice daily for PE/DVT prophylaxis for 6 weeks, as needed oxycodone  available to her for pain  The patient has recovered from R hip surgery, ambulates with walker, regained her physical strength and her baseline ADL function. She is stable to discharge home with her husband and personal care assistant to continue therapy at home.              CHF, compensated clinically, taking furosemide  as needed  and hydrochlorothiazide  daily, BNP 114 07/13/24             HTN, taking losartan              CKD, Bun/creat 35/0.86 07/13/24             Hypothyroidism, taking levothyroxine , TSH 0.477 07/06/24             Major neurocognitive disorder, taking donepezil , no behavioral issues. MMSE 13/30 07/12/24             Anemia, post op, Hgb 9.9 07/13/24             Vit D deficiency,  taking vitamin D  supplement, Vit D 61 07/13/24             Constipation, stable,  taking MiraLAX  as needed             HLD, LDL 75 07/13/24        Past Medical History:  Diagnosis Date   Graves disease    HTN (hypertension)    Low back pain    Dr. Joshua   Osteoporosis    Pneumonia 2012   Radiation 03/20/14, 03/27/14, 04/05/14, 04/12/14, 04/17/14   bracytherapy to proximal vagina 30 gray   Scoliosis    Uterine cancer (HCC)    surgery and radiation x 5 treatments-last 9'15   Vitamin D  deficiency     Past Surgical History:  Procedure Laterality Date   BUNIONECTOMY     left   CATARACT EXTRACTION, BILATERAL Bilateral    last done 11-21-14   COLONOSCOPY WITH PROPOFOL  N/A 12/25/2014   Procedure: COLONOSCOPY WITH PROPOFOL ;  Surgeon: Gladis MARLA Louder, MD;  Location: [facility];  Service: Endoscopy;  Laterality: N/A;   FOOT SURGERY Left 2004   Dr Eben   INTRAMEDULLARY (IM) NAIL INTERTROCHANTERIC Right 07/05/2024   Procedure: FIXATION, FRACTURE, INTERTROCHANTERIC,  WITH INTRAMEDULLARY ROD;  Surgeon: Teresa Rankin ORN, MD;  Location: [facility];  Service: Orthopedics;  Laterality: Right;   JOINT REPLACEMENT     RTKA   ROBOTIC ASSISTED TOTAL HYSTERECTOMY WITH BILATERAL SALPINGO OOPHERECTOMY  01/30/14   with lymph node biopsy   TOTAL HIP ARTHROPLASTY Left 04/17/2022   Procedure: TOTAL HIP ARTHROPLASTY ANTERIOR APPROACH;  Surgeon: Fidel Rogue, MD;  Location: [facility] OR;  Service: Orthopedics;  Laterality: Left;   TOTAL KNEE ARTHROPLASTY     right   TOTAL KNEE REVISION Right 01/04/2019   Procedure: TOTAL KNEE REVISION;  Surgeon: Melodi Lerner, MD;  Location: [facility];  Service: Orthopedics;  Laterality: Right;  with block      reports that she has never smoked. She has never used smokeless tobacco. She reports that she does not drink alcohol  and does not use drugs. Social History   Socioeconomic History   Marital status: Married    Spouse name: Not on file   Number of children: 3    Years of education: Not on file   Highest education level: High school graduate  Occupational History   Occupation: materials engineer  Tobacco Use   Smoking status: Never   Smokeless tobacco: Never  Vaping Use   Vaping status: Never Used  Substance and Sexual Activity   Alcohol  use: Never   Drug use: Never   Sexual activity: Not Currently  Other Topics Concern   Not on file  Social History Narrative   Lives at home with husband, Zell.  Four grand and five great grands.     Caffeine: diet coke about 1-2 per day    Right handed   Social Drivers of Health   Tobacco Use: Low Risk (08/29/2024)   Patient History    Smoking Tobacco Use: Never    Smokeless Tobacco Use: Never    Passive Exposure: Not on file  Financial Resource Strain: Not on file  Food Insecurity: No Food Insecurity (07/04/2024)   Epic    Worried About Programme Researcher, Broadcasting/film/video in the Last Year: Never true    Ran Out of Food in the Last Year: Never true  Transportation Needs: No Transportation Needs (07/04/2024)   Epic    Lack of Transportation (Medical): No    Lack of Transportation (Non-Medical): No  Physical Activity: Not on file  Stress: Not on file  Social Connections: Moderately Integrated (07/04/2024)   Social Connection and Isolation Panel    Frequency of Communication with Friends and Family: Once a week    Frequency of Social Gatherings with Friends and Family: More than three times a week    Attends Religious Services: More than 4 times per year    Active Member of [employer] or Organizations: No    Attends Banker Meetings: Never    Marital Status: Married  Catering Manager Violence: Not At Risk (07/04/2024)   Epic    Fear of Current or Ex-Partner: No    Emotionally Abused: No    Physically Abused: No    Sexually Abused: No  Depression (PHQ2-9): Not on file  Alcohol  Screen: Not on file  Housing: Low Risk (07/04/2024)   Epic    Unable to Pay for Housing in the Last Year: No    Number of  Times Moved in the Last Year: 0    Homeless in the Last Year: No  Utilities: Not At Risk (07/04/2024)   Epic    Threatened with loss of utilities: No  Health Literacy:  Not on file   Functional Status Survey:    Allergies[2]  Pertinent  Health Maintenance Due  Topic Date Due   Influenza Vaccine  Completed   Bone Density Scan  Completed   Mammogram  Discontinued    Medications: Allergies as of 08/29/2024       Reactions   Percocet [oxycodone -acetaminophen ] Nausea And Vomiting        Medication List        Accurate as of August 29, 2024 11:59 PM. If you have any questions, ask your nurse or doctor.          STOP taking these medications    Citracal +D3 Tabs Stopped by: Clova Morlock, NP   docusate sodium  100 MG capsule Commonly known as: COLACE Stopped by: Quierra Silverio, NP   oxyCODONE  5 MG immediate release tablet Commonly known as: Oxy IR/ROXICODONE  Stopped by: Rosalie Gelpi, NP       TAKE these medications    acetaminophen  500 MG tablet Commonly known as: TYLENOL  Take 2 tablets (1,000 mg total) by mouth every 8 (eight) hours.   calcium  carbonate 1500 (600 Ca) MG Tabs tablet Commonly known as: OSCAL Take 1,500 mg by mouth daily.   donepezil  10 MG tablet Commonly known as: ARICEPT  Take 1 tablet (10 mg total) by mouth at bedtime.   furosemide  20 MG tablet Commonly known as: LASIX  Take 1 tablet (20 mg total) by mouth daily as needed for fluid or edema.   hydrochlorothiazide  12.5 MG tablet Commonly known as: HYDRODIURIL  Take 1 tablet (12.5 mg total) by mouth every morning.   losartan  25 MG tablet Commonly known as: COZAAR  Take 1 tablet (25 mg total) by mouth daily.   magnesium  gluconate 500 (27 Mg) MG Tabs tablet Commonly known as: MAGONATE Take 500 mg by mouth daily.   polyethylene glycol 17 g packet Commonly known as: MIRALAX  / GLYCOLAX  Take 17 g by mouth daily as needed for mild constipation.   Synthroid  100 MCG tablet Generic drug:  levothyroxine  Take 1 tablet (100 mcg total) by mouth daily before breakfast. What changed: See the new instructions. Changed by: Ellisyn Icenhower, NP   VITAMIN D3 PO Take 2,000 Int'l Units by mouth daily.        Review of Systems  Constitutional:  Negative for activity change, appetite change and fatigue.  HENT:  Negative for congestion and trouble swallowing.   Eyes:  Negative for visual disturbance.  Respiratory:  Negative for cough, shortness of breath and wheezing.   Cardiovascular:  Positive for leg swelling. Negative for chest pain and palpitations.  Gastrointestinal:  Negative for abdominal pain and constipation.  Genitourinary:  Negative for dysuria, frequency and urgency.  Musculoskeletal:  Positive for arthralgias, back pain, gait problem and joint swelling.  Skin:  Negative for color change.  Neurological:  Negative for speech difficulty and headaches.       Memory lapses.   Psychiatric/Behavioral:  Negative for confusion and sleep disturbance. The patient is not nervous/anxious.     Vitals:   08/29/24 1220  BP: (!) 126/58  Pulse: 76  Resp: 17  Temp: (!) 97.2 F (36.2 C)  SpO2: 96%  Weight: 182 lb (82.6 kg)  Height: 5' 4 (1.626 m)   Body mass index is 31.24 kg/m. Physical Exam Vitals and nursing note reviewed.  Constitutional:      Appearance: Normal appearance. She is well-developed.  HENT:     Head: Normocephalic and atraumatic.     Nose: Nose normal.  Mouth/Throat:     Mouth: Mucous membranes are moist.  Eyes:     Extraocular Movements: Extraocular movements intact.     Conjunctiva/sclera: Conjunctivae normal.     Pupils: Pupils are equal, round, and reactive to light.  Cardiovascular:     Rate and Rhythm: Normal rate and regular rhythm.     Heart sounds: No murmur heard. Pulmonary:     Effort: Pulmonary effort is normal.     Breath sounds: No wheezing or rales.  Abdominal:     General: Bowel sounds are normal.     Palpations: Abdomen is soft.      Tenderness: There is no abdominal tenderness. There is no right CVA tenderness, guarding or rebound.  Musculoskeletal:        General: Normal range of motion.     Cervical back: Normal range of motion and neck supple.     Right lower leg: Edema present.     Left lower leg: Edema present.     Comments: BLE edema, R>L  Skin:    General: Skin is warm and dry.     Comments: Left hip surgical scar R hip surgical scar  Neurological:     General: No focal deficit present.     Mental Status: She is alert and oriented to person, place, and time.     Motor: No weakness.     Gait: Gait abnormal.  Psychiatric:        Mood and Affect: Mood normal.        Behavior: Behavior normal.        Thought Content: Thought content normal.     Labs reviewed: Basic Metabolic Panel: Recent Labs    07/05/24 0335 07/06/24 0030 07/08/24 0914 07/08/24 2354 07/13/24 0000  NA 143 142 141 139 140  K 3.7 4.1 3.7 3.9 4.0  CL 105 106 105 104 106  CO2 28 27 29 25   --   GLUCOSE 137* 161* 146* 130*  --   BUN 26* 22 35* 27* 35*  CREATININE 1.12* 1.06* 1.01* 0.79  --   CALCIUM  9.3 8.7* 8.6* 8.5*  --   MG 2.1 1.9  --   --   --   PHOS 4.2 2.8  --   --   --    Liver Function Tests: No results for input(s): AST, ALT, ALKPHOS, BILITOT, PROT, ALBUMIN in the last 8760 hours. No results for input(s): LIPASE, AMYLASE in the last 8760 hours. Recent Labs    07/06/24 1906  AMMONIA 17   CBC: Recent Labs    03/11/24 1957 07/04/24 2004 07/05/24 0335 07/06/24 0030 07/07/24 0343 07/08/24 0914 07/08/24 2354  WBC 13.6* 13.9* 12.5*   < > 13.1* 8.2 9.2  NEUTROABS 11.0* 12.1* 10.1*  --   --   --   --   HGB 15.0 13.5 11.6*   < > 8.9* 9.4* 10.4*  HCT 45.4 41.2 35.6*   < > 27.6* 29.0* 32.3*  MCV 93.4 92.4 92.0   < > 92.9 95.1 94.4  PLT 221 170 167   < > 136* 157 196   < > = values in this interval not displayed.   Cardiac Enzymes: No results for input(s): CKTOTAL, CKMB, CKMBINDEX,  TROPONINI in the last 8760 hours. BNP: Invalid input(s): POCBNP CBG: Recent Labs    07/10/24 0529 07/10/24 0754 07/10/24 1200  GLUCAP 113* 107* 129*    Procedures and Imaging Studies During Stay: No results found.  Assessment/Plan:   Arthralgia of hip  or thigh Hospitalized 07/04/2024 to 07/10/2024.  Post right hip fracture sustained from a mechanical fall, IM ORIF 07/05/2024, aspirin  81 mg twice daily for PE/DVT prophylaxis for 6 weeks, as needed oxycodone  available to her for pain  The patient has recovered from R hip surgery, ambulates with walker, regained her physical strength and her baseline ADL function. She is stable to discharge home with her husband and personal care assistant to continue therapy at home.   Heart failure with improved ejection fraction (HFimpEF) (HCC) compensated clinically, taking furosemide  as needed  and hydrochlorothiazide  daily  Benign essential HTN Blood pressure is controlled, continue Losartan   Hypothyroidism taking levothyroxine , TSH 0.477 07/06/24  Major neurocognitive disorder (HCC)  taking donepezil , no behavioral issues. MMSE 13/30 07/12/24  Anemia post op, Hgb 9.9 07/13/24  Vitamin D  deficiency taking vitamin D  supplement, Vit D 61 07/13/24  Slow transit constipation  stable,  taking MiraLAX  as needed  HLD (hyperlipidemia) LDL 75 07/13/24     Patient is being discharged with the following home health services:    Patient is being discharged with the following durable medical equipment:    Patient has been advised to f/u with their PCP in 1-2 weeks to bring them up to date on their rehab stay.  Social services at facility was responsible for arranging this appointment.  Pt was provided with a 30 day supply of prescriptions for medications and refills must be obtained from their PCP.  For controlled substances, a more limited supply may be provided adequate until PCP appointment only.  Future labs/tests needed:  prn        [1]  Allergies Allergen Reactions   Percocet [Oxycodone -Acetaminophen ] Nausea And Vomiting  [2]  Allergies Allergen Reactions   Percocet [Oxycodone -Acetaminophen ] Nausea And Vomiting   "

## 2024-08-29 NOTE — Assessment & Plan Note (Signed)
 post op, Hgb 9.9 07/13/24

## 2024-08-29 NOTE — Assessment & Plan Note (Signed)
"   stable,  taking MiraLAX  as needed "

## 2024-08-29 NOTE — Assessment & Plan Note (Signed)
"   taking donepezil , no behavioral issues. MMSE 13/30 07/12/24 "

## 2024-08-29 NOTE — Assessment & Plan Note (Signed)
 LDL 75 07/13/24

## 2024-08-29 NOTE — Assessment & Plan Note (Signed)
 taking levothyroxine , TSH 0.477 07/06/24

## 2024-08-29 NOTE — Assessment & Plan Note (Signed)
 Hospitalized 07/04/2024 to 07/10/2024.  Post right hip fracture sustained from a mechanical fall, IM ORIF 07/05/2024, aspirin  81 mg twice daily for PE/DVT prophylaxis for 6 weeks, as needed oxycodone  available to her for pain  The patient has recovered from R hip surgery, ambulates with walker, regained her physical strength and her baseline ADL function. She is stable to discharge home with her husband and personal care assistant to continue therapy at home.

## 2024-08-29 NOTE — Assessment & Plan Note (Signed)
 compensated clinically, taking furosemide  as needed  and hydrochlorothiazide  daily

## 2024-08-30 ENCOUNTER — Encounter: Payer: Self-pay | Admitting: Nurse Practitioner
